# Patient Record
Sex: Female | Born: 1935 | Race: Black or African American | Hispanic: No | Smoking: Never smoker
Health system: Southern US, Community
[De-identification: ages and names within clinical notes are randomized; demographics above are authoritative.]

## PROBLEM LIST (undated history)

## (undated) DIAGNOSIS — I10 Essential (primary) hypertension: Secondary | ICD-10-CM

## (undated) DIAGNOSIS — F0390 Unspecified dementia without behavioral disturbance: Secondary | ICD-10-CM

## (undated) DIAGNOSIS — E785 Hyperlipidemia, unspecified: Secondary | ICD-10-CM

## (undated) HISTORY — PX: CHOLECYSTECTOMY: SHX55

## (undated) HISTORY — PX: APPENDECTOMY: SHX54

---

## 2012-06-15 ENCOUNTER — Inpatient Hospital Stay (HOSPITAL_COMMUNITY)
Admission: EM | Admit: 2012-06-15 | Discharge: 2012-06-23 | DRG: 871 | Disposition: A | Discharge status: Skilled Nursing Facility | Payer: Medicare Other | Attending: Family Medicine | Admitting: Family Medicine

## 2012-06-15 ENCOUNTER — Emergency Department (HOSPITAL_COMMUNITY): Payer: Medicare Other

## 2012-06-15 ENCOUNTER — Encounter (HOSPITAL_COMMUNITY): Payer: Self-pay | Admitting: *Deleted

## 2012-06-15 DIAGNOSIS — E872 Acidosis, unspecified: Secondary | ICD-10-CM | POA: Diagnosis present

## 2012-06-15 DIAGNOSIS — R5381 Other malaise: Secondary | ICD-10-CM | POA: Diagnosis present

## 2012-06-15 DIAGNOSIS — I10 Essential (primary) hypertension: Secondary | ICD-10-CM | POA: Diagnosis present

## 2012-06-15 DIAGNOSIS — R6520 Severe sepsis without septic shock: Secondary | ICD-10-CM | POA: Diagnosis present

## 2012-06-15 DIAGNOSIS — R7881 Bacteremia: Secondary | ICD-10-CM | POA: Diagnosis present

## 2012-06-15 DIAGNOSIS — A419 Sepsis, unspecified organism: Principal | ICD-10-CM | POA: Diagnosis present

## 2012-06-15 DIAGNOSIS — I959 Hypotension, unspecified: Secondary | ICD-10-CM | POA: Diagnosis present

## 2012-06-15 DIAGNOSIS — D696 Thrombocytopenia, unspecified: Secondary | ICD-10-CM | POA: Diagnosis present

## 2012-06-15 DIAGNOSIS — E119 Type 2 diabetes mellitus without complications: Secondary | ICD-10-CM

## 2012-06-15 DIAGNOSIS — N179 Acute kidney failure, unspecified: Secondary | ICD-10-CM | POA: Diagnosis present

## 2012-06-15 DIAGNOSIS — R109 Unspecified abdominal pain: Secondary | ICD-10-CM | POA: Diagnosis present

## 2012-06-15 DIAGNOSIS — D649 Anemia, unspecified: Secondary | ICD-10-CM | POA: Diagnosis present

## 2012-06-15 DIAGNOSIS — N390 Urinary tract infection, site not specified: Secondary | ICD-10-CM | POA: Diagnosis present

## 2012-06-15 DIAGNOSIS — D509 Iron deficiency anemia, unspecified: Secondary | ICD-10-CM | POA: Diagnosis present

## 2012-06-15 DIAGNOSIS — E869 Volume depletion, unspecified: Secondary | ICD-10-CM | POA: Diagnosis present

## 2012-06-15 DIAGNOSIS — E785 Hyperlipidemia, unspecified: Secondary | ICD-10-CM | POA: Diagnosis present

## 2012-06-15 DIAGNOSIS — A498 Other bacterial infections of unspecified site: Secondary | ICD-10-CM | POA: Diagnosis present

## 2012-06-15 DIAGNOSIS — R112 Nausea with vomiting, unspecified: Secondary | ICD-10-CM

## 2012-06-15 DIAGNOSIS — M79609 Pain in unspecified limb: Secondary | ICD-10-CM | POA: Diagnosis present

## 2012-06-15 DIAGNOSIS — E1169 Type 2 diabetes mellitus with other specified complication: Secondary | ICD-10-CM | POA: Diagnosis present

## 2012-06-15 DIAGNOSIS — R7989 Other specified abnormal findings of blood chemistry: Secondary | ICD-10-CM | POA: Diagnosis present

## 2012-06-15 HISTORY — DX: Hyperlipidemia, unspecified: E78.5

## 2012-06-15 HISTORY — DX: Essential (primary) hypertension: I10

## 2012-06-15 LAB — COMPREHENSIVE METABOLIC PANEL
ALT: 253 U/L — ABNORMAL HIGH (ref 0–35)
AST: 539 U/L — ABNORMAL HIGH (ref 0–37)
Albumin: 3.6 g/dL (ref 3.5–5.2)
Alkaline Phosphatase: 187 U/L — ABNORMAL HIGH (ref 39–117)
Calcium: 9.4 mg/dL (ref 8.4–10.5)
GFR calc Af Amer: 25 mL/min — ABNORMAL LOW (ref >90)
Glucose, Bld: 76 mg/dL (ref 70–99)
Potassium: 4.4 mEq/L (ref 3.5–5.1)
Sodium: 136 mEq/L (ref 135–145)
Total Protein: 8.1 g/dL (ref 6.0–8.3)

## 2012-06-15 LAB — URINALYSIS, MICROSCOPIC ONLY
Glucose, UA: NEGATIVE mg/dL
Hgb urine dipstick: NEGATIVE
Protein, ur: NEGATIVE mg/dL
Specific Gravity, Urine: 1.015 (ref 1.005–1.030)

## 2012-06-15 LAB — CBC WITH DIFFERENTIAL/PLATELET
Basophils Absolute: 0 10*3/uL (ref 0.0–0.1)
Basophils Relative: 0 % (ref 0–1)
Eosinophils Absolute: 0.1 10*3/uL (ref 0.0–0.7)
Hemoglobin: 11.6 g/dL — ABNORMAL LOW (ref 12.0–15.0)
Lymphocytes Relative: 5 % — ABNORMAL LOW (ref 12–46)
MCH: 28.9 pg (ref 26.0–34.0)
MCHC: 31.3 g/dL (ref 30.0–36.0)
Monocytes Absolute: 0.2 10*3/uL (ref 0.1–1.0)
Neutrophils Relative %: 91 % — ABNORMAL HIGH (ref 43–77)
Platelets: 130 10*3/uL — ABNORMAL LOW (ref 150–400)

## 2012-06-15 LAB — GLUCOSE, CAPILLARY: Glucose-Capillary: 65 mg/dL — ABNORMAL LOW (ref 70–99)

## 2012-06-15 MED ORDER — SODIUM CHLORIDE 0.9 % IV BOLUS (SEPSIS)
1000.0000 mL | Freq: Once | INTRAVENOUS | Status: AC
  Administered 2012-06-15: 1000 mL via INTRAVENOUS

## 2012-06-15 MED ORDER — ONDANSETRON HCL 4 MG/2ML IJ SOLN
4.0000 mg | Freq: Once | INTRAMUSCULAR | Status: AC
  Administered 2012-06-15: 4 mg via INTRAVENOUS
  Filled 2012-06-15: qty 2

## 2012-06-15 MED ORDER — ACETAMINOPHEN 325 MG PO TABS
650.0000 mg | ORAL_TABLET | Freq: Once | ORAL | Status: AC
  Administered 2012-06-15: 650 mg via ORAL
  Filled 2012-06-15: qty 2

## 2012-06-15 NOTE — ED Notes (Signed)
Provider aware of pt CBG of 65. Pt asymptomatic. Offered PO intake. Refused at this time.

## 2012-06-15 NOTE — ED Notes (Signed)
Pt arrives via [facility] from home with c/o "feeling sick". Reports nausea, vomiting, chills and generalized weakness x 2 hours.

## 2012-06-15 NOTE — ED Notes (Signed)
Repeat cbg 93

## 2012-06-15 NOTE — ED Provider Notes (Signed)
History     CSN: [id number]  Arrival date & time 06/15/12  1956   First MD Initiated Contact with Patient 06/15/12 2009      Chief Complaint  Patient presents with  . Generalized Body Aches    (Consider location/radiation/quality/duration/timing/severity/associated sxs/prior treatment) Patient is a 78 y.o. female presenting with vomiting. The history is provided by the patient.  Emesis  This is a new problem. The current episode started 12 to 24 hours ago. The problem occurs 2 to 4 times per day. The problem has not changed since onset.The emesis has an appearance of stomach contents. There has been no fever. Pertinent negatives include no abdominal pain, no chills, no cough, no diarrhea, no fever, no headaches and no sweats.    Past Medical History  Diagnosis Date  . Diabetes mellitus   . Hypertension     History reviewed. No pertinent past surgical history.  History reviewed. No pertinent family history.  History  Substance Use Topics  . Smoking status: Not on file  . Smokeless tobacco: Not on file  . Alcohol Use:     OB History    Grav Para Term Preterm Abortions TAB SAB Ect Mult Living                  Review of Systems  Constitutional: Negative for fever and chills.  HENT: Positive for rhinorrhea. Negative for congestion.   Respiratory: Positive for shortness of breath (worse with exertion). Negative for cough.   Cardiovascular: Negative for chest pain and leg swelling.  Gastrointestinal: Positive for nausea and vomiting. Negative for abdominal pain, diarrhea and constipation.  Genitourinary: Negative for urgency, decreased urine volume and difficulty urinating.  Musculoskeletal: Negative.  Negative for back pain.  Skin: Negative.   Neurological: Negative for light-headedness and headaches.  Psychiatric/Behavioral: Negative for confusion.  All other systems reviewed and are negative.    Allergies  Review of patient's allergies indicates no known  allergies.  Home Medications  No current outpatient prescriptions on file.  BP 107/59  Temp 98.9 F (37.2 C) (Oral)  Resp 18  SpO2 98%  Physical Exam  Nursing note and vitals reviewed. Constitutional: She is oriented to person, place, and time. She appears well-developed and well-nourished. No distress.  HENT:  Head: Normocephalic and atraumatic.  Right Ear: External ear normal.  Left Ear: External ear normal.  Nose: Nose normal.  Mouth/Throat: Oropharynx is clear and moist.  Neck: Neck supple.  Cardiovascular: Normal rate, regular rhythm, normal heart sounds and intact distal pulses.   Pulmonary/Chest: Effort normal and breath sounds normal. No respiratory distress. She has no wheezes. She has no rales.  Abdominal: Soft. She exhibits no distension. There is no tenderness.  Musculoskeletal: She exhibits no edema and no tenderness.  Lymphadenopathy:    She has no cervical adenopathy.  Neurological: She is alert and oriented to person, place, and time.  Skin: Skin is warm and dry. She is not diaphoretic. No pallor.    ED Course  Procedures (including critical care time)  Labs Reviewed  CBC WITH DIFFERENTIAL - Abnormal; Notable for the following:    Hemoglobin 11.6 (*)     RDW 16.3 (*)     Platelets 130 (*)  PLATELET COUNT CONFIRMED BY SMEAR   Neutrophils Relative 91 (*)     Lymphocytes Relative 5 (*)     Lymphs Abs 0.3 (*)     All other components within normal limits  COMPREHENSIVE METABOLIC PANEL - Abnormal; Notable for the  following:    CO2 17 (*)     Creatinine, Ser 2.07 (*)     AST 539 (*)     ALT 253 (*)     Alkaline Phosphatase 187 (*)     Total Bilirubin 2.1 (*)     GFR calc non Af Amer 21 (*)     GFR calc Af Amer 25 (*)     All other components within normal limits  LIPASE, BLOOD - Abnormal; Notable for the following:    Lipase 88 (*)     All other components within normal limits  URINALYSIS, WITH MICROSCOPIC - Abnormal; Notable for the following:     Color, Urine AMBER (*)  BIOCHEMICALS MAY BE AFFECTED BY COLOR   APPearance CLOUDY (*)     Bilirubin Urine SMALL (*)     Urobilinogen, UA 4.0 (*)     Leukocytes, UA TRACE (*)     Bacteria, UA MANY (*)     All other components within normal limits  GLUCOSE, CAPILLARY - Abnormal; Notable for the following:    Glucose-Capillary 65 (*)     All other components within normal limits  TROPONIN I  GLUCOSE, CAPILLARY   Dg Chest 2 View  06/15/2012  *RADIOLOGY REPORT*  Clinical Data: Generalized body aches  CHEST - 2 VIEW  Comparison: None.  Findings: The lung volumes are low.  There is enlargement of central pulmonary vessels probably as a result of low lung volumes. There is no focal pneumonia or pleural effusion.  The aorta is uncoiled.  The heart size is enlarged.  The soft tissue osseous structures are unremarkable.  IMPRESSION: Low lung volumes.  Cardiomegaly.  No focal pneumonia.  Original Report Authenticated By: Abelardo Diesel, M.D.   US Abdomen Complete  06/16/2012  *RADIOLOGY REPORT*  Clinical Data:  Vomiting and mid epigastric pain.  COMPLETE ABDOMINAL ULTRASOUND  Comparison:  None.  Findings:  Gallbladder:  Surgical absence of the gallbladder.  Common bile duct:  Mild prominence of bile ducts with diameter measured at 9 mm.  This is likely normal in a postoperative patient.  Liver:  Somewhat limited visualization of the liver.  Segmental images are available demonstrating no focal lesion and normal parenchymal echotexture.  IVC:  Appears normal.  Pancreas:  Pancreas is obscured by overlying bowel gas is not well visualized.  Spleen:  Spleen length measures about 11 cm.  Normal parenchymal echotexture.  Right Kidney:  Right kidney measures 11.4 cm length.  No hydronephrosis.  Left Kidney:  Left kidney measures 12 cm length.  No hydronephrosis.  Abdominal aorta:  No aneurysm identified.  IMPRESSION: Somewhat technically limited study due to patient body habitus and bowel gas.  Surgical absence of the  gallbladder.  Mild physiologic dilatation of bile ducts.  No acute process suggested.  Original Report Authenticated By: Neale Burly, M.D.     Date: 06/15/2012  Rate: 106  Rhythm: sinus tachycardia  QRS Axis: normal  Intervals: normal  ST/T Wave abnormalities: normal  Conduction Disutrbances:none  Narrative Interpretation:   Old EKG Reviewed: none available   1. Nausea & vomiting   2. Transaminitis   3. Sepsis       MDM  78 yo female with N/V since this AM and exertional dyspnea. Mild epigastric tenderness, RUQ U/S shows no acute issues. Has elevated LFTs with mild alk phos and bili. Unsure if this bile duct stone, which could cause these symptoms and mild lipase elevation. EKG shows no ischemia and trop is  neg, but with dyspnea cardiac cause is on differential. Will admit to step down due to borderline BP with mild tachycardia. Given fluids in ED, and based on bacteria on U/A with mild temp will give rocephin after cultures.     Sherwood Gambler, MD 06/16/12 [phone]

## 2012-06-15 NOTE — ED Notes (Signed)
Urine specimen obtained with in/out cath using sterile procedure. Pt verbalized understanding of procedure and tolerated well

## 2012-06-15 NOTE — ED Notes (Signed)
Pt to US.

## 2012-06-15 NOTE — ED Notes (Signed)
Pt out to xray.

## 2012-06-16 ENCOUNTER — Inpatient Hospital Stay (HOSPITAL_COMMUNITY): Payer: Medicare Other

## 2012-06-16 ENCOUNTER — Encounter (HOSPITAL_COMMUNITY): Payer: Self-pay | Admitting: Internal Medicine

## 2012-06-16 DIAGNOSIS — E1100 Type 2 diabetes mellitus with hyperosmolarity without nonketotic hyperglycemic-hyperosmolar coma (NKHHC): Secondary | ICD-10-CM | POA: Insufficient documentation

## 2012-06-16 DIAGNOSIS — E869 Volume depletion, unspecified: Secondary | ICD-10-CM | POA: Diagnosis present

## 2012-06-16 DIAGNOSIS — A419 Sepsis, unspecified organism: Secondary | ICD-10-CM | POA: Diagnosis present

## 2012-06-16 DIAGNOSIS — R112 Nausea with vomiting, unspecified: Secondary | ICD-10-CM

## 2012-06-16 DIAGNOSIS — R109 Unspecified abdominal pain: Secondary | ICD-10-CM | POA: Diagnosis present

## 2012-06-16 DIAGNOSIS — E872 Acidosis: Secondary | ICD-10-CM | POA: Diagnosis present

## 2012-06-16 DIAGNOSIS — I10 Essential (primary) hypertension: Secondary | ICD-10-CM | POA: Diagnosis present

## 2012-06-16 DIAGNOSIS — N179 Acute kidney failure, unspecified: Secondary | ICD-10-CM | POA: Diagnosis present

## 2012-06-16 DIAGNOSIS — E785 Hyperlipidemia, unspecified: Secondary | ICD-10-CM | POA: Diagnosis present

## 2012-06-16 DIAGNOSIS — R7989 Other specified abnormal findings of blood chemistry: Secondary | ICD-10-CM

## 2012-06-16 DIAGNOSIS — E119 Type 2 diabetes mellitus without complications: Secondary | ICD-10-CM | POA: Diagnosis present

## 2012-06-16 DIAGNOSIS — I959 Hypotension, unspecified: Secondary | ICD-10-CM | POA: Diagnosis present

## 2012-06-16 DIAGNOSIS — N390 Urinary tract infection, site not specified: Secondary | ICD-10-CM | POA: Diagnosis present

## 2012-06-16 LAB — TROPONIN I: Troponin I: <0.3 ng/mL (ref <0.30)

## 2012-06-16 LAB — COMPREHENSIVE METABOLIC PANEL
ALT: 212 U/L — ABNORMAL HIGH (ref 0–35)
Alkaline Phosphatase: 119 U/L — ABNORMAL HIGH (ref 39–117)
BUN: 23 mg/dL (ref 6–23)
CO2: 20 mEq/L (ref 19–32)
GFR calc Af Amer: 22 mL/min — ABNORMAL LOW (ref >90)
GFR calc non Af Amer: 19 mL/min — ABNORMAL LOW (ref >90)
Glucose, Bld: 60 mg/dL — ABNORMAL LOW (ref 70–99)
Potassium: 4.2 mEq/L (ref 3.5–5.1)
Sodium: 141 mEq/L (ref 135–145)
Total Bilirubin: 2.9 mg/dL — ABNORMAL HIGH (ref 0.3–1.2)
Total Protein: 6.4 g/dL (ref 6.0–8.3)

## 2012-06-16 LAB — GLUCOSE, CAPILLARY
Glucose-Capillary: 132 mg/dL — ABNORMAL HIGH (ref 70–99)
Glucose-Capillary: 58 mg/dL — ABNORMAL LOW (ref 70–99)
Glucose-Capillary: 70 mg/dL (ref 70–99)

## 2012-06-16 LAB — PROTIME-INR
INR: 1.05 (ref 0.00–1.49)
INR: 1.09 (ref 0.00–1.49)
Prothrombin Time: 13.9 seconds (ref 11.6–15.2)
Prothrombin Time: 14.3 seconds (ref 11.6–15.2)

## 2012-06-16 LAB — CBC
HCT: 26.3 % — ABNORMAL LOW (ref 36.0–46.0)
MCHC: 32.7 g/dL (ref 30.0–36.0)
Platelets: 152 10*3/uL (ref 150–400)
RDW: 16.2 % — ABNORMAL HIGH (ref 11.5–15.5)
WBC: 19 10*3/uL — ABNORMAL HIGH (ref 4.0–10.5)

## 2012-06-16 LAB — CARDIAC PANEL(CRET KIN+CKTOT+MB+TROPI)
Relative Index: INVALID (ref 0.0–2.5)
Total CK: 59 U/L (ref 7–177)
Troponin I: <0.3 ng/mL (ref <0.30)

## 2012-06-16 LAB — HEMOGLOBIN A1C
Hgb A1c MFr Bld: 5.6 % (ref <5.7)
Mean Plasma Glucose: 114 mg/dL (ref <117)

## 2012-06-16 LAB — HEPATITIS PANEL, ACUTE: HCV Ab: NEGATIVE

## 2012-06-16 MED ORDER — BIOTENE DRY MOUTH MT LIQD
15.0000 mL | Freq: Two times a day (BID) | OROMUCOSAL | Status: DC
  Administered 2012-06-16 – 2012-06-23 (×14): 15 mL via OROMUCOSAL

## 2012-06-16 MED ORDER — DEXTROSE 5 % IV SOLN
1.0000 g | Freq: Once | INTRAVENOUS | Status: AC
  Administered 2012-06-16: 1 g via INTRAVENOUS
  Filled 2012-06-16: qty 10

## 2012-06-16 MED ORDER — VANCOMYCIN HCL 1000 MG IV SOLR
2000.0000 mg | Freq: Once | INTRAVENOUS | Status: AC
  Administered 2012-06-16: 2000 mg via INTRAVENOUS
  Filled 2012-06-16: qty 2000

## 2012-06-16 MED ORDER — PIPERACILLIN-TAZOBACTAM 3.375 G IVPB
3.3750 g | Freq: Three times a day (TID) | INTRAVENOUS | Status: DC
  Administered 2012-06-16 – 2012-06-17 (×2): 3.375 g via INTRAVENOUS
  Filled 2012-06-16 (×3): qty 50

## 2012-06-16 MED ORDER — INSULIN ASPART 100 UNIT/ML ~~LOC~~ SOLN
0.0000 [IU] | Freq: Three times a day (TID) | SUBCUTANEOUS | Status: DC
  Administered 2012-06-16 – 2012-06-18 (×3): 1 [IU] via SUBCUTANEOUS

## 2012-06-16 MED ORDER — DEXTROSE-NACL 5-0.9 % IV SOLN
INTRAVENOUS | Status: DC
  Administered 2012-06-16 – 2012-06-17 (×2): via INTRAVENOUS
  Administered 2012-06-17: 100 mL/h via INTRAVENOUS
  Administered 2012-06-17: 04:00:00 via INTRAVENOUS
  Administered 2012-06-17 – 2012-06-18 (×2): 75 mL/h via INTRAVENOUS

## 2012-06-16 MED ORDER — SODIUM CHLORIDE 0.9 % IV BOLUS (SEPSIS)
1000.0000 mL | Freq: Once | INTRAVENOUS | Status: AC
  Administered 2012-06-16: 1000 mL via INTRAVENOUS

## 2012-06-16 MED ORDER — PIPERACILLIN-TAZOBACTAM 3.375 G IVPB
3.3750 g | Freq: Once | INTRAVENOUS | Status: AC
  Administered 2012-06-16: 3.375 g via INTRAVENOUS
  Filled 2012-06-16: qty 50

## 2012-06-16 MED ORDER — ONDANSETRON HCL 4 MG PO TABS: 4.0000 mg | ORAL_TABLET | Freq: Four times a day (QID) | ORAL | Status: DC | PRN

## 2012-06-16 MED ORDER — SODIUM CHLORIDE 0.9 % IV BOLUS (SEPSIS)
500.0000 mL | Freq: Once | INTRAVENOUS | Status: AC
  Administered 2012-06-16: 500 mL via INTRAVENOUS

## 2012-06-16 MED ORDER — IOHEXOL 300 MG/ML  SOLN
20.0000 mL | INTRAMUSCULAR | Status: AC
  Administered 2012-06-16 (×2): 20 mL via ORAL

## 2012-06-16 MED ORDER — CHLORHEXIDINE GLUCONATE 0.12 % MT SOLN
15.0000 mL | Freq: Two times a day (BID) | OROMUCOSAL | Status: DC
  Administered 2012-06-16 – 2012-06-19 (×7): 15 mL via OROMUCOSAL
  Filled 2012-06-16 (×10): qty 15

## 2012-06-16 MED ORDER — ONDANSETRON HCL 4 MG/2ML IJ SOLN: 4.0000 mg | Freq: Four times a day (QID) | INTRAMUSCULAR | Status: DC | PRN

## 2012-06-16 MED ORDER — DEXTROSE 5 % IV SOLN
INTRAVENOUS | Status: DC
  Administered 2012-06-16 (×2): via INTRAVENOUS

## 2012-06-16 MED ORDER — VANCOMYCIN HCL 1000 MG IV SOLR
1500.0000 mg | INTRAVENOUS | Status: DC
  Filled 2012-06-16: qty 1500

## 2012-06-16 MED ORDER — SODIUM CHLORIDE 0.9 % IJ SOLN: 3.0000 mL | Freq: Two times a day (BID) | INTRAMUSCULAR | Status: DC

## 2012-06-16 MED ORDER — DEXTROSE 5 % IV SOLN
1.0000 g | INTRAVENOUS | Status: DC
  Filled 2012-06-16: qty 10

## 2012-06-16 MED ORDER — LORAZEPAM 2 MG/ML IJ SOLN
0.5000 mg | Freq: Four times a day (QID) | INTRAMUSCULAR | Status: DC | PRN
  Filled 2012-06-16: qty 1

## 2012-06-16 MED ORDER — SODIUM CHLORIDE 0.9 % IV SOLN
INTRAVENOUS | Status: DC
  Administered 2012-06-16 (×2): via INTRAVENOUS

## 2012-06-16 MED ORDER — MORPHINE SULFATE 2 MG/ML IJ SOLN
1.0000 mg | INTRAMUSCULAR | Status: DC | PRN
  Administered 2012-06-16: 2 mg via INTRAVENOUS
  Filled 2012-06-16: qty 1

## 2012-06-16 MED ORDER — ACETAMINOPHEN 325 MG PO TABS
650.0000 mg | ORAL_TABLET | Freq: Four times a day (QID) | ORAL | Status: DC | PRN
  Administered 2012-06-18: 650 mg via ORAL
  Filled 2012-06-16: qty 2

## 2012-06-16 MED ORDER — PNEUMOCOCCAL 13-VAL CONJ VACC IM SUSP
0.5000 mL | INTRAMUSCULAR | Status: AC
  Administered 2012-06-17: 0.5 mL via INTRAMUSCULAR
  Filled 2012-06-16: qty 0.5

## 2012-06-16 NOTE — H&P (Signed)
[name] [name] is an 78 y.o. female.   Patient seen and examined on June 16, 2012 at 1:53 AM. PCP - patient states she sees a doctor in [place]. Chief Complaint: Nausea vomiting and abdominal pain. HPI: 77 years old female with known history of diabetes mellitus hypertension and hyperlipidemia presents with complaints of nausea vomiting fever and chills with abdominal pain since morning. Patient states she threw up at least 3 times and denies any blood in the vomitus. The pain is around the epigastric and periumbilical area. Pain is dull aching and persistent. Denies any diarrhea. Denies any chest pain but does have mild shortness of breath. In the ER LFTs are found to be elevated and sonogram does not show anything acute. Patient does have history of cholecystectomy. Patient was found to be febrile afebrile and also found to have possible UTI. Patient's lipase is also mildly elevated. Initially the ER patient was found to be mildly hypotensive for which patient has been started on fluids. Patient will be admitted for further management.  Past Medical History  Diagnosis Date  . Diabetes mellitus   . Hypertension   . Hyperlipidemia     Past Surgical History  Procedure Date  . Cholecystectomy   . Appendectomy     History reviewed. No pertinent family history. Social History:  reports that she has never smoked. She does not have any smokeless tobacco history on file. She reports that she does not drink alcohol or use illicit drugs.  Allergies: No Known Allergies   (Not in a hospital admission)  Results for orders placed during the hospital encounter of 06/15/12 (from the past 48 hour(s))  CBC WITH DIFFERENTIAL     Status: Abnormal   Collection Time   06/15/12  8:24 PM      Component Value Range Comment   WBC 6.9  4.0 - 10.5 K/uL WHITE COUNT CONFIRMED ON SMEAR   RBC 4.01  3.87 - 5.11 MIL/uL    Hemoglobin 11.6 (*) 12.0 - 15.0 g/dL    HCT 37.1  36.0 - 46.0 %    MCV 92.5  78.0 - 100.0 fL      MCH 28.9  26.0 - 34.0 pg    MCHC 31.3  30.0 - 36.0 g/dL    RDW 16.3 (*) 11.5 - 15.5 %    Platelets 130 (*) 150 - 400 K/uL PLATELET COUNT CONFIRMED BY SMEAR   Neutrophils Relative 91 (*) 43 - 77 %    Lymphocytes Relative 5 (*) 12 - 46 %    Monocytes Relative 3  3 - 12 %    Eosinophils Relative 1  0 - 5 %    Basophils Relative 0  0 - 1 %    Neutro Abs 6.3  1.7 - 7.7 K/uL    Lymphs Abs 0.3 (*) 0.7 - 4.0 K/uL    Monocytes Absolute 0.2  0.1 - 1.0 K/uL    Eosinophils Absolute 0.1  0.0 - 0.7 K/uL    Basophils Absolute 0.0  0.0 - 0.1 K/uL    Smear Review LARGE PLATELETS PRESENT     COMPREHENSIVE METABOLIC PANEL     Status: Abnormal   Collection Time   06/15/12  8:24 PM      Component Value Range Comment   Sodium 136  135 - 145 mEq/L    Potassium 4.4  3.5 - 5.1 mEq/L    Chloride 106  96 - 112 mEq/L    CO2 17 (*) 19 - 32 mEq/L  Glucose, Bld 76  70 - 99 mg/dL    BUN 20  6 - 23 mg/dL    Creatinine, Ser 2.07 (*) 0.50 - 1.10 mg/dL    Calcium 9.4  8.4 - 10.5 mg/dL    Total Protein 8.1  6.0 - 8.3 g/dL    Albumin 3.6  3.5 - 5.2 g/dL    AST 539 (*) 0 - 37 U/L    ALT 253 (*) 0 - 35 U/L    Alkaline Phosphatase 187 (*) 39 - 117 U/L    Total Bilirubin 2.1 (*) 0.3 - 1.2 mg/dL    GFR calc non Af Amer 21 (*) >90 mL/min    GFR calc Af Amer 25 (*) >90 mL/min   TROPONIN I     Status: Normal   Collection Time   06/15/12  8:24 PM      Component Value Range Comment   Troponin I <0.30  <0.30 ng/mL   LIPASE, BLOOD     Status: Abnormal   Collection Time   06/15/12  8:24 PM      Component Value Range Comment   Lipase 88 (*) 11 - 59 U/L   GLUCOSE, CAPILLARY     Status: Abnormal   Collection Time   06/15/12  8:54 PM      Component Value Range Comment   Glucose-Capillary 65 (*) 70 - 99 mg/dL    Comment 1 Notify RN     GLUCOSE, CAPILLARY     Status: Normal   Collection Time   06/15/12  9:31 PM      Component Value Range Comment   Glucose-Capillary 93  70 - 99 mg/dL   URINALYSIS, WITH MICROSCOPIC      Status: Abnormal   Collection Time   06/15/12 10:32 PM      Component Value Range Comment   Color, Urine AMBER (*) YELLOW BIOCHEMICALS MAY BE AFFECTED BY COLOR   APPearance CLOUDY (*) CLEAR    Specific Gravity, Urine 1.015  1.005 - 1.030    pH 5.0  5.0 - 8.0    Glucose, UA NEGATIVE  NEGATIVE mg/dL    Hgb urine dipstick NEGATIVE  NEGATIVE    Bilirubin Urine SMALL (*) NEGATIVE    Ketones, ur NEGATIVE  NEGATIVE mg/dL    Protein, ur NEGATIVE  NEGATIVE mg/dL    Urobilinogen, UA 4.0 (*) 0.0 - 1.0 mg/dL    Nitrite NEGATIVE  NEGATIVE    Leukocytes, UA TRACE (*) NEGATIVE    WBC, UA 3-6  <3 WBC/hpf    RBC / HPF 0-2  <3 RBC/hpf    Bacteria, UA MANY (*) RARE    Squamous Epithelial / LPF RARE  RARE   TROPONIN I     Status: Normal   Collection Time   06/15/12 11:59 PM      Component Value Range Comment   Troponin I <0.30  <0.30 ng/mL   LACTIC ACID, PLASMA     Status: Normal   Collection Time   06/16/12  1:03 AM      Component Value Range Comment   Lactic Acid, Venous 2.1  0.5 - 2.2 mmol/L   PROTIME-INR     Status: Normal   Collection Time   06/16/12  1:03 AM      Component Value Range Comment   Prothrombin Time 13.9  11.6 - 15.2 seconds    INR 1.05  0.00 - 1.49    Dg Chest 2 View  06/15/2012  *RADIOLOGY REPORT*  Clinical Data:  Generalized body aches  CHEST - 2 VIEW  Comparison: None.  Findings: The lung volumes are low.  There is enlargement of central pulmonary vessels probably as a result of low lung volumes. There is no focal pneumonia or pleural effusion.  The aorta is uncoiled.  The heart size is enlarged.  The soft tissue osseous structures are unremarkable.  IMPRESSION: Low lung volumes.  Cardiomegaly.  No focal pneumonia.  Original Report Authenticated By: Abelardo Diesel, M.D.   US Abdomen Complete  06/16/2012  *RADIOLOGY REPORT*  Clinical Data:  Vomiting and mid epigastric pain.  COMPLETE ABDOMINAL ULTRASOUND  Comparison:  None.  Findings:  Gallbladder:  Surgical absence of the gallbladder.   Common bile duct:  Mild prominence of bile ducts with diameter measured at 9 mm.  This is likely normal in a postoperative patient.  Liver:  Somewhat limited visualization of the liver.  Segmental images are available demonstrating no focal lesion and normal parenchymal echotexture.  IVC:  Appears normal.  Pancreas:  Pancreas is obscured by overlying bowel gas is not well visualized.  Spleen:  Spleen length measures about 11 cm.  Normal parenchymal echotexture.  Right Kidney:  Right kidney measures 11.4 cm length.  No hydronephrosis.  Left Kidney:  Left kidney measures 12 cm length.  No hydronephrosis.  Abdominal aorta:  No aneurysm identified.  IMPRESSION: Somewhat technically limited study due to patient body habitus and bowel gas.  Surgical absence of the gallbladder.  Mild physiologic dilatation of bile ducts.  No acute process suggested.  Original Report Authenticated By: Neale Burly, M.D.    Review of Systems  Constitutional: Positive for fever and chills.  HENT: Negative.   Eyes: Negative.   Respiratory: Positive for shortness of breath.   Cardiovascular: Negative.   Gastrointestinal: Positive for nausea, vomiting and abdominal pain.  Genitourinary: Negative.   Musculoskeletal: Negative.   Skin: Negative.   Neurological: Negative.   Endo/Heme/Allergies: Negative.   Psychiatric/Behavioral: Negative.     Blood pressure 92/63, pulse 102, temperature 99.6 F (37.6 C), temperature source Oral, resp. rate 2, SpO2 93.00%. Physical Exam  Constitutional: She is oriented to person, place, and time. She appears well-developed and well-nourished. No distress.  HENT:  Head: Normocephalic and atraumatic.  Right Ear: External ear normal.  Left Ear: External ear normal.  Nose: Nose normal.  Mouth/Throat: Oropharynx is clear and moist. No oropharyngeal exudate.  Eyes: Conjunctivae are normal. Pupils are equal, round, and reactive to light. Right eye exhibits no discharge. Left eye exhibits  no discharge. No scleral icterus.  Neck: Normal range of motion. Neck supple.  Cardiovascular: Normal rate and regular rhythm.   Respiratory: Effort normal and breath sounds normal. No respiratory distress. She has no wheezes. She has no rales.  GI: Soft. Bowel sounds are normal. She exhibits no distension. There is no tenderness. There is no rebound.  Musculoskeletal: Normal range of motion. She exhibits no edema and no tenderness.  Neurological: She is alert and oriented to person, place, and time.       Moves all extremities.  Skin: Skin is warm and dry. She is not diaphoretic.  Psychiatric: Her behavior is normal.     Assessment/Plan #1. Abdominal pain or nausea vomiting and elevated LFTs and lipase, possible gallstone pancreatitis - at this time I have ordered CT abdomen pelvis. Keep patient n.p.o. Consult GI in a.m. for further recommendations. Patient does have markedly elevated LFTs. I have ordered in addition acute hepatitis panel. Discontinue statins. Tylenol levels are  normal. #2. Acute renal failure - baseline creatinine is not known. Closely follow intake output and metabolic panel. Renal failure is probably from dehydration. UA does not show any casts. #3. Possible UTI - patient is placed on ceftriaxone. #4. Hypotension - probably from dehydration. Hydrate aggressively. #5. Diabetes mellitus2 - patient will be on sliding-scale coverage. Check CBG every 4 hourly while n.p.o. #6. Thrombocytopenia - closely follow CBC. Any worsening any further studies. I have ordered peripheral smear study for now given renal failure and mild thrombocytopenia to check for schistocytes.  CODE STATUS - full code.  Khamari Sheehan N. 06/16/2012, 1:53 AM

## 2012-06-16 NOTE — Progress Notes (Signed)
ANTIBIOTIC CONSULT NOTE - INITIAL  Pharmacy Consult for vancomycin and zosyn Indication: UTI, bacteremia  No Known Allergies  Patient Measurements: Height: 5\' 10"  (177.8 cm) Weight: 250 lb 7.1 oz (113.6 kg) IBW/kg (Calculated) : 68.5  Adjusted Body Weight: 83 kg  Vital Signs: Temp: 97.9 F (36.6 C) (08/07 1600) Temp src: Oral (08/07 1600) BP: 102/58 mmHg (08/07 1930) Pulse Rate: 72  (08/07 2000) Intake/Output from previous day: 08/06 0701 - 08/07 0700 In: 50 [IV Piggyback:50] Out: -  Intake/Output from this shift:    Labs:  [facility] 06/16/12 1129 06/15/12 2024  WBC 19.0* 6.9  HGB 8.6* 11.6*  PLT 152 130*  LABCREA -- --  CREATININE 2.27* 2.07*   Estimated Creatinine Clearance: 27 ml/min (by C-G formula based on Cr of 2.27). No results found for this basename: VANCOTROUGH:2,VANCOPEAK:2,VANCORANDOM:2,GENTTROUGH:2,GENTPEAK:2,GENTRANDOM:2,TOBRATROUGH:2,TOBRAPEAK:2,TOBRARND:2,AMIKACINPEAK:2,AMIKACINTROU:2,AMIKACIN:2, in the last 72 hours   Microbiology: Recent Results (from the past 720 hour(s))  URINE CULTURE     Status: Normal (Preliminary result)   Collection Time   06/15/12 10:32 PM      Component Value Range Status Comment   Specimen Description URINE, RANDOM   Final    Special Requests NONE   Final    Culture  Setup Time 06/16/2012 02:38   Final    Colony Count >=100,000 COLONIES/ML   Final    Culture ESCHERICHIA COLI   Final    Report Status PENDING   Incomplete   CULTURE, BLOOD (ROUTINE X 2)     Status: Normal (Preliminary result)   Collection Time   06/16/12 12:45 AM      Component Value Range Status Comment   Specimen Description BLOOD LEFT ANTECUBITAL   Final    Special Requests BOTTLES DRAWN AEROBIC AND ANAEROBIC 5CC   Final    Culture  Setup Time 06/16/2012 08:55   Final    Culture     Final    Value: GRAM POSITIVE COCCI IN PAIRS AND CHAINS     GRAM NEGATIVE RODS     Note: Gram Stain Report Called to,Read Back By and Verified With: BRIAN @ 2010 ON 06/16/12  BY GOLLD   Report Status PENDING   Incomplete   CULTURE, BLOOD (ROUTINE X 2)     Status: Normal (Preliminary result)   Collection Time   06/16/12 12:55 AM      Component Value Range Status Comment   Specimen Description BLOOD LEFT HAND   Final    Special Requests BOTTLES DRAWN AEROBIC AND ANAEROBIC 3CC   Final    Culture  Setup Time 06/16/2012 08:55   Final    Culture     Final    Value: GRAM POSITIVE COCCI IN PAIRS AND CHAINS     GRAM NEGATIVE RODS     Note: Gram Stain Report Called to,Read Back By and Verified With: BRIAN @ 2010 ON 06/16/12 BY GOLLD   Report Status PENDING   Incomplete   MRSA PCR SCREENING     Status: Normal   Collection Time   06/16/12  6:15 AM      Component Value Range Status Comment   MRSA by PCR NEGATIVE  NEGATIVE Final     Medical History: Past Medical History  Diagnosis Date  . Diabetes mellitus   . Hypertension   . Hyperlipidemia     Medications:  Prescriptions prior to admission  Medication Sig Dispense Refill  . atorvastatin (LIPITOR) 10 MG tablet Take 10 mg by mouth daily.      Marland Kitchen escitalopram (LEXAPRO)  20 MG tablet Take 20 mg by mouth daily.      Marland Kitchen glipiZIDE (GLUCOTROL XL) 5 MG 24 hr tablet Take 5 mg by mouth daily.      Marland Kitchen HYDROcodone-acetaminophen (VICODIN) 5-500 MG per tablet Take 2 tablets by mouth every 4 (four) hours as needed. pain      . LORazepam (ATIVAN) 0.5 MG tablet Take 0.25 mg by mouth 2 (two) times daily as needed. anxiety      . metoprolol (LOPRESSOR) 50 MG tablet Take 50 mg by mouth 2 (two) times daily.      . pantoprazole (PROTONIX) 40 MG tablet Take 40 mg by mouth daily.       Assessment: 78 yo lady who presents to ED with c/o nasusea, vomiting, fever and chills to start broad spectrum antibiotics for UTI, bacteremia with gpc and gnr.  Goal of Therapy:  Vancomycin trough level 15-20 mcg/ml  Plan:  Zosyn 3.375 gm IV q8 hours, each dose infused over 4 hours. Vancomycin 2 gm load then 1500 mg IV q48 hours. Will f/u cultures,  renal function and clinical course. Check vanc trough when appropriate.   Excell Seltzer Poteet 06/16/2012,8:44 PM

## 2012-06-16 NOTE — ED Provider Notes (Signed)
I saw and evaluated the patient, reviewed the resident's note and I agree with the findings and plan.  Pt with epigastric pain, n/v and some exertional SOB. Pt has abnormal transaminases. [place] is negative, but can have choledocholithiatic and might require further imaging.  SOB is new, could be anginal equivalent. Pt also spiked a fever during the course of stay. 3 SIRS criteria - will give zosyn and ceftriaxone - unknown source at this time. Admit.   Varney Biles, MD 06/16/12 [phone]

## 2012-06-16 NOTE — ED Notes (Signed)
Antibiotics held at this time pending blood cultures being obtained

## 2012-06-16 NOTE — ED Notes (Signed)
Report to 2100. Denies further question

## 2012-06-16 NOTE — ED Notes (Signed)
Pt returns from [place]. On cardiac monitor. Lab at bedside

## 2012-06-16 NOTE — Progress Notes (Signed)
[facility] Progress Note [place] TEAM 1 - Stepdown/ICU TEAM   Skyla Speegle [id number] DOB: 1929-12-28 DOA: 06/15/2012 PCP: William Hamburger, MD  Brief narrative: 78 year old female patient with a history of diabetes, hypertension and dyslipidemia. Presented to the emergency department with complaints of nausea, vomiting, fever and chills. This was associated with abdominal pain in the epigastric and periumbilical regions. She described this pain as being dull aching quality and persistent. She endorsed that she had vomited at least 3 times prior to admission but did not notice any blood in the vomitus. In the emergency department she was found to have transaminitis and ultrasound showed no acute process. Patient has prior history of cholecystectomy. Urinalysis was consistent with a probable UTI and patient was febrile at presentation and although she did not have leukocytosis she had a left shift with neutrophils 91%. In addition she had acute renal failure and metabolic acidosis and also was found to have thrombocytopenia. She normally receives her medical care in [facility] and old laboratory data for comparison was not available at time of admission. Because her systolic blood pressure was less than 90 patient had been given multiple fluid challenges and evening physician started her on high flow IV fluids. Because of her hemodynamic instability and concerns over evolving sepsis process she was admitted to the step down unit for further evaluation and treatment  Assessment/Plan:  SIRS (systemic inflammatory response syndrome) *Currently continue supportive care and treat underlying infectious processes *At this point suspect primary source of infection is urinary tract * Follow up on urine and blood cultures  Hypotension/Volume depletion *Has received 3000 cc of fluid since presentation to the emergency department. Blood pressure remains low so we'll repeat 500 cc saline bolus this  morning *MAP is less than 65 but we'll continue to hydrate before considering pressor agents especially since patient is maintaining adequate urinary output and is mentating appropriately *Continue normal saline but decreased to 100 cc per hour since adding dextrose infusion for hypoglycemia at 50 cc per hour  UTI (lower urinary tract infection) *Continue empiric Rocephin *Followup on cultures  Lactic acidosis/Elevated LFTs *Suspect related to systemic inflammatory response and acute bacterial infection-Procalcitonin elevated greater than 8 *Also suspect related to hypotension and subsequent low organ perfusion *Continue to follow trends-we'll proceed with checking comprehensive metabolic panel today since last checked yesterday evening prior to admission  Nausea and vomiting/mildly elevated serum lipase *Suspect nausea and vomiting precipitated by urinary tract infection since abdominal exam is benign except for mild suprapubic tenderness *Suspect elevated serum lipase were repeated episodes of emesis given left upper quadrant nontender to palpation  ARF (acute renal failure) *Baseline creatinine unknown, creatinine at presentation 2.0 *Continue hydration and avoid nephrotoxic medication *Last electrolyte panel checked yesterday evening at 8 PM so we'll repeat now  Diabetes mellitus type 2, controlled/hypoglycemia *CBGs actually on the low range of normal-we'll go ahead and add a separate dextrose infusion at 50 cc per hour and follow CBGs *Was on glyburide prior to admission but will hold for now and instead utilize short-acting sliding scale insulin  Abdominal pain *Now is focally located in the suprapubic area and as mentioned above abdominal exam is benign suspect primary etiology related to urinary tract infection  Hyperlipidemia  HTN (hypertension) *On Lopressor prior to admission but due to current hypotension this medication is on hold  Social concerns *Apparently EMS  personnel concerned over patient's preadmission living situation. They reported that when they went to pick up the patient at the home  they found the patient in an unkempt state and she smelled of urine. In addition when they entered the home there was a strong smell of marijuana smoke. *Consult social worker-may need to involve APS  DVT prophylaxis: SCDs Code Status: Full Family Communication: Communicated directly with patient. Disposition Plan: Remain in step down.  Consultants: None  Procedures: None  Antibiotics: Zosyn IV in emergency department Rocephin IV 8/6 >>>  HPI/Subjective: Patient alert and endorses feeling jittery and nervous and somewhat anxious. Endorses only mild suprapubic abdominal pain. Endorses previous nausea and vomiting have resolved. Denies chest pain, shortness of breath or headache.   Objective: Blood pressure 95/50, pulse 80, temperature 99.7 F (37.6 C), temperature source Oral, resp. rate 19, height 5\' 10"  (1.778 m), weight 113.6 kg (250 lb 7.1 oz), SpO2 99.00%.  Intake/Output Summary (Last 24 hours) at 06/16/12 1109 Last data filed at 06/16/12 0155  Gross per 24 hour  Intake     50 ml  Output      0 ml  Net     50 ml     Exam: Followup exam completed  Data Reviewed: Basic Metabolic Panel:  Lab 123XX123 2024  NA 136  K 4.4  CL 106  CO2 17*  GLUCOSE 76  BUN 20  CREATININE 2.07*  CALCIUM 9.4  MG --  PHOS --   Liver Function Tests:  Lab 06/15/12 2024  AST 539*  ALT 253*  ALKPHOS 187*  BILITOT 2.1*  PROT 8.1  ALBUMIN 3.6    Lab 06/15/12 2024  LIPASE 88*  AMYLASE --   CBC:  Lab 06/15/12 2024  WBC 6.9  NEUTROABS 6.3  HGB 11.6*  HCT 37.1  MCV 92.5  PLT 130*   Cardiac Enzymes:  Lab 06/16/12 0103 06/15/12 2359 06/15/12 2024  CKTOTAL 59 -- --  CKMB 1.1 -- --  CKMBINDEX -- -- --  TROPONINI <0.30 <0.30 <0.30   CBG:  Lab 06/16/12 0733 06/16/12 0530 06/15/12 2131 06/15/12 2054  GLUCAP 144* 132* 93 65*     Recent Results (from the past 240 hour(s))  MRSA PCR SCREENING     Status: Normal   Collection Time   06/16/12  6:15 AM      Component Value Range Status Comment   MRSA by PCR NEGATIVE  NEGATIVE Final      Studies:  Recent x-ray studies have been reviewed in detail by the Attending Physician  Scheduled Meds:  Reviewed in detail by the Attending Physician   Erin Hearing, [place] [facility] Office  [phone] Pager [phone]  On-Call/Text Page:      [email or web address]      password [email or web address]  If 7PM-7AM, please contact night-coverage [email or web address] Password [email or web address] 06/16/2012, 11:09 AM   LOS: 1 day   I have personally examined this patient and reviewed the entire database. I have reviewed the above note, made any necessary editorial changes, and agree with its content.  Cherene Altes, MD [facility]

## 2012-06-17 DIAGNOSIS — R7881 Bacteremia: Secondary | ICD-10-CM | POA: Diagnosis present

## 2012-06-17 DIAGNOSIS — E119 Type 2 diabetes mellitus without complications: Secondary | ICD-10-CM

## 2012-06-17 DIAGNOSIS — A419 Sepsis, unspecified organism: Secondary | ICD-10-CM

## 2012-06-17 DIAGNOSIS — I517 Cardiomegaly: Secondary | ICD-10-CM

## 2012-06-17 LAB — CBC
MCV: 90.9 fL (ref 78.0–100.0)
Platelets: 133 10*3/uL — ABNORMAL LOW (ref 150–400)
RBC: 2.98 MIL/uL — ABNORMAL LOW (ref 3.87–5.11)
WBC: 13.9 10*3/uL — ABNORMAL HIGH (ref 4.0–10.5)

## 2012-06-17 LAB — URINE CULTURE: Colony Count: >=100000

## 2012-06-17 LAB — COMPREHENSIVE METABOLIC PANEL
Albumin: 2.5 g/dL — ABNORMAL LOW (ref 3.5–5.2)
BUN: 19 mg/dL (ref 6–23)
Chloride: 111 mEq/L (ref 96–112)
Creatinine, Ser: 2.1 mg/dL — ABNORMAL HIGH (ref 0.50–1.10)
Total Bilirubin: 1.7 mg/dL — ABNORMAL HIGH (ref 0.3–1.2)

## 2012-06-17 LAB — GLUCOSE, CAPILLARY
Glucose-Capillary: 122 mg/dL — ABNORMAL HIGH (ref 70–99)
Glucose-Capillary: 72 mg/dL (ref 70–99)
Glucose-Capillary: 93 mg/dL (ref 70–99)

## 2012-06-17 MED ORDER — PANTOPRAZOLE SODIUM 40 MG PO TBEC
40.0000 mg | DELAYED_RELEASE_TABLET | Freq: Every day | ORAL | Status: DC
  Administered 2012-06-17 – 2012-06-23 (×7): 40 mg via ORAL
  Filled 2012-06-17 (×5): qty 1

## 2012-06-17 MED ORDER — ESCITALOPRAM OXALATE 20 MG PO TABS
20.0000 mg | ORAL_TABLET | Freq: Every day | ORAL | Status: DC
  Administered 2012-06-17 – 2012-06-23 (×7): 20 mg via ORAL
  Filled 2012-06-17 (×7): qty 1

## 2012-06-17 MED ORDER — LORAZEPAM 0.5 MG PO TABS
0.2500 mg | ORAL_TABLET | Freq: Two times a day (BID) | ORAL | Status: DC | PRN
  Administered 2012-06-18 – 2012-06-22 (×8): 0.25 mg via ORAL
  Filled 2012-06-17 (×9): qty 1

## 2012-06-17 MED ORDER — MORPHINE SULFATE 2 MG/ML IJ SOLN
1.0000 mg | INTRAMUSCULAR | Status: DC | PRN
  Administered 2012-06-23 (×2): 2 mg via INTRAVENOUS
  Filled 2012-06-17 (×2): qty 1

## 2012-06-17 MED ORDER — ATORVASTATIN CALCIUM 10 MG PO TABS
10.0000 mg | ORAL_TABLET | Freq: Every day | ORAL | Status: DC
  Administered 2012-06-17 – 2012-06-23 (×7): 10 mg via ORAL
  Filled 2012-06-17 (×7): qty 1

## 2012-06-17 MED ORDER — PIPERACILLIN-TAZOBACTAM 3.375 G IVPB
3.3750 g | Freq: Three times a day (TID) | INTRAVENOUS | Status: DC
  Administered 2012-06-17 – 2012-06-18 (×3): 3.375 g via INTRAVENOUS
  Filled 2012-06-17 (×8): qty 50

## 2012-06-17 MED ORDER — HYDROCODONE-ACETAMINOPHEN 5-325 MG PO TABS
1.0000 | ORAL_TABLET | ORAL | Status: DC | PRN
  Administered 2012-06-18 – 2012-06-22 (×8): 1 via ORAL
  Filled 2012-06-17 (×9): qty 1

## 2012-06-17 NOTE — Progress Notes (Signed)
Echocardiogram 2D Echocardiogram has been performed.  [name] [name] 06/17/2012, 10:41 AM

## 2012-06-17 NOTE — Care Management Note (Signed)
  Page 1 of 1   06/17/2012     2:51:30 PM   CARE MANAGEMENT NOTE 06/17/2012  Patient:  [name] [name],[name] [name]   Account Number:  [id number]  Date Initiated:  06/16/2012  Documentation initiated by:  Luz Lex  Subjective/Objective Assessment:   SIRS -  Has daughter.     DC Planning Services  CM consult      Comments:  ContactMarland Kitchen  Janyth Contes Daughter [phone]  06/17/12 Cole MSN CCM Daughter, Percival Spanish, lives with pt and is primary caregiver.  PCP is Dr. Beckie Salts with [facility].  Pt is active with [facility] for RN, aide, PT, and OT.  DSS caseworker is assisting with completion of paperwork for [facility] PCS through [place].  [place] liaison notified of admission.

## 2012-06-17 NOTE — Clinical Documentation Improvement (Signed)
SEPSIS DOCUMENTATION QUERY  THIS DOCUMENT IS NOT A PERMANENT PART OF THE MEDICAL RECORD  TO RESPOND TO THE THIS QUERY, FOLLOW THE INSTRUCTIONS BELOW:  1. If needed, update documentation for the patient's encounter via the notes activity.  2. Access this query again and click edit on the [facility] [place].  3. After updating, or not, click F2 to complete all highlighted (required) fields concerning your review. Select "additional documentation in the medical record" OR "no additional documentation provided".  4. Click Sign note button.  5. The deficiency will fall out of your In Basket *Please let us know if you are not able to complete this workflow by phone or e-mail (listed below).  Please update your documentation within the medical record to reflect your response to this query.                                                                                    06/17/12  Dear Dr. Ebony Hail [facility],  In a better effort to capture your patient's severity of illness, reflect appropriate length of stay and utilization of resources, a review of the patient medical record has revealed the following indicators. Based on your clinical judgment, please clarify and document in a progress note and/or discharge summary the clinical condition associated with the following supporting information:In responding to this query please exercise your independent judgment.  The fact that a query is asked, does not imply that any particular answer is desired or expected.   Possible Clinical Conditions? Severe Sepsis Septic Shock Sepsis with UTI Other Condition  Cannot clinically Determine  Risk Factors: Clinical Information:  Risk Factors: chronic illness: DM; HTN  Presenting S&S: n/v; exertional dyspnea VS: Temp=100.7; 99.7; 98.5 RR=29, 27, 30, 26, 24, 26 P=113, 114, 109 BP=87/58; 90/49; 93/48; 95/50; 78/43; 96/49; 92/48; 98/55  Diagnostics: Lab: (WBC: 19, Neuts: 91; lactic acid: 2.1,  urine: many bacteria)   Cultures: Blood: GRAM POSITIVE COCCI IN PAIRS AND CHAINS GRAM NEGATIVE RODS Urine: >=100,000 COLONIES/ML Culture ESCHERICHIA COLI   Radiology:Low lung volumes. Cardiomegaly. No focal pneumonia  Treatment: Dextrose at 100; NSS bolus, NSS at 100; zosyn, vancomycin, rocephin    Reviewed: additional documentation in the medical record  Thank You,  Era Bumpers, BSN, CCM   Clinical Documentation Specialist: [email or web address] [place]

## 2012-06-17 NOTE — Progress Notes (Addendum)
Pt transferred from 2100 at approximately 1120 on room air via wheelchair.  Pt demonstrated fair toleration during ambulation from wheelchair to bed; weakness and limited movement legs bilaterally.  Pt has a pain level of 0. Vital signs upon arrival in unit; HR 82, Ox 100% on RA, RR 22, BP 120/58.  Pt daughter was called and has been informed of new room 2601.

## 2012-06-17 NOTE — Progress Notes (Signed)
[facility] Progress Note  TEAM 1 - Stepdown/ICU TEAM   [name] [name] [id number] DOB: 1930-06-10 DOA: 06/15/2012 PCP: William Hamburger, MD  Brief narrative: 78 year old female patient with a history of diabetes, hypertension and dyslipidemia. Presented to the emergency department with complaints of nausea, vomiting, fever and chills. This was associated with abdominal pain in the epigastric and periumbilical regions. She described this pain as being dull aching quality and persistent. She endorsed that she had vomited at least 3 times prior to admission but did not notice any blood in the vomitus. In the emergency department she was found to have transaminitis and ultrasound showed no acute process. Patient has prior history of cholecystectomy. Urinalysis was consistent with a probable UTI and patient was febrile at presentation and although she did not have leukocytosis she had a left shift with neutrophils 91%. In addition she had acute renal failure and metabolic acidosis and also was found to have thrombocytopenia. She normally receives her medical care in [facility] and old laboratory data for comparison was not available at time of admission. Because her systolic blood pressure was less than 90 patient had been given multiple fluid challenges and evening physician started her on high flow IV fluids. Because of her hemodynamic instability and concerns over evolving sepsis process she was admitted to the step down unit for further evaluation and treatment  Assessment/Plan:  SIRS (systemic inflammatory response syndrome) *Currently continue supportive care and treat underlying infectious processes *At this point suspect primary source of infection is urinary tract *Urine culture reveals Escherichia coli infection and both bottles of blood cultures revealed gram-negative rods as well as gram-positive cocci  Gram negative rod and gram-positive cocci bacteremia *Suspect gram-negative  rods will be the same as urinary tract organism *Since has gram-positive cocci x2 bottles will check 2-D echocardiogram to ensure no infective endocarditis  UPDATE: 2-D echo completed 06/17/2012. Technically difficult study and unable to eliminate possibility of vegetations. May need to consult ID for possible recommendations regarding TEE. We'll wait until final cultures are back regarding the gram-positive cocci and if truly Staphylococcus then we'll pursue ID consultation.  Hypotension/Volume depletion *Has received >3000 cc of fluid since presentation to the emergency department.  *Blood pressure has been consistently greater than 90 systolic for the past 18 hours *Continue IV fluids but decrease to 100 cc per hour to avoid inadvertent volume overload  Escherichia coli UTI (lower urinary tract infection) *Continue empiric Rocephin *Followup on cultures-sensitivities are pending  Lactic acidosis/Elevated LFTs *Improving *Suspect related to systemic inflammatory response and acute bacterial infection-Procalcitonin elevated greater than 8 *Also suspect related to hypotension and subsequent low organ perfusion *Continue to follow trends-we'll proceed with checking comprehensive metabolic panel today since last checked yesterday evening prior to admission  Nausea and vomiting/mildly elevated serum lipase *Resolved *Suspect nausea and vomiting precipitated by urinary tract infection since abdominal exam is benign except for mild suprapubic tenderness *Suspect elevated serum lipase were repeated episodes of emesis given left upper quadrant nontender to palpation  ARF (acute renal failure)/metabolic acidosis *Baseline creatinine unknown, creatinine at presentation 2.0 and creatinine is trending downward subtly with concurrent decrease in BUN *Continue hydration and avoid nephrotoxic medication  Diabetes mellitus type 2, controlled/hypoglycemia *CBGs actually on the low range of normal so we  need to continue dextrose component of IV fluids *Has been started on diet so watch for hyperglycemia if recurs can discontinue dextrose component of IV fluids *Was on glyburide prior to admission but will hold for now and  instead utilize short-acting sliding scale insulin  Abdominal pain *Resolved *Was focally located in the suprapubic area and as mentioned above abdominal exam is benign suspect primary etiology related to urinary tract infection  Hyperlipidemia  HTN (hypertension) *On Lopressor prior to admission but due to current hypotension this medication is on hold *Echocardiogram this admission demonstrates preserved LV function but does have parameters consistent with grade 1 diastolic dysfunction  Chronic extremity pain *Patient endorses wrist and knee pain on the left. Was clarified as chronic and she takes Vicodin at home *On exam no evidence of erythema or any other findings concerning for seeding of bacteremia to joint but will need to follow closely. Patient is a poor historian  Social concerns *Apparently EMS personnel concerned over patient's preadmission living situation. They reported that when they went to pick up the patient at the home they found the patient in an unkempt state and she smelled of urine. In addition when they entered the home there was a strong smell of marijuana smoke. *Consult social worker-may need to involve APS  DVT prophylaxis: SCDs Code Status: Full Family Communication: Communicated directly with patient. Disposition Plan: Remain in step down.  Consultants: None  Procedures: None  Antibiotics: Zosyn IV in emergency department Rocephin IV 8/6 >>>  HPI/Subjective: During examination patient had just completed bath and was sitting upright in chair. Now complaining of excessive fatigue related to bath. Denied chest pain or shortness of breath. Reports would like to get back to bed.   Objective: Blood pressure 119/62, pulse 87,  temperature 98.4 F (36.9 C), temperature source Oral, resp. rate 23, height 5\' 10"  (A999333 m), weight 116.3 kg (256 lb 6.3 oz), SpO2 99.00%.  Intake/Output Summary (Last 24 hours) at 06/17/12 1128 Last data filed at 06/17/12 0700  Gross per 24 hour  Intake   3460 ml  Output      0 ml  Net   3460 ml     Exam: Gen.: Alert, no acute distress, appears much younger than stated age Neurological: Patient is alert and oriented to name and place although seems to have some difficulty and reporting historical facts and symptoms Lungs: Bilateral lung sounds are clear to auscultation posteriorly, respiratory effort is nonlabored and 90, she is on room air maintaining saturations of 100% Cardiovascular: Heart sounds S1-S2 without rubs murmurs rubs or gallops, sinus rhythm, no peripheral edema, IV fluids at 150 cc per hour Abdomen: Obese but soft and nontender, bowel sounds present, tolerating solid diet so far Musculoskeletal: Extremities are symmetrical in appearance except for mild swelling of left wrist and hand as well as left knee which is not consistent with joint effusion and there is no erythema noted, no crepitus with passive range of motion and no pain to palpation  Data Reviewed: Basic Metabolic Panel:  Lab [id number] 0506 06/16/12 1129 06/15/12 2024  NA 138 141 136  K 3.9 4.2 4.4  CL 111 112 106  CO2 18* 20 17*  GLUCOSE 91 60* 76  BUN 19 23 20   CREATININE 2.10* 2.27* 2.07*  CALCIUM 8.2* 8.3* 9.4  MG -- -- --  PHOS -- -- --   Liver Function Tests:  Lab 06/17/12 0506 06/16/12 1129 06/15/12 2024  AST 120* 238* 539*  ALT 162* 212* 253*  ALKPHOS 108 119* 187*  BILITOT 1.7* 2.9* 2.1*  PROT 6.0 6.4 8.1  ALBUMIN 2.5* 2.7* 3.6    Lab 06/17/12 0506 06/15/12 2024  LIPASE 63* 88*  AMYLASE -- --   CBC:  Lab 06/17/12 0506 06/16/12 1129 06/15/12 2024  WBC 13.9* 19.0* 6.9  NEUTROABS -- -- 6.3  HGB 8.6* 8.6* 11.6*  HCT 27.1* 26.3* 37.1  MCV 90.9 92.3 92.5  PLT 133* 152 130*    Cardiac Enzymes:  Lab 06/16/12 0103 06/15/12 2359 06/15/12 2024  CKTOTAL 59 -- --  CKMB 1.1 -- --  CKMBINDEX -- -- --  TROPONINI <0.30 <0.30 <0.30   CBG:  Lab 06/17/12 0408 06/17/12 0006 06/16/12 1949 06/16/12 1616 06/16/12 1140  GLUCAP 93 72 70 70 58*    Recent Results (from the past 240 hour(s))  URINE CULTURE     Status: Normal (Preliminary result)   Collection Time   06/15/12 10:32 PM      Component Value Range Status Comment   Specimen Description URINE, RANDOM   Final    Special Requests NONE   Final    Culture  Setup Time 06/16/2012 02:38   Final    Colony Count >=100,000 COLONIES/ML   Final    Culture ESCHERICHIA COLI   Final    Report Status PENDING   Incomplete   CULTURE, BLOOD (ROUTINE X 2)     Status: Normal (Preliminary result)   Collection Time   06/16/12 12:45 AM      Component Value Range Status Comment   Specimen Description BLOOD LEFT ANTECUBITAL   Final    Special Requests BOTTLES DRAWN AEROBIC AND ANAEROBIC 5CC   Final    Culture  Setup Time 06/16/2012 08:55   Final    Culture     Final    Value: GRAM POSITIVE COCCI IN PAIRS AND CHAINS     GRAM NEGATIVE RODS     Note: Gram Stain Report Called to,Read Back By and Verified With: BRIAN @ 2010 ON 06/16/12 BY GOLLD   Report Status PENDING   Incomplete   CULTURE, BLOOD (ROUTINE X 2)     Status: Normal (Preliminary result)   Collection Time   06/16/12 12:55 AM      Component Value Range Status Comment   Specimen Description BLOOD LEFT HAND   Final    Special Requests BOTTLES DRAWN AEROBIC AND ANAEROBIC 3CC   Final    Culture  Setup Time 06/16/2012 08:55   Final    Culture     Final    Value: GRAM POSITIVE COCCI IN PAIRS AND CHAINS     GRAM NEGATIVE RODS     Note: Gram Stain Report Called to,Read Back By and Verified With: BRIAN @ 2010 ON 06/16/12 BY GOLLD   Report Status PENDING   Incomplete   MRSA PCR SCREENING     Status: Normal   Collection Time   06/16/12  6:15 AM      Component Value Range Status Comment    MRSA by PCR NEGATIVE  NEGATIVE Final      Studies:  Recent x-ray studies have been reviewed in detail by the Attending Physician  Scheduled Meds:  Reviewed in detail by the Attending Physician   Erin Hearing, [place] [facility] Office  [phone] Pager [phone]  On-Call/Text Page:      [email or web address]      password [email or web address]  If 7PM-7AM, please contact night-coverage [email or web address] Password [email or web address] 06/17/2012, 11:28 AM   LOS: 2 days    ADDENDUM:  Have reviewed clinical data in detail, and personally examined patient. I concur with PA's assessment and plan, with the following modifications.  1. Discontinue Rocephin. Vancomyin/Zosyn combination should suffice for now.  2. BP is  excellent at this time, at 120/58-121/52. Will decrease ivi fluids still further, to 75 ml/hr.  C. Adeyemi Hamad. MD, FACP.

## 2012-06-17 NOTE — Plan of Care (Signed)
Problem: Phase I Progression Outcomes Goal: OOB as tolerated unless otherwise ordered Outcome: Progressing Pt transferred from 2100 in wheelchair.  Pt tolerated ambulation from wheel chair to unit bed fair.  Weak and limited movement in both  legs

## 2012-06-17 NOTE — Progress Notes (Signed)
Clinical Education officer, museum received referral indicating EMS expressed concern with pt's home.  CSW reviewed chart and staffed case with RNCM.  Per RNCM, pt is currently linked with home health (PT/OT/RN) as well as  PCS.  Pt currently asleep, CSW to assess when pt is more able to engage in conversation.   Dala Dock, MSW, [place]

## 2012-06-18 DIAGNOSIS — R7881 Bacteremia: Secondary | ICD-10-CM

## 2012-06-18 LAB — CBC
HCT: 27.7 % — ABNORMAL LOW (ref 36.0–46.0)
MCHC: 31.8 g/dL (ref 30.0–36.0)
RDW: 17.1 % — ABNORMAL HIGH (ref 11.5–15.5)

## 2012-06-18 LAB — BASIC METABOLIC PANEL
BUN: 13 mg/dL (ref 6–23)
GFR calc Af Amer: 27 mL/min — ABNORMAL LOW (ref >90)
GFR calc non Af Amer: 23 mL/min — ABNORMAL LOW (ref >90)
Potassium: 4.2 mEq/L (ref 3.5–5.1)

## 2012-06-18 LAB — GLUCOSE, CAPILLARY: Glucose-Capillary: 112 mg/dL — ABNORMAL HIGH (ref 70–99)

## 2012-06-18 MED ORDER — FLEET ENEMA 7-19 GM/118ML RE ENEM
1.0000 | ENEMA | Freq: Every day | RECTAL | Status: DC | PRN
  Filled 2012-06-18: qty 1

## 2012-06-18 MED ORDER — POLYETHYLENE GLYCOL 3350 17 G PO PACK
17.0000 g | PACK | Freq: Every day | ORAL | Status: DC
  Administered 2012-06-18 – 2012-06-23 (×6): 17 g via ORAL
  Filled 2012-06-18 (×6): qty 1

## 2012-06-18 MED ORDER — CEFTRIAXONE SODIUM 1 G IJ SOLR
1.0000 g | INTRAMUSCULAR | Status: DC
  Administered 2012-06-18: 1 g via INTRAVENOUS
  Filled 2012-06-18 (×4): qty 10

## 2012-06-18 MED ORDER — DOCUSATE SODIUM 100 MG PO CAPS
100.0000 mg | ORAL_CAPSULE | Freq: Two times a day (BID) | ORAL | Status: DC
  Administered 2012-06-18 – 2012-06-23 (×10): 100 mg via ORAL
  Filled 2012-06-18 (×11): qty 1

## 2012-06-18 MED ORDER — MAGNESIUM HYDROXIDE 400 MG/5ML PO SUSP: 15.0000 mL | Freq: Every day | ORAL | Status: DC | PRN

## 2012-06-18 MED ORDER — SODIUM CHLORIDE 0.9 % IV SOLN
INTRAVENOUS | Status: DC
  Administered 2012-06-18: 1000 mL via INTRAVENOUS
  Administered 2012-06-20: 10 mL/h via INTRAVENOUS
  Administered 2012-06-21 – 2012-06-22 (×2): via INTRAVENOUS

## 2012-06-18 NOTE — Clinical Social Work Psychosocial (Signed)
     Clinical Social Work Department BRIEF PSYCHOSOCIAL ASSESSMENT 06/18/2012  Patient:  [name] [name], [name] [name]     Account Number:  [id number]     Admit date:  06/15/2012  Clinical Social Worker:  Katrinka Blazing  Date/Time:  06/18/2012 02:16 PM  Referred by:  Physician  Date Referred:  06/17/2012 Referred for  Other - See comment   Other Referral:   EMS reports smells of marijuana in home.   Interview type:  Patient Other interview type:    PSYCHOSOCIAL DATA Living Status:  FAMILY Admitted from facility:   Level of care:   Primary support name:  [place]: [phone] Primary support relationship to patient:  CHILD, ADULT Degree of support available:   *Granddtr*    CURRENT CONCERNS Current Concerns  Other - See comment   Other Concerns:   Home Environment.    SOCIAL WORK ASSESSMENT / PLAN Clinical Social Worker recieved referral indicating that EMS workers reported the smell of marijuna from the home. CSW reviewed chart and noted that pt is currently linked with HH PT, RN, and DSS for [place].  CSW met with pt, introduced self, explained role, and provided support.  CSW provided opportunity for pt to process feelings.  Pt shared she moved in her with granddtr two weeks ago after leaving a SNF.  Pt stated, "they (SNF) weren't treating me right there".  Pt shares that she is happy with her current residence and though she would like to live on her own, acknowledges that it is beneficial to live with someone who can provide support through out the day.  Pt also shared that she is "happy" and "proud" of the [facility] she recieves in the home.  Pt feels her PT is helping. No concerns, worries, or feelings of uneasy are noted with current home environment.  CSW staffed case with RNCM and encouraged a HH CSW going to the home to further investigate the home environment.  A HH CSW can work in conjunction with the [place] worker who is assisting the DSS worker coordinating the  [place]  CSW to sign off, please re consulf if needed.   Assessment/plan status:  No Further Intervention Required Other assessment/ plan:   Information/referral to community resources:   DSS    PATIENTS/FAMILYS RESPONSE TO PLAN OF CARE: Pt was pleasant and engaged in conversation.  Pt appeared to communicate openly and honestly; pt did not appear guarded with her answers.

## 2012-06-18 NOTE — Progress Notes (Addendum)
[facility] Progress Note [place] TEAM 1 - Stepdown/ICU TEAM   [name] [name] [id number] DOB: [birth year] DOA: 06/15/2012 PCP: William Hamburger, MD  Brief narrative: 78 year old female patient with a history of diabetes, hypertension and dyslipidemia. Presented to the emergency department with complaints of nausea, vomiting, fever and chills. This was associated with abdominal pain in the epigastric and periumbilical regions. She described this pain as being dull aching quality and persistent. She endorsed that she had vomited at least 3 times prior to admission but did not notice any blood in the vomitus. In the emergency department she was found to have transaminitis and ultrasound showed no acute process. Patient has prior history of cholecystectomy. Urinalysis was consistent with a probable UTI and patient was febrile at presentation and although she did not have leukocytosis she had a left shift with neutrophils 91%. In addition she had acute renal failure and metabolic acidosis and also was found to have thrombocytopenia. She normally receives her medical care in [facility] and old laboratory data for comparison was not available at time of admission. Because her systolic blood pressure was less than 90 patient had been given multiple fluid challenges and evening physician started her on high flow IV fluids. Because of her hemodynamic instability and concerns over evolving sepsis process she was admitted to the step down unit for further evaluation and treatment  Assessment/Plan:  Sepsis with shock *see below re: hypotension *Currently continue supportive care and treat underlying infectious processes *At this point suspect primary source of infection is urinary tract *Urine culture reveals pansensitive Escherichia coli infection and both bottles of blood cultures revealed gram-negative rods as well as gram-positive cocci consistent with streptococcus   Gram negative rod and  gram-positive streptococcal bacteremia *Suspect gram-negative rods will be the same as urinary tract organism * 2-D echo completed 06/17/2012. Technically difficult study and unable to eliminate possibility of vegetations. Blood cultures reveal gram-positive organisms to be streptococci *We'll go ahead and narrow antibiotics from vancomycin and Zosyn back to Rocephin  Hypotension/Volume depletion *Has received >3000 cc of fluid since presentation to the emergency department.  *Blood pressure has been consistently greater than 90 systolic for the past 18 hours *Continue IV fluids but decrease to 50 cc per hour to avoid inadvertent volume overload  Escherichia coli UTI (lower urinary tract infection) *Continue empiric Rocephin for pansensitive infection  Lactic acidosis/Elevated LFTs *Resolved *Suspect related to systemic inflammatory response and acute bacterial infection-Procalcitonin elevated greater than 8 *Also suspect related to hypotension and subsequent poor organ perfusion *Continue to follow trends-we'll proceed with checking comprehensive metabolic panel today since last checked yesterday evening prior to admission  Nausea and vomiting/mildly elevated serum lipase *Resolved *Suspect nausea and vomiting precipitated by urinary tract infection since abdominal exam is benign except for mild suprapubic tenderness  ARF (acute renal failure)/metabolic acidosis *Baseline creatinine unknown, creatinine at presentation 2.0 and creatinine is trending downward  *Continue hydration and avoid nephrotoxic medication  Diabetes mellitus type 2, controlled/hypoglycemia *Has been started on diet so watch for hyperglycemia if recurs can discontinue dextrose component of IV fluids *Was on glyburide prior to admission but will hold for now and instead utilize short-acting sliding scale insulin  Anemia *After adequate hydration patient's hemoglobin has dropped into the 8.5-8.8 range *Check stools  for blood and anemia panel *Baseline hemoglobin unknown  Abdominal pain *Resolved *Was focally located in the suprapubic area and as mentioned above abdominal exam is benign suspect primary etiology related to urinary tract infection  Hyperlipidemia  HTN (hypertension) *On Lopressor prior to admission but due to current hypotension this medication is on hold *Echocardiogram this admission demonstrates preserved LV function but does have parameters consistent with grade 1 diastolic dysfunction  Chronic extremity pain *Patient endorses wrist and knee pain on the left. Was clarified as chronic and she takes Vicodin at home *On exam no evidence of erythema or any other findings concerning for seeding of bacteremia to joint but will need to follow closely. Patient is a poor historian  Social concerns *Apparently EMS personnel concerned over patient's preadmission living situation. They reported that when they went to pick up the patient at the home they found the patient in an unkempt state and she smelled of urine. In addition when they entered the home there was a strong smell of marijuana smoke. *Consult social worker-may need to involve APS  DVT prophylaxis: SCDs Code Status: Full Family Communication: Communicated directly with patient. Disposition Plan: Stable for transfer to medical bed  Consultants: None  Procedures: None  Antibiotics: Zosyn IV 8/6 >>> 8/9 Rocephin IV 8/6 >>> 8/8 Vancomycin 8/6 >>> 8/9 Rocephin 8/9 >>>  HPI/Subjective: During examination patient had just completed bath and was sitting upright in chair. Now complaining of excessive fatigue related to bath. Denied chest pain or shortness of breath. Reports would like to get back to bed.   Objective: Blood pressure 138/90, pulse 68, temperature 98.6 F (37 C), temperature source Oral, resp. rate 22, height 5\' 5"  (1.651 m), weight 108.6 kg (239 lb 6.7 oz), SpO2 100.00%.  Intake/Output Summary (Last 24  hours) at 06/18/12 1054 Last data filed at 06/18/12 1000  Gross per 24 hour  Intake 2387.5 ml  Output    300 ml  Net 2087.5 ml    Exam: Gen.: Alert, no acute distress, appears much younger than stated age Neurological: Patient is alert and oriented to name and place although seems to have some difficulty and reporting historical facts and symptoms Lungs: Bilateral lung sounds are clear to auscultation posteriorly, respiratory effort is non labored, room air maintaining saturations of 100% Cardiovascular: Heart sounds S1-S2 without rubs murmurs rubs or gallops, sinus rhythm, no peripheral edema Abdomen: Obese but soft and nontender, bowel sounds present Musculoskeletal: Extremities are symmetrical in appearance except for mild swelling of left wrist and hand as well as left knee which is not consistent with joint effusion and there is no erythema noted  Data Reviewed: Basic Metabolic Panel:  Lab [id number] 06/17/12 0506 06/16/12 1129 06/15/12 2024  NA 143 138 141 136  K 4.2 3.9 4.2 4.4  CL 114* 111 112 106  CO2 22 18* 20 17*  GLUCOSE 97 91 60* 76  BUN 13 19 23 20   CREATININE 1.96* 2.10* 2.27* 2.07*  CALCIUM 8.6 8.2* 8.3* 9.4  MG -- -- -- --  PHOS -- -- -- --   Liver Function Tests:  Lab 06/17/12 0506 06/16/12 1129 06/15/12 2024  AST 120* 238* 539*  ALT 162* 212* 253*  ALKPHOS 108 119* 187*  BILITOT 1.7* 2.9* 2.1*  PROT 6.0 6.4 8.1  ALBUMIN 2.5* 2.7* 3.6    Lab 06/17/12 0506 06/15/12 2024  LIPASE 63* 88*  AMYLASE -- --   CBC:  Lab 06/18/12 0836 06/17/12 0506 06/16/12 1129 06/15/12 2024  WBC 10.9* 13.9* 19.0* 6.9  NEUTROABS -- -- -- 6.3  HGB 8.8* 8.6* 8.6* 11.6*  HCT 27.7* 27.1* 26.3* 37.1  MCV 92.3 90.9 92.3 92.5  PLT 149* 133* 152 130*   Cardiac  Enzymes:  Lab 06/16/12 0103 06/15/12 2359 06/15/12 2024  CKTOTAL 59 -- --  CKMB 1.1 -- --  CKMBINDEX -- -- --  TROPONINI <0.30 <0.30 <0.30   CBG:  Lab 06/18/12 0733 06/18/12 0448 06/18/12 0035 06/17/12 2023  06/17/12 1624  GLUCAP 94 105* 90 125* 122*    Recent Results (from the past 240 hour(s))  URINE CULTURE     Status: Normal   Collection Time   06/15/12 10:32 PM      Component Value Range Status Comment   Specimen Description URINE, RANDOM   Final    Special Requests NONE   Final    Culture  Setup Time 06/16/2012 02:38   Final    Colony Count >=100,000 COLONIES/ML   Final    Culture ESCHERICHIA COLI   Final    Report Status 06/17/2012 FINAL   Final    Organism ID, Bacteria ESCHERICHIA COLI   Final   CULTURE, BLOOD (ROUTINE X 2)     Status: Normal (Preliminary result)   Collection Time   06/16/12 12:45 AM      Component Value Range Status Comment   Specimen Description BLOOD LEFT ANTECUBITAL   Final    Special Requests BOTTLES DRAWN AEROBIC AND ANAEROBIC 5CC   Final    Culture  Setup Time 06/16/2012 08:55   Final    Culture     Final    Value: STREPTOCOCCUS SPECIES     GRAM NEGATIVE RODS     Note: Gram Stain Report Called to,Read Back By and Verified With: BRIAN @ 2010 ON 06/16/12 BY GOLLD   Report Status PENDING   Incomplete   CULTURE, BLOOD (ROUTINE X 2)     Status: Normal (Preliminary result)   Collection Time   06/16/12 12:55 AM      Component Value Range Status Comment   Specimen Description BLOOD LEFT HAND   Final    Special Requests BOTTLES DRAWN AEROBIC AND ANAEROBIC 3CC   Final    Culture  Setup Time 06/16/2012 08:55   Final    Culture     Final    Value: STREPTOCOCCUS SPECIES     GRAM NEGATIVE RODS     Note: Gram Stain Report Called to,Read Back By and Verified With: BRIAN @ 2010 ON 06/16/12 BY GOLLD   Report Status PENDING   Incomplete   MRSA PCR SCREENING     Status: Normal   Collection Time   06/16/12  6:15 AM      Component Value Range Status Comment   MRSA by PCR NEGATIVE  NEGATIVE Final      Studies:  Recent x-ray studies have been reviewed in detail by the Attending Physician  Scheduled Meds:  Reviewed in detail by the Attending Physician   Erin Hearing,  ANP [facility] Office  [phone] Pager [phone]  On-Call/Text Page:      [email or web address]      password [email or web address]  If 7PM-7AM, please contact night-coverage [email or web address] Password [email or web address] 06/18/2012, 10:54 AM   LOS: 3 days   I have personally examined this patient and reviewed the entire database. I have reviewed the above note, made any necessary editorial changes, and agree with its content.  Cherene Altes, MD [facility]

## 2012-06-18 NOTE — Progress Notes (Signed)
ANTIBIOTIC CONSULT NOTE - FOLLOW UP  Pharmacy Consult for vancomycin/zosyn Indication: UTI/Bacteremia  No Known Allergies  Patient Measurements: Height: 5\' 5"  (165.1 cm) Weight: 239 lb 6.7 oz (108.6 kg) IBW/kg (Calculated) : 57  Adjusted Body Weight:   Vital Signs: Temp: 98.4 F (36.9 C) (08/09 0449) Temp src: Oral (08/09 0449) BP: 138/90 mmHg (08/09 0800) Pulse Rate: 68  (08/09 0800) Intake/Output from previous day: 08/08 0701 - 08/09 0700 In: 2042.5 [P.O.:180; I.V.:1700; IV Piggyback:162.5] Out: 300 [Urine:300] Intake/Output from this shift: Total I/O In: 75 [I.V.:75] Out: -   Labs:  Basename 06/17/12 0506 06/16/12 1129 06/15/12 2024  WBC 13.9* 19.0* 6.9  HGB 8.6* 8.6* 11.6*  PLT 133* 152 130*  LABCREA -- -- --  CREATININE 2.10* 2.27* 2.07*   Estimated Creatinine Clearance: 26.2 ml/min (by C-G formula based on Cr of 2.1). No results found for this basename: VANCOTROUGH:2,VANCOPEAK:2,VANCORANDOM:2,GENTTROUGH:2,GENTPEAK:2,GENTRANDOM:2,TOBRATROUGH:2,TOBRAPEAK:2,TOBRARND:2,AMIKACINPEAK:2,AMIKACINTROU:2,AMIKACIN:2, in the last 72 hours   Microbiology: Recent Results (from the past 720 hour(s))  URINE CULTURE     Status: Normal   Collection Time   06/15/12 10:32 PM      Component Value Range Status Comment   Specimen Description URINE, RANDOM   Final    Special Requests NONE   Final    Culture  Setup Time 06/16/2012 02:38   Final    Colony Count >=100,000 COLONIES/ML   Final    Culture ESCHERICHIA COLI   Final    Report Status 06/17/2012 FINAL   Final    Organism ID, Bacteria ESCHERICHIA COLI   Final   CULTURE, BLOOD (ROUTINE X 2)     Status: Normal (Preliminary result)   Collection Time   06/16/12 12:45 AM      Component Value Range Status Comment   Specimen Description BLOOD LEFT ANTECUBITAL   Final    Special Requests BOTTLES DRAWN AEROBIC AND ANAEROBIC 5CC   Final    Culture  Setup Time 06/16/2012 08:55   Final    Culture     Final    Value: GRAM POSITIVE  COCCI IN PAIRS AND CHAINS     GRAM NEGATIVE RODS     Note: Gram Stain Report Called to,Read Back By and Verified With: BRIAN @ 2010 ON 06/16/12 BY GOLLD   Report Status PENDING   Incomplete   CULTURE, BLOOD (ROUTINE X 2)     Status: Normal (Preliminary result)   Collection Time   06/16/12 12:55 AM      Component Value Range Status Comment   Specimen Description BLOOD LEFT HAND   Final    Special Requests BOTTLES DRAWN AEROBIC AND ANAEROBIC 3CC   Final    Culture  Setup Time 06/16/2012 08:55   Final    Culture     Final    Value: GRAM POSITIVE COCCI IN PAIRS AND CHAINS     GRAM NEGATIVE RODS     Note: Gram Stain Report Called to,Read Back By and Verified With: BRIAN @ 2010 ON 06/16/12 BY GOLLD   Report Status PENDING   Incomplete   MRSA PCR SCREENING     Status: Normal   Collection Time   06/16/12  6:15 AM      Component Value Range Status Comment   MRSA by PCR NEGATIVE  NEGATIVE Final     Anti-infectives     Start     Dose/Rate Route Frequency Ordered Stop   06/18/12 2200   vancomycin (VANCOCIN) 1,500 mg in sodium chloride 0.9 % 500 mL IVPB  1,500 mg 250 mL/hr over 120 Minutes Intravenous Every 48 hours 06/16/12 2050     06/17/12 1400  piperacillin-tazobactam (ZOSYN) IVPB 3.375 g       3.375 g 12.5 mL/hr over 240 Minutes Intravenous Every 8 hours 06/17/12 0547     06/16/12 2200   cefTRIAXone (ROCEPHIN) 1 g in dextrose 5 % 50 mL IVPB  Status:  Discontinued        1 g 100 mL/hr over 30 Minutes Intravenous Every 24 hours 06/16/12 0548 06/16/12 2028   06/16/12 2130   piperacillin-tazobactam (ZOSYN) IVPB 3.375 g  Status:  Discontinued        3.375 g 12.5 mL/hr over 240 Minutes Intravenous Every 8 hours 06/16/12 2050 06/17/12 0547   06/16/12 2100   vancomycin (VANCOCIN) 2,000 mg in sodium chloride 0.9 % 500 mL IVPB        2,000 mg 250 mL/hr over 120 Minutes Intravenous  Once 06/16/12 2050 06/17/12 0000   06/16/12 0100  piperacillin-tazobactam (ZOSYN) IVPB 3.375 g       3.375  g 12.5 mL/hr over 240 Minutes Intravenous  Once 06/16/12 0054 06/16/12 0555   06/16/12 0030   cefTRIAXone (ROCEPHIN) 1 g in dextrose 5 % 50 mL IVPB        1 g 100 mL/hr over 30 Minutes Intravenous  Once 06/16/12 0023 06/16/12 0130          Assessment: 82 YOF admitted with abd pain, N/V, and fever.  D#3 vanco/zosyn. Urine culture reveals PAN-SENSITIVE E. Coli and blood cultures still pending but gram stain reveals GNR and GPC pairs/chains - suspect strep or enterococcus.  She is currently afebrile, labs for today unavailable.   Rocephin 8/7 >> 8/7 Zosyn 8/7 >> vanc 8/7 >>  8/6 BCx x2 >> GPC in pairs & chains, GNR 8/6 UCx >> E.coli 8/7 Hep A >> neg 8/7 Hep B sur Ab >> neg 8/7 Hep B IgM >> neg 8/7 HCV >> neg 8/7 MRSA PCR >> neg  Goal of Therapy:  Vancomycin trough level 15-20 mcg/ml  Plan:  1. While awaiting final BC results, current vancomycin and zosyn doses remain appropriate for renal function, weight, and indication. 2. De-escalate with final blood culture results and pan-sensitive e. Coli UTI.   [name] [name] 06/18/2012,8:21 AM

## 2012-06-18 NOTE — Progress Notes (Signed)
Pt ambulated to chair in room.  Pt tolerated ambulation fair; vital signs remained stable without complaint of pain.  However, pt extremely weak lower extremities and tenderness on right arm made ambulation challenging.  Difficult for pt to support own weight, and move feet during ambulation.  Pt is sitting in chair and pt will continue to monitor.

## 2012-06-18 NOTE — Progress Notes (Signed)
Pt received from 2100, pt alert and oriented to person, place, disoriented to time, no complaints of pain at this time, IVF @ 50 cc/hr via #22 L wrist, no skin issuess, some generalized weakness, but pt was able to take a few steps from w/c to bed, no signs of resp. Distress, pt on room air, will continue to monitor

## 2012-06-18 NOTE — Progress Notes (Addendum)
Nurse called report to [facility] for pt's new room of 6740.  Pt was transported to new unit on room air, IV fluid Dextrose and NS infusing at 50 ml/hr.  Pt's family has been informed of new room.  Belongings were carried with pt to new room. Prior to leaving unit the pt did not express any concerns nor questions.

## 2012-06-18 NOTE — Progress Notes (Signed)
Upon entering room pt complained of 5/10 pain.  Nurse contacted PA with concern of administering PRN ordered Acetaminophen due to pt's history of increased LFT's since current hospitalization.  PA acknowledged concern and informed nurse that the medication may be administered as ordered.  Nurse administered PRN medication and will continue to monitor.

## 2012-06-19 LAB — RETICULOCYTES
RBC.: 2.81 MIL/uL — ABNORMAL LOW (ref 3.87–5.11)
Retic Count, Absolute: 53.4 10*3/uL (ref 19.0–186.0)
Retic Ct Pct: 1.9 % (ref 0.4–3.1)

## 2012-06-19 LAB — COMPREHENSIVE METABOLIC PANEL
BUN: 10 mg/dL (ref 6–23)
CO2: 24 mEq/L (ref 19–32)
Chloride: 112 mEq/L (ref 96–112)
Creatinine, Ser: 1.9 mg/dL — ABNORMAL HIGH (ref 0.50–1.10)
GFR calc non Af Amer: 24 mL/min — ABNORMAL LOW (ref >90)
Glucose, Bld: 76 mg/dL (ref 70–99)
Total Bilirubin: 0.6 mg/dL (ref 0.3–1.2)

## 2012-06-19 LAB — CULTURE, BLOOD (ROUTINE X 2)

## 2012-06-19 LAB — FOLATE: Folate: 2.2 ng/mL — ABNORMAL LOW

## 2012-06-19 LAB — CBC
HCT: 25.8 % — ABNORMAL LOW (ref 36.0–46.0)
Hemoglobin: 8.3 g/dL — ABNORMAL LOW (ref 12.0–15.0)
MCV: 91.8 fL (ref 78.0–100.0)
RBC: 2.81 MIL/uL — ABNORMAL LOW (ref 3.87–5.11)
WBC: 9 10*3/uL (ref 4.0–10.5)

## 2012-06-19 LAB — VITAMIN B12: Vitamin B-12: 230 pg/mL (ref 211–911)

## 2012-06-19 MED ORDER — INSULIN ASPART 100 UNIT/ML ~~LOC~~ SOLN: 0.0000 [IU] | Freq: Three times a day (TID) | SUBCUTANEOUS | Status: DC

## 2012-06-19 MED ORDER — ALBUTEROL SULFATE (5 MG/ML) 0.5% IN NEBU: 2.5000 mg | INHALATION_SOLUTION | Freq: Four times a day (QID) | RESPIRATORY_TRACT | Status: DC | PRN

## 2012-06-19 MED ORDER — INSULIN ASPART 100 UNIT/ML ~~LOC~~ SOLN: 0.0000 [IU] | Freq: Every day | SUBCUTANEOUS | Status: DC

## 2012-06-19 MED ORDER — SENNOSIDES-DOCUSATE SODIUM 8.6-50 MG PO TABS
1.0000 | ORAL_TABLET | Freq: Two times a day (BID) | ORAL | Status: DC
  Administered 2012-06-19 – 2012-06-23 (×9): 1 via ORAL
  Filled 2012-06-19 (×10): qty 1

## 2012-06-19 MED ORDER — DEXTROSE 5 % IV SOLN
2.0000 g | INTRAVENOUS | Status: DC
  Administered 2012-06-19 – 2012-06-23 (×5): 2 g via INTRAVENOUS
  Filled 2012-06-19 (×5): qty 2

## 2012-06-19 MED ORDER — ALBUTEROL SULFATE (5 MG/ML) 0.5% IN NEBU
2.5000 mg | INHALATION_SOLUTION | Freq: Four times a day (QID) | RESPIRATORY_TRACT | Status: DC
Start: 2012-06-19 — End: 2012-06-19
  Administered 2012-06-19: 2.5 mg via RESPIRATORY_TRACT
  Filled 2012-06-19: qty 0.5

## 2012-06-19 NOTE — Progress Notes (Signed)
Received consult this morning for [place] placement. Will defer to CSW. Patient has home health already arranged. Annabell Sabal [place] 06/19/2012 1032a

## 2012-06-19 NOTE — Progress Notes (Signed)
[facility] PROGRESS NOTE  [name] [name] [id number] DOB: Dec 17, 1929 DOA: 06/15/2012 PCP: William Hamburger, MD  Assessment/Plan: Sepsis likely due to urinary source -Increase ceftriaxone to 2 g IV daily -Patient remains afebrile and hemodynamically stable Bacteremia -Identification pending at this time for gram-negative rods and Streptococcus species Bacteruria -Escherichia coli in the urine Diabetes mellitus type 2, well controlled -Hemoglobin A1c 5.6 on August 7 -Hypoglycemia resolved -Continue insulin sliding scale Anemia -Hemoglobin has remained stable between 8 and 9.0 Renal failure -Unclear what the patient's baseline creatinine was. The patient received most of her care at [place] is to discharge the patient to a skilled nursing facility for PT OT -I discussed with her granddaughter, her primary caregiver who is agreeable -consult case management    Procedures/Studies: Ct Abdomen Pelvis Wo Contrast  06/16/2012  *RADIOLOGY REPORT*  Clinical Data: Nausea, vomiting, chills, and weakness.  Abdominal pain.  Elevated liver function tests and lipase.  CT ABDOMEN AND PELVIS WITHOUT CONTRAST  Technique:  Multidetector CT imaging of the abdomen and pelvis was performed following the standard protocol without intravenous contrast.  Comparison: None.  Findings: Motion artifact limits visualization of the lung bases but there appear to be multiple small nodules present bilaterally, measuring up to about 4 mm diameter.  Nodules are indeterminate. If the patient is at high risk for bronchogenic carcinoma, follow-up chest CT at 1 year is recommended.  If the patient is at low risk, no follow-up is needed.  This recommendation follows the consensus statement: Guidelines for Management of Small Pulmonary Nodules Detected on CT Scans:  A Statement from the [place] as published in Radiology 2005; 237:395-400.  The unenhanced appearance of the liver,  spleen, pancreas, adrenal glands, kidneys, abdominal aorta, and retroperitoneal lymph nodes is unremarkable.  Surgical absence of the gallbladder.  Mild infiltration or edema in the pararenal fat bilaterally and in the subcutaneous fat. No free fluid or free air in the abdomen.  The stomach, small bowel, and colon are not abnormally distended.  Pelvis:  The uterus and adnexal structures are not significantly enlarged.  Calcifications in the uterus suggesting small fibroids. Bladder wall is not thickened.  Small amount of gas in the bladder may represent infection or catheterization.  No free or loculated pelvic fluid collections.  No significant pelvic lymphadenopathy. The appendix is not identified.  Degenerative changes in the lumbar spine.  IMPRESSION: No definite acute process demonstrated on unenhanced imaging of the abdomen and pelvis.  Small indeterminate nodules in the lung bases. Small focus of gas in the bladder which could represent infection or catheterization.  Original Report Authenticated By: Neale Burly, M.D.   Dg Chest 2 View  06/15/2012  *RADIOLOGY REPORT*  Clinical Data: Generalized body aches  CHEST - 2 VIEW  Comparison: None.  Findings: The lung volumes are low.  There is enlargement of central pulmonary vessels probably as a result of low lung volumes. There is no focal pneumonia or pleural effusion.  The aorta is uncoiled.  The heart size is enlarged.  The soft tissue osseous structures are unremarkable.  IMPRESSION: Low lung volumes.  Cardiomegaly.  No focal pneumonia.  Original Report Authenticated By: Abelardo Diesel, M.D.   US Abdomen Complete  06/16/2012  *RADIOLOGY REPORT*  Clinical Data:  Vomiting and mid epigastric pain.  COMPLETE ABDOMINAL ULTRASOUND  Comparison:  None.  Findings:  Gallbladder:  Surgical absence of the gallbladder.  Common bile duct:  Mild prominence of bile ducts with diameter measured at 9  mm.  This is likely normal in a postoperative patient.  Liver:   Somewhat limited visualization of the liver.  Segmental images are available demonstrating no focal lesion and normal parenchymal echotexture.  IVC:  Appears normal.  Pancreas:  Pancreas is obscured by overlying bowel gas is not well visualized.  Spleen:  Spleen length measures about 11 cm.  Normal parenchymal echotexture.  Right Kidney:  Right kidney measures 11.4 cm length.  No hydronephrosis.  Left Kidney:  Left kidney measures 12 cm length.  No hydronephrosis.  Abdominal aorta:  No aneurysm identified.  IMPRESSION: Somewhat technically limited study due to patient body habitus and bowel gas.  Surgical absence of the gallbladder.  Mild physiologic dilatation of bile ducts.  No acute process suggested.  Original Report Authenticated By: Neale Burly, M.D.     Antibiotics:  Zosyn August 6-August 9  Vancomycin August 6-August 9  Ceftriaxone August 6-   Code Status: Full Family Communication: Pt at bedside Disposition Plan: Home when medically stable  Subjective: The patient complains of constipation this morning. He denies any chest pain, shortness of breath, fevers, chills, nausea, vomiting, rashes. Jugate 50% of her breakfast. She denies any dysuria.  Objective: Filed Vitals:   06/18/12 1424 06/18/12 1800 06/18/12 2054 06/19/12 0619  BP: 144/78 132/76 144/78 150/75  Pulse: 66 68 89 73  Temp: 97.5 F (36.4 C) 98 F (36.7 C) 98.5 F (36.9 C) 98.6 F (37 C)  TempSrc: Oral Oral Oral Oral  Resp: 20 20 21 21   Height:   5\' 10"  (1.778 m)   Weight:   115 kg (253 lb 8.5 oz)   SpO2: 98% 96% 95% 92%    Intake/Output Summary (Last 24 hours) at 06/19/12 0911 Last data filed at 06/18/12 1700  Gross per 24 hour  Intake    535 ml  Output      0 ml  Net    535 ml   Weight change: 8.7 kg (19 lb 2.9 oz) Exam:   General:  Pt is alert, follows commands appropriately, not in acute distress  HEENT: No icterus, No thrush, No neck mass, [place]/AT  Cardiovascular: Regular rate and  rhythm, S1/S2, , no rubs, no gallops  Respiratory: Scattered wheezes bibasilar  Abdomen: Soft, non tender, non distended, bowel sounds present, no guarding  Extremities: Trace edema bilateral lower extremities, no rashes  Data Reviewed: Basic Metabolic Panel:  Lab [id number] 0618 06/18/12 0836 06/17/12 0506 06/16/12 1129 06/15/12 2024  NA 142 143 138 141 136  K 4.2 4.2 3.9 4.2 4.4  CL 112 114* 111 112 106  CO2 24 22 18* 20 17*  GLUCOSE 76 97 91 60* 76  BUN 10 13 19 23 20   CREATININE 1.90* 1.96* 2.10* 2.27* 2.07*  CALCIUM 8.5 8.6 8.2* 8.3* 9.4  MG -- -- -- -- --  PHOS -- -- -- -- --   Liver Function Tests:  Lab 06/19/12 0618 06/17/12 0506 06/16/12 1129 06/15/12 2024  AST 28 120* 238* 539*  ALT 85* 162* 212* 253*  ALKPHOS 99 108 119* 187*  BILITOT 0.6 1.7* 2.9* 2.1*  PROT 6.2 6.0 6.4 8.1  ALBUMIN 2.4* 2.5* 2.7* 3.6    Lab 06/17/12 0506 06/15/12 2024  LIPASE 63* 88*  AMYLASE -- --   No results found for this basename: AMMONIA:5 in the last 168 hours CBC:  Lab 06/19/12 0618 06/18/12 0836 06/17/12 0506 06/16/12 1129 06/15/12 2024  WBC 9.0 10.9* 13.9* 19.0* 6.9  NEUTROABS -- -- -- --  6.3  HGB 8.3* 8.8* 8.6* 8.6* 11.6*  HCT 25.8* 27.7* 27.1* 26.3* 37.1  MCV 91.8 92.3 90.9 92.3 92.5  PLT 158 149* 133* 152 130*   Cardiac Enzymes:  Lab 06/16/12 0103 06/15/12 2359 06/15/12 2024  CKTOTAL 59 -- --  CKMB 1.1 -- --  CKMBINDEX -- -- --  TROPONINI <0.30 <0.30 <0.30   BNP: No components found with this basename: POCBNP:5 CBG:  Lab 06/19/12 0749 06/18/12 2047 06/18/12 1627 06/18/12 1201 06/18/12 0733  GLUCAP 78 112* 113* 139* 94    Recent Results (from the past 240 hour(s))  URINE CULTURE     Status: Normal   Collection Time   06/15/12 10:32 PM      Component Value Range Status Comment   Specimen Description URINE, RANDOM   Final    Special Requests NONE   Final    Culture  Setup Time 06/16/2012 02:38   Final    Colony Count >=100,000 COLONIES/ML   Final    Culture  ESCHERICHIA COLI   Final    Report Status 06/17/2012 FINAL   Final    Organism ID, Bacteria ESCHERICHIA COLI   Final   CULTURE, BLOOD (ROUTINE X 2)     Status: Normal (Preliminary result)   Collection Time   06/16/12 12:45 AM      Component Value Range Status Comment   Specimen Description BLOOD LEFT ANTECUBITAL   Final    Special Requests BOTTLES DRAWN AEROBIC AND ANAEROBIC 5CC   Final    Culture  Setup Time 06/16/2012 08:55   Final    Culture     Final    Value: STREPTOCOCCUS SPECIES     GRAM NEGATIVE RODS     Note: Gram Stain Report Called to,Read Back By and Verified With: BRIAN @ 2010 ON 06/16/12 BY GOLLD   Report Status PENDING   Incomplete   CULTURE, BLOOD (ROUTINE X 2)     Status: Normal (Preliminary result)   Collection Time   06/16/12 12:55 AM      Component Value Range Status Comment   Specimen Description BLOOD LEFT HAND   Final    Special Requests BOTTLES DRAWN AEROBIC AND ANAEROBIC 3CC   Final    Culture  Setup Time 06/16/2012 08:55   Final    Culture     Final    Value: STREPTOCOCCUS SPECIES     GRAM NEGATIVE RODS     Note: Gram Stain Report Called to,Read Back By and Verified With: BRIAN @ 2010 ON 06/16/12 BY GOLLD   Report Status PENDING   Incomplete   MRSA PCR SCREENING     Status: Normal   Collection Time   06/16/12  6:15 AM      Component Value Range Status Comment   MRSA by PCR NEGATIVE  NEGATIVE Final      Scheduled Meds:   . antiseptic oral rinse  15 mL Mouth Rinse q12n4p  . atorvastatin  10 mg Oral Daily  . cefTRIAXone (ROCEPHIN)  IV  1 g Intravenous Q24H  . chlorhexidine  15 mL Mouth Rinse BID  . docusate sodium  100 mg Oral BID  . escitalopram  20 mg Oral Daily  . insulin aspart  0-9 Units Subcutaneous TID WC  . pantoprazole  40 mg Oral Daily  . polyethylene glycol  17 g Oral Daily  . DISCONTD: piperacillin-tazobactam (ZOSYN)  IV  3.375 g Intravenous Q8H  . DISCONTD: vancomycin  1,500 mg Intravenous Q48H   Continuous Infusions:   .  sodium chloride  1,000 mL (06/18/12 1840)  . DISCONTD: dextrose 5 % and 0.9% NaCl 50 mL/hr (06/18/12 1053)     Jilleen Essner, DO  [facility] Pager [phone]  If 7PM-7AM, please contact night-coverage [email or web address] Password [email or web address] 06/19/2012, 9:11 AM   LOS: 4 days

## 2012-06-19 NOTE — Evaluation (Signed)
Physical Therapy Evaluation Patient Details Name: [name] [name] MRN: [id number] DOB: 1930-03-21 Today's Date: 06/19/2012 Time: [id number] PT Time Calculation (min): 25 min  PT Assessment / Plan / Recommendation Clinical Impression  Patient s/p sepsis with decr mobility secondary to weakness due to a few days of bedrest.  Will benefit from PT to address endurance and balance issues.  Patient states she has 24 hour care.  HHPT appropriate if so.  If not 24 hour care, [facility]P appropriate.    PT Assessment  Patient needs continued PT services    Follow Up Recommendations  Home health PT;Supervision/Assistance - 24 hour    Barriers to Discharge        Equipment Recommendations  None recommended by PT    Recommendations for Other Services     Frequency Min 3X/week    Precautions / Restrictions Precautions Precautions: Fall Restrictions Weight Bearing Restrictions: No   Pertinent Vitals/Pain VSS, Some pain secondary to needed to have BM      Mobility  Bed Mobility Bed Mobility: Rolling Left;Left Sidelying to Sit;Sitting - Scoot to Edge of Bed Rolling Left: 4: Min assist;With rail Left Sidelying to Sit: 4: Min assist;With rails;HOB elevated Sitting - Scoot to [employer] of Bed: 4: Min assist Details for Bed Mobility Assistance: cues for technique Transfers Transfers: Sit to Stand;Stand to [employer] Transfers Sit to Stand: 4: Min assist;With upper extremity assist;From bed Stand to Sit: 3: Mod assist;With upper extremity assist;With armrests;To chair/3-in-1 Stand Pivot Transfers: 1: +2 Total assist Stand Pivot Transfers: Patient Percentage: 60% Details for Transfer Assistance: Patient needed cues for hand placement for sit to stand.  Patient also needed cues for stand to sit and to control descent into chair as patient "plopped" into chair.  Patient needed cues for sequencing steps and RW for turning and pivoting to chair.  Needed assist to move RW as well.  Patient with flexed posture  as well with cues needed to stand tall.   Ambulation/Gait Ambulation/Gait Assistance: Not tested (comment) Stairs: No Wheelchair Mobility Wheelchair Mobility: No         PT Diagnosis: Generalized weakness  PT Problem List: Decreased activity tolerance;Decreased balance;Decreased mobility;Decreased safety awareness;Decreased knowledge of use of DME PT Treatment Interventions: DME instruction;Gait training;Functional mobility training;Therapeutic activities;Therapeutic exercise;Balance training;Patient/family education   PT Goals Acute Rehab PT Goals PT Goal Formulation: With patient Time For Goal Achievement: 06/26/12 Potential to Achieve Goals: Good Pt will go Supine/Side to Sit: with supervision PT Goal: Supine/Side to Sit - Progress: Goal set today Pt will go Sit to Stand: with supervision;with upper extremity assist PT Goal: Sit to Stand - Progress: Goal set today Pt will Transfer Bed to Chair/Chair to Bed: with supervision PT Transfer Goal: Bed to Chair/Chair to Bed - Progress: Goal set today Pt will Ambulate: 16 - 50 feet;with supervision;with least restrictive assistive device PT Goal: Ambulate - Progress: Goal set today  Visit Information  Last PT Received On: 06/19/12 Assistance Needed: +2    Subjective Data  Subjective: "I want to use the bathroom." Patient Stated Goal: To go home   Prior Functioning  Home Living Lives With:  (granddaughter) Available Help at Discharge: Available 24 hours/day (aide 3 hours day 7 days a week) Type of Home: House Home Access: Ramped entrance Home Layout: One level Bathroom Shower/Tub:  (sponge bathes) Bathroom Toilet: Standard Home Adaptive Equipment: Walker - rolling;Wheelchair - manual;Hospital bed Prior Function Level of Independence: Independent with assistive device(s);Needs assistance Needs Assistance: Bathing;Dressing;Feeding;Grooming;Toileting Bath: Total Dressing: Moderate Feeding:  Supervision/set-up Grooming:  Supervision/set-up Toileting: Moderate Able to Take Stairs?: No Driving: No Vocation: 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 Dominant Hand: Right    Cognition  Overall Cognitive Status: Appears within functional limits for tasks assessed/performed Arousal/Alertness: Awake/alert Orientation Level: Appears intact for tasks assessed Behavior During Session: [facility] for tasks performed    Extremity/Trunk Assessment Right Upper Extremity Assessment RUE ROM/Strength/Tone: WFL for tasks assessed RUE Sensation: WFL - Light Touch RUE Coordination: WFL - gross/fine motor Left Upper Extremity Assessment LUE ROM/Strength/Tone: WFL for tasks assessed LUE Sensation: WFL - Light Touch LUE Coordination: WFL - gross/fine motor Right Lower Extremity Assessment RLE ROM/Strength/Tone: WFL for tasks assessed RLE Sensation: WFL - Light Touch RLE Coordination: WFL - gross/fine motor Left Lower Extremity Assessment LLE ROM/Strength/Tone: WFL for tasks assessed LLE Sensation: WFL - Light Touch LLE Coordination: WFL - gross/fine motor   Balance Static Standing Balance Static Standing - Balance Support: Bilateral upper extremity supported;During functional activity Static Standing - Level of Assistance: 5: Stand by assistance Static Standing - Comment/# of Minutes: Stood 2 minutes to be cleaned with total assist for cleaning and stand by for steadying.  End of Session PT - End of Session Equipment Utilized During Treatment: Gait belt Activity Tolerance: Patient tolerated treatment well Patient left: in chair;with call bell/phone within reach;with chair alarm set Nurse Communication: Mobility status       INGOLD,Yaire Kreher 06/19/2012, 2:53 PM  [facility] Acute Rehabilitation [phone] (pager)

## 2012-06-20 DIAGNOSIS — R6520 Severe sepsis without septic shock: Secondary | ICD-10-CM

## 2012-06-20 DIAGNOSIS — M79609 Pain in unspecified limb: Secondary | ICD-10-CM

## 2012-06-20 DIAGNOSIS — A419 Sepsis, unspecified organism: Secondary | ICD-10-CM

## 2012-06-20 LAB — CBC WITH DIFFERENTIAL/PLATELET
Basophils Relative: 1 % (ref 0–1)
Eosinophils Absolute: 0.3 10*3/uL (ref 0.0–0.7)
Eosinophils Relative: 3 % (ref 0–5)
HCT: 26 % — ABNORMAL LOW (ref 36.0–46.0)
Hemoglobin: 8.5 g/dL — ABNORMAL LOW (ref 12.0–15.0)
MCH: 29.8 pg (ref 26.0–34.0)
MCHC: 32.7 g/dL (ref 30.0–36.0)
Monocytes Absolute: 1.1 10*3/uL — ABNORMAL HIGH (ref 0.1–1.0)
Monocytes Relative: 13 % — ABNORMAL HIGH (ref 3–12)

## 2012-06-20 LAB — GLUCOSE, CAPILLARY
Glucose-Capillary: 92 mg/dL (ref 70–99)
Glucose-Capillary: 137 mg/dL — ABNORMAL HIGH (ref 70–99)

## 2012-06-20 LAB — BASIC METABOLIC PANEL
BUN: 10 mg/dL (ref 6–23)
Calcium: 8.6 mg/dL (ref 8.4–10.5)
Chloride: 107 mEq/L (ref 96–112)
Creatinine, Ser: 1.76 mg/dL — ABNORMAL HIGH (ref 0.50–1.10)
GFR calc Af Amer: 30 mL/min — ABNORMAL LOW (ref >90)
GFR calc non Af Amer: 26 mL/min — ABNORMAL LOW (ref >90)

## 2012-06-20 MED ORDER — SODIUM CHLORIDE 0.9 % IV SOLN
INTRAVENOUS | Status: DC
  Administered 2012-06-20: 12:00:00 via INTRAVENOUS
  Filled 2012-06-20: qty 1000

## 2012-06-20 NOTE — Progress Notes (Addendum)
[facility] PROGRESS NOTE  [name] [name] [id number] DOB: December 29, 1929 DOA: 06/15/2012 PCP: William Hamburger, MD  Assessment/Plan: Sepsis -Continue ceftriaxone to 2 g IV daily  -Patient remains afebrile and hemodynamically stable  Bacteremia  -Klebsiella pneumoniae and Streptococcus gallolyticus -Plan to convert to oral cephalexin when ready for discharge as this has great bioavailability -Will need outpatient endoscopy due to Streptococcus gallolyticus (S.bovis) -Repeat blood cultures today to ensure clearance Bacteruria  -Escherichia coli in the urine  Left leg pain -Venous ultrasound left leg rule out DVT Diabetes mellitus type 2, well controlled  -Hemoglobin A1c 5.6 on August 7  -Discontinued Accu-Cheks Anemia  -Hemoglobin has remained stable between 8 and 9.0  Renal failure  -Give 1 L normal saline Deconditioning  -Incentive spirometry -Ultimate plan is to discharge the patient to a skilled nursing facility for PT OT  -I discussed with her granddaughter, her primary caregiver who is agreeable  -consult case management   Principal Problem:  *Sepsis associated hypotension Active Problems:  Abdominal pain  Elevated LFTs  Nausea and vomiting  ARF (acute renal failure)  Hypotension  Diabetes mellitus type 2, controlled  Hyperlipidemia  Volume depletion  UTI (lower urinary tract infection)  Lactic acidosis  HTN (hypertension)  Bacteremia do to gram-negative rods and gram-positive cocci   Procedures/Studies: Ct Abdomen Pelvis Wo Contrast  06/16/2012  *RADIOLOGY REPORT*  Clinical Data: Nausea, vomiting, chills, and weakness.  Abdominal pain.  Elevated liver function tests and lipase.  CT ABDOMEN AND PELVIS WITHOUT CONTRAST  Technique:  Multidetector CT imaging of the abdomen and pelvis was performed following the standard protocol without intravenous contrast.  Comparison: None.  Findings: Motion artifact limits visualization of the lung bases but there appear to be  multiple small nodules present bilaterally, measuring up to about 4 mm diameter.  Nodules are indeterminate. If the patient is at high risk for bronchogenic carcinoma, follow-up chest CT at 1 year is recommended.  If the patient is at low risk, no follow-up is needed.  This recommendation follows the consensus statement: Guidelines for Management of Small Pulmonary Nodules Detected on CT Scans:  A Statement from the [employer] as published in Radiology 2005; 237:395-400.  The unenhanced appearance of the liver, spleen, pancreas, adrenal glands, kidneys, abdominal aorta, and retroperitoneal lymph nodes is unremarkable.  Surgical absence of the gallbladder.  Mild infiltration or edema in the pararenal fat bilaterally and in the subcutaneous fat. No free fluid or free air in the abdomen.  The stomach, small bowel, and colon are not abnormally distended.  Pelvis:  The uterus and adnexal structures are not significantly enlarged.  Calcifications in the uterus suggesting small fibroids. Bladder wall is not thickened.  Small amount of gas in the bladder may represent infection or catheterization.  No free or loculated pelvic fluid collections.  No significant pelvic lymphadenopathy. The appendix is not identified.  Degenerative changes in the lumbar spine.  IMPRESSION: No definite acute process demonstrated on unenhanced imaging of the abdomen and pelvis.  Small indeterminate nodules in the lung bases. Small focus of gas in the bladder which could represent infection or catheterization.  Original Report Authenticated By: Neale Burly, M.D.   Dg Chest 2 View  06/15/2012  *RADIOLOGY REPORT*  Clinical Data: Generalized body aches  CHEST - 2 VIEW  Comparison: None.  Findings: The lung volumes are low.  There is enlargement of central pulmonary vessels probably as a result of low lung volumes. There is no focal pneumonia or pleural effusion.  The aorta  is uncoiled.  The heart size is enlarged.  The soft tissue  osseous structures are unremarkable.  IMPRESSION: Low lung volumes.  Cardiomegaly.  No focal pneumonia.  Original Report Authenticated By: Abelardo Diesel, M.D.   US Abdomen Complete  06/16/2012  *RADIOLOGY REPORT*  Clinical Data:  Vomiting and mid epigastric pain.  COMPLETE ABDOMINAL ULTRASOUND  Comparison:  None.  Findings:  Gallbladder:  Surgical absence of the gallbladder.  Common bile duct:  Mild prominence of bile ducts with diameter measured at 9 mm.  This is likely normal in a postoperative patient.  Liver:  Somewhat limited visualization of the liver.  Segmental images are available demonstrating no focal lesion and normal parenchymal echotexture.  IVC:  Appears normal.  Pancreas:  Pancreas is obscured by overlying bowel gas is not well visualized.  Spleen:  Spleen length measures about 11 cm.  Normal parenchymal echotexture.  Right Kidney:  Right kidney measures 11.4 cm length.  No hydronephrosis.  Left Kidney:  Left kidney measures 12 cm length.  No hydronephrosis.  Abdominal aorta:  No aneurysm identified.  IMPRESSION: Somewhat technically limited study due to patient body habitus and bowel gas.  Surgical absence of the gallbladder.  Mild physiologic dilatation of bile ducts.  No acute process suggested.  Original Report Authenticated By: Neale Burly, M.D.      Antibiotics:  Zosyn August 7 to August 9  Vancomycin August 7 to  August 9  Ceftriaxone August 9>>>   Code Status: Full Family Communication: Pt at bedside Disposition Plan: Home when medically stable  Subjective: Patient had a small bowel movement. She still feels like she can have more. She denies any nausea, vomiting, abdominal pain, chest pain, shortness of breath, fevers, chills. She is tolerating her diet.  Objective: Filed Vitals:   06/19/12 1438 06/19/12 1704 06/19/12 2120 06/20/12 0516  BP:  119/60 150/63 137/64  Pulse:  81 63 70  Temp:  97.2 F (36.2 C) 98.9 F (37.2 C) 98.5 F (36.9 C)  TempSrc:  Oral  Oral Oral  Resp:  19 20 20   Height:      Weight:   114.7 kg (252 lb 13.9 oz)   SpO2: 98% 100% 99% 96%    Intake/Output Summary (Last 24 hours) at 06/20/12 1016 Last data filed at 06/20/12 0600  Gross per 24 hour  Intake    650 ml  Output      0 ml  Net    650 ml   Weight change: -0.3 kg (-10.6 oz) Exam:   General:  Pt is alert, follows commands appropriately, not in acute distress  HEENT: No icterus, No thrush, No neck mass, [place]/AT. She is  Cardiovascular: Regular rate and rhythm, S1/S2, , no rubs, no gallops  Respiratory: Fine basilar rales. No wheezes or rhonchi. Good air movement  Abdomen: Soft, non tender, non distended, bowel sounds present, no guarding  Extremities: Mild edema left leg with pain to palpation of the left thigh and lateral left calf  Data Reviewed: Basic Metabolic Panel:  Lab [id number] 0515 06/19/12 0618 06/18/12 0836 06/17/12 0506 06/16/12 1129  NA 139 142 143 138 141  K 3.8 4.2 4.2 3.9 4.2  CL 107 112 114* 111 112  CO2 24 24 22  18* 20  GLUCOSE 75 76 97 91 60*  BUN 10 10 13 19 23   CREATININE 1.76* 1.90* 1.96* 2.10* 2.27*  CALCIUM 8.6 8.5 8.6 8.2* 8.3*  MG -- -- -- -- --  PHOS -- -- -- -- --  Liver Function Tests:  Lab 06/19/12 0618 06/17/12 0506 06/16/12 1129 06/15/12 2024  AST 28 120* 238* 539*  ALT 85* 162* 212* 253*  ALKPHOS 99 108 119* 187*  BILITOT 0.6 1.7* 2.9* 2.1*  PROT 6.2 6.0 6.4 8.1  ALBUMIN 2.4* 2.5* 2.7* 3.6    Lab 06/17/12 0506 06/15/12 2024  LIPASE 63* 88*  AMYLASE -- --   No results found for this basename: AMMONIA:5 in the last 168 hours CBC:  Lab 06/20/12 0515 06/19/12 0618 06/18/12 0836 06/17/12 0506 06/16/12 1129 06/15/12 2024  WBC 8.1 9.0 10.9* 13.9* 19.0* --  NEUTROABS 4.7 -- -- -- -- 6.3  HGB 8.5* 8.3* 8.8* 8.6* 8.6* --  HCT 26.0* 25.8* 27.7* 27.1* 26.3* --  MCV 91.2 91.8 92.3 90.9 92.3 --  PLT 172 158 149* 133* 152 --   Cardiac Enzymes:  Lab 06/16/12 0103 06/15/12 2359 06/15/12 2024  CKTOTAL 59 --  --  CKMB 1.1 -- --  CKMBINDEX -- -- --  TROPONINI <0.30 <0.30 <0.30   BNP: No components found with this basename: POCBNP:5 CBG:  Lab 06/20/12 0751 06/19/12 2119 06/19/12 1702 06/19/12 1143 06/19/12 0749  GLUCAP 71 83 101* 90 78    Recent Results (from the past 240 hour(s))  URINE CULTURE     Status: Normal   Collection Time   06/15/12 10:32 PM      Component Value Range Status Comment   Specimen Description URINE, RANDOM   Final    Special Requests NONE   Final    Culture  Setup Time 06/16/2012 02:38   Final    Colony Count >=100,000 COLONIES/ML   Final    Culture ESCHERICHIA COLI   Final    Report Status 06/17/2012 FINAL   Final    Organism ID, Bacteria ESCHERICHIA COLI   Final   CULTURE, BLOOD (ROUTINE X 2)     Status: Normal   Collection Time   06/16/12 12:45 AM      Component Value Range Status Comment   Specimen Description BLOOD LEFT ANTECUBITAL   Final    Special Requests BOTTLES DRAWN AEROBIC AND ANAEROBIC 5CC   Final    Culture  Setup Time 06/16/2012 08:55   Final    Culture     Final    Value: STREPTOCOCCUS SPECIES     Note: Identified as Streptococcus gallolyticus     KLEBSIELLA PNEUMONIAE     Note: Gram Stain Report Called to,Read Back By and Verified With: BRIAN @ 2010 ON 06/16/12 BY GOLLD   Report Status 06/19/2012 FINAL   Final    Organism ID, Bacteria STREPTOCOCCUS SPECIES   Final    Organism ID, Bacteria KLEBSIELLA PNEUMONIAE   Final   CULTURE, BLOOD (ROUTINE X 2)     Status: Normal   Collection Time   06/16/12 12:55 AM      Component Value Range Status Comment   Specimen Description BLOOD LEFT HAND   Final    Special Requests BOTTLES DRAWN AEROBIC AND ANAEROBIC 3CC   Final    Culture  Setup Time 06/16/2012 08:55   Final    Culture     Final    Value: STREPTOCOCCUS SPECIES     Note: Identified as Streptococcus gallolyticus     KLEBSIELLA PNEUMONIAE     Note: SUSCEPTIBILITIES PERFORMED ON PREVIOUS CULTURE WITHIN THE LAST 5 DAYS.     Note: Gram Stain Report  Called to,Read Back By and Verified With: BRIAN @ 2010 ON 06/16/12 BY GOLLD  Report Status 06/19/2012 FINAL   Final   MRSA PCR SCREENING     Status: Normal   Collection Time   06/16/12  6:15 AM      Component Value Range Status Comment   MRSA by PCR NEGATIVE  NEGATIVE Final      Scheduled Meds:   . antiseptic oral rinse  15 mL Mouth Rinse q12n4p  . atorvastatin  10 mg Oral Daily  . cefTRIAXone (ROCEPHIN)  IV  2 g Intravenous Q24H  . docusate sodium  100 mg Oral BID  . escitalopram  20 mg Oral Daily  . pantoprazole  40 mg Oral Daily  . polyethylene glycol  17 g Oral Daily  . senna-docusate  1 tablet Oral BID  . DISCONTD: albuterol  2.5 mg Nebulization Q6H  . DISCONTD: insulin aspart  0-15 Units Subcutaneous TID WC  . DISCONTD: insulin aspart  0-8 Units Subcutaneous QHS  . DISCONTD: insulin aspart  0-9 Units Subcutaneous TID WC   Continuous Infusions:   . sodium chloride 1,000 mL (06/18/12 1840)  . sodium chloride 0.9 % 1,000 mL infusion       Genecis Veley, DO  [facility] Pager [phone]  If 7PM-7AM, please contact night-coverage [email or web address] Password [email or web address] 06/20/2012, 10:16 AM   LOS: 5 days

## 2012-06-20 NOTE — Progress Notes (Signed)
VASCULAR LAB PRELIMINARY  PRELIMINARY  PRELIMINARY  PRELIMINARY  Left lower extremity venous Doppler completed.    Preliminary report:  There is no DVT or SVT noted in the left lower extremity.  [name] [name], 06/20/2012, 12:06 PM

## 2012-06-21 ENCOUNTER — Inpatient Hospital Stay (HOSPITAL_COMMUNITY): Payer: Medicare Other

## 2012-06-21 LAB — CBC
HCT: 26.7 % — ABNORMAL LOW (ref 36.0–46.0)
Hemoglobin: 8.4 g/dL — ABNORMAL LOW (ref 12.0–15.0)
RDW: 15.8 % — ABNORMAL HIGH (ref 11.5–15.5)
WBC: 9 10*3/uL (ref 4.0–10.5)

## 2012-06-21 LAB — BASIC METABOLIC PANEL
Chloride: 108 mEq/L (ref 96–112)
Creatinine, Ser: 1.67 mg/dL — ABNORMAL HIGH (ref 0.50–1.10)
GFR calc Af Amer: 32 mL/min — ABNORMAL LOW (ref >90)
Potassium: 3.8 mEq/L (ref 3.5–5.1)

## 2012-06-21 LAB — GLUCOSE, CAPILLARY: Glucose-Capillary: 97 mg/dL (ref 70–99)

## 2012-06-21 MED ORDER — PEG 3350-KCL-NA BICARB-NACL 420 G PO SOLR
4000.0000 mL | Freq: Once | ORAL | Status: AC
  Administered 2012-06-22: 4000 mL via ORAL
  Filled 2012-06-21: qty 4000

## 2012-06-21 MED ORDER — SODIUM CHLORIDE 0.9 % IV SOLN: INTRAVENOUS | Status: DC

## 2012-06-21 MED ORDER — DIPHENHYDRAMINE HCL 25 MG PO CAPS
25.0000 mg | ORAL_CAPSULE | Freq: Four times a day (QID) | ORAL | Status: DC | PRN
  Administered 2012-06-21: 25 mg via ORAL
  Filled 2012-06-21: qty 1

## 2012-06-21 NOTE — Progress Notes (Signed)
Clinical Social Worker received an additional referral for potential SNF placement.  Per most recent PT recommendations, pt would benefit from a SNF placement to assist with dc needs and increasing pt's mobility.  CSW reviewed PT recommendations with pt; pt agreeable to SNF and expressed pride in the idea of improving strength and mobility.  Pt requested CSW to contact her granddtr to review recommendations.    CSW phoned pt's granddtr and reviewed recommendations.  Granddtr agreeable to plan.  Granddtr requested [facility] and expressed she is not interested in [facility] as she feels they "did nothing for her".  CSW phoned [facility] to inform them of family's wishes. [place] currently reviewing information.    CSW to continue to follow and assist as needed.   Dala Dock, MSW, [place]

## 2012-06-21 NOTE — Progress Notes (Signed)
Physical Therapy Treatment Patient Details Name: Sucely Ziegler MRN: [id number] DOB: 06-01-1930 Today's Date: 06/21/2012 Time: [id number] PT Time Calculation (min): 12 min  PT Assessment / Plan / Recommendation Comments on Treatment Session  Making good progress, able to work on amb today; Pt seemed quite pleased with her progress    Follow Up Recommendations  Skilled nursing facility (as pt isn't sure that she has 24 hour assist)    Barriers to Discharge        Equipment Recommendations  Defer to next venue    Recommendations for Other Services    Frequency Min 3X/week   Plan Discharge plan needs to be updated    Precautions / Restrictions Precautions Precautions: Fall   Pertinent Vitals/Pain Reports no pain, but is very concerned about ringing in her ears    Mobility  Transfers Transfers: Sit to Stand;Stand to Sit Sit to Stand: 3: Mod assist;With armrests;From chair/3-in-1 Stand to Sit: 3: Mod assist;With upper extremity assist;With armrests;To chair/3-in-1 Details for Transfer Assistance: Continued cues for hand placement and safety; Improving control of descent with stand to sit with cues Ambulation/Gait Ambulation/Gait Assistance: 1: +2 Total assist Ambulation/Gait: Patient Percentage: 80% Ambulation Distance (Feet): 70 Feet Assistive device: Rolling walker Ambulation/Gait Assistance Details: Noted decr L stance stability, effecting ability to advance RLE; Gait smoothed out more with more distance; Gait speed tended to increase with incr distance as well, with decr control of RW and requiring physical assist for RW management Gait Pattern: Decreased stance time - left    Exercises     PT Diagnosis:    PT Problem List:   PT Treatment Interventions:     PT Goals Acute Rehab PT Goals Time For Goal Achievement: 06/26/12 Potential to Achieve Goals: Good Pt will go Sit to Stand: with supervision;with upper extremity assist PT Goal: Sit to Stand - Progress: Progressing  toward goal Pt will Ambulate: 16 - 50 feet;with supervision;with least restrictive assistive device PT Goal: Ambulate - Progress: Progressing toward goal  Visit Information  Last PT Received On: 06/21/12 Assistance Needed: +2    Subjective Data  Subjective: Agreeable to amb Patient Stated Goal: To go home   Cognition  Overall Cognitive Status: Appears within functional limits for tasks assessed/performed Arousal/Alertness: Awake/alert Orientation Level: Appears intact for tasks assessed Behavior During Session: [facility] for tasks performed    Balance     End of Session PT - End of Session Equipment Utilized During Treatment: Gait belt Activity Tolerance: Patient tolerated treatment well Patient left: in chair;with call bell/phone within reach;with chair alarm set   GP     Roney Marion [facility] [place], [place]  06/21/2012, 10:47 AM

## 2012-06-21 NOTE — Consult Note (Signed)
Referring Provider: Dr. Carles Collet Primary Care Physician:  William Hamburger, MD Primary Gastroenterologist:  Althia Forts  Reason for Consultation:  Anemia; Strep bovis  HPI: [name] [name] is a 78 y.o. female admitted for sepsis that had bacteremia with Strep bovis and Klebsiella that has been treated and she is awaiting SNF placement. Consult called due to bacteremia with Strep bovis, which has been associated with GI malignancy. Patient thinks she has had a colonoscopy over 10 years ago but records are not available at this time. She is unable to give me any history. Denies any abdominal pain. Anemic with Hgb 8.4.  Past Medical History  Diagnosis Date  . Diabetes mellitus   . Hypertension   . Hyperlipidemia     Past Surgical History  Procedure Date  . Cholecystectomy   . Appendectomy     Prior to Admission medications   Medication Sig Start Date End Date Taking? Authorizing Provider  atorvastatin (LIPITOR) 10 MG tablet Take 10 mg by mouth daily.   Yes Historical Provider, MD  escitalopram (LEXAPRO) 20 MG tablet Take 20 mg by mouth daily.   Yes Historical Provider, MD  glipiZIDE (GLUCOTROL XL) 5 MG 24 hr tablet Take 5 mg by mouth daily.   Yes Historical Provider, MD  HYDROcodone-acetaminophen (VICODIN) 5-500 MG per tablet Take 2 tablets by mouth every 4 (four) hours as needed. pain   Yes Historical Provider, MD  LORazepam (ATIVAN) 0.5 MG tablet Take 0.25 mg by mouth 2 (two) times daily as needed. anxiety   Yes Historical Provider, MD  metoprolol (LOPRESSOR) 50 MG tablet Take 50 mg by mouth 2 (two) times daily.   Yes Historical Provider, MD  pantoprazole (PROTONIX) 40 MG tablet Take 40 mg by mouth daily.   Yes Historical Provider, MD    Scheduled Meds:   . antiseptic oral rinse  15 mL Mouth Rinse q12n4p  . atorvastatin  10 mg Oral Daily  . cefTRIAXone (ROCEPHIN)  IV  2 g Intravenous Q24H  . docusate sodium  100 mg Oral BID  . escitalopram  20 mg Oral Daily  . pantoprazole  40 mg Oral  Daily  . polyethylene glycol  17 g Oral Daily  . polyethylene glycol-electrolytes  4,000 mL Oral Once  . senna-docusate  1 tablet Oral BID   Continuous Infusions:   . sodium chloride 10 mL/hr at 06/21/12 1521  . sodium chloride    . sodium chloride 0.9 % 1,000 mL infusion 100 mL/hr at 06/20/12 1225   PRN Meds:.acetaminophen, albuterol, diphenhydrAMINE, HYDROcodone-acetaminophen, LORazepam, magnesium hydroxide, morphine injection, ondansetron (ZOFRAN) IV, ondansetron, sodium phosphate  Allergies as of 06/15/2012  . (No Known Allergies)    History reviewed. No pertinent family history.  History   Social History  . Marital Status: Widowed    Spouse Name: N/A    Number of Children: N/A  . Years of Education: N/A   Occupational History  . Not on file.   Social History Main Topics  . Smoking status: Never Smoker   . Smokeless tobacco: Not on file  . Alcohol Use: No  . Drug Use: No  . Sexually Active:    Other Topics Concern  . Not on file   Social History Narrative  . No narrative on file    Review of Systems: All negative except as stated above in HPI.  Physical Exam: Vital signs: Filed Vitals:   06/21/12 1326  BP: 144/72  Pulse: 73  Temp: 97.9 F (36.6 C)  Resp: 17   Last BM  Date: 06/20/12 General:   Alert,  Morbidly obese, pleasant and cooperative in NAD Abdomen: soft, NT, ND  Rectal:  Deferred  GI:  Lab Results:  Basename 06/21/12 0700 06/20/12 0515 06/19/12 0618  WBC 9.0 8.1 9.0  HGB 8.4* 8.5* 8.3*  HCT 26.7* 26.0* 25.8*  PLT 191 172 158   BMET  Basename 06/21/12 0700 06/20/12 0515 06/19/12 0618  NA 139 139 142  K 3.8 3.8 4.2  CL 108 107 112  CO2 22 24 24   GLUCOSE 80 75 76  BUN 10 10 10   CREATININE 1.67* 1.76* 1.90*  CALCIUM 8.8 8.6 8.5   LFT  Basename 06/19/12 0618  PROT 6.2  ALBUMIN 2.4*  AST 28  ALT 85*  ALKPHOS 99  BILITOT 0.6  BILIDIR --  IBILI --   PT/INR No results found for this basename: LABPROT:2,INR:2 in the last  72 hours   Studies/Results: No results found.  Impression/Plan: 78yo with recent Strep bovis/Klebsiella bacteremia in need of a EGD/colonoscopy to look for an occult GI malignancy. Will change to clear liquid diet and give colon prep tomorrow and plan to do the procedures Wed if consent is obtained from her granddaughter.    LOS: 6 days   [place] [facility].  06/21/2012, 3:30 PM

## 2012-06-21 NOTE — Clinical Social Work Placement (Signed)
     Clinical Social Work Department CLINICAL SOCIAL WORK PLACEMENT NOTE 06/21/2012  Patient:  [name] [name], [name] [name]  Account Number:  [id number] Admit date:  06/15/2012  Clinical Social Worker:  Katrinka Blazing  Date/time:  06/21/2012 01:51 PM  Clinical Social Work is seeking post-discharge placement for this patient at the following level of care:   [place]   (*CSW will update this form in Epic as items are completed)   06/21/2012  Patient/family provided with [place] Department of Clinical Social Works list of facilities offering this level of care within the geographic area requested by the patient (or if unable, by the patients family).  06/21/2012  Patient/family informed of their freedom to choose among providers that offer the needed level of care, that participate in Medicare, Medicaid or managed care program needed by the patient, have an available bed and are willing to accept the patient.  06/21/2012  Patient/family informed of [facility] ownership interest in [facility], as well as of the fact that they are under no obligation to receive care at this facility.  PASARR submitted to EDS on 06/21/2012 PASARR number received from EDS on 06/21/2012  FL2 transmitted to all facilities in geographic area requested by pt/family on  06/21/2012 FL2 transmitted to all facilities within larger geographic area on   Patient informed that his/her managed care company has contracts with or will negotiate with  certain facilities, including the following:     Patient/family informed of bed offers received:   Patient chooses bed at  Physician recommends and patient chooses bed at    Patient to be transferred to  on   Patient to be transferred to facility by   The following physician request were entered in Epic:   Additional Comments: Family prefers [facility].

## 2012-06-21 NOTE — Procedures (Signed)
Central Venous Catheter Insertion Procedure Note [name] [name] [id number] [birth year]  Procedure: Insertion of Central Venous Catheter Indications: Drug and/or fluid administration  Procedure Details Consent: Risks of procedure as well as the alternatives and risks of each were explained to the (patient/caregiver).  Consent for procedure obtained. Time Out: Verified patient identification, verified procedure, site/side was marked, verified correct patient position, special equipment/implants available, medications/allergies/relevent history reviewed, required imaging and test results available.  Performed  Maximum sterile technique was used including antiseptics, cap, gloves, gown, hand hygiene, mask and sheet. Skin prep: Chlorhexidine; local anesthetic administered A triple lumen catheter was placed in the left internal jugular vein using the Seldinger technique.  Evaluation Blood flow good Complications: No apparent complications Patient did tolerate procedure well. Chest X-ray ordered to verify placement.  CXR: pending.   Procedure performed under direct supervision of Dr. Oliver Pila and with ultrasound guidance.     Noe Gens, NP-C [facility] Pgr: [phone] or [phone]  Supervised and present for entire procedure.    Penne Lash, M.D., F.C.C.P. Pulmonary and [place] Cell: [phone] Pager: [phone]

## 2012-06-21 NOTE — Progress Notes (Signed)
Occupational Therapy Evaluation Patient Details Name: [name] [name] MRN: [id number] DOB: 04/02/1930 Today's Date: 06/21/2012 Time:  - 1640 - 1700 20 min    OT Assessment / Plan / Recommendation Clinical Impression  pt is 78 yo who is overall deconditioned. Pt lives with grand daughter, who is not currently working. Pt will need SNF for rehab before returning home. Pt has CAP worker who helps 6-7 days/week, 3 hrs/day. Pt has all nec DME. Will defer further OT to SNF. Ot signing off.    OT Assessment  All further OT needs can be met in the next venue of care    Follow Up Recommendations  Skilled nursing facility    Barriers to Discharge      Equipment Recommendations  None recommended by OT    Recommendations for Other Services  none  Frequency    defer OT tx to SNF   Precautions / Restrictions Precautions Precautions: Fall   Pertinent Vitals/Pain none    ADL  Eating/Feeding: Simulated;Independent Where Assessed - Eating/Feeding: Bed level Grooming: Simulated;Set up Where Assessed - Grooming: Supported sitting Upper Body Bathing: Simulated;Minimal assistance Where Assessed - Upper Body Bathing: Supine, head of bed up Lower Body Bathing: Simulated;+1 Total assistance Where Assessed - Lower Body Bathing: Supine, head of bed up ADL Comments: limited assessment due to recent lacement of central line.     OT Diagnosis:    OT Problem List: Decreased strength;Decreased activity tolerance;Decreased knowledge of use of DME or AE;Obesity OT Treatment Interventions:     OT Goals Acute Rehab OT Goals OT Goal Formulation:  (eval only)  Visit Information  Last OT Received On: 06/21/12    Subjective Data   Im going to rehab for a couple of weeks.   Prior Functioning  Vision/Perception  Home Living Lives With: Family;Other (Comment) (grand daughter) Available Help at Discharge: Available 24 hours/day;Available PRN/intermittently (unsure of 24/hr status) Type of Home:  House Home Access: Ramped entrance Home Layout: One level Bathroom Shower/Tub: Chiropodist: Standard Bathroom Accessibility: Yes How Accessible: Accessible via walker Home Adaptive Equipment: Walker - rolling;Wheelchair - manual;Hospital bed Additional Comments: Has been in a wc @ 15 years - mod i level with mobility Prior Function Level of Independence: Independent with assistive device(s);Needs assistance Needs Assistance: Bathing;Dressing;Feeding;Grooming;Toileting Bath: Total Dressing: Moderate Feeding: Supervision/set-up Grooming: Supervision/set-up Toileting: Moderate Able to Take Stairs?: No Driving: No Vocation: 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 Dominant Hand: Right      Cognition  Overall Cognitive Status: Appears within functional limits for tasks assessed/performed Arousal/Alertness: Awake/alert Orientation Level: Appears intact for tasks assessed Behavior During Session: [facility] for tasks performed    Extremity/Trunk Assessment Right Upper Extremity Assessment RUE ROM/Strength/Tone: [facility] for tasks assessed Left Upper Extremity Assessment LUE ROM/Strength/Tone: WFL for tasks assessed   Mobility     Exercise    Balance    End of Session OT - End of Session Activity Tolerance: Patient tolerated treatment well Patient left: in bed;with call bell/phone within reach Nurse Communication: Other (comment) (precautions with central line)  GO     Minal Stuller,HILLARY 06/21/2012, 5:02 PM [facility], OTR/L  [phone] 06/21/2012

## 2012-06-21 NOTE — Progress Notes (Signed)
[facility] PROGRESS NOTE  [name] [name] [id number] DOB: 04-23-1930 DOA: 06/15/2012   Assessment/Plan:  [facility]  PROGRESS NOTE  [name] [name] [id number] DOB: 1930-07-19 DOA: 06/15/2012  PCP: William Hamburger, MD  Assessment/Plan:  Sepsis  -Continue ceftriaxone to 2 g IV daily  -Patient remains afebrile and hemodynamically stable  Bacteremia  -Klebsiella pneumoniae and Streptococcus gallolyticus  -Plan to continue IV ceftriaxone to 07/02/2012 -Place a central line today -Due to patient's poor outpatient followup, consult GI for endoscopy -Repeat blood cultures negative Bacteruria  -Escherichia coli in the urine  Left leg pain  -Venous ultrasound left leg negative Diabetes mellitus type 2, well controlled  -Hemoglobin A1c 5.6 on August 7  -Discontinued Accu-Cheks  Anemia  -Hemoglobin has remained stable between 8 and 9.0   Deconditioning  -PTOT at skilled nursing facility    LOS: 6 days   Subjective: Patient is in good spirits today. She denies any fevers, chills, chest pain, shortness of breath, nausea, vomiting, abdominal pain.  Objective: Filed Vitals:   06/20/12 2200 06/21/12 0614 06/21/12 0934 06/21/12 1326  BP: 137/81 133/72 144/74 144/72  Pulse: 75 72 77 73  Temp: 98.4 F (36.9 C) 99.2 F (37.3 C) 97.6 F (36.4 C) 97.9 F (36.6 C)  TempSrc: Oral Oral Oral Oral  Resp: 16 16 20 17   Height:      Weight: 115 kg (253 lb 8.5 oz)     SpO2: 95% 96% 96% 99%    Intake/Output Summary (Last 24 hours) at 06/21/12 1526 Last data filed at 06/21/12 1327  Gross per 24 hour  Intake    420 ml  Output    100 ml  Net    320 ml   Weight change: 0.3 kg (10.6 oz)  Exam:  General: Alert, awake, oriented x3, in no acute distress.  HEENT: No bruits, no goiter.  Heart: Regular rate and rhythm, without murmurs, rubs, gallops.  Lungs: Good air movement, bilateral air movement.  Abdomen: Soft, nontender, nondistended, positive bowel sounds.  Neuro: Grossly  intact, nonfocal.   Data Reviewed: Basic Metabolic Panel:  Lab [id number] 0700 06/20/12 0515 06/19/12 0618 06/18/12 0836 06/17/12 0506  NA 139 139 142 143 138  K 3.8 3.8 -- -- --  CL 108 107 112 114* 111  CO2 22 24 24 22  18*  GLUCOSE 80 75 76 97 91  BUN 10 10 10 13 19   CREATININE 1.67* 1.76* 1.90* 1.96* 2.10*  CALCIUM 8.8 8.6 8.5 8.6 8.2*  MG -- -- -- -- --  PHOS -- -- -- -- --   Liver Function Tests:  Lab 06/19/12 0618 06/17/12 0506 06/16/12 1129 06/15/12 2024  AST 28 120* 238* 539*  ALT 85* 162* 212* 253*  ALKPHOS 99 108 119* 187*  BILITOT 0.6 1.7* 2.9* 2.1*  PROT 6.2 6.0 6.4 8.1  ALBUMIN 2.4* 2.5* 2.7* 3.6    Lab 06/17/12 0506 06/15/12 2024  LIPASE 63* 88*  AMYLASE -- --   No results found for this basename: AMMONIA:5 in the last 168 hours CBC:  Lab 06/21/12 0700 06/20/12 0515 06/19/12 0618 06/18/12 0836 06/17/12 0506 06/15/12 2024  WBC 9.0 8.1 9.0 10.9* 13.9* --  NEUTROABS -- 4.7 -- -- -- 6.3  HGB 8.4* 8.5* 8.3* 8.8* 8.6* --  HCT 26.7* 26.0* 25.8* 27.7* 27.1* --  MCV 90.2 91.2 91.8 92.3 90.9 --  PLT 191 172 158 149* 133* --   Cardiac Enzymes:  Lab 06/16/12 0103 06/15/12 2359 06/15/12 2024  CKTOTAL 59 -- --  CKMB 1.1 -- --  CKMBINDEX -- -- --  TROPONINI <0.30 <0.30 <0.30   BNP: No components found with this basename: POCBNP:5 CBG:  Lab 06/21/12 1151 06/21/12 0750 06/20/12 2200 06/20/12 1639 06/20/12 1219  GLUCAP 103* 79 137* 117* 92    Recent Results (from the past 240 hour(s))  URINE CULTURE     Status: Normal   Collection Time   06/15/12 10:32 PM      Component Value Range Status Comment   Specimen Description URINE, RANDOM   Final    Special Requests NONE   Final    Culture  Setup Time 06/16/2012 02:38   Final    Colony Count >=100,000 COLONIES/ML   Final    Culture ESCHERICHIA COLI   Final    Report Status 06/17/2012 FINAL   Final    Organism ID, Bacteria ESCHERICHIA COLI   Final   CULTURE, BLOOD (ROUTINE X 2)     Status: Normal   Collection  Time   06/16/12 12:45 AM      Component Value Range Status Comment   Specimen Description BLOOD LEFT ANTECUBITAL   Final    Special Requests BOTTLES DRAWN AEROBIC AND ANAEROBIC 5CC   Final    Culture  Setup Time 06/16/2012 08:55   Final    Culture     Final    Value: STREPTOCOCCUS SPECIES     Note: Identified as Streptococcus gallolyticus     KLEBSIELLA PNEUMONIAE     Note: Gram Stain Report Called to,Read Back By and Verified With: BRIAN @ 2010 ON 06/16/12 BY GOLLD   Report Status 06/19/2012 FINAL   Final    Organism ID, Bacteria STREPTOCOCCUS SPECIES   Final    Organism ID, Bacteria KLEBSIELLA PNEUMONIAE   Final   CULTURE, BLOOD (ROUTINE X 2)     Status: Normal   Collection Time   06/16/12 12:55 AM      Component Value Range Status Comment   Specimen Description BLOOD LEFT HAND   Final    Special Requests BOTTLES DRAWN AEROBIC AND ANAEROBIC 3CC   Final    Culture  Setup Time 06/16/2012 08:55   Final    Culture     Final    Value: STREPTOCOCCUS SPECIES     Note: Identified as Streptococcus gallolyticus     KLEBSIELLA PNEUMONIAE     Note: SUSCEPTIBILITIES PERFORMED ON PREVIOUS CULTURE WITHIN THE LAST 5 DAYS.     Note: Gram Stain Report Called to,Read Back By and Verified With: BRIAN @ 2010 ON 06/16/12 BY GOLLD   Report Status 06/19/2012 FINAL   Final   MRSA PCR SCREENING     Status: Normal   Collection Time   06/16/12  6:15 AM      Component Value Range Status Comment   MRSA by PCR NEGATIVE  NEGATIVE Final   CULTURE, BLOOD (ROUTINE X 2)     Status: Normal (Preliminary result)   Collection Time   06/20/12 11:05 AM      Component Value Range Status Comment   Specimen Description BLOOD RIGHT ARM   Final    Special Requests BOTTLES DRAWN AEROBIC AND ANAEROBIC 10CC   Final    Culture  Setup Time 06/20/2012 21:07   Final    Culture     Final    Value:        BLOOD CULTURE RECEIVED NO GROWTH TO DATE CULTURE WILL BE HELD FOR 5 DAYS BEFORE ISSUING A FINAL NEGATIVE REPORT  Report Status PENDING    Incomplete   CULTURE, BLOOD (ROUTINE X 2)     Status: Normal (Preliminary result)   Collection Time   06/20/12 11:25 AM      Component Value Range Status Comment   Specimen Description BLOOD RIGHT HAND   Final    Special Requests BOTTLES DRAWN AEROBIC AND ANAEROBIC 10CC   Final    Culture  Setup Time 06/20/2012 21:08   Final    Culture     Final    Value:        BLOOD CULTURE RECEIVED NO GROWTH TO DATE CULTURE WILL BE HELD FOR 5 DAYS BEFORE ISSUING A FINAL NEGATIVE REPORT   Report Status PENDING   Incomplete      Studies: Ct Abdomen Pelvis Wo Contrast  06/16/2012  *RADIOLOGY REPORT*  Clinical Data: Nausea, vomiting, chills, and weakness.  Abdominal pain.  Elevated liver function tests and lipase.  CT ABDOMEN AND PELVIS WITHOUT CONTRAST  Technique:  Multidetector CT imaging of the abdomen and pelvis was performed following the standard protocol without intravenous contrast.  Comparison: None.  Findings: Motion artifact limits visualization of the lung bases but there appear to be multiple small nodules present bilaterally, measuring up to about 4 mm diameter.  Nodules are indeterminate. If the patient is at high risk for bronchogenic carcinoma, follow-up chest CT at 1 year is recommended.  If the patient is at low risk, no follow-up is needed.  This recommendation follows the consensus statement: Guidelines for Management of Small Pulmonary Nodules Detected on CT Scans:  A Statement from the [place] as published in Radiology 2005; 237:395-400.  The unenhanced appearance of the liver, spleen, pancreas, adrenal glands, kidneys, abdominal aorta, and retroperitoneal lymph nodes is unremarkable.  Surgical absence of the gallbladder.  Mild infiltration or edema in the pararenal fat bilaterally and in the subcutaneous fat. No free fluid or free air in the abdomen.  The stomach, small bowel, and colon are not abnormally distended.  Pelvis:  The uterus and adnexal structures are not significantly  enlarged.  Calcifications in the uterus suggesting small fibroids. Bladder wall is not thickened.  Small amount of gas in the bladder may represent infection or catheterization.  No free or loculated pelvic fluid collections.  No significant pelvic lymphadenopathy. The appendix is not identified.  Degenerative changes in the lumbar spine.  IMPRESSION: No definite acute process demonstrated on unenhanced imaging of the abdomen and pelvis.  Small indeterminate nodules in the lung bases. Small focus of gas in the bladder which could represent infection or catheterization.  Original Report Authenticated By: Neale Burly, M.D.   Dg Chest 2 View  06/15/2012  *RADIOLOGY REPORT*  Clinical Data: Generalized body aches  CHEST - 2 VIEW  Comparison: None.  Findings: The lung volumes are low.  There is enlargement of central pulmonary vessels probably as a result of low lung volumes. There is no focal pneumonia or pleural effusion.  The aorta is uncoiled.  The heart size is enlarged.  The soft tissue osseous structures are unremarkable.  IMPRESSION: Low lung volumes.  Cardiomegaly.  No focal pneumonia.  Original Report Authenticated By: Abelardo Diesel, M.D.   US Abdomen Complete  06/16/2012  *RADIOLOGY REPORT*  Clinical Data:  Vomiting and mid epigastric pain.  COMPLETE ABDOMINAL ULTRASOUND  Comparison:  None.  Findings:  Gallbladder:  Surgical absence of the gallbladder.  Common bile duct:  Mild prominence of bile ducts with diameter measured at 9 mm.  This  is likely normal in a postoperative patient.  Liver:  Somewhat limited visualization of the liver.  Segmental images are available demonstrating no focal lesion and normal parenchymal echotexture.  IVC:  Appears normal.  Pancreas:  Pancreas is obscured by overlying bowel gas is not well visualized.  Spleen:  Spleen length measures about 11 cm.  Normal parenchymal echotexture.  Right Kidney:  Right kidney measures 11.4 cm length.  No hydronephrosis.  Left Kidney:   Left kidney measures 12 cm length.  No hydronephrosis.  Abdominal aorta:  No aneurysm identified.  IMPRESSION: Somewhat technically limited study due to patient body habitus and bowel gas.  Surgical absence of the gallbladder.  Mild physiologic dilatation of bile ducts.  No acute process suggested.  Original Report Authenticated By: Neale Burly, M.D.    Scheduled Meds:   . antiseptic oral rinse  15 mL Mouth Rinse q12n4p  . atorvastatin  10 mg Oral Daily  . cefTRIAXone (ROCEPHIN)  IV  2 g Intravenous Q24H  . docusate sodium  100 mg Oral BID  . escitalopram  20 mg Oral Daily  . pantoprazole  40 mg Oral Daily  . polyethylene glycol  17 g Oral Daily  . senna-docusate  1 tablet Oral BID   Continuous Infusions:   . sodium chloride 10 mL/hr at 06/21/12 1521  . sodium chloride 0.9 % 1,000 mL infusion 100 mL/hr at 06/20/12 1225    Orson Eva, DO  [facility] Pager [phone]  If 7PM-7AM, please contact night-coverage [email or web address] Password [facility] 06/21/2012, 3:26 PM

## 2012-06-22 NOTE — Progress Notes (Signed)
[facility] PROGRESS NOTE  [name] [name] [id number] DOB: Jul 20, 1930 DOA: 06/15/2012 PCP: William Hamburger, MD  Assessment/Plan:  Sepsis  -Continue ceftriaxone to 2 g IV daily  -Patient remains afebrile and hemodynamically stable  -central line placed 8/12 for IV abx at skilled nursing facility (SNF) -last day of antibiotics 07/02/12 -write order to d/c central line at SNF on 8/23 on D/C summary -can possibly be discharged after endoscopy 8/14 if negative Bacteremia  -Klebsiella pneumoniae and Streptococcus gallolyticus  -repeat blood cultures neg -Echo negative for vegetation -EGD and colonoscopy planned on 8/14 Bacteruria  -Escherichia coli in the urine  Left leg pain  -venous u/s neg for DVT Diabetes mellitus type 2, well controlled  -Hemoglobin A1c 5.6 on August 7  -Discontinued Accu-Cheks  Anemia  -Hemoglobin has remained stable between 8 and 9.0  Renal failure  -Give 1 L normal saline  Deconditioning  -Ultimate plan is to discharge the patient to a skilled nursing facility for PT OT  -I discussed with her granddaughter, her primary caregiver who is agreeable  -She already has a bed offer  Antibiotics:  Zosyn August 7 to August 9  Vancomycin August 7 to August 9  Ceftriaxone August 9>>>  Procedures/Studies: Ct Abdomen Pelvis Wo Contrast  06/16/2012  *RADIOLOGY REPORT*  Clinical Data: Nausea, vomiting, chills, and weakness.  Abdominal pain.  Elevated liver function tests and lipase.  CT ABDOMEN AND PELVIS WITHOUT CONTRAST  Technique:  Multidetector CT imaging of the abdomen and pelvis was performed following the standard protocol without intravenous contrast.  Comparison: None.  Findings: Motion artifact limits visualization of the lung bases but there appear to be multiple small nodules present bilaterally, measuring up to about 4 mm diameter.  Nodules are indeterminate. If the patient is at high risk for bronchogenic carcinoma, follow-up chest CT at 1 year is  recommended.  If the patient is at low risk, no follow-up is needed.  This recommendation follows the consensus statement: Guidelines for Management of Small Pulmonary Nodules Detected on CT Scans:  A Statement from the [place] as published in Radiology 2005; 237:395-400.  The unenhanced appearance of the liver, spleen, pancreas, adrenal glands, kidneys, abdominal aorta, and retroperitoneal lymph nodes is unremarkable.  Surgical absence of the gallbladder.  Mild infiltration or edema in the pararenal fat bilaterally and in the subcutaneous fat. No free fluid or free air in the abdomen.  The stomach, small bowel, and colon are not abnormally distended.  Pelvis:  The uterus and adnexal structures are not significantly enlarged.  Calcifications in the uterus suggesting small fibroids. Bladder wall is not thickened.  Small amount of gas in the bladder may represent infection or catheterization.  No free or loculated pelvic fluid collections.  No significant pelvic lymphadenopathy. The appendix is not identified.  Degenerative changes in the lumbar spine.  IMPRESSION: No definite acute process demonstrated on unenhanced imaging of the abdomen and pelvis.  Small indeterminate nodules in the lung bases. Small focus of gas in the bladder which could represent infection or catheterization.  Original Report Authenticated By: Neale Burly, M.D.   Dg Chest 2 View  06/15/2012  *RADIOLOGY REPORT*  Clinical Data: Generalized body aches  CHEST - 2 VIEW  Comparison: None.  Findings: The lung volumes are low.  There is enlargement of central pulmonary vessels probably as a result of low lung volumes. There is no focal pneumonia or pleural effusion.  The aorta is uncoiled.  The heart size is enlarged.  The soft tissue osseous  structures are unremarkable.  IMPRESSION: Low lung volumes.  Cardiomegaly.  No focal pneumonia.  Original Report Authenticated By: Abelardo Diesel, M.D.   US Abdomen Complete  06/16/2012   *RADIOLOGY REPORT*  Clinical Data:  Vomiting and mid epigastric pain.  COMPLETE ABDOMINAL ULTRASOUND  Comparison:  None.  Findings:  Gallbladder:  Surgical absence of the gallbladder.  Common bile duct:  Mild prominence of bile ducts with diameter measured at 9 mm.  This is likely normal in a postoperative patient.  Liver:  Somewhat limited visualization of the liver.  Segmental images are available demonstrating no focal lesion and normal parenchymal echotexture.  IVC:  Appears normal.  Pancreas:  Pancreas is obscured by overlying bowel gas is not well visualized.  Spleen:  Spleen length measures about 11 cm.  Normal parenchymal echotexture.  Right Kidney:  Right kidney measures 11.4 cm length.  No hydronephrosis.  Left Kidney:  Left kidney measures 12 cm length.  No hydronephrosis.  Abdominal aorta:  No aneurysm identified.  IMPRESSION: Somewhat technically limited study due to patient body habitus and bowel gas.  Surgical absence of the gallbladder.  Mild physiologic dilatation of bile ducts.  No acute process suggested.  Original Report Authenticated By: Neale Burly, M.D.   Dg Chest Port 1 View  06/21/2012  *RADIOLOGY REPORT*  Clinical Data: Right IJ catheter placement.  PORTABLE CHEST - 1 VIEW  Comparison: PA and lateral chest 06/15/2012.  Findings: New right IJ catheter is in place with the tip projecting in the lower superior vena cava.  No pneumothorax identified. Heart size is upper normal.  Basilar atelectasis is noted.  IMPRESSION: Right IJ catheter tip projects over the lower superior vena cava. No pneumothorax or acute abnormality.  Original Report Authenticated By: Arvid Right. Luther Parody, M.D.      Code Status: Full Family Communication: Pt at bedside Disposition Plan: Home when medically stable  Subjective: Patient is feeling well today. She denies any fevers, chills, chest pain, abdominal pain, nausea, vomiting, diarrhea.  Objective: Filed Vitals:   06/22/12 0556 06/22/12 0929  06/22/12 1415 06/22/12 1658  BP: 139/69 162/87 121/74 143/73  Pulse: 67 81 84 72  Temp: 98.4 F (36.9 C) 99 F (37.2 C) 98.6 F (37 C) 97.7 F (36.5 C)  TempSrc: Oral Oral Oral Oral  Resp: 20 21 20 20   Height:      Weight:      SpO2: 97% 93% 95% 99%    Intake/Output Summary (Last 24 hours) at 06/22/12 1713 Last data filed at 06/22/12 1300  Gross per 24 hour  Intake    480 ml  Output    600 ml  Net   -120 ml   Weight change: 1.2 kg (2 lb 10.3 oz) Exam:   General:  Pt is alert, follows commands appropriately, not in acute distress  HEENT: No icterus, No thrush, No neck mass, [place]/AT  Cardiovascular: Regular rate and rhythm, S1/S2, , no rubs, no gallops  Respiratory: Clear to auscultation bilaterally, no wheezing, no crackles, no rhonchi  Abdomen: Soft, non tender, non distended, bowel sounds present, no guarding  Extremities: No edema, pulses DP and PT palpable bilaterally  Data Reviewed: Basic Metabolic Panel:  Lab [id number] 0700 06/20/12 0515 06/19/12 0618 06/18/12 0836 06/17/12 0506  NA 139 139 142 143 138  K 3.8 3.8 4.2 4.2 3.9  CL 108 107 112 114* 111  CO2 22 24 24 22  18*  GLUCOSE 80 75 76 97 91  BUN 10 10 10  13  19  CREATININE 1.67* 1.76* 1.90* 1.96* 2.10*  CALCIUM 8.8 8.6 8.5 8.6 8.2*  MG -- -- -- -- --  PHOS -- -- -- -- --   Liver Function Tests:  Lab 06/19/12 0618 06/17/12 0506 06/16/12 1129 06/15/12 2024  AST 28 120* 238* 539*  ALT 85* 162* 212* 253*  ALKPHOS 99 108 119* 187*  BILITOT 0.6 1.7* 2.9* 2.1*  PROT 6.2 6.0 6.4 8.1  ALBUMIN 2.4* 2.5* 2.7* 3.6    Lab 06/17/12 0506 06/15/12 2024  LIPASE 63* 88*  AMYLASE -- --   No results found for this basename: AMMONIA:5 in the last 168 hours CBC:  Lab 06/21/12 0700 06/20/12 0515 06/19/12 0618 06/18/12 0836 06/17/12 0506 06/15/12 2024  WBC 9.0 8.1 9.0 10.9* 13.9* --  NEUTROABS -- 4.7 -- -- -- 6.3  HGB 8.4* 8.5* 8.3* 8.8* 8.6* --  HCT 26.7* 26.0* 25.8* 27.7* 27.1* --  MCV 90.2 91.2 91.8 92.3  90.9 --  PLT 191 172 158 149* 133* --   Cardiac Enzymes:  Lab 06/16/12 0103 06/15/12 2359 06/15/12 2024  CKTOTAL 59 -- --  CKMB 1.1 -- --  CKMBINDEX -- -- --  TROPONINI <0.30 <0.30 <0.30   BNP: No components found with this basename: POCBNP:5 CBG:  Lab 06/22/12 1151 06/22/12 0740 06/21/12 2100 06/21/12 1708 06/21/12 1151  GLUCAP 97 94 99 97 103*    Recent Results (from the past 240 hour(s))  URINE CULTURE     Status: Normal   Collection Time   06/15/12 10:32 PM      Component Value Range Status Comment   Specimen Description URINE, RANDOM   Final    Special Requests NONE   Final    Culture  Setup Time 06/16/2012 02:38   Final    Colony Count >=100,000 COLONIES/ML   Final    Culture ESCHERICHIA COLI   Final    Report Status 06/17/2012 FINAL   Final    Organism ID, Bacteria ESCHERICHIA COLI   Final   CULTURE, BLOOD (ROUTINE X 2)     Status: Normal   Collection Time   06/16/12 12:45 AM      Component Value Range Status Comment   Specimen Description BLOOD LEFT ANTECUBITAL   Final    Special Requests BOTTLES DRAWN AEROBIC AND ANAEROBIC 5CC   Final    Culture  Setup Time 06/16/2012 08:55   Final    Culture     Final    Value: STREPTOCOCCUS SPECIES     Note: Identified as Streptococcus gallolyticus     KLEBSIELLA PNEUMONIAE     Note: Gram Stain Report Called to,Read Back By and Verified With: BRIAN @ 2010 ON 06/16/12 BY GOLLD   Report Status 06/19/2012 FINAL   Final    Organism ID, Bacteria STREPTOCOCCUS SPECIES   Final    Organism ID, Bacteria KLEBSIELLA PNEUMONIAE   Final   CULTURE, BLOOD (ROUTINE X 2)     Status: Normal   Collection Time   06/16/12 12:55 AM      Component Value Range Status Comment   Specimen Description BLOOD LEFT HAND   Final    Special Requests BOTTLES DRAWN AEROBIC AND ANAEROBIC 3CC   Final    Culture  Setup Time 06/16/2012 08:55   Final    Culture     Final    Value: STREPTOCOCCUS SPECIES     Note: Identified as Streptococcus gallolyticus      KLEBSIELLA PNEUMONIAE     Note: SUSCEPTIBILITIES  PERFORMED ON PREVIOUS CULTURE WITHIN THE LAST 5 DAYS.     Note: Gram Stain Report Called to,Read Back By and Verified With: BRIAN @ 2010 ON 06/16/12 BY GOLLD   Report Status 06/19/2012 FINAL   Final   MRSA PCR SCREENING     Status: Normal   Collection Time   06/16/12  6:15 AM      Component Value Range Status Comment   MRSA by PCR NEGATIVE  NEGATIVE Final   CULTURE, BLOOD (ROUTINE X 2)     Status: Normal (Preliminary result)   Collection Time   06/20/12 11:05 AM      Component Value Range Status Comment   Specimen Description BLOOD RIGHT ARM   Final    Special Requests BOTTLES DRAWN AEROBIC AND ANAEROBIC 10CC   Final    Culture  Setup Time 06/20/2012 21:07   Final    Culture     Final    Value:        BLOOD CULTURE RECEIVED NO GROWTH TO DATE CULTURE WILL BE HELD FOR 5 DAYS BEFORE ISSUING A FINAL NEGATIVE REPORT   Report Status PENDING   Incomplete   CULTURE, BLOOD (ROUTINE X 2)     Status: Normal (Preliminary result)   Collection Time   06/20/12 11:25 AM      Component Value Range Status Comment   Specimen Description BLOOD RIGHT HAND   Final    Special Requests BOTTLES DRAWN AEROBIC AND ANAEROBIC 10CC   Final    Culture  Setup Time 06/20/2012 21:08   Final    Culture     Final    Value:        BLOOD CULTURE RECEIVED NO GROWTH TO DATE CULTURE WILL BE HELD FOR 5 DAYS BEFORE ISSUING A FINAL NEGATIVE REPORT   Report Status PENDING   Incomplete      Scheduled Meds:   . antiseptic oral rinse  15 mL Mouth Rinse q12n4p  . atorvastatin  10 mg Oral Daily  . cefTRIAXone (ROCEPHIN)  IV  2 g Intravenous Q24H  . docusate sodium  100 mg Oral BID  . escitalopram  20 mg Oral Daily  . pantoprazole  40 mg Oral Daily  . polyethylene glycol  17 g Oral Daily  . polyethylene glycol-electrolytes  4,000 mL Oral Once  . senna-docusate  1 tablet Oral BID   Continuous Infusions:   . sodium chloride 10 mL/hr at 06/21/12 1521  . sodium chloride    . sodium  chloride 0.9 % 1,000 mL infusion 100 mL/hr at 06/20/12 1225     Callen Zuba, DO  [facility] Pager [phone]  If 7PM-7AM, please contact night-coverage [email or web address] Password [email or web address] 06/22/2012, 5:13 PM   LOS: 7 days

## 2012-06-22 NOTE — Progress Notes (Signed)
Clinical Social Worker spoke with pt's granddtr and informed her that [facility] does have a bed available for pt once she is medically stable.  CSW to continue to follow and assist as needed.   Dala Dock, MSW, [place]

## 2012-06-23 ENCOUNTER — Encounter (HOSPITAL_COMMUNITY): Payer: Self-pay

## 2012-06-23 ENCOUNTER — Encounter (HOSPITAL_COMMUNITY)
Admission: EM | Disposition: A | Discharge status: Skilled Nursing Facility | Payer: Self-pay | Source: Home / Self Care | Attending: Internal Medicine

## 2012-06-23 DIAGNOSIS — E785 Hyperlipidemia, unspecified: Secondary | ICD-10-CM

## 2012-06-23 DIAGNOSIS — I10 Essential (primary) hypertension: Secondary | ICD-10-CM

## 2012-06-23 DIAGNOSIS — D649 Anemia, unspecified: Secondary | ICD-10-CM | POA: Diagnosis present

## 2012-06-23 HISTORY — PX: COLONOSCOPY: SHX5424

## 2012-06-23 HISTORY — PX: ESOPHAGOGASTRODUODENOSCOPY: SHX5428

## 2012-06-23 LAB — CBC
HCT: 27.1 % — ABNORMAL LOW (ref 36.0–46.0)
MCV: 91.2 fL (ref 78.0–100.0)
RBC: 2.97 MIL/uL — ABNORMAL LOW (ref 3.87–5.11)
RDW: 15.9 % — ABNORMAL HIGH (ref 11.5–15.5)
WBC: 9.7 10*3/uL (ref 4.0–10.5)

## 2012-06-23 LAB — BASIC METABOLIC PANEL
BUN: 6 mg/dL (ref 6–23)
CO2: 28 mEq/L (ref 19–32)
Chloride: 104 mEq/L (ref 96–112)
Creatinine, Ser: 1.65 mg/dL — ABNORMAL HIGH (ref 0.50–1.10)
Glucose, Bld: 86 mg/dL (ref 70–99)

## 2012-06-23 SURGERY — EGD (ESOPHAGOGASTRODUODENOSCOPY)
Anesthesia: Moderate Sedation

## 2012-06-23 MED ORDER — HYDROCODONE-ACETAMINOPHEN 5-500 MG PO TABS: 2.0000 | ORAL_TABLET | ORAL | Status: DC | PRN

## 2012-06-23 MED ORDER — HEPARIN (PORCINE) LOCK FLUSH 10 UNIT/ML IV SOLN
10.0000 [IU] | Freq: Once | INTRAVENOUS | Status: DC
  Filled 2012-06-23: qty 1

## 2012-06-23 MED ORDER — FENTANYL CITRATE 0.05 MG/ML IJ SOLN
INTRAMUSCULAR | Status: DC | PRN
  Administered 2012-06-23 (×2): 12.5 ug via INTRAVENOUS
  Administered 2012-06-23 (×3): 25 ug via INTRAVENOUS

## 2012-06-23 MED ORDER — DIPHENHYDRAMINE HCL 25 MG PO CAPS: 25.0000 mg | ORAL_CAPSULE | Freq: Four times a day (QID) | ORAL | Status: DC | PRN

## 2012-06-23 MED ORDER — SODIUM CHLORIDE 0.9 % IJ SOLN
10.0000 mL | INTRAMUSCULAR | Status: DC | PRN
  Administered 2012-06-23: 30 mL

## 2012-06-23 MED ORDER — ACETAMINOPHEN 325 MG PO TABS: 650.0000 mg | ORAL_TABLET | Freq: Four times a day (QID) | ORAL | Status: AC | PRN

## 2012-06-23 MED ORDER — FENTANYL CITRATE 0.05 MG/ML IJ SOLN
INTRAMUSCULAR | Status: AC
  Filled 2012-06-23: qty 2

## 2012-06-23 MED ORDER — HEPARIN (PORCINE) LOCK FLUSH 10 UNIT/ML IV SOLN
10.0000 [IU] | Freq: Once | INTRAVENOUS | Status: AC
  Administered 2012-06-23: 30 [IU]
  Filled 2012-06-23: qty 1

## 2012-06-23 MED ORDER — HEPARIN (PORCINE) LOCK FLUSH 10 UNIT/ML IV SOLN
10.0000 [IU] | Freq: Two times a day (BID) | INTRAVENOUS | Status: DC
  Filled 2012-06-23: qty 1

## 2012-06-23 MED ORDER — DSS 100 MG PO CAPS: 100.0000 mg | ORAL_CAPSULE | Freq: Two times a day (BID) | ORAL | Status: AC | PRN

## 2012-06-23 MED ORDER — DIPHENHYDRAMINE HCL 50 MG/ML IJ SOLN
INTRAMUSCULAR | Status: AC
  Filled 2012-06-23: qty 1

## 2012-06-23 MED ORDER — POLYETHYLENE GLYCOL 3350 17 G PO PACK: 17.0000 g | PACK | Freq: Every day | ORAL | Status: AC | PRN

## 2012-06-23 MED ORDER — LORAZEPAM 0.5 MG PO TABS: 0.2500 mg | ORAL_TABLET | Freq: Two times a day (BID) | ORAL | Status: DC | PRN

## 2012-06-23 MED ORDER — MIDAZOLAM HCL 10 MG/2ML IJ SOLN
INTRAMUSCULAR | Status: DC | PRN
  Administered 2012-06-23: 2 mg via INTRAVENOUS
  Administered 2012-06-23: 1 mg via INTRAVENOUS
  Administered 2012-06-23: 2 mg via INTRAVENOUS
  Administered 2012-06-23 (×2): 1 mg via INTRAVENOUS
  Administered 2012-06-23: 2 mg via INTRAVENOUS

## 2012-06-23 MED ORDER — ALBUTEROL SULFATE (5 MG/ML) 0.5% IN NEBU: 2.5000 mg | INHALATION_SOLUTION | Freq: Four times a day (QID) | RESPIRATORY_TRACT | Status: DC | PRN

## 2012-06-23 MED ORDER — MIDAZOLAM HCL 5 MG/ML IJ SOLN
INTRAMUSCULAR | Status: AC
  Filled 2012-06-23: qty 2

## 2012-06-23 MED ORDER — HEPARIN (PORCINE) LOCK FLUSH 10 UNIT/ML IV SOLN
10.0000 [IU] | INTRAVENOUS | Status: DC | PRN
  Filled 2012-06-23: qty 1

## 2012-06-23 MED ORDER — DEXTROSE 5 % IV SOLN: 2.0000 g | INTRAVENOUS | Status: AC

## 2012-06-23 MED ORDER — ACIDOPHILUS PROBIOTIC 100 MG PO CAPS: 1.0000 | ORAL_CAPSULE | Freq: Three times a day (TID) | ORAL | Status: DC

## 2012-06-23 MED ORDER — METOPROLOL TARTRATE 12.5 MG HALF TABLET: 12.5000 mg | ORAL_TABLET | Freq: Two times a day (BID) | ORAL | Status: DC

## 2012-06-23 NOTE — Progress Notes (Signed)
Clinical Social Worker prepared [employer] and submitted dc summary to [facility].  CSW notified pt's granddtr of dc.  CSW staffed case with RN-Nadine who stated that MD requested pt to eat a meal prior to dc'ing.  RN to phone transport (phone number of dc packet). CSW notified [place] of dc plan.   CSW to sign off at time of dc.  Please re consult if needed.   Dala Dock, MSW, [place]

## 2012-06-23 NOTE — Progress Notes (Signed)
Physical Therapy Treatment Patient Details Name: [name] [name] MRN: [id number] DOB: [birth year] Today's Date: 06/23/2012 Time: [id number] PT Time Calculation (min): 16 min  PT Assessment / Plan / Recommendation Comments on Treatment Session  Pt states mobility is more difficult today than last PT session.  Pt unable to increase ambulation distance today due to fatigue & dizziness    Follow Up Recommendations  Skilled nursing facility    Barriers to Discharge        Equipment Recommendations  Defer to next venue    Recommendations for Other Services    Frequency Min 3X/week   Plan      Precautions / Restrictions Precautions Precautions: Fall Restrictions Weight Bearing Restrictions: No       Mobility  Bed Mobility Bed Mobility: Not assessed Transfers Transfers: Sit to Stand;Stand to Sit Sit to Stand: 4: Min assist;With upper extremity assist;With armrests;From chair/3-in-1 Stand to Sit: 4: Min assist;With upper extremity assist;With armrests;To chair/3-in-1 Details for Transfer Assistance: Cues for safe hand placement, use of UE's to control descent, & ensuring seated surface behind her before sitting.  Assist to achieve standing, balance, & controlled descent.   Ambulation/Gait Ambulation/Gait Assistance: 4: Min assist (+1 to follow with recliner.  Pt fatigues quickly) Ambulation Distance (Feet): 30 Feet (15' + 15' ) Assistive device: Rolling walker Ambulation/Gait Assistance Details: Cues for tall posture, pursed lip breathing, stay inside RW., use of UE's on RW to provide increased stability, & to use UE's to allow for increased ease of RLE advancement.  Distance limited due fatigue & mild dizziness per pt.  Dizziness resolved with sitting.    Gait Pattern: Decreased stance time - left;Decreased step length - right;Decreased step length - left;Antalgic;Trunk flexed     PT Goals Acute Rehab PT Goals Time For Goal Achievement: 06/26/12 Potential to Achieve Goals: Good PT  Goal: Sit to Stand - Progress: Progressing toward goal PT Goal: Ambulate - Progress: Not progressing  Visit Information  Last PT Received On: 06/23/12 Assistance Needed: +2 (safety to follow with recliner)    Subjective Data  Subjective: "I just don't feel good today"   Cognition  Overall Cognitive Status: Appears within functional limits for tasks assessed/performed Arousal/Alertness: Awake/alert Orientation Level: Appears intact for tasks assessed Behavior During Session: [facility] for tasks performed    Balance     End of Session PT - End of Session Equipment Utilized During Treatment: Gait belt Activity Tolerance: Patient limited by fatigue;Other (comment) (mild dizziness with ambulation but resolved when sitting) Patient left: in chair;with call bell/phone within reach;with chair alarm set Nurse Communication: Mobility status    [name] [name], [place] 06/23/2012

## 2012-06-23 NOTE — Discharge Summary (Signed)
Physician Discharge Summary  Patient ID: [name] [name] MRN: [id number] DOB/AGE: [birth year] 78 y.o.  Admit date: 06/15/2012 Discharge date: 06/23/2012  Discharge Diagnoses:    *Sepsis associated hypotension - resolved now  Abdominal pain - resolved now  Elevated LFTs - markedly improved now  Nausea and vomiting - resolved now  ARF (acute renal failure)  Hypotension - resolved now  Diabetes mellitus type 2, controlled  Hyperlipidemia  Volume depletion - treated  UTI (lower urinary tract infection)  Lactic acidosis - resolved  HTN (hypertension)  Bacteremia do to gram-negative rods and gram-positive cocci (Klebsiella pneumoniae and Streptococcus gallolyticus)  Iron deficiency anemia, unspecified  Recommendations for provider on follow up appointment:  Please follow up on EGD/Colonoscopy biopsies, recheck CMP, liver enzymes, renal function, cbc  Discharged Condition: good  Hospital Course: From H&P:  78 years old female with known history of diabetes mellitus hypertension and hyperlipidemia presents with complaints of nausea vomiting fever and chills with abdominal pain since morning. Patient states she threw up at least 3 times and denies any blood in the vomitus. The pain is around the epigastric and periumbilical area. Pain is dull aching and persistent. Denies any diarrhea. Denies any chest pain but does have mild shortness of breath. In the ER LFTs are found to be elevated and sonogram does not show anything acute. Patient does have history of cholecystectomy. Patient was found to be febrile afebrile and also found to have possible UTI. Patient's lipase is also mildly elevated. Initially the ER patient was found to be mildly hypotensive for which patient has been started on fluids. Patient was admitted for further management.  Sepsis - resolved now -Continue ceftriaxone to 2 g IV daily through 07/02/12 -Patient remains afebrile and hemodynamically stable  -central line placed 8/12 for IV  abx at skilled nursing facility (SNF)  -last day of antibiotics 07/02/12  -d/c central line at SNF on 8/23   Bacteremia  -Klebsiella pneumoniae and Streptococcus gallolyticus  -repeat blood cultures neg  -Echo negative for vegetation  -EGD and colonoscopy planned on 8/14   Bacteruria  -Escherichia coli in the urine - ceftriaxone IV as above  Left leg pain  -venous u/s neg for DVT   Diabetes mellitus type 2, well controlled  -Hemoglobin A1c 5.6 on August 7  -DC on no meds, glipizide discontinued:  sulfonylureas not safe for a patient of this age and renal function  Anemia  -Hemoglobin has remained stable between 8 and 9.0  - EGD and colonoscopy neg for sig disease - biopsies pending.   Chronic Renal Insufficiency -Given 1 L normal saline and some improvement noted  Deconditioning  - discharge the patient to a skilled nursing facility for continued PT and OT  -I discussed with her granddaughter, her primary caregiver who is agreeable  -She already has a bed offer   Antibiotics:  Zosyn August 7 to August 9  Vancomycin August 7 to August 9  Ceftriaxone August 9>>>TO COMPLETE ON Jul 02, 2012  Consults: GI Dr. Michail Sermon   Significant Diagnostic Studies: EGD/Colonoscopy   Discharge Exam: Pt tolerated EGD/colonoscopy well, no complaints today Blood pressure 141/84, pulse 76, temperature 98.4 F (36.9 C), temperature source Oral, resp. rate 18, height 5\' 10"  (1.778 m), weight 112.9 kg (248 lb 14.4 oz), SpO2 95.00%. General: Pt is alert, follows commands appropriately, not in acute distress  HEENT: No icterus, No thrush, No neck mass, [place]/AT  Cardiovascular: Regular rate and rhythm, S1/S2, , no rubs, no gallops  Respiratory: Clear to  auscultation bilaterally, no wheezing, no crackles, no rhonchi  Abdomen: Soft, non tender, non distended, bowel sounds present, no guarding  Extremities: No edema, pulses DP and PT palpable bilaterally Neuro : nonfocal  Disposition: SNF for  ongoing PT/OT  Discharge Orders    Future Orders Please Complete By Expires   Increase activity slowly      Discharge instructions      Comments:   Check blood sugar 1 time per day and prn when not feeling well Continue IV ceftriaxone 2 gm daily through 8/23 DC central line after last dose of IV ceftriaxone on 8/23     Medication List  As of 06/23/2012  3:18 PM   STOP taking these medications         glipiZIDE 5 MG 24 hr tablet         TAKE these medications         acetaminophen 325 MG tablet   Commonly known as: TYLENOL   Take 2 tablets (650 mg total) by mouth every 6 (six) hours as needed for pain or fever.      ACIDOPHILUS PROBIOTIC 100 MG Caps   Take 1 capsule (100 mg total) by mouth 3 (three) times daily with meals.      albuterol (5 MG/ML) 0.5% nebulizer solution   Commonly known as: PROVENTIL   Take 0.5 mLs (2.5 mg total) by nebulization every 6 (six) hours as needed for wheezing or shortness of breath.      atorvastatin 10 MG tablet   Commonly known as: LIPITOR   Take 10 mg by mouth daily.      dextrose 5 % SOLN 50 mL with cefTRIAXone 2 G SOLR 2 g   Inject 2 g into the vein daily.      diphenhydrAMINE 25 mg capsule   Commonly known as: BENADRYL   Take 1 capsule (25 mg total) by mouth every 6 (six) hours as needed for itching.      DSS 100 MG Caps   Take 100 mg by mouth 2 (two) times daily as needed for constipation.      escitalopram 20 MG tablet   Commonly known as: LEXAPRO   Take 20 mg by mouth daily.      HYDROcodone-acetaminophen 5-500 MG per tablet   Commonly known as: VICODIN   Take 2 tablets by mouth every 4 (four) hours as needed for pain. pain      LORazepam 0.5 MG tablet   Commonly known as: ATIVAN   Take 0.5 tablets (0.25 mg total) by mouth 2 (two) times daily as needed for anxiety. anxiety      metoprolol tartrate 12.5 mg Tabs   Commonly known as: LOPRESSOR   Take 0.5 tablets (12.5 mg total) by mouth 2 (two) times daily.       pantoprazole 40 MG tablet   Commonly known as: PROTONIX   Take 40 mg by mouth daily.      polyethylene glycol packet   Commonly known as: MIRALAX / GLYCOLAX   Take 17 g by mouth daily as needed (constipation).           Follow-up Information    Follow up with with primary care physician. Schedule an appointment as soon as possible for a visit in 1 week. [facility] follow up)    Follow up with Dr. Wilford Corner in 2 weeks to get biopsy results from EGD/Colonoscopy test     I spent 38 mins preparing discharge, reviewing records, labs, images, reports,  writing prescriptions, reconciling meds, etc.  Signed: Irwin Brakeman MD [place], [place] 06/23/2012, 3:18 PM

## 2012-06-23 NOTE — Op Note (Signed)
[facility] [place], [place]  [id number]  COLONOSCOPY PROCEDURE REPORT  PATIENT:  [name] [name], [name] [name]  MR#:  [id number] BIRTHDATE:  1930/09/08, 89 yrs. old  GENDER:  female ENDOSCOPIST:  Wilford Corner, MD REF. BY:   hospital team PROCEDURE DATE:  06/23/2012 PROCEDURE:  Colonoscopy with biopsies ASA CLASS:  Class III INDICATIONS:   anemia, strep bovis bacteremia MEDICATIONS:  Fentanyl 100 mcg IV, Versed 9 mg IV  DESCRIPTION OF PROCEDURE:   After the risks benefits and alternatives of the procedure were thoroughly explained, informed consent was obtained.  The Pentax Ped Colon [id number] and [id number] [id number]) endoscope was introduced through the anus and advanced to the cecum where the ileocecal valve and appendiceal orifice were identified.  In order to reach the cecum repeated loop reduction was necessary and abdominal pressure. Patient also had to be turned in supine position due to excessive looping. The quality of the prep was fair but repeated irrigation resulted in a good and adequate prep.  The instrument was then slowly withdrawn as the colon was fully examined. <<PROCEDUREIMAGES>>  FINDINGS:  Rectal exam unremarkable.  Pediatric colonoscope inserted into the colon and advanced to the cecum, where the appendiceal orifice and ileocecal valve were identified.    On careful withdrawal of the colonoscope a 1 cm multilobulated sessile polyp was seen in the ascending colon  that was biopsied with a cold forceps. Retroflexion revealed small nonthrombosed internal hemorrhoids.  COMPLICATIONS:  None  IMPRESSION: Benign-appearing colon polyp- status post biopsy; Internal hemorrhoids  RECOMMENDATIONS: F/U on path; Proceed with EGD  ______________________________ Wilford Corner, MD  CC:  n. eSIGNEDWilford Corner at 06/23/2012 02:12 PM  Vernard Gambles, [id number]

## 2012-06-23 NOTE — Brief Op Note (Signed)
See endopro notes. Resume solid food. F/U on biopsies as an outpt. Will sign off. No further GI workup needed at this time. F/U prn.

## 2012-06-23 NOTE — Interval H&P Note (Signed)
History and Physical Interval Note:  06/23/2012 1:11 PM  [name] [name]  has presented today for surgery, with the diagnosis of anemia  The various methods of treatment have been discussed with the patient and family. After consideration of risks, benefits and other options for treatment, the patient has consented to  Procedure(s) (LRB): ESOPHAGOGASTRODUODENOSCOPY (EGD) (N/A) COLONOSCOPY (N/A) as a surgical intervention .  The patient's history has been reviewed, patient examined, no change in status, stable for surgery.  I have reviewed the patient's chart and labs.  Questions were answered to the patient's satisfaction.     [name]

## 2012-06-23 NOTE — Progress Notes (Signed)
Clinical Social Worker spoke with Wynonia Sours from [facility] to share that pt will have a central line and will be on antibiotics until 8/23.  CSW to conitnue to follow and assist as needed.  Once pt is medically cleared for dc, CSW to submit appropriate paperwork.     Dala Dock, MSW, [place]

## 2012-06-23 NOTE — H&P (View-Only) (Signed)
[facility] PROGRESS NOTE  [name] [name] [id number] DOB: 1929-11-13 DOA: 06/15/2012 PCP: William Hamburger, MD  Assessment/Plan:  Sepsis  -Continue ceftriaxone to 2 g IV daily  -Patient remains afebrile and hemodynamically stable  -central line placed 8/12 for IV abx at skilled nursing facility (SNF) -last day of antibiotics 07/02/12 -write order to d/c central line at SNF on 8/23 on D/C summary -can possibly be discharged after endoscopy 8/14 if negative Bacteremia  -Klebsiella pneumoniae and Streptococcus gallolyticus  -repeat blood cultures neg -Echo negative for vegetation -EGD and colonoscopy planned on 8/14 Bacteruria  -Escherichia coli in the urine  Left leg pain  -venous u/s neg for DVT Diabetes mellitus type 2, well controlled  -Hemoglobin A1c 5.6 on August 7  -Discontinued Accu-Cheks  Anemia  -Hemoglobin has remained stable between 8 and 9.0  Renal failure  -Give 1 L normal saline  Deconditioning  -Ultimate plan is to discharge the patient to a skilled nursing facility for PT OT  -I discussed with her granddaughter, her primary caregiver who is agreeable  -She already has a bed offer  Antibiotics:  Zosyn August 7 to August 9  Vancomycin August 7 to August 9  Ceftriaxone August 9>>>  Procedures/Studies: Ct Abdomen Pelvis Wo Contrast  06/16/2012  *RADIOLOGY REPORT*  Clinical Data: Nausea, vomiting, chills, and weakness.  Abdominal pain.  Elevated liver function tests and lipase.  CT ABDOMEN AND PELVIS WITHOUT CONTRAST  Technique:  Multidetector CT imaging of the abdomen and pelvis was performed following the standard protocol without intravenous contrast.  Comparison: None.  Findings: Motion artifact limits visualization of the lung bases but there appear to be multiple small nodules present bilaterally, measuring up to about 4 mm diameter.  Nodules are indeterminate. If the patient is at high risk for bronchogenic carcinoma, follow-up chest CT at 1 year is  recommended.  If the patient is at low risk, no follow-up is needed.  This recommendation follows the consensus statement: Guidelines for Management of Small Pulmonary Nodules Detected on CT Scans:  A Statement from the [place] as published in Radiology 2005; 237:395-400.  The unenhanced appearance of the liver, spleen, pancreas, adrenal glands, kidneys, abdominal aorta, and retroperitoneal lymph nodes is unremarkable.  Surgical absence of the gallbladder.  Mild infiltration or edema in the pararenal fat bilaterally and in the subcutaneous fat. No free fluid or free air in the abdomen.  The stomach, small bowel, and colon are not abnormally distended.  Pelvis:  The uterus and adnexal structures are not significantly enlarged.  Calcifications in the uterus suggesting small fibroids. Bladder wall is not thickened.  Small amount of gas in the bladder may represent infection or catheterization.  No free or loculated pelvic fluid collections.  No significant pelvic lymphadenopathy. The appendix is not identified.  Degenerative changes in the lumbar spine.  IMPRESSION: No definite acute process demonstrated on unenhanced imaging of the abdomen and pelvis.  Small indeterminate nodules in the lung bases. Small focus of gas in the bladder which could represent infection or catheterization.  Original Report Authenticated By: Neale Burly, M.D.   Dg Chest 2 View  06/15/2012  *RADIOLOGY REPORT*  Clinical Data: Generalized body aches  CHEST - 2 VIEW  Comparison: None.  Findings: The lung volumes are low.  There is enlargement of central pulmonary vessels probably as a result of low lung volumes. There is no focal pneumonia or pleural effusion.  The aorta is uncoiled.  The heart size is enlarged.  The soft tissue osseous  structures are unremarkable.  IMPRESSION: Low lung volumes.  Cardiomegaly.  No focal pneumonia.  Original Report Authenticated By: Abelardo Diesel, M.D.   US Abdomen Complete  06/16/2012   *RADIOLOGY REPORT*  Clinical Data:  Vomiting and mid epigastric pain.  COMPLETE ABDOMINAL ULTRASOUND  Comparison:  None.  Findings:  Gallbladder:  Surgical absence of the gallbladder.  Common bile duct:  Mild prominence of bile ducts with diameter measured at 9 mm.  This is likely normal in a postoperative patient.  Liver:  Somewhat limited visualization of the liver.  Segmental images are available demonstrating no focal lesion and normal parenchymal echotexture.  IVC:  Appears normal.  Pancreas:  Pancreas is obscured by overlying bowel gas is not well visualized.  Spleen:  Spleen length measures about 11 cm.  Normal parenchymal echotexture.  Right Kidney:  Right kidney measures 11.4 cm length.  No hydronephrosis.  Left Kidney:  Left kidney measures 12 cm length.  No hydronephrosis.  Abdominal aorta:  No aneurysm identified.  IMPRESSION: Somewhat technically limited study due to patient body habitus and bowel gas.  Surgical absence of the gallbladder.  Mild physiologic dilatation of bile ducts.  No acute process suggested.  Original Report Authenticated By: Neale Burly, M.D.   Dg Chest Port 1 View  06/21/2012  *RADIOLOGY REPORT*  Clinical Data: Right IJ catheter placement.  PORTABLE CHEST - 1 VIEW  Comparison: PA and lateral chest 06/15/2012.  Findings: New right IJ catheter is in place with the tip projecting in the lower superior vena cava.  No pneumothorax identified. Heart size is upper normal.  Basilar atelectasis is noted.  IMPRESSION: Right IJ catheter tip projects over the lower superior vena cava. No pneumothorax or acute abnormality.  Original Report Authenticated By: Arvid Right. Luther Parody, M.D.      Code Status: Full Family Communication: Pt at bedside Disposition Plan: Home when medically stable  Subjective: Patient is feeling well today. She denies any fevers, chills, chest pain, abdominal pain, nausea, vomiting, diarrhea.  Objective: Filed Vitals:   06/22/12 0556 06/22/12 0929  06/22/12 1415 06/22/12 1658  BP: 139/69 162/87 121/74 143/73  Pulse: 67 81 84 72  Temp: 98.4 F (36.9 C) 99 F (37.2 C) 98.6 F (37 C) 97.7 F (36.5 C)  TempSrc: Oral Oral Oral Oral  Resp: 20 21 20 20   Height:      Weight:      SpO2: 97% 93% 95% 99%    Intake/Output Summary (Last 24 hours) at 06/22/12 1713 Last data filed at 06/22/12 1300  Gross per 24 hour  Intake    480 ml  Output    600 ml  Net   -120 ml   Weight change: 1.2 kg (2 lb 10.3 oz) Exam:   General:  Pt is alert, follows commands appropriately, not in acute distress  HEENT: No icterus, No thrush, No neck mass, [place]/AT  Cardiovascular: Regular rate and rhythm, S1/S2, , no rubs, no gallops  Respiratory: Clear to auscultation bilaterally, no wheezing, no crackles, no rhonchi  Abdomen: Soft, non tender, non distended, bowel sounds present, no guarding  Extremities: No edema, pulses DP and PT palpable bilaterally  Data Reviewed: Basic Metabolic Panel:  Lab [id number] 0700 06/20/12 0515 06/19/12 0618 06/18/12 0836 06/17/12 0506  NA 139 139 142 143 138  K 3.8 3.8 4.2 4.2 3.9  CL 108 107 112 114* 111  CO2 22 24 24 22  18*  GLUCOSE 80 75 76 97 91  BUN 10 10 10  13  19  CREATININE 1.67* 1.76* 1.90* 1.96* 2.10*  CALCIUM 8.8 8.6 8.5 8.6 8.2*  MG -- -- -- -- --  PHOS -- -- -- -- --   Liver Function Tests:  Lab 06/19/12 0618 06/17/12 0506 06/16/12 1129 06/15/12 2024  AST 28 120* 238* 539*  ALT 85* 162* 212* 253*  ALKPHOS 99 108 119* 187*  BILITOT 0.6 1.7* 2.9* 2.1*  PROT 6.2 6.0 6.4 8.1  ALBUMIN 2.4* 2.5* 2.7* 3.6    Lab 06/17/12 0506 06/15/12 2024  LIPASE 63* 88*  AMYLASE -- --   No results found for this basename: AMMONIA:5 in the last 168 hours CBC:  Lab 06/21/12 0700 06/20/12 0515 06/19/12 0618 06/18/12 0836 06/17/12 0506 06/15/12 2024  WBC 9.0 8.1 9.0 10.9* 13.9* --  NEUTROABS -- 4.7 -- -- -- 6.3  HGB 8.4* 8.5* 8.3* 8.8* 8.6* --  HCT 26.7* 26.0* 25.8* 27.7* 27.1* --  MCV 90.2 91.2 91.8 92.3  90.9 --  PLT 191 172 158 149* 133* --   Cardiac Enzymes:  Lab 06/16/12 0103 06/15/12 2359 06/15/12 2024  CKTOTAL 59 -- --  CKMB 1.1 -- --  CKMBINDEX -- -- --  TROPONINI <0.30 <0.30 <0.30   BNP: No components found with this basename: POCBNP:5 CBG:  Lab 06/22/12 1151 06/22/12 0740 06/21/12 2100 06/21/12 1708 06/21/12 1151  GLUCAP 97 94 99 97 103*    Recent Results (from the past 240 hour(s))  URINE CULTURE     Status: Normal   Collection Time   06/15/12 10:32 PM      Component Value Range Status Comment   Specimen Description URINE, RANDOM   Final    Special Requests NONE   Final    Culture  Setup Time 06/16/2012 02:38   Final    Colony Count >=100,000 COLONIES/ML   Final    Culture ESCHERICHIA COLI   Final    Report Status 06/17/2012 FINAL   Final    Organism ID, Bacteria ESCHERICHIA COLI   Final   CULTURE, BLOOD (ROUTINE X 2)     Status: Normal   Collection Time   06/16/12 12:45 AM      Component Value Range Status Comment   Specimen Description BLOOD LEFT ANTECUBITAL   Final    Special Requests BOTTLES DRAWN AEROBIC AND ANAEROBIC 5CC   Final    Culture  Setup Time 06/16/2012 08:55   Final    Culture     Final    Value: STREPTOCOCCUS SPECIES     Note: Identified as Streptococcus gallolyticus     KLEBSIELLA PNEUMONIAE     Note: Gram Stain Report Called to,Read Back By and Verified With: BRIAN @ 2010 ON 06/16/12 BY GOLLD   Report Status 06/19/2012 FINAL   Final    Organism ID, Bacteria STREPTOCOCCUS SPECIES   Final    Organism ID, Bacteria KLEBSIELLA PNEUMONIAE   Final   CULTURE, BLOOD (ROUTINE X 2)     Status: Normal   Collection Time   06/16/12 12:55 AM      Component Value Range Status Comment   Specimen Description BLOOD LEFT HAND   Final    Special Requests BOTTLES DRAWN AEROBIC AND ANAEROBIC 3CC   Final    Culture  Setup Time 06/16/2012 08:55   Final    Culture     Final    Value: STREPTOCOCCUS SPECIES     Note: Identified as Streptococcus gallolyticus      KLEBSIELLA PNEUMONIAE     Note: SUSCEPTIBILITIES  PERFORMED ON PREVIOUS CULTURE WITHIN THE LAST 5 DAYS.     Note: Gram Stain Report Called to,Read Back By and Verified With: BRIAN @ 2010 ON 06/16/12 BY GOLLD   Report Status 06/19/2012 FINAL   Final   MRSA PCR SCREENING     Status: Normal   Collection Time   06/16/12  6:15 AM      Component Value Range Status Comment   MRSA by PCR NEGATIVE  NEGATIVE Final   CULTURE, BLOOD (ROUTINE X 2)     Status: Normal (Preliminary result)   Collection Time   06/20/12 11:05 AM      Component Value Range Status Comment   Specimen Description BLOOD RIGHT ARM   Final    Special Requests BOTTLES DRAWN AEROBIC AND ANAEROBIC 10CC   Final    Culture  Setup Time 06/20/2012 21:07   Final    Culture     Final    Value:        BLOOD CULTURE RECEIVED NO GROWTH TO DATE CULTURE WILL BE HELD FOR 5 DAYS BEFORE ISSUING A FINAL NEGATIVE REPORT   Report Status PENDING   Incomplete   CULTURE, BLOOD (ROUTINE X 2)     Status: Normal (Preliminary result)   Collection Time   06/20/12 11:25 AM      Component Value Range Status Comment   Specimen Description BLOOD RIGHT HAND   Final    Special Requests BOTTLES DRAWN AEROBIC AND ANAEROBIC 10CC   Final    Culture  Setup Time 06/20/2012 21:08   Final    Culture     Final    Value:        BLOOD CULTURE RECEIVED NO GROWTH TO DATE CULTURE WILL BE HELD FOR 5 DAYS BEFORE ISSUING A FINAL NEGATIVE REPORT   Report Status PENDING   Incomplete      Scheduled Meds:   . antiseptic oral rinse  15 mL Mouth Rinse q12n4p  . atorvastatin  10 mg Oral Daily  . cefTRIAXone (ROCEPHIN)  IV  2 g Intravenous Q24H  . docusate sodium  100 mg Oral BID  . escitalopram  20 mg Oral Daily  . pantoprazole  40 mg Oral Daily  . polyethylene glycol  17 g Oral Daily  . polyethylene glycol-electrolytes  4,000 mL Oral Once  . senna-docusate  1 tablet Oral BID   Continuous Infusions:   . sodium chloride 10 mL/hr at 06/21/12 1521  . sodium chloride    . sodium  chloride 0.9 % 1,000 mL infusion 100 mL/hr at 06/20/12 1225     [name] Lorio, DO  [facility] Pager [phone]  If 7PM-7AM, please contact night-coverage [email or web address] Password [email or web address] 06/22/2012, 5:13 PM   LOS: 7 days

## 2012-06-23 NOTE — Op Note (Signed)
[facility] [place] [place], [place]  [id number]  ENDOSCOPY PROCEDURE REPORT  PATIENT:  [name] [name], [name] [name]  MR#:  [id number] BIRTHDATE:  [birth year], 55 yrs. old  GENDER:  female  ENDOSCOPIST:  Wilford Corner, MD Referred by:   hospital team  PROCEDURE DATE:  06/23/2012 PROCEDURE:  EGD with biopsies ASA CLASS:  Class III INDICATIONS:   anemia, strep bovis bacteremia  MEDICATIONS:  See preceding colonoscopy report  TOPICAL ANESTHETIC: None  DESCRIPTION OF PROCEDURE:   After the risks benefits and alternatives of the procedure were thoroughly explained, informed consent was obtained.  The Pentax Gastroscope [id number] endoscope was introduced through the mouth and advanced to the second portion of the duodenum, without limitations.  The instrument was slowly withdrawn as the mucosa was fully examined. <<PROCEDUREIMAGES>>  FINDINGS:  The endoscope was inserted into the oropharynx and esophagus was intubated.  The gastroesophageal junction was normal in appearance. Endoscope was advanced into the stomach, which revealed a segmental area of nodular mucosa at the antrum it was not ulcerated or erythematous. This area was biopsied and sent for histologic purposes.  The endoscope was advanced to the duodenal bulb and second portion of duodenum which were unremarkable.  The endoscope was withdrawn back into the stomach and retroflexion revealed a normal proximal stomach.  COMPLICATIONS:  None  ENDOSCOPIC IMPRESSION:  Nodular antrum status post biopsies otherwise normal upper endoscopy  RECOMMENDATIONS: Follow up on path  REPEAT EXAM:  N/A  ______________________________ Wilford Corner, MD  CC:  n. eSIGNEDWilford Corner at 06/23/2012 02:18 PM  Vernard Gambles, [id number]

## 2012-06-24 ENCOUNTER — Encounter (HOSPITAL_COMMUNITY): Payer: Self-pay

## 2012-06-24 ENCOUNTER — Encounter (HOSPITAL_COMMUNITY): Payer: Self-pay | Admitting: Gastroenterology

## 2012-06-24 NOTE — Progress Notes (Signed)
   CARE MANAGEMENT NOTE 06/24/2012  Patient:  [name],[name]   Account Number:  [id number]  Date Initiated:  06/16/2012  Documentation initiated by:  Luz Lex  Subjective/Objective Assessment:   SIRS -  Has daughter.     Action/Plan:   Anticipated DC Date:  06/23/2012   Anticipated DC Plan:  [place]  CM consult      [facility] Choice  Resumption Of Svcs/PTA Provider   Choice offered to / List presented to:          [facility] arranged  HH-4 NURSE'S AIDE      Status of service:  Completed, signed off Medicare Important Message given?   (If response is "NO", the following Medicare IM given date fields will be blank) Date Medicare IM given:   Date Additional Medicare IM given:    Discharge Disposition:  [place]  Per UR Regulation:  Reviewed for med. necessity/level of care/duration of stay  If discussed at [place] of Stay Meetings, dates discussed:    Comments:  ContactJanyth Contes Daughter [phone]  06/17/12 [place] MSN CCM Daughter, Percival Spanish, lives with pt and is primary caregiver.  PCP is Dr. Beckie Salts with [facility].  Pt is active with [facility] for RN, aide, PT, and OT.  DSS caseworker is assisting with completion of paperwork for [facility] PCS through [facility].  [place] liaison notified of admission.

## 2012-06-26 LAB — CULTURE, BLOOD (ROUTINE X 2)

## 2012-07-09 ENCOUNTER — Emergency Department (HOSPITAL_COMMUNITY)
Admission: EM | Admit: 2012-07-09 | Discharge: 2012-07-09 | Disposition: A | Discharge status: Home / Self Care | Payer: Medicare Other | Attending: Emergency Medicine | Admitting: Emergency Medicine

## 2012-07-09 ENCOUNTER — Encounter (HOSPITAL_COMMUNITY): Payer: Self-pay | Admitting: Family Medicine

## 2012-07-09 DIAGNOSIS — I10 Essential (primary) hypertension: Secondary | ICD-10-CM | POA: Insufficient documentation

## 2012-07-09 DIAGNOSIS — Z452 Encounter for adjustment and management of vascular access device: Secondary | ICD-10-CM

## 2012-07-09 DIAGNOSIS — E785 Hyperlipidemia, unspecified: Secondary | ICD-10-CM | POA: Insufficient documentation

## 2012-07-09 DIAGNOSIS — E669 Obesity, unspecified: Secondary | ICD-10-CM | POA: Insufficient documentation

## 2012-07-09 DIAGNOSIS — E119 Type 2 diabetes mellitus without complications: Secondary | ICD-10-CM | POA: Insufficient documentation

## 2012-07-09 NOTE — ED Notes (Signed)
Correction: pt has central line, not PICC line

## 2012-07-09 NOTE — ED Notes (Signed)
IV team paged and aware of need to remove line.

## 2012-07-09 NOTE — ED Provider Notes (Signed)
History     CSN: [id number]  Arrival date & time 07/09/12  1716   First MD Initiated Contact with Patient 07/09/12 2007      Chief Complaint  Patient presents with  . IV Medication    PICC line removed   HPI  History provided by the patient and recent medical chart. Patient is an 78 year old female with history of hypertension, hyperlipidemia and diabetes who presents from a skilled nursing facility with requests remove right PICC line. Patient was admitted earlier this month for infection and sepsis. She had a right PICC line placed for IV access and antibiotics. Patient improved significantly and was sent to a skilled nursing facility. Patient has been there for several days receiving antibiotics. Patient has received her last doses of antibiotics and no longer requires PICC line. Nursing staff at the skilled facility was unable to remove the PICC line per their policy. Patient was sent here to have PICC line removed. Patient is not have any other complaints. She denies any shortness of breath, chest pain, recurrent fevers, nausea or vomiting. She denies any pain at the site of the PICC line. There is been no redness or swelling of the skin around the PICC line.    Past Medical History  Diagnosis Date  . Diabetes mellitus   . Hypertension   . Hyperlipidemia     Past Surgical History  Procedure Date  . Cholecystectomy   . Appendectomy   . Esophagogastroduodenoscopy 06/23/2012    Procedure: ESOPHAGOGASTRODUODENOSCOPY (EGD);  Surgeon: Lear Ng, MD;  Location: [facility] ENDOSCOPY;  Service: Endoscopy;  Laterality: N/A;  . Colonoscopy 06/23/2012    Procedure: COLONOSCOPY;  Surgeon: Lear Ng, MD;  [facility]COPY;  Service: Endoscopy;  Laterality: N/A;    History reviewed. No pertinent family history.  History  Substance Use Topics  . Smoking status: Never Smoker   . Smokeless tobacco: Not on file  . Alcohol Use: No    OB History    Grav Para Term Preterm  Abortions TAB SAB Ect Mult Living                  Review of Systems  Constitutional: Negative for fever and chills.  HENT: Negative for neck pain.   Respiratory: Negative for shortness of breath.   Cardiovascular: Negative for chest pain.    Allergies  Review of patient's allergies indicates no known allergies.  Home Medications   Current Outpatient Rx  Name Route Sig Dispense Refill  . ACETAMINOPHEN 325 MG PO TABS Oral Take 2 tablets (650 mg total) by mouth every 6 (six) hours as needed for pain or fever.    . ALBUTEROL SULFATE (5 MG/ML) 0.5% IN NEBU Nebulization Take 0.5 mLs (2.5 mg total) by nebulization every 6 (six) hours as needed for wheezing or shortness of breath. 20 mL 0  . ATORVASTATIN CALCIUM 10 MG PO TABS Oral Take 10 mg by mouth daily.    Marland Kitchen ESCITALOPRAM OXALATE 20 MG PO TABS Oral Take 20 mg by mouth daily.    Marland Kitchen HYDROCODONE-ACETAMINOPHEN 5-500 MG PO TABS Oral Take 2 tablets by mouth every 4 (four) hours as needed for pain. pain 30 tablet 0  . ACIDOPHILUS PROBIOTIC 100 MG PO CAPS Oral Take 1 capsule (100 mg total) by mouth 3 (three) times daily with meals. 90 capsule 0  . LORAZEPAM 0.5 MG PO TABS Oral Take 0.5 tablets (0.25 mg total) by mouth 2 (two) times daily as needed for anxiety. anxiety 30  tablet 0  . METOPROLOL TARTRATE 12.5 MG HALF TABLET Oral Take 0.5 tablets (12.5 mg total) by mouth 2 (two) times daily. 60 tablet 0  . PANTOPRAZOLE SODIUM 40 MG PO TBEC Oral Take 40 mg by mouth daily.      BP 132/76  Pulse 59  Temp 98.3 F (36.8 C) (Oral)  Resp 20  SpO2 98%  Physical Exam  Nursing note and vitals reviewed. Constitutional: She is oriented to person, place, and time. She appears well-developed and well-nourished. No distress.  HENT:  Head: Normocephalic.  Neck:       Right PICC line  Cardiovascular: Normal rate and regular rhythm.   Pulmonary/Chest: Effort normal and breath sounds normal. No respiratory distress. She has no wheezes. She has no rales.    Abdominal: Soft. There is no tenderness. There is no rebound and no guarding.       Obese  Neurological: She is alert and oriented to person, place, and time.  Skin: Skin is warm and dry. No rash noted.  Psychiatric: She has a normal mood and affect. Her behavior is normal.    ED Course  Procedures     1. PICC (peripherally inserted central catheter) removal       MDM  8:10 PM patient seen and evaluated. Patient without any other complaints. IV team has a cyst PICC line and removed. No complications.        Martie Lee, [place] 07/10/12 [phone]

## 2012-07-09 NOTE — ED Notes (Signed)
Pt sts here to have PICC line removed no other complaints

## 2012-07-09 NOTE — ED Notes (Signed)
Pt. States "I need my PICC line removed". This nurse called pt nursing home for clarification. Levada Dy, RN at [facility] reports: pt. Was seen here on August 6th for sepsis and PICC line was placed. Nursing home unable to remove PICC line and PCP will not remove because he did not put it in. Pts antibiotics finished and no longer needs PICC line.

## 2012-07-09 NOTE — ED Notes (Signed)
Wait time advised

## 2012-07-10 NOTE — ED Provider Notes (Signed)
Medical screening examination/treatment/procedure(s) were performed by non-physician practitioner and as supervising physician I was immediately available for consultation/collaboration.  Perlie Mayo, MD 07/10/12 [phone]

## 2012-12-01 ENCOUNTER — Emergency Department (HOSPITAL_COMMUNITY)
Admission: EM | Admit: 2012-12-01 | Discharge: 2012-12-01 | Disposition: A | Discharge status: Home / Self Care | Payer: Medicare Other | Attending: Emergency Medicine | Admitting: Emergency Medicine

## 2012-12-01 ENCOUNTER — Encounter (HOSPITAL_COMMUNITY): Payer: Self-pay | Admitting: *Deleted

## 2012-12-01 DIAGNOSIS — Z79899 Other long term (current) drug therapy: Secondary | ICD-10-CM | POA: Insufficient documentation

## 2012-12-01 DIAGNOSIS — R112 Nausea with vomiting, unspecified: Secondary | ICD-10-CM | POA: Insufficient documentation

## 2012-12-01 DIAGNOSIS — I10 Essential (primary) hypertension: Secondary | ICD-10-CM | POA: Insufficient documentation

## 2012-12-01 DIAGNOSIS — E1169 Type 2 diabetes mellitus with other specified complication: Secondary | ICD-10-CM | POA: Insufficient documentation

## 2012-12-01 DIAGNOSIS — R5383 Other fatigue: Secondary | ICD-10-CM | POA: Insufficient documentation

## 2012-12-01 DIAGNOSIS — R5381 Other malaise: Secondary | ICD-10-CM | POA: Insufficient documentation

## 2012-12-01 DIAGNOSIS — E119 Type 2 diabetes mellitus without complications: Secondary | ICD-10-CM | POA: Insufficient documentation

## 2012-12-01 DIAGNOSIS — E162 Hypoglycemia, unspecified: Secondary | ICD-10-CM

## 2012-12-01 DIAGNOSIS — R197 Diarrhea, unspecified: Secondary | ICD-10-CM | POA: Insufficient documentation

## 2012-12-01 DIAGNOSIS — E785 Hyperlipidemia, unspecified: Secondary | ICD-10-CM | POA: Insufficient documentation

## 2012-12-01 LAB — COMPREHENSIVE METABOLIC PANEL
ALT: 12 U/L (ref 0–35)
CO2: 20 mEq/L (ref 19–32)
Calcium: 9.1 mg/dL (ref 8.4–10.5)
Chloride: 106 mEq/L (ref 96–112)
Creatinine, Ser: 2.05 mg/dL — ABNORMAL HIGH (ref 0.50–1.10)
GFR calc Af Amer: 25 mL/min — ABNORMAL LOW (ref >90)
GFR calc non Af Amer: 22 mL/min — ABNORMAL LOW (ref >90)
Glucose, Bld: 63 mg/dL — ABNORMAL LOW (ref 70–99)
Total Bilirubin: 0.3 mg/dL (ref 0.3–1.2)

## 2012-12-01 LAB — CBC WITH DIFFERENTIAL/PLATELET
Eosinophils Relative: 2 % (ref 0–5)
HCT: 35 % — ABNORMAL LOW (ref 36.0–46.0)
Hemoglobin: 10.9 g/dL — ABNORMAL LOW (ref 12.0–15.0)
Lymphocytes Relative: 28 % (ref 12–46)
Lymphs Abs: 2.3 10*3/uL (ref 0.7–4.0)
MCV: 92.6 fL (ref 78.0–100.0)
Monocytes Relative: 9 % (ref 3–12)
Neutro Abs: 4.8 10*3/uL (ref 1.7–7.7)
RBC: 3.78 MIL/uL — ABNORMAL LOW (ref 3.87–5.11)
WBC: 8.1 10*3/uL (ref 4.0–10.5)

## 2012-12-01 LAB — URINALYSIS, ROUTINE W REFLEX MICROSCOPIC
Bilirubin Urine: NEGATIVE
Leukocytes, UA: NEGATIVE
Nitrite: NEGATIVE
Specific Gravity, Urine: 1.016 (ref 1.005–1.030)
Urobilinogen, UA: 0.2 mg/dL (ref 0.0–1.0)
pH: 5.5 (ref 5.0–8.0)

## 2012-12-01 LAB — GLUCOSE, CAPILLARY: Glucose-Capillary: 69 mg/dL — ABNORMAL LOW (ref 70–99)

## 2012-12-01 NOTE — ED Notes (Signed)
Pt denies any illness, n/v/d. States ate a good meal last night & didn't even take her oral hypoglycemic med yesterday.

## 2012-12-01 NOTE — ED Provider Notes (Signed)
History     CSN: [id number]  Arrival date & time 12/01/12  [id number]   First MD Initiated Contact with Patient 12/01/12 0913      Chief Complaint  Patient presents with  . Hypoglycemia    (Consider location/radiation/quality/duration/timing/severity/associated sxs/prior treatment) HPI The patient presents with concerns of hypoglycemia.  She states that aside from an episode of a GI illness 3 days ago she has been in her usual state of health.  The illness consistent with 2 days of nausea, vomiting, diarrhea, generalized discomfort and fatigue.  Symptoms resolved spontaneously, without clear intervention.  The day after the resolution of his symptoms, she saw her physician.  At that point she had low blood sugar, and had her anti- diabetes drugs have been cut in half. She states that in the interval 2 days she continues to have consistently low blood glucose.  Her only other complaint is generalized weakness.  She denies focal pain, dyspnea, confusion, disorientation, weakness in any extremity. No recent hospitalizations. No recent antibiotics. No other medication changes recently.   Past Medical History  Diagnosis Date  . Diabetes mellitus   . Hypertension   . Hyperlipidemia     Past Surgical History  Procedure Date  . Cholecystectomy   . Appendectomy   . Esophagogastroduodenoscopy 06/23/2012    Procedure: ESOPHAGOGASTRODUODENOSCOPY (EGD);  Surgeon: Lear Ng, MD;  Location: [facility] [place] ENDOSCOPY;  Service: Endoscopy;  Laterality: N/A;  . Colonoscopy 06/23/2012    Procedure: COLONOSCOPY;  Surgeon: Lear Ng, MD;  Location: [facility] ENDOSCOPY;  Service: Endoscopy;  Laterality: N/A;    No family history on file.  History  Substance Use Topics  . Smoking status: Never Smoker   . Smokeless tobacco: Not on file  . Alcohol Use: No    OB History    Grav Para Term Preterm Abortions TAB SAB Ect Mult Living                  Review of Systems  Constitutional:       Per  HPI, otherwise negative  HENT:       Per HPI, otherwise negative  Eyes: Negative.   Respiratory:       Per HPI, otherwise negative  Cardiovascular:       Per HPI, otherwise negative  Gastrointestinal: Positive for nausea, vomiting and diarrhea.  Genitourinary: Negative.   Musculoskeletal:       Per HPI, otherwise negative  Skin: Negative.   Neurological: Negative for syncope.    Allergies  Review of patient's allergies indicates no known allergies.  Home Medications   Current Outpatient Rx  Name  Route  Sig  Dispense  Refill  . ACETAMINOPHEN 325 MG PO TABS   Oral   Take 2 tablets (650 mg total) by mouth every 6 (six) hours as needed for pain or fever.         . ALBUTEROL SULFATE (5 MG/ML) 0.5% IN NEBU   Nebulization   Take 0.5 mLs (2.5 mg total) by nebulization every 6 (six) hours as needed for wheezing or shortness of breath.   20 mL   0   . ATORVASTATIN CALCIUM 10 MG PO TABS   Oral   Take 10 mg by mouth daily.         Marland Kitchen ESCITALOPRAM OXALATE 20 MG PO TABS   Oral   Take 20 mg by mouth daily.         . ACIDOPHILUS PROBIOTIC 100 MG PO CAPS   Oral  Take 1 capsule (100 mg total) by mouth 3 (three) times daily with meals.   90 capsule   0   . LORAZEPAM 0.5 MG PO TABS   Oral   Take 0.5 tablets (0.25 mg total) by mouth 2 (two) times daily as needed for anxiety. anxiety   30 tablet   0   . PANTOPRAZOLE SODIUM 40 MG PO TBEC   Oral   Take 40 mg by mouth daily.           BP 120/78  Pulse 57  Temp 98.6 F (37 C) (Oral)  Resp 18  Ht 5\' 5"  (1.651 m)  Wt 255 lb (115.667 kg)  BMI 42.43 kg/m2  SpO2 99%  Physical Exam  Nursing note and vitals reviewed. Constitutional: She is oriented to person, place, and time. She appears well-developed and well-nourished. No distress.  HENT:  Head: Normocephalic and atraumatic.  Eyes: Conjunctivae normal and EOM are normal.  Cardiovascular: Normal rate and regular rhythm.   Pulmonary/Chest: Effort normal and breath  sounds normal. No stridor. No respiratory distress.  Abdominal: She exhibits no distension.  Musculoskeletal: She exhibits no edema.  Neurological: She is alert and oriented to person, place, and time. No cranial nerve deficit.  Skin: Skin is warm and dry.  Psychiatric: She has a normal mood and affect.    ED Course  Procedures (including critical care time)  Labs Reviewed  GLUCOSE, CAPILLARY - Abnormal; Notable for the following:    Glucose-Capillary 69 (*)     All other components within normal limits  CBC WITH DIFFERENTIAL  COMPREHENSIVE METABOLIC PANEL  URINALYSIS, ROUTINE W REFLEX MICROSCOPIC   No results found.   No diagnosis found.  O2- 100%ra, normal  Following arrival the patient was provided food, and repeat CBG improved.  Glipizide half-life is ~4-5 hours.  12:20 PM Patient is tolerant of PO, repeat glucose is >100.  Labs  / XR do not demonstrate infection.  MDM  This patient with a history of non-insulin-dependent diabetes now presents with concerns of hypoglycemia, generalized discomfort but no focal complaints.  On exam the patient awake, alert, oriented.  Throughout the patient's emergency room stay, she was eating, speaking clearly, in no distress.  Vital signs remained stable, and multiple blood glucose checks were largely reassuring.  The patient was tolerant of by mouth.  There is no overt evidence of ongoing infection.  Given the patient's use of glipizide, she was advised to stop this medication, and follow up with her primary care physician.  She was discharged in stable condition.        Carmin Muskrat, MD 12/01/12 1311

## 2012-12-01 NOTE — ED Notes (Signed)
Grand daughter called to pick pt up

## 2012-12-01 NOTE — ED Notes (Signed)
RN notified CBG 107

## 2012-12-01 NOTE — ED Notes (Signed)
Family reports low blood glucose past 3 days. CBG 40 upon EMS arrival. Given tube of instant glucose & ate a peanut butter sandwich afterwards. CBG 69 upon arrival to ED

## 2013-07-28 ENCOUNTER — Emergency Department (HOSPITAL_COMMUNITY)
Admission: EM | Admit: 2013-07-28 | Discharge: 2013-07-28 | Disposition: A | Discharge status: Home / Self Care | Payer: Medicare Other | Attending: Emergency Medicine | Admitting: Emergency Medicine

## 2013-07-28 ENCOUNTER — Encounter (HOSPITAL_COMMUNITY): Payer: Self-pay | Admitting: *Deleted

## 2013-07-28 DIAGNOSIS — E785 Hyperlipidemia, unspecified: Secondary | ICD-10-CM | POA: Insufficient documentation

## 2013-07-28 DIAGNOSIS — I10 Essential (primary) hypertension: Secondary | ICD-10-CM | POA: Insufficient documentation

## 2013-07-28 DIAGNOSIS — N289 Disorder of kidney and ureter, unspecified: Secondary | ICD-10-CM | POA: Insufficient documentation

## 2013-07-28 DIAGNOSIS — M79604 Pain in right leg: Secondary | ICD-10-CM

## 2013-07-28 DIAGNOSIS — G8929 Other chronic pain: Secondary | ICD-10-CM | POA: Insufficient documentation

## 2013-07-28 DIAGNOSIS — M25519 Pain in unspecified shoulder: Secondary | ICD-10-CM | POA: Insufficient documentation

## 2013-07-28 DIAGNOSIS — Z79899 Other long term (current) drug therapy: Secondary | ICD-10-CM | POA: Insufficient documentation

## 2013-07-28 DIAGNOSIS — M79609 Pain in unspecified limb: Secondary | ICD-10-CM | POA: Insufficient documentation

## 2013-07-28 DIAGNOSIS — E119 Type 2 diabetes mellitus without complications: Secondary | ICD-10-CM | POA: Insufficient documentation

## 2013-07-28 LAB — PRO B NATRIURETIC PEPTIDE: Pro B Natriuretic peptide (BNP): 429.5 pg/mL (ref 0–450)

## 2013-07-28 LAB — COMPREHENSIVE METABOLIC PANEL
ALT: 8 U/L (ref 0–35)
AST: 12 U/L (ref 0–37)
CO2: 25 mEq/L (ref 19–32)
Calcium: 9.1 mg/dL (ref 8.4–10.5)
Potassium: 4.6 mEq/L (ref 3.5–5.1)
Sodium: 139 mEq/L (ref 135–145)
Total Protein: 7.5 g/dL (ref 6.0–8.3)

## 2013-07-28 LAB — CBC WITH DIFFERENTIAL/PLATELET
Basophils Absolute: 0 10*3/uL (ref 0.0–0.1)
Eosinophils Absolute: 0.2 10*3/uL (ref 0.0–0.7)
Eosinophils Relative: 3 % (ref 0–5)
Lymphocytes Relative: 25 % (ref 12–46)
MCV: 94.5 fL (ref 78.0–100.0)
Neutrophils Relative %: 61 % (ref 43–77)
Platelets: 121 10*3/uL — ABNORMAL LOW (ref 150–400)
RDW: 13.1 % (ref 11.5–15.5)
WBC: 6.6 10*3/uL (ref 4.0–10.5)

## 2013-07-28 MED ORDER — SODIUM CHLORIDE 0.9 % IV SOLN: Freq: Once | INTRAVENOUS | Status: DC

## 2013-07-28 MED ORDER — HYDROCODONE-ACETAMINOPHEN 5-325 MG PO TABS: 1.0000 | ORAL_TABLET | Freq: Four times a day (QID) | ORAL | Status: DC | PRN

## 2013-07-28 MED ORDER — SODIUM CHLORIDE 0.9 % IV BOLUS (SEPSIS): 1000.0000 mL | Freq: Once | INTRAVENOUS | Status: DC

## 2013-07-28 MED ORDER — HYDROMORPHONE HCL PF 1 MG/ML IJ SOLN
0.5000 mg | Freq: Once | INTRAMUSCULAR | Status: AC
  Administered 2013-07-28: 0.5 mg via INTRAVENOUS
  Filled 2013-07-28: qty 1

## 2013-07-28 NOTE — ED Notes (Signed)
Charge nurse managed to start PIV in the left shoulder/chest area, unable to draw labs. Hospital phlebotomy paged for blood draw.

## 2013-07-28 NOTE — ED Notes (Signed)
Pt ambulated in room with assistance; pt sts she uses walker at home to ambulate.

## 2013-07-28 NOTE — ED Notes (Signed)
Bed: WA22 Expected date:  Expected time:  Means of arrival:  Comments: ems 

## 2013-07-28 NOTE — ED Notes (Signed)
Attempted to start PIV x , no success, second RN to assess.

## 2013-07-28 NOTE — ED Notes (Signed)
Per EMS pt coming from home with c/o bilateral leg cramps and weakness x 6 days. Per EMS pt reported getting up from recliner last night and was too weak to walk. Pt denies pain.

## 2013-07-28 NOTE — ED Provider Notes (Addendum)
CSN: [id number]     Arrival date & time 07/28/13  1429 History   First MD Initiated Contact with Patient 07/28/13 1500     Chief Complaint  Patient presents with  . leg cramps   (Consider location/radiation/quality/duration/timing/severity/associated sxs/prior Treatment) HPI Patient presents with concerns of new bilateral lower extremity pain. There is no clear precipitant. The past 5 days the patient has had persistent bilateral, symmetric discomfort from the knees inferiorly.  The pain is sharp, crampy.  The pain is severe, and has caused multiple falls. No head trauma, no loss of consciousness, no interval confusion, disorientation, weakness, chest pain, dyspnea. No fever, no chills, no cough. Patient does endorse dyspnea, worse with exertion. No new medications, no new activities. Patient does not smoke, does not drink. She has no history of thromboembolism. No relief with anything. Patient also complains of left shoulder pain, though this is chronic, dating back years, with no notable changes recently, and no new dysesthesia or weakness in the left upper extremity. Past Medical History  Diagnosis Date  . Diabetes mellitus   . Hypertension   . Hyperlipidemia    Past Surgical History  Procedure Laterality Date  . Cholecystectomy    . Appendectomy    . Esophagogastroduodenoscopy  06/23/2012    Procedure: ESOPHAGOGASTRODUODENOSCOPY (EGD);  Surgeon: Lear Ng, MD;  Location: [facility] ENDOSCOPY;  Service: Endoscopy;  Laterality: N/A;  . Colonoscopy  06/23/2012    Procedure: COLONOSCOPY;  Surgeon: Lear Ng, MD;  Location: [facility] ENDOSCOPY;  Service: Endoscopy;  Laterality: N/A;   History reviewed. No pertinent family history. History  Substance Use Topics  . Smoking status: Never Smoker   . Smokeless tobacco: Not on file  . Alcohol Use: No   OB History   Grav Para Term Preterm Abortions TAB SAB Ect Mult Living                 Review of Systems  Constitutional:        Per HPI, otherwise negative  HENT:       Per HPI, otherwise negative  Respiratory:       Per HPI, otherwise negative  Cardiovascular:       Per HPI, otherwise negative  Gastrointestinal: Negative for vomiting.  Endocrine:       Negative aside from HPI  Genitourinary:       Neg aside from HPI   Musculoskeletal:       Per HPI, otherwise negative  Skin: Negative for color change, rash and wound.  Neurological: Negative for syncope.    Allergies  Review of patient's allergies indicates no known allergies.  Home Medications   Current Outpatient Rx  Name  Route  Sig  Dispense  Refill  . EXPIRED: albuterol (PROVENTIL) (5 MG/ML) 0.5% nebulizer solution   Nebulization   Take 0.5 mLs (2.5 mg total) by nebulization every 6 (six) hours as needed for wheezing or shortness of breath.   20 mL   0   . atorvastatin (LIPITOR) 10 MG tablet   Oral   Take 10 mg by mouth daily.         Marland Kitchen escitalopram (LEXAPRO) 20 MG tablet   Oral   Take 20 mg by mouth daily.         Marland Kitchen gabapentin (NEURONTIN) 600 MG tablet   Oral   Take 600 mg by mouth 2 (two) times daily.         Marland Kitchen HYDROcodone-acetaminophen (NORCO) 7.5-325 MG per tablet   Oral  Take 1 tablet by mouth every 8 (eight) hours as needed. For pain         . LORazepam (ATIVAN) 0.5 MG tablet   Oral   Take 0.5 tablets (0.25 mg total) by mouth 2 (two) times daily as needed for anxiety. anxiety   30 tablet   0   . metoprolol succinate (TOPROL-XL) 25 MG 24 hr tablet   Oral   Take 25 mg by mouth 2 (two) times daily.         . pantoprazole (PROTONIX) 40 MG tablet   Oral   Take 40 mg by mouth daily.          BP 125/62  Pulse 53  Temp(Src) 98.3 F (36.8 C) (Oral)  Resp 20  SpO2 96% Physical Exam  Nursing note and vitals reviewed. Constitutional: She is oriented to person, place, and time. She appears well-developed and well-nourished. No distress.  Large, elderly, female resting in bed, seemingly comfortable.   HENT:  Head: Normocephalic and atraumatic.  Eyes: Conjunctivae and EOM are normal.  Cardiovascular: Normal rate and regular rhythm.   Pulmonary/Chest: Effort normal and breath sounds normal. No stridor. No respiratory distress.  Abdominal: She exhibits no distension.  Musculoskeletal: She exhibits no edema.       Right hip: Normal.       Left hip: Normal.  There is symmetric bilateral distal lower extremity edema, with minimal pitting.  Both lower extremities, from the knees down have mild tenderness to palpation with no superficial changes. Patient has diminished flexion of each knee, to 130 deg.  Extension is normal in each knee. Both ankles have appropriate range of motion, appropriate strength. Distal pulses are appreciable and appropriate in each leg. Left shoulder has mild tenderness to palpation anteriorly, but no deformity, no significant loss of range of motion, and no diminished strength.  Neurological: She is alert and oriented to person, place, and time. No cranial nerve deficit.  Skin: Skin is warm and dry.  Psychiatric: She has a normal mood and affect.    ED Course  Procedures (including critical care time) Labs Review Labs Reviewed  CBC WITH DIFFERENTIAL  COMPREHENSIVE METABOLIC PANEL  LIPASE, BLOOD  URINALYSIS, ROUTINE W REFLEX MICROSCOPIC  CK  PRO B NATRIURETIC PEPTIDE   Imaging Review No results found. Cardiac: 50 - sb, abnormal  O2- 99%ra, normal  Update: On repeat exam the patient states that she feels significantly better. We reviewed her labs.  Patient has progressive renal dysfunction, but electrolytes are unremarkable.  6:50 PM Patient ambulatory. MDM  No diagnosis found. Patient presents with concern of bilateral lower extremity pain.  On exam her lower extremities are symmetric, with no superficial changes.  She is no risk factors for DVT.  Patient is neurologically intact, in no distress.  Following provision of analgesics, she was ambulatory.   The patient does have some progression of her chronic renal disease, there are no acute/significant changes from her baseline labs.  She is in no distress, neurologically stable, appropriate for further evaluation and management as an outpatient. She will have her Lasix stopped, pending PMD follow-up    Carmin Muskrat, MD 07/28/13 1851  Carmin Muskrat, MD 07/28/13 [phone]

## 2013-07-28 NOTE — Progress Notes (Signed)
Patient confirms her pcp is Dr. Precious Haws.  System updated.

## 2014-02-09 DIAGNOSIS — E669 Obesity, unspecified: Secondary | ICD-10-CM | POA: Insufficient documentation

## 2014-02-09 DIAGNOSIS — K5900 Constipation, unspecified: Secondary | ICD-10-CM | POA: Insufficient documentation

## 2014-12-26 ENCOUNTER — Encounter (HOSPITAL_COMMUNITY): Payer: Self-pay | Admitting: Emergency Medicine

## 2014-12-26 ENCOUNTER — Inpatient Hospital Stay (HOSPITAL_COMMUNITY)
Admission: EM | Admit: 2014-12-26 | Discharge: 2015-01-02 | DRG: 641 | Disposition: A | Discharge status: Skilled Nursing Facility | Payer: Medicare Other | Attending: Internal Medicine | Admitting: Internal Medicine

## 2014-12-26 DIAGNOSIS — R634 Abnormal weight loss: Secondary | ICD-10-CM | POA: Diagnosis present

## 2014-12-26 DIAGNOSIS — D649 Anemia, unspecified: Secondary | ICD-10-CM | POA: Diagnosis present

## 2014-12-26 DIAGNOSIS — N179 Acute kidney failure, unspecified: Secondary | ICD-10-CM

## 2014-12-26 DIAGNOSIS — I10 Essential (primary) hypertension: Secondary | ICD-10-CM | POA: Diagnosis present

## 2014-12-26 DIAGNOSIS — E785 Hyperlipidemia, unspecified: Secondary | ICD-10-CM | POA: Diagnosis present

## 2014-12-26 DIAGNOSIS — T7601XA Adult neglect or abandonment, suspected, initial encounter: Secondary | ICD-10-CM

## 2014-12-26 DIAGNOSIS — D696 Thrombocytopenia, unspecified: Secondary | ICD-10-CM | POA: Diagnosis present

## 2014-12-26 DIAGNOSIS — Z6835 Body mass index (BMI) 35.0-35.9, adult: Secondary | ICD-10-CM

## 2014-12-26 DIAGNOSIS — R001 Bradycardia, unspecified: Secondary | ICD-10-CM | POA: Diagnosis present

## 2014-12-26 DIAGNOSIS — N184 Chronic kidney disease, stage 4 (severe): Secondary | ICD-10-CM | POA: Diagnosis present

## 2014-12-26 DIAGNOSIS — R1032 Left lower quadrant pain: Secondary | ICD-10-CM

## 2014-12-26 DIAGNOSIS — I129 Hypertensive chronic kidney disease with stage 1 through stage 4 chronic kidney disease, or unspecified chronic kidney disease: Secondary | ICD-10-CM | POA: Diagnosis present

## 2014-12-26 DIAGNOSIS — R45850 Homicidal ideations: Secondary | ICD-10-CM | POA: Diagnosis present

## 2014-12-26 DIAGNOSIS — E869 Volume depletion, unspecified: Secondary | ICD-10-CM | POA: Diagnosis not present

## 2014-12-26 DIAGNOSIS — F329 Major depressive disorder, single episode, unspecified: Secondary | ICD-10-CM | POA: Diagnosis present

## 2014-12-26 DIAGNOSIS — R7881 Bacteremia: Secondary | ICD-10-CM

## 2014-12-26 DIAGNOSIS — E1122 Type 2 diabetes mellitus with diabetic chronic kidney disease: Secondary | ICD-10-CM | POA: Diagnosis present

## 2014-12-26 DIAGNOSIS — R45851 Suicidal ideations: Secondary | ICD-10-CM | POA: Diagnosis present

## 2014-12-26 DIAGNOSIS — E875 Hyperkalemia: Secondary | ICD-10-CM | POA: Diagnosis present

## 2014-12-26 DIAGNOSIS — R1030 Lower abdominal pain, unspecified: Secondary | ICD-10-CM

## 2014-12-26 DIAGNOSIS — D509 Iron deficiency anemia, unspecified: Secondary | ICD-10-CM | POA: Diagnosis present

## 2014-12-26 DIAGNOSIS — N189 Chronic kidney disease, unspecified: Secondary | ICD-10-CM

## 2014-12-26 DIAGNOSIS — I959 Hypotension, unspecified: Secondary | ICD-10-CM | POA: Diagnosis present

## 2014-12-26 DIAGNOSIS — E119 Type 2 diabetes mellitus without complications: Secondary | ICD-10-CM | POA: Diagnosis present

## 2014-12-26 DIAGNOSIS — F322 Major depressive disorder, single episode, severe without psychotic features: Secondary | ICD-10-CM | POA: Diagnosis present

## 2014-12-26 LAB — CBC WITH DIFFERENTIAL/PLATELET
BASOS ABS: 0 10*3/uL (ref 0.0–0.1)
Basophils Relative: 1 % (ref 0–1)
EOS PCT: 2 % (ref 0–5)
Eosinophils Absolute: 0.2 10*3/uL (ref 0.0–0.7)
HEMATOCRIT: 40.8 % (ref 36.0–46.0)
Hemoglobin: 13.1 g/dL (ref 12.0–15.0)
Lymphocytes Relative: 28 % (ref 12–46)
Lymphs Abs: 2.5 10*3/uL (ref 0.7–4.0)
MCH: 30 pg (ref 26.0–34.0)
MCHC: 32.1 g/dL (ref 30.0–36.0)
MCV: 93.6 fL (ref 78.0–100.0)
MONO ABS: 0.9 10*3/uL (ref 0.1–1.0)
MONOS PCT: 11 % (ref 3–12)
NEUTROS ABS: 5.2 10*3/uL (ref 1.7–7.7)
NEUTROS PCT: 59 % (ref 43–77)
Platelets: 134 10*3/uL — ABNORMAL LOW (ref 150–400)
RBC: 4.36 MIL/uL (ref 3.87–5.11)
RDW: 13.6 % (ref 11.5–15.5)
WBC: 8.8 10*3/uL (ref 4.0–10.5)

## 2014-12-26 LAB — COMPREHENSIVE METABOLIC PANEL
ALT: 15 U/L (ref 0–35)
AST: 20 U/L (ref 0–37)
Albumin: 3.7 g/dL (ref 3.5–5.2)
Alkaline Phosphatase: 92 U/L (ref 39–117)
Anion gap: 9 (ref 5–15)
BUN: 36 mg/dL — ABNORMAL HIGH (ref 6–23)
CALCIUM: 9.4 mg/dL (ref 8.4–10.5)
CO2: 23 mmol/L (ref 19–32)
Chloride: 109 mmol/L (ref 96–112)
Creatinine, Ser: 2.44 mg/dL — ABNORMAL HIGH (ref 0.50–1.10)
GFR calc Af Amer: 20 mL/min — ABNORMAL LOW (ref >90)
GFR calc non Af Amer: 17 mL/min — ABNORMAL LOW (ref >90)
Glucose, Bld: 147 mg/dL — ABNORMAL HIGH (ref 70–99)
POTASSIUM: 5 mmol/L (ref 3.5–5.1)
Sodium: 141 mmol/L (ref 135–145)
TOTAL PROTEIN: 8.1 g/dL (ref 6.0–8.3)
Total Bilirubin: 0.8 mg/dL (ref 0.3–1.2)

## 2014-12-26 LAB — URINALYSIS, ROUTINE W REFLEX MICROSCOPIC
BILIRUBIN URINE: NEGATIVE
Glucose, UA: NEGATIVE mg/dL
Hgb urine dipstick: NEGATIVE
Ketones, ur: NEGATIVE mg/dL
Leukocytes, UA: NEGATIVE
Nitrite: NEGATIVE
PROTEIN: NEGATIVE mg/dL
SPECIFIC GRAVITY, URINE: 1.017 (ref 1.005–1.030)
UROBILINOGEN UA: 1 mg/dL (ref 0.0–1.0)
pH: 6 (ref 5.0–8.0)

## 2014-12-26 LAB — RAPID URINE DRUG SCREEN, HOSP PERFORMED
Amphetamines: NOT DETECTED
BENZODIAZEPINES: NOT DETECTED
Barbiturates: NOT DETECTED
Cocaine: NOT DETECTED
Opiates: POSITIVE — AB
Tetrahydrocannabinol: NOT DETECTED

## 2014-12-26 LAB — CBG MONITORING, ED: Glucose-Capillary: 125 mg/dL — ABNORMAL HIGH (ref 70–99)

## 2014-12-26 LAB — LIPASE, BLOOD: Lipase: 45 U/L (ref 11–59)

## 2014-12-26 LAB — ETHANOL: Alcohol, Ethyl (B): <5 mg/dL (ref 0–9)

## 2014-12-26 MED ORDER — SODIUM CHLORIDE 0.9 % IV BOLUS (SEPSIS)
1000.0000 mL | Freq: Once | INTRAVENOUS | Status: AC
  Administered 2014-12-26: 1000 mL via INTRAVENOUS

## 2014-12-26 MED ORDER — IOHEXOL 300 MG/ML  SOLN
50.0000 mL | Freq: Once | INTRAMUSCULAR | Status: AC | PRN
  Administered 2014-12-27: 50 mL via ORAL

## 2014-12-26 MED ORDER — MORPHINE SULFATE 4 MG/ML IJ SOLN
4.0000 mg | Freq: Once | INTRAMUSCULAR | Status: AC
  Administered 2014-12-26: 4 mg via INTRAVENOUS
  Filled 2014-12-26: qty 1

## 2014-12-26 NOTE — ED Notes (Signed)
CBG 125 °

## 2014-12-26 NOTE — ED Notes (Signed)
Cleo (granddaughter) [phone] please call with updates.
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
## 2014-12-27 ENCOUNTER — Encounter (HOSPITAL_COMMUNITY): Payer: Self-pay | Admitting: Radiology

## 2014-12-27 ENCOUNTER — Emergency Department (HOSPITAL_COMMUNITY): Payer: Medicare Other

## 2014-12-27 DIAGNOSIS — R45851 Suicidal ideations: Secondary | ICD-10-CM

## 2014-12-27 DIAGNOSIS — T7601XA Adult neglect or abandonment, suspected, initial encounter: Secondary | ICD-10-CM

## 2014-12-27 DIAGNOSIS — F322 Major depressive disorder, single episode, severe without psychotic features: Secondary | ICD-10-CM | POA: Diagnosis present

## 2014-12-27 LAB — CBG MONITORING, ED: GLUCOSE-CAPILLARY: 158 mg/dL — AB (ref 70–99)

## 2014-12-27 MED ORDER — PANTOPRAZOLE SODIUM 40 MG PO TBEC
40.0000 mg | DELAYED_RELEASE_TABLET | Freq: Every day | ORAL | Status: DC
  Administered 2014-12-27 – 2015-01-01 (×6): 40 mg via ORAL
  Filled 2014-12-27 (×7): qty 1

## 2014-12-27 MED ORDER — METOPROLOL SUCCINATE ER 25 MG PO TB24
25.0000 mg | ORAL_TABLET | Freq: Two times a day (BID) | ORAL | Status: DC
  Administered 2014-12-27 (×2): 25 mg via ORAL
  Filled 2014-12-27 (×4): qty 1

## 2014-12-27 MED ORDER — ATORVASTATIN CALCIUM 10 MG PO TABS
10.0000 mg | ORAL_TABLET | Freq: Every day | ORAL | Status: DC
  Administered 2014-12-27 – 2015-01-01 (×6): 10 mg via ORAL
  Filled 2014-12-27 (×8): qty 1

## 2014-12-27 MED ORDER — ALPRAZOLAM 0.5 MG PO TABS
1.0000 mg | ORAL_TABLET | Freq: Two times a day (BID) | ORAL | Status: DC | PRN
  Administered 2014-12-27 – 2015-01-01 (×5): 1 mg via ORAL
  Filled 2014-12-27 (×5): qty 2

## 2014-12-27 MED ORDER — ESCITALOPRAM OXALATE 20 MG PO TABS
20.0000 mg | ORAL_TABLET | Freq: Every day | ORAL | Status: DC
  Administered 2014-12-27 – 2015-01-01 (×6): 20 mg via ORAL
  Filled 2014-12-27: qty 2
  Filled 2014-12-27: qty 1
  Filled 2014-12-27: qty 2
  Filled 2014-12-27: qty 1
  Filled 2014-12-27: qty 2
  Filled 2014-12-27 (×3): qty 1

## 2014-12-27 MED ORDER — TRAZODONE HCL 50 MG PO TABS
50.0000 mg | ORAL_TABLET | Freq: Every day | ORAL | Status: DC
  Administered 2014-12-27 – 2015-01-01 (×6): 50 mg via ORAL
  Filled 2014-12-27 (×8): qty 1

## 2014-12-27 MED ORDER — LOSARTAN POTASSIUM 50 MG PO TABS
50.0000 mg | ORAL_TABLET | Freq: Every day | ORAL | Status: DC
  Administered 2014-12-27: 50 mg via ORAL
  Filled 2014-12-27 (×3): qty 1

## 2014-12-27 MED ORDER — GABAPENTIN 300 MG PO CAPS
600.0000 mg | ORAL_CAPSULE | Freq: Two times a day (BID) | ORAL | Status: DC
  Administered 2014-12-27 – 2014-12-29 (×6): 600 mg via ORAL
  Filled 2014-12-27 (×7): qty 2

## 2014-12-27 NOTE — Progress Notes (Signed)
CSW faxed Pt to the following facilities:  [facility]  Will continue to seek placement.   Peri Maris, [place] 12/27/2014 3:27 PM
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
## 2014-12-27 NOTE — ED Notes (Signed)
Called staffing for a Engineer, water. Charge nurse made aware.

## 2014-12-27 NOTE — ED Notes (Signed)
PT TALKING TO CONRAD NP FOR HER PSYCH EVAL.

## 2014-12-27 NOTE — ED Notes (Signed)
Sitter has arrived.

## 2014-12-27 NOTE — BH Assessment (Addendum)
Reviewed EDP note prior to initiating assessment. Assessment was delayed due to multiple TTS consults called in at the same time, and only one functioning TA machine at [facility] ED.   Assessment to commence shortly.    Lear Ng, [facility] Triage Specialist 12/27/2014 1:45 AM

## 2014-12-27 NOTE — ED Notes (Signed)
Ct notified pt finished contrast

## 2014-12-27 NOTE — BHH Counselor (Signed)
TTS Counselor spoke with pt's attending RN, Micah, and asked if tele-assessment cart could be placed in pt's room. Counselor reviewed pt's chart and MD's note in preparation for behavioral health assessment.  Assessment to begin shortly.   Ramond Dial, [facility] Triage Specialist

## 2014-12-27 NOTE — ED Notes (Signed)
Ambulated pt. Pt BP 115/57. PA made aware.

## 2014-12-27 NOTE — ED Notes (Signed)
Pt denies SI/HI at this time. Pt states she needs to get better care.

## 2014-12-27 NOTE — ED Notes (Signed)
Pt's son called to speak with pt. Pt given RN phone.

## 2014-12-27 NOTE — ED Provider Notes (Signed)
CT scan is negative.  Pt is medically cleared.  Pt has been assessed by TTS.  Will have social work eval as pt may not end up needing inpatient psychiatric admission if her social issues can be addressed or further evaluation.  Dorie Rank, MD 12/27/14 [phone]

## 2014-12-28 ENCOUNTER — Encounter (HOSPITAL_COMMUNITY): Payer: Self-pay | Admitting: *Deleted

## 2014-12-28 ENCOUNTER — Inpatient Hospital Stay (HOSPITAL_COMMUNITY): Payer: Medicare Other

## 2014-12-28 ENCOUNTER — Emergency Department (HOSPITAL_COMMUNITY): Payer: Medicare Other

## 2014-12-28 DIAGNOSIS — D696 Thrombocytopenia, unspecified: Secondary | ICD-10-CM | POA: Diagnosis present

## 2014-12-28 DIAGNOSIS — E119 Type 2 diabetes mellitus without complications: Secondary | ICD-10-CM

## 2014-12-28 DIAGNOSIS — T7601XA Adult neglect or abandonment, suspected, initial encounter: Secondary | ICD-10-CM | POA: Diagnosis present

## 2014-12-28 DIAGNOSIS — I10 Essential (primary) hypertension: Secondary | ICD-10-CM | POA: Diagnosis not present

## 2014-12-28 DIAGNOSIS — R609 Edema, unspecified: Secondary | ICD-10-CM | POA: Diagnosis not present

## 2014-12-28 DIAGNOSIS — E869 Volume depletion, unspecified: Secondary | ICD-10-CM | POA: Diagnosis present

## 2014-12-28 DIAGNOSIS — F322 Major depressive disorder, single episode, severe without psychotic features: Secondary | ICD-10-CM | POA: Diagnosis not present

## 2014-12-28 DIAGNOSIS — D509 Iron deficiency anemia, unspecified: Secondary | ICD-10-CM | POA: Diagnosis present

## 2014-12-28 DIAGNOSIS — I129 Hypertensive chronic kidney disease with stage 1 through stage 4 chronic kidney disease, or unspecified chronic kidney disease: Secondary | ICD-10-CM | POA: Diagnosis present

## 2014-12-28 DIAGNOSIS — E875 Hyperkalemia: Secondary | ICD-10-CM | POA: Diagnosis present

## 2014-12-28 DIAGNOSIS — R45851 Suicidal ideations: Secondary | ICD-10-CM | POA: Diagnosis present

## 2014-12-28 DIAGNOSIS — N179 Acute kidney failure, unspecified: Secondary | ICD-10-CM | POA: Diagnosis not present

## 2014-12-28 DIAGNOSIS — Z6835 Body mass index (BMI) 35.0-35.9, adult: Secondary | ICD-10-CM | POA: Diagnosis not present

## 2014-12-28 DIAGNOSIS — N184 Chronic kidney disease, stage 4 (severe): Secondary | ICD-10-CM | POA: Diagnosis present

## 2014-12-28 DIAGNOSIS — N189 Chronic kidney disease, unspecified: Secondary | ICD-10-CM | POA: Diagnosis not present

## 2014-12-28 DIAGNOSIS — I952 Hypotension due to drugs: Secondary | ICD-10-CM | POA: Diagnosis not present

## 2014-12-28 DIAGNOSIS — E785 Hyperlipidemia, unspecified: Secondary | ICD-10-CM | POA: Diagnosis present

## 2014-12-28 DIAGNOSIS — R634 Abnormal weight loss: Secondary | ICD-10-CM | POA: Diagnosis present

## 2014-12-28 DIAGNOSIS — R001 Bradycardia, unspecified: Secondary | ICD-10-CM | POA: Diagnosis present

## 2014-12-28 DIAGNOSIS — E1122 Type 2 diabetes mellitus with diabetic chronic kidney disease: Secondary | ICD-10-CM | POA: Diagnosis present

## 2014-12-28 DIAGNOSIS — F329 Major depressive disorder, single episode, unspecified: Secondary | ICD-10-CM | POA: Diagnosis present

## 2014-12-28 DIAGNOSIS — R45850 Homicidal ideations: Secondary | ICD-10-CM | POA: Diagnosis present

## 2014-12-28 LAB — BASIC METABOLIC PANEL
Anion gap: 4 — ABNORMAL LOW (ref 5–15)
Anion gap: 8 (ref 5–15)
BUN: 46 mg/dL — AB (ref 6–23)
BUN: 47 mg/dL — ABNORMAL HIGH (ref 6–23)
CALCIUM: 8.4 mg/dL (ref 8.4–10.5)
CHLORIDE: 107 mmol/L (ref 96–112)
CO2: 26 mmol/L (ref 19–32)
CO2: 22 mmol/L (ref 19–32)
Calcium: 8.5 mg/dL (ref 8.4–10.5)
Chloride: 106 mmol/L (ref 96–112)
Creatinine, Ser: 3.2 mg/dL — ABNORMAL HIGH (ref 0.50–1.10)
Creatinine, Ser: 3.23 mg/dL — ABNORMAL HIGH (ref 0.50–1.10)
GFR calc Af Amer: 14 mL/min — ABNORMAL LOW (ref >90)
GFR calc Af Amer: 14 mL/min — ABNORMAL LOW (ref >90)
GFR calc non Af Amer: 13 mL/min — ABNORMAL LOW (ref >90)
GFR calc non Af Amer: 12 mL/min — ABNORMAL LOW (ref >90)
GLUCOSE: 137 mg/dL — AB (ref 70–99)
Glucose, Bld: 120 mg/dL — ABNORMAL HIGH (ref 70–99)
Potassium: 5.2 mmol/L — ABNORMAL HIGH (ref 3.5–5.1)
Potassium: 5.1 mmol/L (ref 3.5–5.1)
SODIUM: 137 mmol/L (ref 135–145)
Sodium: 136 mmol/L (ref 135–145)

## 2014-12-28 LAB — VITAMIN B12: VITAMIN B 12: 389 pg/mL (ref 211–911)

## 2014-12-28 LAB — CBC
HCT: 33.4 % — ABNORMAL LOW (ref 36.0–46.0)
HEMATOCRIT: 34 % — AB (ref 36.0–46.0)
HEMOGLOBIN: 10.3 g/dL — AB (ref 12.0–15.0)
HEMOGLOBIN: 10.8 g/dL — AB (ref 12.0–15.0)
MCH: 28.9 pg (ref 26.0–34.0)
MCH: 29.8 pg (ref 26.0–34.0)
MCHC: 30.8 g/dL (ref 30.0–36.0)
MCHC: 31.8 g/dL (ref 30.0–36.0)
MCV: 93.8 fL (ref 78.0–100.0)
MCV: 93.9 fL (ref 78.0–100.0)
Platelets: 128 10*3/uL — ABNORMAL LOW (ref 150–400)
Platelets: 126 10*3/uL — ABNORMAL LOW (ref 150–400)
RBC: 3.56 MIL/uL — AB (ref 3.87–5.11)
RBC: 3.62 MIL/uL — ABNORMAL LOW (ref 3.87–5.11)
RDW: 13.8 % (ref 11.5–15.5)
RDW: 13.8 % (ref 11.5–15.5)
WBC: 6.6 10*3/uL (ref 4.0–10.5)
WBC: 7.2 10*3/uL (ref 4.0–10.5)

## 2014-12-28 LAB — LACTATE DEHYDROGENASE: LDH: 176 U/L (ref 94–250)

## 2014-12-28 LAB — FERRITIN: Ferritin: 205 ng/mL (ref 10–291)

## 2014-12-28 LAB — CREATININE, SERUM
CREATININE: 3.31 mg/dL — AB (ref 0.50–1.10)
GFR calc Af Amer: 14 mL/min — ABNORMAL LOW (ref >90)
GFR, EST NON AFRICAN AMERICAN: 12 mL/min — AB (ref >90)

## 2014-12-28 LAB — CBG MONITORING, ED: GLUCOSE-CAPILLARY: 324 mg/dL — AB (ref 70–99)

## 2014-12-28 LAB — RETICULOCYTES
RBC.: 3.6 MIL/uL — ABNORMAL LOW (ref 3.87–5.11)
RETIC COUNT ABSOLUTE: 61.2 10*3/uL (ref 19.0–186.0)
Retic Ct Pct: 1.7 % (ref 0.4–3.1)

## 2014-12-28 LAB — IRON AND TIBC
Iron: 43 ug/dL (ref 42–145)
Saturation Ratios: 18 % — ABNORMAL LOW (ref 20–55)
TIBC: 245 ug/dL — AB (ref 250–470)
UIBC: 202 ug/dL (ref 125–400)

## 2014-12-28 LAB — GLUCOSE, CAPILLARY: Glucose-Capillary: 177 mg/dL — ABNORMAL HIGH (ref 70–99)

## 2014-12-28 LAB — I-STAT CG4 LACTIC ACID, ED: Lactic Acid, Venous: 1.29 mmol/L (ref 0.5–2.0)

## 2014-12-28 LAB — TSH: TSH: 1.992 u[IU]/mL (ref 0.350–4.500)

## 2014-12-28 LAB — FOLATE: FOLATE: 17.6 ng/mL

## 2014-12-28 MED ORDER — SODIUM CHLORIDE 0.9 % IV SOLN
INTRAVENOUS | Status: DC
  Administered 2014-12-28 – 2014-12-30 (×5): via INTRAVENOUS

## 2014-12-28 MED ORDER — ONDANSETRON HCL 4 MG/2ML IJ SOLN: 4.0000 mg | Freq: Four times a day (QID) | INTRAMUSCULAR | Status: DC | PRN

## 2014-12-28 MED ORDER — POLYETHYLENE GLYCOL 3350 17 G PO PACK
17.0000 g | PACK | Freq: Every day | ORAL | Status: DC | PRN
  Filled 2014-12-28: qty 1

## 2014-12-28 MED ORDER — FE FUMARATE-B12-VIT C-FA-IFC PO CAPS
1.0000 | ORAL_CAPSULE | Freq: Three times a day (TID) | ORAL | Status: DC
  Administered 2014-12-29 – 2015-01-02 (×13): 1 via ORAL
  Filled 2014-12-28 (×16): qty 1

## 2014-12-28 MED ORDER — ALUM & MAG HYDROXIDE-SIMETH 200-200-20 MG/5ML PO SUSP: 30.0000 mL | Freq: Four times a day (QID) | ORAL | Status: DC | PRN

## 2014-12-28 MED ORDER — INSULIN ASPART 100 UNIT/ML ~~LOC~~ SOLN: 0.0000 [IU] | Freq: Every day | SUBCUTANEOUS | Status: DC

## 2014-12-28 MED ORDER — SODIUM CHLORIDE 0.9 % IJ SOLN
3.0000 mL | Freq: Two times a day (BID) | INTRAMUSCULAR | Status: DC
  Administered 2014-12-31 – 2015-01-01 (×3): 3 mL via INTRAVENOUS

## 2014-12-28 MED ORDER — ACETAMINOPHEN 650 MG RE SUPP: 650.0000 mg | Freq: Four times a day (QID) | RECTAL | Status: DC | PRN

## 2014-12-28 MED ORDER — SODIUM CHLORIDE 0.9 % IV SOLN
INTRAVENOUS | Status: DC
  Administered 2014-12-31 (×2): via INTRAVENOUS

## 2014-12-28 MED ORDER — ACETAMINOPHEN 325 MG PO TABS
650.0000 mg | ORAL_TABLET | Freq: Four times a day (QID) | ORAL | Status: DC | PRN
  Administered 2014-12-29: 650 mg via ORAL
  Filled 2014-12-28: qty 2

## 2014-12-28 MED ORDER — ONDANSETRON HCL 4 MG PO TABS: 4.0000 mg | ORAL_TABLET | Freq: Four times a day (QID) | ORAL | Status: DC | PRN

## 2014-12-28 MED ORDER — INSULIN ASPART 100 UNIT/ML ~~LOC~~ SOLN
0.0000 [IU] | Freq: Three times a day (TID) | SUBCUTANEOUS | Status: DC
  Administered 2014-12-28: 7 [IU] via SUBCUTANEOUS
  Administered 2014-12-29 – 2015-01-01 (×7): 1 [IU] via SUBCUTANEOUS
  Filled 2014-12-28: qty 1

## 2014-12-28 MED ORDER — INTEGRA F 125-1 MG PO CAPS: 1.0000 | ORAL_CAPSULE | Freq: Every day | ORAL | Status: DC

## 2014-12-28 MED ORDER — HEPARIN SODIUM (PORCINE) 5000 UNIT/ML IJ SOLN
5000.0000 [IU] | Freq: Three times a day (TID) | INTRAMUSCULAR | Status: DC
Start: 2014-12-28 — End: 2015-01-02
  Administered 2014-12-28 – 2015-01-02 (×15): 5000 [IU] via SUBCUTANEOUS
  Filled 2014-12-28 (×17): qty 1

## 2014-12-28 MED ORDER — DOCUSATE SODIUM 100 MG PO CAPS
100.0000 mg | ORAL_CAPSULE | Freq: Two times a day (BID) | ORAL | Status: DC
  Administered 2014-12-28 – 2015-01-01 (×9): 100 mg via ORAL
  Filled 2014-12-28 (×12): qty 1

## 2014-12-28 NOTE — Progress Notes (Signed)
New Admission Note:   Arrival Method: via bed from ED Mental Orientation: Alert and Oriented x4 Telemetry: Placed on box 6E26 per MD order Assessment: Completed Skin: Intact, warm, and dry IV: Right AC with Normal Saline @100cc /hr per MD order Pain: N/A Tubes: N/A Safety Measures: Educated on fall prevention safety plan, patient acknowledged and understood. Admission: Completed [address] [place] Orientation: Patient has been orientated to the room, unit and staff.  Family: N/A  Orders have been reviewed and implemented. Will continue to monitor the patient. Room reassessed. Suicide Sitter at bedside.    Dorothea Glassman, RN  Phone number: [phone]

## 2014-12-28 NOTE — ED Notes (Signed)
SPOKE WITH DR Wilson Singer ABOUT PT LABS. STATES HE WILL REFER HER MEDICINE FOR POSSIBLE ADMISSION

## 2014-12-28 NOTE — ED Notes (Signed)
Spoke to bh again about possibly getting pt psychiatrically cleared before her medical admit. She has adimatly denied being si.

## 2014-12-28 NOTE — ED Notes (Signed)
Secretary successfully faxed x-ray and ekg to [facility] as requested. Phlebotomy currently at the bedside.

## 2014-12-28 NOTE — ED Notes (Signed)
Patient up to the restroom with assistive device, with sitter assist.
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
## 2014-12-28 NOTE — ED Notes (Signed)
Received phone call from [place] in regards to patient's review for [facility].  She states an updated EKG, and chest x-ray will be needed, and faxed to [phone], with ATTN: Estill Bamberg.

## 2014-12-28 NOTE — ED Provider Notes (Signed)
Worsening renal function noted. Minimal hyperkalemia. BP soft. Hold ARB. Push fluids. Will recheck this afternoon. May need medical consult/admit if doesn't improve.   Virgel Manifold, MD 12/28/14 [phone]

## 2014-12-28 NOTE — ED Notes (Signed)
Portable x-ray at the bedside.  

## 2014-12-28 NOTE — ED Notes (Signed)
Discussed pt lab work with dr Wilson Singer. He advises to hold pt losartan today and encourage pt to increase her fluid intake. Will recheck renal function this afternoon,

## 2014-12-28 NOTE — ED Notes (Signed)
Reviewed bp readings with Dr. Sabra Heck, order to collect i stat lactic acid, chemistries, and cbc.

## 2014-12-28 NOTE — BH Assessment (Signed)
Per Estill Bamberg from [place] pt is being considered for placement. They need a chest x-ray and updated EKG sent to [phone]. Informed pt's nurse.   Lear Ng, [facility] Triage Specialist 12/28/2014 4:29 AM

## 2014-12-29 DIAGNOSIS — I495 Sick sinus syndrome: Secondary | ICD-10-CM

## 2014-12-29 DIAGNOSIS — D696 Thrombocytopenia, unspecified: Secondary | ICD-10-CM

## 2014-12-29 DIAGNOSIS — R45850 Homicidal ideations: Secondary | ICD-10-CM

## 2014-12-29 DIAGNOSIS — N179 Acute kidney failure, unspecified: Secondary | ICD-10-CM

## 2014-12-29 DIAGNOSIS — N189 Chronic kidney disease, unspecified: Secondary | ICD-10-CM

## 2014-12-29 LAB — COMPREHENSIVE METABOLIC PANEL
ALBUMIN: 2.9 g/dL — AB (ref 3.5–5.2)
ALT: 27 U/L (ref 0–35)
ANION GAP: 7 (ref 5–15)
AST: 27 U/L (ref 0–37)
Alkaline Phosphatase: 77 U/L (ref 39–117)
BILIRUBIN TOTAL: 0.6 mg/dL (ref 0.3–1.2)
BUN: 45 mg/dL — AB (ref 6–23)
CALCIUM: 8.3 mg/dL — AB (ref 8.4–10.5)
CO2: 22 mmol/L (ref 19–32)
CREATININE: 2.9 mg/dL — AB (ref 0.50–1.10)
Chloride: 108 mmol/L (ref 96–112)
GFR calc Af Amer: 16 mL/min — ABNORMAL LOW (ref >90)
GFR calc non Af Amer: 14 mL/min — ABNORMAL LOW (ref >90)
Glucose, Bld: 161 mg/dL — ABNORMAL HIGH (ref 70–99)
Potassium: 5.2 mmol/L — ABNORMAL HIGH (ref 3.5–5.1)
Sodium: 137 mmol/L (ref 135–145)
Total Protein: 6.6 g/dL (ref 6.0–8.3)

## 2014-12-29 LAB — PROTIME-INR
INR: 1.08 (ref 0.00–1.49)
Prothrombin Time: 14.2 seconds (ref 11.6–15.2)

## 2014-12-29 LAB — CBC
HCT: 32.4 % — ABNORMAL LOW (ref 36.0–46.0)
HEMOGLOBIN: 10.2 g/dL — AB (ref 12.0–15.0)
MCH: 29.5 pg (ref 26.0–34.0)
MCHC: 31.5 g/dL (ref 30.0–36.0)
MCV: 93.6 fL (ref 78.0–100.0)
Platelets: 119 10*3/uL — ABNORMAL LOW (ref 150–400)
RBC: 3.46 MIL/uL — ABNORMAL LOW (ref 3.87–5.11)
RDW: 13.7 % (ref 11.5–15.5)
WBC: 6 10*3/uL (ref 4.0–10.5)

## 2014-12-29 LAB — HEMOGLOBIN A1C
HEMOGLOBIN A1C: 6.4 % — AB (ref 4.8–5.6)
Mean Plasma Glucose: 137 mg/dL

## 2014-12-29 LAB — GLUCOSE, CAPILLARY
GLUCOSE-CAPILLARY: 172 mg/dL — AB (ref 70–99)
GLUCOSE-CAPILLARY: 132 mg/dL — AB (ref 70–99)
Glucose-Capillary: 148 mg/dL — ABNORMAL HIGH (ref 70–99)

## 2014-12-29 LAB — HAPTOGLOBIN: HAPTOGLOBIN: 131 mg/dL (ref 34–200)

## 2014-12-29 LAB — APTT: APTT: 29 s (ref 24–37)

## 2014-12-29 MED ORDER — HYDROCODONE-ACETAMINOPHEN 5-325 MG PO TABS
1.0000 | ORAL_TABLET | Freq: Four times a day (QID) | ORAL | Status: DC | PRN
  Administered 2014-12-29 – 2015-01-01 (×4): 1 via ORAL
  Filled 2014-12-29 (×5): qty 1

## 2014-12-29 MED ORDER — SODIUM POLYSTYRENE SULFONATE 15 GM/60ML PO SUSP
30.0000 g | Freq: Once | ORAL | Status: AC
  Administered 2014-12-29: 30 g via ORAL
  Filled 2014-12-29: qty 120
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
## 2014-12-29 NOTE — Progress Notes (Signed)
Call [address] at [facility] and asked that patient be set up for tele-psych. Primary RN will call back when patient is set up and ready for tele-psych.  Serena Colonel, FNP-BC [place] 12/29/2014 1:02 PM
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
## 2014-12-29 NOTE — Progress Notes (Signed)
  Echocardiogram 2D Echocardiogram has been performed.  [name] [name] 12/29/2014, 2:52 PM

## 2014-12-30 LAB — CBC
HEMATOCRIT: 29.9 % — AB (ref 36.0–46.0)
Hemoglobin: 9.4 g/dL — ABNORMAL LOW (ref 12.0–15.0)
MCH: 29.1 pg (ref 26.0–34.0)
MCHC: 31.4 g/dL (ref 30.0–36.0)
MCV: 92.6 fL (ref 78.0–100.0)
PLATELETS: 114 10*3/uL — AB (ref 150–400)
RBC: 3.23 MIL/uL — ABNORMAL LOW (ref 3.87–5.11)
RDW: 13.5 % (ref 11.5–15.5)
WBC: 6 10*3/uL (ref 4.0–10.5)

## 2014-12-30 LAB — BASIC METABOLIC PANEL
ANION GAP: 6 (ref 5–15)
BUN: 33 mg/dL — AB (ref 6–23)
CALCIUM: 8.3 mg/dL — AB (ref 8.4–10.5)
CHLORIDE: 110 mmol/L (ref 96–112)
CO2: 23 mmol/L (ref 19–32)
Creatinine, Ser: 2.05 mg/dL — ABNORMAL HIGH (ref 0.50–1.10)
GFR calc non Af Amer: 21 mL/min — ABNORMAL LOW (ref >90)
GFR, EST AFRICAN AMERICAN: 25 mL/min — AB (ref >90)
Glucose, Bld: 146 mg/dL — ABNORMAL HIGH (ref 70–99)
Potassium: 4.1 mmol/L (ref 3.5–5.1)
Sodium: 139 mmol/L (ref 135–145)

## 2014-12-30 LAB — URINALYSIS, ROUTINE W REFLEX MICROSCOPIC
Bilirubin Urine: NEGATIVE
GLUCOSE, UA: NEGATIVE mg/dL
HGB URINE DIPSTICK: NEGATIVE
KETONES UR: NEGATIVE mg/dL
LEUKOCYTES UA: NEGATIVE
Nitrite: NEGATIVE
Protein, ur: NEGATIVE mg/dL
Specific Gravity, Urine: 1.012 (ref 1.005–1.030)
Urobilinogen, UA: 1 mg/dL (ref 0.0–1.0)
pH: 6 (ref 5.0–8.0)

## 2014-12-30 LAB — GLUCOSE, CAPILLARY
GLUCOSE-CAPILLARY: 131 mg/dL — AB (ref 70–99)
Glucose-Capillary: 112 mg/dL — ABNORMAL HIGH (ref 70–99)
Glucose-Capillary: 117 mg/dL — ABNORMAL HIGH (ref 70–99)
Glucose-Capillary: 134 mg/dL — ABNORMAL HIGH (ref 70–99)

## 2014-12-30 MED ORDER — DOCUSATE SODIUM 100 MG PO CAPS
100.0000 mg | ORAL_CAPSULE | Freq: Two times a day (BID) | ORAL | Status: DC
Start: 2014-12-30 — End: 2021-06-19

## 2014-12-30 MED ORDER — ALPRAZOLAM 1 MG PO TABS: 1.0000 mg | ORAL_TABLET | Freq: Two times a day (BID) | ORAL | Status: DC | PRN

## 2014-12-30 MED ORDER — HYDROCODONE-ACETAMINOPHEN 5-325 MG PO TABS: 1.0000 | ORAL_TABLET | Freq: Four times a day (QID) | ORAL | Status: DC | PRN
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
## 2014-12-30 NOTE — Progress Notes (Signed)
Patient's granddaughter came to visit her and asked for updates on her grandmother's condition. Patient said it was ok to talk to granddaughter; I proceded to tell patient's granddaughter that the patient was admitted to our unit due to elevated kidney function labs. Patient's granddaughter asked about the patient's heart saying that her grandmother said she had a bad heart and she was really sick and was going to be shipped off to another hospital. I asked the patient if she remembered what happened yesterday (referring to the Echo). Patient said yes, they took me down for tests. I confirmed her response, and told the granddaughter that the patient stated that she wanted to go live in a nursing home. Patient's granddaughter said to her grandmother, "Royann Shivers why are you telling us you're so sick you've had bad kidneys for years you aint that sick you're just trying to make [place] feel bad." I asked patient if she wanted me to tell her family more, she said " NO! Don't tell them any more of my businessCurator (unsure of relationship) stated, Symone (granddaughter) is your power of attorney, you don't want her to know?" Granddaughter stated, "It's okay she doesn't want [place] to know her business, I'm going to let her do what she wants. This is her third time going to the nursing home and I'm going to let her stay." Patient stated she is very stressed and depressed. The patient's granddaughter stated, "You're going to be even more stressed out and depressed in the nursing home." Granddaughter then preceded to say, "I'm leaving grandma, since you don't want [place] to know your business," and left abruptly.    After visitors cleared the room, patient begged me several times not to tell family any more of her business. I reminded her that at first she said it was ok, but that I would honor her requests and I would not share any more of her business.    Penni Bombard, RN 12/30/2014

## 2014-12-30 NOTE — Clinical Social Work Placement (Addendum)
Clinical Social Work Department CLINICAL SOCIAL WORK PLACEMENT NOTE 12/30/2014  Patient:  [name] [name], [name] [name]  Account Number:  [id number] Admit date:  12/26/2014  Clinical Social Worker:  Carrington Clamp, LCSWA  Date/time:  12/30/2014 05:04 PM  Clinical Social Work is seeking post-discharge placement for this patient at the following level of care:   [place]   (*CSW will update this form in Epic as items are completed)   12/30/2014  Patient/family provided with [place] Department of Clinical Social Work's list of facilities offering this level of care within the geographic area requested by the patient (or if unable, by the patient's family).  12/30/2014  Patient/family informed of their freedom to choose among providers that offer the needed level of care, that participate in Medicare, Medicaid or managed care program needed by the patient, have an available bed and are willing to accept the patient.  12/30/2014  Patient/family informed of [facility]' ownership interest in [facility], as well as of the fact that they are under no obligation to receive care at this facility.  PASARR submitted to EDS on Existing PASARR number received on Existing   FL2 transmitted to all facilities in geographic area requested by pt/family on  12/30/2014 FL2 transmitted to all facilities within larger geographic area on   Patient informed that his/her managed care company has contracts with or will negotiate with  certain facilities, including the following:     Patient/family informed of bed offers received: 01/01/15  Patient chooses bed at [facility] Physician recommends and patient chooses bed at    Patient to be transferred to [facility] on 01/02/15  Patient to be transferred to facility by ambulance Patient and family notified of transfer on 01/02/15 Name of family member notified: Dianne Dun (granddaughter) [phone].    The following physician request were  entered in Epic:   Additional Comments:

## 2014-12-31 DIAGNOSIS — F322 Major depressive disorder, single episode, severe without psychotic features: Secondary | ICD-10-CM

## 2014-12-31 LAB — BASIC METABOLIC PANEL
Anion gap: 3 — ABNORMAL LOW (ref 5–15)
BUN: 22 mg/dL (ref 6–23)
CALCIUM: 8.3 mg/dL — AB (ref 8.4–10.5)
CO2: 23 mmol/L (ref 19–32)
CREATININE: 1.63 mg/dL — AB (ref 0.50–1.10)
Chloride: 113 mmol/L — ABNORMAL HIGH (ref 96–112)
GFR calc Af Amer: 33 mL/min — ABNORMAL LOW (ref >90)
GFR, EST NON AFRICAN AMERICAN: 28 mL/min — AB (ref >90)
Glucose, Bld: 87 mg/dL (ref 70–99)
Potassium: 4.7 mmol/L (ref 3.5–5.1)
SODIUM: 139 mmol/L (ref 135–145)

## 2014-12-31 LAB — CBC
HEMATOCRIT: 29.7 % — AB (ref 36.0–46.0)
HEMOGLOBIN: 9.4 g/dL — AB (ref 12.0–15.0)
MCH: 29.5 pg (ref 26.0–34.0)
MCHC: 31.6 g/dL (ref 30.0–36.0)
MCV: 93.1 fL (ref 78.0–100.0)
Platelets: 158 10*3/uL (ref 150–400)
RBC: 3.19 MIL/uL — ABNORMAL LOW (ref 3.87–5.11)
RDW: 13.9 % (ref 11.5–15.5)
WBC: 6.9 10*3/uL (ref 4.0–10.5)

## 2014-12-31 LAB — GLUCOSE, CAPILLARY
GLUCOSE-CAPILLARY: 93 mg/dL (ref 70–99)
GLUCOSE-CAPILLARY: 130 mg/dL — AB (ref 70–99)
Glucose-Capillary: 98 mg/dL (ref 70–99)
Glucose-Capillary: 128 mg/dL — ABNORMAL HIGH (ref 70–99)

## 2014-12-31 MED ORDER — GABAPENTIN 600 MG PO TABS
300.0000 mg | ORAL_TABLET | Freq: Two times a day (BID) | ORAL | Status: DC
  Filled 2014-12-31 (×2): qty 0.5

## 2014-12-31 MED ORDER — GABAPENTIN 300 MG PO CAPS
300.0000 mg | ORAL_CAPSULE | Freq: Two times a day (BID) | ORAL | Status: DC
  Administered 2014-12-31 (×2): 300 mg via ORAL
  Filled 2014-12-31 (×4): qty 1

## 2014-12-31 NOTE — Clinical Social Work Note (Signed)
CSW spoke with Dr. Carles Collet and discussed psychiatric clearance for patient to d/c to SNF and to submit for PASARR. Dr. Carles Collet to consult with psychiatry. CSW to continue to follow.  [place], [place] Weekend Clinical Social Worker [phone]
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
## 2014-12-31 NOTE — Progress Notes (Signed)
Utilization review completed.  
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
## 2015-01-01 DIAGNOSIS — R609 Edema, unspecified: Secondary | ICD-10-CM

## 2015-01-01 LAB — BASIC METABOLIC PANEL
Anion gap: 5 (ref 5–15)
BUN: 21 mg/dL (ref 6–23)
CHLORIDE: 110 mmol/L (ref 96–112)
CO2: 22 mmol/L (ref 19–32)
CREATININE: 1.62 mg/dL — AB (ref 0.50–1.10)
Calcium: 8.6 mg/dL (ref 8.4–10.5)
GFR calc Af Amer: 33 mL/min — ABNORMAL LOW (ref >90)
GFR calc non Af Amer: 28 mL/min — ABNORMAL LOW (ref >90)
Glucose, Bld: 96 mg/dL (ref 70–99)
POTASSIUM: 4.9 mmol/L (ref 3.5–5.1)
Sodium: 137 mmol/L (ref 135–145)

## 2015-01-01 LAB — GLUCOSE, CAPILLARY
GLUCOSE-CAPILLARY: 135 mg/dL — AB (ref 70–99)
GLUCOSE-CAPILLARY: 133 mg/dL — AB (ref 70–99)
GLUCOSE-CAPILLARY: 83 mg/dL (ref 70–99)
Glucose-Capillary: 97 mg/dL (ref 70–99)
Glucose-Capillary: 131 mg/dL — ABNORMAL HIGH (ref 70–99)

## 2015-01-01 LAB — CBC
HCT: 32.6 % — ABNORMAL LOW (ref 36.0–46.0)
HEMOGLOBIN: 10.3 g/dL — AB (ref 12.0–15.0)
MCH: 29.9 pg (ref 26.0–34.0)
MCHC: 31.6 g/dL (ref 30.0–36.0)
MCV: 94.5 fL (ref 78.0–100.0)
Platelets: 120 10*3/uL — ABNORMAL LOW (ref 150–400)
RBC: 3.45 MIL/uL — ABNORMAL LOW (ref 3.87–5.11)
RDW: 14.1 % (ref 11.5–15.5)
WBC: 7.2 10*3/uL (ref 4.0–10.5)

## 2015-01-01 MED ORDER — GABAPENTIN 300 MG PO CAPS: 300.0000 mg | ORAL_CAPSULE | Freq: Every day | ORAL | Status: DC

## 2015-01-01 MED ORDER — GABAPENTIN 300 MG PO CAPS
300.0000 mg | ORAL_CAPSULE | Freq: Every day | ORAL | Status: DC
  Administered 2015-01-01: 300 mg via ORAL
  Filled 2015-01-01 (×2): qty 1
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
## 2015-01-01 NOTE — Progress Notes (Signed)
VASCULAR LAB PRELIMINARY  PRELIMINARY  PRELIMINARY  PRELIMINARY  BLEV duplex completed.    Preliminary report: Negative DVT, superficial thrombus or Baker's Cyst bilaterally.  August Albino, RVT 01/01/2015, 12:21 PM

## 2015-01-01 NOTE — Clinical Social Work Note (Signed)
CSW contacted financial counselor to get patient's insurance information as Medicaid is all that shows on face sheet. Patient has [facility] and this information was added to FL-2 along with [facility] insurance number. CSW re-faxed patient out, providing updated FL-2 and psych notes so that SNF's can re-evaluate patient.   Janila Arrazola Givens, MSW, LCSW Licensed Clinical Social Worker [place] [phone]
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
## 2015-01-01 NOTE — Progress Notes (Signed)
CARE MANAGEMENT NOTE 01/01/2015  Patient:  [name] [name]   Account Number:  [id number]  Date Initiated:  01/01/2015  Documentation initiated by:  [facility]  Subjective/Objective Assessment:   Acute on Chronic on CKD 4, hyperkalemia     Action/Plan:   Anticipated DC Date:  01/02/2015   Anticipated DC Plan:  [place]  In-house referral  Clinical Social Worker      DC Planning Services  CM consult      Choice offered to / List presented to:             Status of service:  Completed, signed off Medicare Important Message given?  YES (If response is "NO", the following Medicare IM given date fields will be blank) Date Medicare IM given:  01/01/2015 Medicare IM given by:  [facility] Date Additional Medicare IM given:   Additional Medicare IM given by:    Discharge Disposition:  [place]  Per UR Regulation:    If discussed at Long Length of Stay Meetings, dates discussed:    Comments:  01/01/2015 1200 NCM spoke to pt. Chart reviewed. CSW following for SNF placement. Jonnie Finner RN CCM Case Mgmt phone [phone]

## 2015-01-02 LAB — GLUCOSE, CAPILLARY
GLUCOSE-CAPILLARY: 128 mg/dL — AB (ref 70–99)
GLUCOSE-CAPILLARY: 85 mg/dL (ref 70–99)

## 2015-01-02 NOTE — Progress Notes (Signed)
Pt remained clinically and hemodynamically stable.  Venous duplex of lower extremities negative for DVT.  Continue gabapentin for neuropathic symptoms.  Pt cleared by psychiatry for d/c.  CBGs remained controlled.  No bleeding complications.  DTat

## 2015-01-02 NOTE — Progress Notes (Signed)
Patient Discharge: Disposition: Patient discharged to [facility], given report to the nurse Dub Mikes). IV: Discontinued IV before discharge. Transportation: Patient transported via EMS. Belongings: She took all her belongings with her including from security safe.

## 2018-08-09 DIAGNOSIS — I6523 Occlusion and stenosis of bilateral carotid arteries: Secondary | ICD-10-CM | POA: Insufficient documentation

## 2018-12-14 DIAGNOSIS — I5030 Unspecified diastolic (congestive) heart failure: Secondary | ICD-10-CM | POA: Insufficient documentation

## 2020-11-06 ENCOUNTER — Other Ambulatory Visit: Payer: Self-pay

## 2020-11-06 ENCOUNTER — Encounter: Payer: Self-pay | Admitting: *Deleted

## 2020-11-06 DIAGNOSIS — N3000 Acute cystitis without hematuria: Secondary | ICD-10-CM | POA: Insufficient documentation

## 2020-11-06 DIAGNOSIS — N184 Chronic kidney disease, stage 4 (severe): Secondary | ICD-10-CM | POA: Diagnosis not present

## 2020-11-06 DIAGNOSIS — I129 Hypertensive chronic kidney disease with stage 1 through stage 4 chronic kidney disease, or unspecified chronic kidney disease: Secondary | ICD-10-CM | POA: Diagnosis not present

## 2020-11-06 DIAGNOSIS — E1122 Type 2 diabetes mellitus with diabetic chronic kidney disease: Secondary | ICD-10-CM | POA: Insufficient documentation

## 2020-11-06 DIAGNOSIS — K5903 Drug induced constipation: Secondary | ICD-10-CM | POA: Insufficient documentation

## 2020-11-06 DIAGNOSIS — R109 Unspecified abdominal pain: Secondary | ICD-10-CM | POA: Diagnosis present

## 2020-11-06 LAB — COMPREHENSIVE METABOLIC PANEL
ALT: 16 U/L (ref 0–44)
AST: 21 U/L (ref 15–41)
Albumin: 4 g/dL (ref 3.5–5.0)
Alkaline Phosphatase: 65 U/L (ref 38–126)
Anion gap: 12 (ref 5–15)
BUN: 41 mg/dL — ABNORMAL HIGH (ref 8–23)
CO2: 25 mmol/L (ref 22–32)
Calcium: 9.5 mg/dL (ref 8.9–10.3)
Chloride: 104 mmol/L (ref 98–111)
Creatinine, Ser: 2.37 mg/dL — ABNORMAL HIGH (ref 0.44–1.00)
GFR, Estimated: 19 mL/min — ABNORMAL LOW (ref >60)
Glucose, Bld: 113 mg/dL — ABNORMAL HIGH (ref 70–99)
Potassium: 5.3 mmol/L — ABNORMAL HIGH (ref 3.5–5.1)
Sodium: 141 mmol/L (ref 135–145)
Total Bilirubin: 1.2 mg/dL (ref 0.3–1.2)
Total Protein: 8.5 g/dL — ABNORMAL HIGH (ref 6.5–8.1)

## 2020-11-06 LAB — CBC
HCT: 38.3 % (ref 36.0–46.0)
Hemoglobin: 11.7 g/dL — ABNORMAL LOW (ref 12.0–15.0)
MCH: 30.4 pg (ref 26.0–34.0)
MCHC: 30.5 g/dL (ref 30.0–36.0)
MCV: 99.5 fL (ref 80.0–100.0)
Platelets: 159 10*3/uL (ref 150–400)
RBC: 3.85 MIL/uL — ABNORMAL LOW (ref 3.87–5.11)
RDW: 14 % (ref 11.5–15.5)
WBC: 11.2 10*3/uL — ABNORMAL HIGH (ref 4.0–10.5)
nRBC: 0 % (ref 0.0–0.2)

## 2020-11-06 LAB — LIPASE, BLOOD: Lipase: 41 U/L (ref 11–51)

## 2020-11-06 MED ORDER — ONDANSETRON HCL 4 MG/2ML IJ SOLN: 4.0000 mg | Freq: Once | INTRAMUSCULAR | Status: DC

## 2020-11-06 MED ORDER — ONDANSETRON 4 MG PO TBDP
4.0000 mg | ORAL_TABLET | Freq: Once | ORAL | Status: AC
  Administered 2020-11-06: 4 mg via ORAL
  Filled 2020-11-06: qty 1

## 2020-11-06 NOTE — ED Notes (Signed)
Pt urinating and vomiting in triage. Placed on bedpan and attempting a BM.

## 2020-11-06 NOTE — ED Notes (Signed)
Pt had a very large BM with a large hard stool ball. Pt reports relief after BM. Pt cleaned and placed in a clean depends. Pt back in subwait watching TV at this time. Call bell in reach and pt in NAD at this time.

## 2020-11-06 NOTE — ED Triage Notes (Signed)
Pt to ED reporting constipation x 3 days with vomiting starting today and generalized abd pain. Pt has poor mobility at baseline. Recently discharged from a nursing home to live with daughter 1 month ago.

## 2020-11-06 NOTE — ED Notes (Signed)
RN unable to obtain labs. Lab called for collect.

## 2020-11-07 ENCOUNTER — Emergency Department
Admission: EM | Admit: 2020-11-07 | Discharge: 2020-11-07 | Disposition: A | Discharge status: Home / Self Care | Payer: Medicare Other | Attending: Emergency Medicine | Admitting: Emergency Medicine

## 2020-11-07 ENCOUNTER — Emergency Department: Payer: Medicare Other

## 2020-11-07 DIAGNOSIS — K5903 Drug induced constipation: Secondary | ICD-10-CM

## 2020-11-07 DIAGNOSIS — N3000 Acute cystitis without hematuria: Secondary | ICD-10-CM

## 2020-11-07 LAB — URINALYSIS, COMPLETE (UACMP) WITH MICROSCOPIC
Bilirubin Urine: NEGATIVE
Glucose, UA: NEGATIVE mg/dL
Hgb urine dipstick: NEGATIVE
Ketones, ur: 5 mg/dL — AB
Nitrite: NEGATIVE
Protein, ur: NEGATIVE mg/dL
Specific Gravity, Urine: 1.014 (ref 1.005–1.030)
Squamous Epithelial / LPF: NONE SEEN (ref 0–5)
pH: 8 (ref 5.0–8.0)

## 2020-11-07 LAB — TROPONIN I (HIGH SENSITIVITY): Troponin I (High Sensitivity): 10 ng/L (ref <18)

## 2020-11-07 MED ORDER — ACETAMINOPHEN 500 MG PO TABS
1000.0000 mg | ORAL_TABLET | Freq: Once | ORAL | Status: AC
  Administered 2020-11-07: 04:00:00 1000 mg via ORAL
  Filled 2020-11-07: qty 2

## 2020-11-07 MED ORDER — ONDANSETRON 4 MG PO TBDP: 4.0000 mg | ORAL_TABLET | Freq: Three times a day (TID) | ORAL | 0 refills | Status: DC | PRN

## 2020-11-07 MED ORDER — CEPHALEXIN 500 MG PO CAPS: 500.0000 mg | ORAL_CAPSULE | Freq: Two times a day (BID) | ORAL | 0 refills | Status: AC

## 2020-11-07 MED ORDER — LACTATED RINGERS IV BOLUS
1000.0000 mL | Freq: Once | INTRAVENOUS | Status: AC
  Administered 2020-11-07: 07:00:00 1000 mL via INTRAVENOUS

## 2020-11-07 MED ORDER — CEPHALEXIN 500 MG PO CAPS
500.0000 mg | ORAL_CAPSULE | Freq: Once | ORAL | Status: AC
  Administered 2020-11-07: 07:00:00 500 mg via ORAL
  Filled 2020-11-07: qty 1

## 2020-11-07 MED ORDER — ONDANSETRON HCL 4 MG/2ML IJ SOLN
4.0000 mg | Freq: Once | INTRAMUSCULAR | Status: AC
  Administered 2020-11-07: 03:00:00 4 mg via INTRAVENOUS
  Filled 2020-11-07: qty 2

## 2020-11-07 MED ORDER — LACTATED RINGERS IV BOLUS
1000.0000 mL | Freq: Once | INTRAVENOUS | Status: AC
  Administered 2020-11-07: 03:00:00 1000 mL via INTRAVENOUS

## 2020-11-07 MED ORDER — FENTANYL CITRATE (PF) 100 MCG/2ML IJ SOLN
50.0000 ug | Freq: Once | INTRAMUSCULAR | Status: DC
  Filled 2020-11-07: qty 2
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
## 2020-11-07 NOTE — ED Notes (Addendum)
Hypotension noted. Dr Alfred Levins notified. LR bolus started. Fentanyl held

## 2020-11-07 NOTE — ED Notes (Addendum)
Responded to call bell. Pt complaining of "accidentally pulling out my tube". Pt IV catheter laying in bed with LR still running. Linens, blankets and robe soaked. Pt cleaned and linens changed. Pt resting comfortably. Three attempts ED staff to establish new IV access unsuccessful. IV team consult placed. Dr Alfred Levins notified.

## 2020-11-07 NOTE — ED Notes (Signed)
Patient transported to CT 

## 2020-11-07 NOTE — ED Notes (Signed)
Pt verbalized understanding of d/c instructions at this time. Pt denies further questions. Pt assisted to lobby to wait for ride in wheelchair at this time.

## 2020-11-07 NOTE — ED Notes (Signed)
Called granddaughter Cleo to come pick up pt. Per granddaughter, will come get pt.

## 2020-11-07 NOTE — Discharge Instructions (Addendum)
Constipation: Take colace twice a day everyday. Take senna once a day at bedtime. Take daily probiotics. Take one cap full of Miralax with water or juice every day. Drink plenty of fluids and eat a diet rich in fiber.   You have been seen in the Emergency Department (ED)  today for a urinary tract infection.  Most UTIs are caused by bacteria and need to be treated with antibiotics. It is important to complete your treatment so that the infection does not get worse. Take your antibiotics fully even if your symptoms start to get better after the first few doses. Drink PLENTY of fluids to help clear the infection.  Follow-up with your doctor or return to the ER immediately if your symptoms are getting worse, if you develop a fever, if you develop abdominal or flank pain, or if you start to vomit. Otherwise follow up with your doctor in 1 week if your symptoms are improving.   When should you call for help?  Call your doctor now or seek immediate medical care if:  Symptoms such as a fever, chills, nausea, or vomiting get worse or happen for the first time.  You have new pain in your back just below your rib cage. This is called flank pain.  There is new blood or pus in your urine.  You are not able to take or keep down your antibiotics. Your symptoms are not getting better after 48 hours of antibiotic treatment  Watch closely for changes in your health, and be sure to contact your doctor if:  You are not getting better after taking an antibiotic for 2 days.  Your symptoms go away but then come back.   How can you care for yourself at home?  Take your antibiotics as prescribed. Do not stop taking them just because you feel better. You need to take the full course of antibiotics.  Take your medicines exactly as prescribed. Your doctor may have prescribed a medicine, such as phenazopyridine (Pyridium), to help relieve pain when you urinate. This turns your urine orange. You may stop taking it when  your symptoms get better. But be sure to take all of your antibiotics, which treat the infection.  Drink extra water and juices such as cranberry and blueberry juices for the next day or two. This will help make the urine less concentrated and help wash out the bacteria causing the infection. (If you have kidney, heart, or liver disease and have to limit your fluids, talk with your doctor before you increase your fluid intake.)  Avoid drinks that are carbonated or have caffeine. They can irritate the bladder.  Urinate often. Try to empty your bladder each time.  To relieve pain, take a hot bath or lay a heating pad (set on low) over your lower belly or genital area. Never go to sleep with a heating pad in place.  To help prevent UTIs  Drink plenty of fluids, enough so that your urine is light yellow or clear like water. If you have kidney, heart, or liver disease and have to limit fluids, talk with your doctor before you increase the amount of fluids you drink.  Urinate when you have the urge. Do not hold your urine for a long time. Urinate before you go to sleep.  Keep your vagina/ penis clean.

## 2020-11-07 NOTE — ED Notes (Signed)
Pt changed into clean brief at this time. Pt changed into personal clothing at this time.

## 2020-11-09 LAB — URINE CULTURE: Culture: >=100000 — AB

## 2020-11-11 NOTE — Progress Notes (Signed)
ED Antimicrobial Stewardship Positive Culture Follow Up   [name] [name] is an 87 y.o. female who presented to [facility] on 11/07/2020 with a chief complaint of abdominal pain. Patient also complained of dysuria and urinary frequency. Attempted to call patient 11/10/2020 and 11/11/20 - left voicemail on 11/11/20 at [phone] for patient to call back. If patient calls back, will inform her of urine culture report and if still having symptoms, new antibiotic prescription has been called in to [facility] on  [address] [id number]      Chief Complaint  Patient presents with  . Abdominal Pain    Recent Results (from the past 720 hour(s))  Urine Culture     Status: Abnormal   Collection Time: 11/07/20  3:57 AM   Specimen: Urine, Random  Result Value Ref Range Status   Specimen Description   Final    URINE, RANDOM Performed at [facility], [address] [place], [place] [address]    Special Requests   Final    NONE Performed at [facility], [place] [place], [place] [address]    Culture (A)  Final    >=100,000 COLONIES/mL ESCHERICHIA COLI 50,000 COLONIES/mL PROTEUS MIRABILIS    Report Status 11/09/2020 FINAL  Final   Organism ID, Bacteria ESCHERICHIA COLI (A)  Final   Organism ID, Bacteria PROTEUS MIRABILIS (A)  Final      Susceptibility   Escherichia coli - MIC*    AMPICILLIN >=32 RESISTANT Resistant     CEFAZOLIN >=64 RESISTANT Resistant     CEFEPIME <=0.12 SENSITIVE Sensitive     CEFTRIAXONE 1 SENSITIVE Sensitive     CIPROFLOXACIN <=0.25 SENSITIVE Sensitive     GENTAMICIN <=1 SENSITIVE Sensitive     IMIPENEM <=0.25 SENSITIVE Sensitive     NITROFURANTOIN <=16 SENSITIVE Sensitive     TRIMETH/SULFA <=20 SENSITIVE Sensitive     AMPICILLIN/SULBACTAM >=32 RESISTANT Resistant     PIP/TAZO <=4 SENSITIVE Sensitive     * >=100,000 COLONIES/mL ESCHERICHIA COLI   Proteus mirabilis - MIC*    AMPICILLIN <=2 SENSITIVE Sensitive     CEFAZOLIN 8 SENSITIVE  Sensitive     CEFEPIME <=0.12 SENSITIVE Sensitive     CEFTRIAXONE <=0.25 SENSITIVE Sensitive     CIPROFLOXACIN <=0.25 SENSITIVE Sensitive     GENTAMICIN <=1 SENSITIVE Sensitive     IMIPENEM 2 SENSITIVE Sensitive     NITROFURANTOIN 256 RESISTANT Resistant     TRIMETH/SULFA <=20 SENSITIVE Sensitive     AMPICILLIN/SULBACTAM <=2 SENSITIVE Sensitive     PIP/TAZO <=4 SENSITIVE Sensitive     * 50,000 COLONIES/mL PROTEUS MIRABILIS    [x]  Treated with Cephalexin, organism resistant to prescribed antimicrobial  New antibiotic prescription: Ciprofloxacin 500mg  q24 h x 3 days  ED Provider: Dr.Paduchowski   Sherilyn Banker, PharmD Pharmacy Resident  11/11/2020 10:12 AM

## 2020-11-16 ENCOUNTER — Other Ambulatory Visit: Payer: Self-pay

## 2020-11-16 DIAGNOSIS — Z20822 Contact with and (suspected) exposure to covid-19: Secondary | ICD-10-CM | POA: Diagnosis not present

## 2020-11-16 DIAGNOSIS — N184 Chronic kidney disease, stage 4 (severe): Secondary | ICD-10-CM | POA: Diagnosis not present

## 2020-11-16 DIAGNOSIS — R300 Dysuria: Secondary | ICD-10-CM | POA: Diagnosis not present

## 2020-11-16 DIAGNOSIS — I129 Hypertensive chronic kidney disease with stage 1 through stage 4 chronic kidney disease, or unspecified chronic kidney disease: Secondary | ICD-10-CM | POA: Diagnosis not present

## 2020-11-16 DIAGNOSIS — R55 Syncope and collapse: Secondary | ICD-10-CM | POA: Diagnosis not present

## 2020-11-16 DIAGNOSIS — E1122 Type 2 diabetes mellitus with diabetic chronic kidney disease: Secondary | ICD-10-CM | POA: Insufficient documentation

## 2020-11-16 LAB — COMPREHENSIVE METABOLIC PANEL
ALT: 10 U/L (ref 0–44)
AST: 17 U/L (ref 15–41)
Albumin: 3.4 g/dL — ABNORMAL LOW (ref 3.5–5.0)
Alkaline Phosphatase: 54 U/L (ref 38–126)
Anion gap: 9 (ref 5–15)
BUN: 21 mg/dL (ref 8–23)
CO2: 21 mmol/L — ABNORMAL LOW (ref 22–32)
Calcium: 9 mg/dL (ref 8.9–10.3)
Chloride: 108 mmol/L (ref 98–111)
Creatinine, Ser: 1.79 mg/dL — ABNORMAL HIGH (ref 0.44–1.00)
GFR, Estimated: 27 mL/min — ABNORMAL LOW (ref >60)
Glucose, Bld: 190 mg/dL — ABNORMAL HIGH (ref 70–99)
Potassium: 5.2 mmol/L — ABNORMAL HIGH (ref 3.5–5.1)
Sodium: 138 mmol/L (ref 135–145)
Total Bilirubin: 0.8 mg/dL (ref 0.3–1.2)
Total Protein: 7.7 g/dL (ref 6.5–8.1)

## 2020-11-16 LAB — CBC
HCT: 39 % (ref 36.0–46.0)
Hemoglobin: 11.5 g/dL — ABNORMAL LOW (ref 12.0–15.0)
MCH: 30.2 pg (ref 26.0–34.0)
MCHC: 29.5 g/dL — ABNORMAL LOW (ref 30.0–36.0)
MCV: 102.4 fL — ABNORMAL HIGH (ref 80.0–100.0)
Platelets: 168 10*3/uL (ref 150–400)
RBC: 3.81 MIL/uL — ABNORMAL LOW (ref 3.87–5.11)
RDW: 14.6 % (ref 11.5–15.5)
WBC: 6.2 10*3/uL (ref 4.0–10.5)
nRBC: 0 % (ref 0.0–0.2)

## 2020-11-16 LAB — TROPONIN I (HIGH SENSITIVITY)
Troponin I (High Sensitivity): 12 ng/L (ref <18)
Troponin I (High Sensitivity): 11 ng/L (ref <18)

## 2020-11-16 NOTE — ED Notes (Signed)
Secure chat meassage sent to dr. Archie Balboa to see if ct head needed. No new orders given by dr. Archie Balboa

## 2020-11-16 NOTE — ED Triage Notes (Signed)
Pt states left upper quadrant abd pain that has been ongoing for "awhile". Pt states "I had one of my spells today and I passed out". Pt states syncopal episode tonight, but did not have injury from syncope.

## 2020-11-16 NOTE — ED Notes (Signed)
PER EMS: Patient presents to Emergency Department via [facility] from HOME with complaints of "feeling a syncopal episode coming on so she sat down", denies head strike, A and O x 4   History of DM type 1

## 2020-11-17 ENCOUNTER — Emergency Department
Admission: EM | Admit: 2020-11-17 | Discharge: 2020-11-17 | Disposition: A | Discharge status: Home / Self Care | Payer: Medicare Other | Attending: Emergency Medicine | Admitting: Emergency Medicine

## 2020-11-17 ENCOUNTER — Emergency Department: Payer: Medicare Other

## 2020-11-17 DIAGNOSIS — R55 Syncope and collapse: Secondary | ICD-10-CM

## 2020-11-17 LAB — URINALYSIS, COMPLETE (UACMP) WITH MICROSCOPIC
Bilirubin Urine: NEGATIVE
Glucose, UA: NEGATIVE mg/dL
Ketones, ur: 5 mg/dL — AB
Leukocytes,Ua: NEGATIVE
Nitrite: NEGATIVE
Protein, ur: NEGATIVE mg/dL
Specific Gravity, Urine: 1.013 (ref 1.005–1.030)
pH: 5 (ref 5.0–8.0)

## 2020-11-17 LAB — RESP PANEL BY RT-PCR (FLU A&B, COVID) ARPGX2
Influenza A by PCR: NEGATIVE
Influenza B by PCR: NEGATIVE
SARS Coronavirus 2 by RT PCR: NEGATIVE

## 2020-11-17 LAB — POC SARS CORONAVIRUS 2 AG -  ED: SARS Coronavirus 2 Ag: NEGATIVE

## 2020-11-17 NOTE — ED Notes (Signed)
This RN attempted to call pt contact x1.
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
## 2020-11-17 NOTE — ED Notes (Addendum)
RN updated pt's granddaughter that pt is ready to be discharged. Pt granddaughter stated that she won't be able to pick up pt until "8-8:30."  Pt and pt's granddaughter made aware that pt will be discharged to lobby until granddaughter can pick her up.

## 2020-11-18 LAB — URINE CULTURE

## 2021-02-04 ENCOUNTER — Other Ambulatory Visit: Payer: Self-pay

## 2021-02-04 ENCOUNTER — Emergency Department: Payer: Medicare Other

## 2021-02-04 ENCOUNTER — Emergency Department
Admission: EM | Admit: 2021-02-04 | Discharge: 2021-02-05 | Disposition: A | Discharge status: Home / Self Care | Payer: Medicare Other | Attending: Emergency Medicine | Admitting: Emergency Medicine

## 2021-02-04 DIAGNOSIS — F322 Major depressive disorder, single episode, severe without psychotic features: Secondary | ICD-10-CM | POA: Diagnosis not present

## 2021-02-04 DIAGNOSIS — N184 Chronic kidney disease, stage 4 (severe): Secondary | ICD-10-CM | POA: Diagnosis not present

## 2021-02-04 DIAGNOSIS — R45851 Suicidal ideations: Secondary | ICD-10-CM | POA: Insufficient documentation

## 2021-02-04 DIAGNOSIS — R4182 Altered mental status, unspecified: Secondary | ICD-10-CM | POA: Diagnosis not present

## 2021-02-04 DIAGNOSIS — E1122 Type 2 diabetes mellitus with diabetic chronic kidney disease: Secondary | ICD-10-CM | POA: Diagnosis not present

## 2021-02-04 DIAGNOSIS — F29 Unspecified psychosis not due to a substance or known physiological condition: Secondary | ICD-10-CM | POA: Diagnosis present

## 2021-02-04 DIAGNOSIS — I129 Hypertensive chronic kidney disease with stage 1 through stage 4 chronic kidney disease, or unspecified chronic kidney disease: Secondary | ICD-10-CM | POA: Insufficient documentation

## 2021-02-04 DIAGNOSIS — E869 Volume depletion, unspecified: Secondary | ICD-10-CM | POA: Diagnosis present

## 2021-02-04 DIAGNOSIS — E785 Hyperlipidemia, unspecified: Secondary | ICD-10-CM | POA: Diagnosis present

## 2021-02-04 DIAGNOSIS — Z20822 Contact with and (suspected) exposure to covid-19: Secondary | ICD-10-CM | POA: Diagnosis not present

## 2021-02-04 DIAGNOSIS — Z046 Encounter for general psychiatric examination, requested by authority: Secondary | ICD-10-CM | POA: Insufficient documentation

## 2021-02-04 DIAGNOSIS — T7601XA Adult neglect or abandonment, suspected, initial encounter: Secondary | ICD-10-CM

## 2021-02-04 DIAGNOSIS — D649 Anemia, unspecified: Secondary | ICD-10-CM | POA: Diagnosis present

## 2021-02-04 DIAGNOSIS — E119 Type 2 diabetes mellitus without complications: Secondary | ICD-10-CM

## 2021-02-04 DIAGNOSIS — N179 Acute kidney failure, unspecified: Secondary | ICD-10-CM | POA: Diagnosis present

## 2021-02-04 DIAGNOSIS — I10 Essential (primary) hypertension: Secondary | ICD-10-CM | POA: Diagnosis present

## 2021-02-04 LAB — COMPREHENSIVE METABOLIC PANEL WITH GFR
ALT: 10 U/L (ref 0–44)
AST: 17 U/L (ref 15–41)
Albumin: 3.9 g/dL (ref 3.5–5.0)
Alkaline Phosphatase: 59 U/L (ref 38–126)
Anion gap: 9 (ref 5–15)
BUN: 44 mg/dL — ABNORMAL HIGH (ref 8–23)
CO2: 27 mmol/L (ref 22–32)
Calcium: 9.8 mg/dL (ref 8.9–10.3)
Chloride: 104 mmol/L (ref 98–111)
Creatinine, Ser: 2.12 mg/dL — ABNORMAL HIGH (ref 0.44–1.00)
GFR, Estimated: 22 mL/min — ABNORMAL LOW
Glucose, Bld: 140 mg/dL — ABNORMAL HIGH (ref 70–99)
Potassium: 4.8 mmol/L (ref 3.5–5.1)
Sodium: 140 mmol/L (ref 135–145)
Total Bilirubin: 1 mg/dL (ref 0.3–1.2)
Total Protein: 8.2 g/dL — ABNORMAL HIGH (ref 6.5–8.1)

## 2021-02-04 LAB — CBC
HCT: 38.2 % (ref 36.0–46.0)
Hemoglobin: 11.7 g/dL — ABNORMAL LOW (ref 12.0–15.0)
MCH: 30.3 pg (ref 26.0–34.0)
MCHC: 30.6 g/dL (ref 30.0–36.0)
MCV: 99 fL (ref 80.0–100.0)
Platelets: 152 K/uL (ref 150–400)
RBC: 3.86 MIL/uL — ABNORMAL LOW (ref 3.87–5.11)
RDW: 12.8 % (ref 11.5–15.5)
WBC: 7.4 K/uL (ref 4.0–10.5)
nRBC: 0 % (ref 0.0–0.2)

## 2021-02-04 LAB — URINALYSIS, COMPLETE (UACMP) WITH MICROSCOPIC
Bacteria, UA: NONE SEEN
Bilirubin Urine: NEGATIVE
Glucose, UA: NEGATIVE mg/dL
Hgb urine dipstick: NEGATIVE
Ketones, ur: NEGATIVE mg/dL
Leukocytes,Ua: NEGATIVE
Nitrite: NEGATIVE
Protein, ur: NEGATIVE mg/dL
Specific Gravity, Urine: 1.01 (ref 1.005–1.030)
Squamous Epithelial / HPF: NONE SEEN (ref 0–5)
WBC, UA: NONE SEEN WBC/hpf (ref 0–5)
pH: 5 (ref 5.0–8.0)

## 2021-02-04 LAB — ETHANOL: Alcohol, Ethyl (B): <10 mg/dL

## 2021-02-04 LAB — URINE DRUG SCREEN, QUALITATIVE (ARMC ONLY)
Amphetamines, Ur Screen: NOT DETECTED
Barbiturates, Ur Screen: NOT DETECTED
Benzodiazepine, Ur Scrn: NOT DETECTED
Cannabinoid 50 Ng, Ur ~~LOC~~: NOT DETECTED
Cocaine Metabolite,Ur ~~LOC~~: NOT DETECTED
MDMA (Ecstasy)Ur Screen: NOT DETECTED
Methadone Scn, Ur: NOT DETECTED
Opiate, Ur Screen: NOT DETECTED
Phencyclidine (PCP) Ur S: NOT DETECTED
Tricyclic, Ur Screen: NOT DETECTED

## 2021-02-04 LAB — CBG MONITORING, ED: Glucose-Capillary: 138 mg/dL — ABNORMAL HIGH (ref 70–99)

## 2021-02-04 LAB — RESP PANEL BY RT-PCR (FLU A&B, COVID) ARPGX2
Influenza A by PCR: NEGATIVE
Influenza B by PCR: NEGATIVE
SARS Coronavirus 2 by RT PCR: NEGATIVE

## 2021-02-04 MED ORDER — DOCUSATE SODIUM 100 MG PO CAPS
100.0000 mg | ORAL_CAPSULE | Freq: Two times a day (BID) | ORAL | Status: DC
Start: 2021-02-05 — End: 2021-02-05
  Filled 2021-02-04: qty 1

## 2021-02-04 MED ORDER — ATORVASTATIN CALCIUM 20 MG PO TABS
10.0000 mg | ORAL_TABLET | Freq: Every day | ORAL | Status: DC
  Filled 2021-02-04: qty 1

## 2021-02-04 MED ORDER — ONDANSETRON 4 MG PO TBDP
4.0000 mg | ORAL_TABLET | Freq: Three times a day (TID) | ORAL | Status: DC | PRN
  Filled 2021-02-04: qty 1

## 2021-02-04 MED ORDER — PANTOPRAZOLE SODIUM 40 MG PO TBEC
40.0000 mg | DELAYED_RELEASE_TABLET | Freq: Every day | ORAL | Status: DC
  Filled 2021-02-04: qty 1

## 2021-02-04 NOTE — ED Notes (Signed)
To ct scan with er tech
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
## 2021-02-04 NOTE — ED Notes (Signed)
This rn spoke with grandduaghter via phone.  She states pt has become more aggressive past few days, trying to walk out the front door, unable to fold clothes which she does everyday, not eating or sleeping very much.
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
## 2021-02-04 NOTE — ED Notes (Signed)
Pt sleeping. 

## 2021-02-05 DIAGNOSIS — F322 Major depressive disorder, single episode, severe without psychotic features: Secondary | ICD-10-CM

## 2021-02-05 DIAGNOSIS — F29 Unspecified psychosis not due to a substance or known physiological condition: Secondary | ICD-10-CM | POA: Diagnosis not present

## 2021-02-05 LAB — HEMOGLOBIN A1C
Hgb A1c MFr Bld: 5.9 % — ABNORMAL HIGH (ref 4.8–5.6)
Mean Plasma Glucose: 122.63 mg/dL

## 2021-02-05 MED ORDER — AMLODIPINE BESYLATE 5 MG PO TABS
2.5000 mg | ORAL_TABLET | Freq: Every day | ORAL | Status: DC
  Administered 2021-02-05: 2.5 mg via ORAL
  Filled 2021-02-05: qty 1

## 2021-02-05 MED ORDER — GABAPENTIN 300 MG PO CAPS
300.0000 mg | ORAL_CAPSULE | Freq: Two times a day (BID) | ORAL | Status: DC
  Administered 2021-02-05: 300 mg via ORAL
  Filled 2021-02-05: qty 1

## 2021-02-05 MED ORDER — OLANZAPINE 2.5 MG PO TABS: 2.5000 mg | ORAL_TABLET | Freq: Every day | ORAL | 1 refills | Status: DC

## 2021-02-05 MED ORDER — PANTOPRAZOLE SODIUM 40 MG PO TBEC
40.0000 mg | DELAYED_RELEASE_TABLET | Freq: Every day | ORAL | Status: DC
  Administered 2021-02-05: 40 mg via ORAL
  Filled 2021-02-05: qty 1

## 2021-02-05 MED ORDER — OLANZAPINE 5 MG PO TABS: 2.5000 mg | ORAL_TABLET | Freq: Every day | ORAL | Status: DC

## 2021-02-05 MED ORDER — ATORVASTATIN CALCIUM 20 MG PO TABS
20.0000 mg | ORAL_TABLET | Freq: Every day | ORAL | Status: DC
  Administered 2021-02-05: 20 mg via ORAL
  Filled 2021-02-05: qty 1

## 2021-02-05 MED ORDER — CITALOPRAM HYDROBROMIDE 20 MG PO TABS
20.0000 mg | ORAL_TABLET | Freq: Every day | ORAL | Status: DC
  Administered 2021-02-05: 20 mg via ORAL
  Filled 2021-02-05: qty 1

## 2021-02-05 MED ORDER — CITALOPRAM HYDROBROMIDE 20 MG PO TABS: 20.0000 mg | ORAL_TABLET | Freq: Every day | ORAL | 1 refills | Status: DC

## 2021-02-05 MED ORDER — FUROSEMIDE 40 MG PO TABS
20.0000 mg | ORAL_TABLET | Freq: Every day | ORAL | Status: DC
  Administered 2021-02-05: 20 mg via ORAL
  Filled 2021-02-05: qty 1
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
## 2021-02-05 NOTE — ED Notes (Signed)
Pt changed and new linen provided.  Pt received and ate breakfast tray.

## 2021-02-05 NOTE — TOC Transition Note (Addendum)
Transition of Care [facility]) - CM/SW Discharge Note   Patient Details  Name: [name] [name] MRN: [id number] Date of Birth: [birth year]  Transition of Care [facility]) CM/SW Contact:  Ova Freshwater Phone Number:  [phone] 02/05/2021, 12:51 PM   Clinical Narrative:     Patient will d/c home with [facility], ETA 2:30PM.  Patient is psychiatrically cleared and does not meet inpatient criteria. EDP/ED Staff and Psychiatrists updated.  Patient's Patrick,[name] Mariann Barter (Granddaughter) [phone] on ETA.  CSW called [facility] and left voicemail requested return call.  Final next level of care: Home/Self Care Barriers to Discharge: ED No Barriers   Patient Goals and CMS Choice Patient states their goals for this hospitalization and ongoing recovery are:: Patient states she wants to live with her son.      Discharge Placement                       Discharge Plan and Services In-house Referral: Clinical Social Work                                   Social Determinants of Health (SDOH) Interventions     Readmission Risk Interventions No flowsheet data found.

## 2021-02-05 NOTE — BH Assessment (Signed)
02/05/2021 [name] [name]:[id number]  [name] [name] is a 87 y.o female who presents to [facility] ED voluntarily for paranoid delusions. During TTS assessment pt presents drowsy and oriented x 3. Pt believed the year is 62. Pt had normal psychomotor activity. The pt does not appear to be responding to internal or external stimuli. Pt's thought process were impaired as she reported delusions about being accused of sleeping with her granddaughter's boyfriend. Pt endorsed symptoms of paranoia, expressing suspicions of her granddaughter trying to kill her.  Pt identified her stressesors to be family content and being in her environment.Pt continued to perserverate about her delusional beliefs. Pt denies any current SI/HI/AH/VH.  [facility] from 02/04/2021 in [place] No Risk    The patient demonstrates the following risk factors for suicide: Chronic risk factors for suicide include: psychiatric disorder of MDD. Acute risk factors for suicide include: family or marital conflict. Protective factors for this patient include: hope for the future. Considering these factors, the overall suicide risk at this point appears to be low. Patient is not appropriate for outpatient follow up.  Chief Complaint:  Chief Complaint  Patient presents with  . Psychiatric Evaluation   Visit Diagnosis: Psychosis    CCA Screening, Triage and Referral (STR)  Patient Reported Information How did you hear about [place]? Self  Referral name: Self  Referral phone number: No data recorded  Whom do you see for routine medical problems? Primary Care  Practice/Facility Name: [facility]  Practice/Facility Phone Number: [phone]  Name of Contact: Beckie Salts, MD  Contact Number: No data recorded Contact Fax Number: No data recorded Prescriber Name: No data recorded Prescriber Address (if known): [address], [place], [place]  [address]   What Is the Reason for Your Visit/Call Today? Delusion  How Long Has This Been Causing You Problems? > than 6 months  What Do You Feel Would Help You the Most Today? -- (Pt unable to identify needs)   Have You Recently Been in Any Inpatient Treatment (Hospital/Detox/Crisis Center/28-Day Program)? No  Name/Location of Program/Hospital:No data recorded How Long Were You There? No data recorded When Were You Discharged? No data recorded  Have You Ever Received Services From [facility] Before? No  Who Do You See at [facility]? No data recorded  Have You Recently Had Any Thoughts About Hurting Yourself? No  Are You Planning to Commit Suicide/Harm Yourself At This time? No   Have you Recently Had Thoughts About [place]? No  Explanation: No data recorded  Have You Used Any Alcohol or Drugs in the Past 24 Hours? No  How Long Ago Did You Use Drugs or Alcohol? No data recorded What Did You Use and How Much? No data recorded  Do You Currently Have a Therapist/Psychiatrist? No  Name of Therapist/Psychiatrist: No data recorded  Have You Been Recently Discharged From Any Office Practice or Programs? No  Explanation of Discharge From Practice/Program: No data recorded    CCA Screening Triage Referral Assessment Type of Contact: Face-to-Face  Is this Initial or Reassessment? No data recorded Date Telepsych consult ordered in [facility]:  No data recorded Time Telepsych consult ordered in [facility]:  No data recorded  Patient Reported Information Reviewed? Yes  Patient Left Without Being Seen? No data recorded Reason for Not Completing Assessment: No data recorded  Collateral Involvement: None   Does Patient Have a Court Appointed Legal Guardian? No data recorded  Name and Contact of Legal Guardian: No data recorded If Minor and Not Living with Parent(s), Who has Custody? n/a  Is CPS involved or ever been involved? Never  Is APS involved or ever been involved?  Never   Patient Determined To Be At Risk for Harm To Self or Others Based on Review of Patient Reported Information or Presenting Complaint? No  Method: No data recorded Availability of Means: No data recorded Intent: No data recorded Notification Required: No data recorded Additional Information for Danger to Others Potential: No data recorded Additional Comments for Danger to Others Potential: No data recorded Are There Guns or Other Weapons in Your Home? No data recorded Types of Guns/Weapons: No data recorded Are These Weapons Safely Secured?                            No data recorded Who Could Verify You Are Able To Have These Secured: No data recorded Do You Have any Outstanding Charges, Pending Court Dates, Parole/Probation? No data recorded Contacted To Inform of Risk of Harm To Self or Others: No data recorded  Location of Assessment: [facility]   Does Patient Present under Involuntary Commitment? No  IVC Papers Initial File Date: No data recorded  [place] of Residence: [place]   Patient Currently Receiving the Following Services: Not Receiving Services   Determination of Need: Urgent (48 hours)   Options For Referral: -- (Pt will be referred to social work)     CCA Biopsychosocial Intake/Chief Complaint:  My granddaughter is trying to kill me  Current Symptoms/Problems: Pt has symptoms of paranoid delusions   Patient Reported Schizophrenia/Schizoaffective Diagnosis in Past: No   Strengths: Pt has stable housing in the foreseeable future  Preferences: Pt expressed a preference to go live with her son  Abilities: Pt able to ask for help   Type of Services Patient Feels are Needed: Pt reports needing housing   Initial Clinical Notes/Concerns: No data recorded  Mental Health Symptoms Depression:  None   Duration of Depressive symptoms: No data recorded  Mania:  None   Anxiety:   Worrying; Tension   Psychosis:  Delusions   Duration of Psychotic  symptoms: Greater than six months   Trauma:  None   Obsessions:  Cause anxiety; Poor insight; Recurrent & persistent thoughts/impulses/images   Compulsions:  None   Inattention:  None   Hyperactivity/Impulsivity:  N/A   Oppositional/Defiant Behaviors:  None   Emotional Irregularity:  No data recorded  Other Mood/Personality Symptoms:  No data recorded   Mental Status Exam Appearance and self-care  Stature:  Average   Weight:  Overweight   Clothing:  Casual   Grooming:  Normal   Cosmetic use:  None   Posture/gait:  Normal   Motor activity:  Not Remarkable   Sensorium  Attention:  Normal   Concentration:  Normal   Orientation:  Object; Person; Place   Recall/memory:  Defective in Short-term   Affect and Mood  Affect:  Anxious   Mood:  Dysphoric   Relating  Eye contact:  Normal   Facial expression:  Tense; Anxious   Attitude toward examiner:  Cooperative   Thought and Language  Speech flow: Normal   Thought content:  Delusions   Preoccupation:  Obsessions   Hallucinations:  None   Organization:  No data recorded  [employer] of Knowledge:  Fair   Intelligence:  Average   Abstraction:  Normal  Judgement:  Poor   Reality Testing:  Distorted   Insight:  None/zero insight   Decision Making:  Vacilates   Social Functioning  Social Maturity:  Impulsive   Social Judgement:  Heedless   Stress  Stressors:  Family conflict   Coping Ability:  Programme researcher, broadcasting/film/video Deficits:  Interpersonal   Supports:  Support needed     Exercise/Diet: Exercise/Diet Do You Follow a Special Diet?: No Do You Have Any Trouble Sleeping?: Yes Explanation of Sleeping Difficulties: Pt reports having a fear of sleeping due to worrying about being killed by her granddaughter.   CCA Employment/Education Employment/Work Situation: Employment / Work Situation Employment situation: On disability  CCA Family/Childhood History Family and  Relationship History: Family history Marital status: Widowed  Childhood History:     Child/Adolescent Assessment:     CCA Substance Use Alcohol/Drug Use: Alcohol / Drug Use Pain Medications: SEE PTA Prescriptions: SEE PTA Over the Counter: SEE PTA History of alcohol / drug use?: No history of alcohol / drug abuse Longest period of sobriety (when/how long): NA                 County of Residence: [place]   Patient Currently Receiving the Following Services: Not Receiving Services   Determination of Need: Urgent (48 hours)   Options For Referral: -- (Pt will be referred to social work)     CCA Biopsychosocial Intake/Chief Complaint:  My granddaughter is trying to kill me  Current Symptoms/Problems: Pt has symptoms of paranoid delusions   Patient Reported Schizophrenia/Schizoaffective Diagnosis in Past: No   Strengths: Pt has stable housing in the foreseeable future  Preferences: Pt expressed a preference to go live with her son   Kathi Ludwig, LCAS
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
## 2021-02-05 NOTE — ED Notes (Signed)
VOL/pending AM reassesment

## 2021-02-05 NOTE — ED Provider Notes (Signed)
Emergency Medicine Observation Re-evaluation Note  [name] [name] is a 87 y.o. female, seen on rounds today.  Pt initially presented to the ED for complaints of Psychiatric Evaluation Currently, the patient is resting comfortably.  Physical Exam  BP 114/70 (BP Location: Left Arm)   Pulse 76   Temp (!) 97.4 F (36.3 C) (Oral)   Resp 20   Wt 57.6 kg   SpO2 97%   BMI 17.71 kg/m  Physical Exam Gen: No acute distress  Resp: Normal rise and fall of chest Neuro: Moving all four extremities Psych: Resting currently, calm and cooperative when awake    ED Course / MDM  EKG:   I have reviewed the labs performed to date as well as medications administered while in observation.  Recent changes in the last 24 hours include no acute events overnight.  Plan  Current plan is for - 12:54 AM  Psych NP and TTS have evaluated patient.  They will have Dr. Weber Cooks evaluate patient in the morning for medication recommendations.  Also recommend social work consultation for possible ALF/SNF placement. Patient is not under full IVC at this time.   Joshaua Epple, Delice Bison, DO 02/05/21 [phone]
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
## 2021-02-05 NOTE — ED Notes (Signed)
Pt sleeping. 
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
## 2021-02-05 NOTE — ED Notes (Signed)
Pt given breakfast tray

## 2021-02-05 NOTE — ED Notes (Signed)
Pt with N/V/D. edp notified

## 2021-02-05 NOTE — ED Provider Notes (Deleted)
12:54 AM  Psych NP and TTS have evaluated patient.  They will have Dr. Weber Cooks evaluate patient in the morning for medication recommendations.  Also recommend social work consultation for possible ALF/SNF placement.   Dalvin Clipper, Delice Bison, DO 02/05/21 [phone]

## 2021-02-05 NOTE — ED Notes (Signed)
Pt had top denture piece. Tech placed uppers in denture cup at bedside.

## 2021-03-12 ENCOUNTER — Emergency Department (HOSPITAL_COMMUNITY)
Admission: EM | Admit: 2021-03-12 | Discharge: 2021-03-13 | Disposition: A | Discharge status: Home / Self Care | Payer: Medicare Other | Attending: Emergency Medicine | Admitting: Emergency Medicine

## 2021-03-12 DIAGNOSIS — E1122 Type 2 diabetes mellitus with diabetic chronic kidney disease: Secondary | ICD-10-CM | POA: Insufficient documentation

## 2021-03-12 DIAGNOSIS — N184 Chronic kidney disease, stage 4 (severe): Secondary | ICD-10-CM | POA: Insufficient documentation

## 2021-03-12 DIAGNOSIS — R42 Dizziness and giddiness: Secondary | ICD-10-CM | POA: Insufficient documentation

## 2021-03-12 DIAGNOSIS — R110 Nausea: Secondary | ICD-10-CM | POA: Insufficient documentation

## 2021-03-12 DIAGNOSIS — I129 Hypertensive chronic kidney disease with stage 1 through stage 4 chronic kidney disease, or unspecified chronic kidney disease: Secondary | ICD-10-CM | POA: Diagnosis not present

## 2021-03-12 DIAGNOSIS — H6123 Impacted cerumen, bilateral: Secondary | ICD-10-CM | POA: Insufficient documentation
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
## 2021-03-12 NOTE — ED Provider Notes (Signed)
Emergency Medicine Provider Triage Evaluation Note  [name] [name] , a 87 y.o. female  was evaluated in triage.  Pt complains of dizziness. Reports intermittent dizzy spells that last for several minutes at a time. Has had sxs for 2 weeks. Reports vertiginous sxs and near syncopal sxs. Had a fall the other day. States she has some pain to the left lower abd  Review of Systems  Positive: Dizziness, near syncope Negative: Chest pain, sob, nv  Physical Exam  BP 116/65 (BP Location: Right Arm)   Pulse 88   Temp 98.2 F (36.8 C) (Oral)   Resp 16   SpO2 95%  Gen:   Awake, no distress   Resp:  Normal effort  MSK:   Moves extremities without difficulty  Other:  llq ttp, lungs ctab  Medical Decision Making  Medically screening exam initiated at 11:30 PM.  Appropriate orders placed.  Persayis Wead was informed that the remainder of the evaluation will be completed by another provider, this initial triage assessment does not replace that evaluation, and the importance of remaining in the ED until their evaluation is complete.     Rodney Booze, PA-C 03/12/21 2335    Lennice Sites, DO 03/13/21 1541

## 2021-03-13 ENCOUNTER — Emergency Department (HOSPITAL_COMMUNITY): Payer: Medicare Other

## 2021-03-13 DIAGNOSIS — R42 Dizziness and giddiness: Secondary | ICD-10-CM | POA: Diagnosis not present

## 2021-03-13 LAB — COMPREHENSIVE METABOLIC PANEL
ALT: 14 U/L (ref 0–44)
AST: 20 U/L (ref 15–41)
Albumin: 3.4 g/dL — ABNORMAL LOW (ref 3.5–5.0)
Alkaline Phosphatase: 52 U/L (ref 38–126)
Anion gap: 9 (ref 5–15)
BUN: 57 mg/dL — ABNORMAL HIGH (ref 8–23)
CO2: 23 mmol/L (ref 22–32)
Calcium: 9.3 mg/dL (ref 8.9–10.3)
Chloride: 102 mmol/L (ref 98–111)
Creatinine, Ser: 2.87 mg/dL — ABNORMAL HIGH (ref 0.44–1.00)
GFR, Estimated: 15 mL/min — ABNORMAL LOW (ref >60)
Glucose, Bld: 208 mg/dL — ABNORMAL HIGH (ref 70–99)
Potassium: 4.9 mmol/L (ref 3.5–5.1)
Sodium: 134 mmol/L — ABNORMAL LOW (ref 135–145)
Total Bilirubin: 0.8 mg/dL (ref 0.3–1.2)
Total Protein: 7.6 g/dL (ref 6.5–8.1)

## 2021-03-13 LAB — CBC WITH DIFFERENTIAL/PLATELET
Abs Immature Granulocytes: 0.03 10*3/uL (ref 0.00–0.07)
Basophils Absolute: 0 10*3/uL (ref 0.0–0.1)
Basophils Relative: 0 %
Eosinophils Absolute: 0.3 10*3/uL (ref 0.0–0.5)
Eosinophils Relative: 4 %
HCT: 36.3 % (ref 36.0–46.0)
Hemoglobin: 11.1 g/dL — ABNORMAL LOW (ref 12.0–15.0)
Immature Granulocytes: 0 %
Lymphocytes Relative: 18 %
Lymphs Abs: 1.4 10*3/uL (ref 0.7–4.0)
MCH: 30 pg (ref 26.0–34.0)
MCHC: 30.6 g/dL (ref 30.0–36.0)
MCV: 98.1 fL (ref 80.0–100.0)
Monocytes Absolute: 0.8 10*3/uL (ref 0.1–1.0)
Monocytes Relative: 10 %
Neutro Abs: 5.2 10*3/uL (ref 1.7–7.7)
Neutrophils Relative %: 68 %
Platelets: 145 10*3/uL — ABNORMAL LOW (ref 150–400)
RBC: 3.7 MIL/uL — ABNORMAL LOW (ref 3.87–5.11)
RDW: 13.1 % (ref 11.5–15.5)
WBC: 7.8 10*3/uL (ref 4.0–10.5)
nRBC: 0 % (ref 0.0–0.2)

## 2021-03-13 NOTE — ED Notes (Signed)
Attempted to call granddaughter to come pick up pt. Voicemail was left.Marland Kitchen
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
## 2021-04-05 ENCOUNTER — Other Ambulatory Visit: Payer: Self-pay | Admitting: Psychiatry

## 2021-06-13 DIAGNOSIS — K219 Gastro-esophageal reflux disease without esophagitis: Secondary | ICD-10-CM | POA: Insufficient documentation

## 2021-06-18 ENCOUNTER — Emergency Department: Payer: Medicare Other

## 2021-06-18 ENCOUNTER — Inpatient Hospital Stay
Admission: EM | Admit: 2021-06-18 | Discharge: 2021-07-02 | DRG: 312 | Disposition: A | Discharge status: Skilled Nursing Facility | Payer: Medicare Other | Attending: Obstetrics and Gynecology | Admitting: Obstetrics and Gynecology

## 2021-06-18 DIAGNOSIS — N184 Chronic kidney disease, stage 4 (severe): Secondary | ICD-10-CM | POA: Diagnosis present

## 2021-06-18 DIAGNOSIS — R55 Syncope and collapse: Principal | ICD-10-CM | POA: Diagnosis present

## 2021-06-18 DIAGNOSIS — Z9049 Acquired absence of other specified parts of digestive tract: Secondary | ICD-10-CM

## 2021-06-18 DIAGNOSIS — Z20822 Contact with and (suspected) exposure to covid-19: Secondary | ICD-10-CM | POA: Diagnosis present

## 2021-06-18 DIAGNOSIS — Z9109 Other allergy status, other than to drugs and biological substances: Secondary | ICD-10-CM

## 2021-06-18 DIAGNOSIS — E785 Hyperlipidemia, unspecified: Secondary | ICD-10-CM | POA: Diagnosis present

## 2021-06-18 DIAGNOSIS — R778 Other specified abnormalities of plasma proteins: Secondary | ICD-10-CM | POA: Diagnosis present

## 2021-06-18 DIAGNOSIS — G9341 Metabolic encephalopathy: Secondary | ICD-10-CM

## 2021-06-18 DIAGNOSIS — E1165 Type 2 diabetes mellitus with hyperglycemia: Secondary | ICD-10-CM

## 2021-06-18 DIAGNOSIS — R9431 Abnormal electrocardiogram [ECG] [EKG]: Secondary | ICD-10-CM

## 2021-06-18 DIAGNOSIS — F323 Major depressive disorder, single episode, severe with psychotic features: Secondary | ICD-10-CM

## 2021-06-18 DIAGNOSIS — Y92000 Kitchen of unspecified non-institutional (private) residence as  the place of occurrence of the external cause: Secondary | ICD-10-CM

## 2021-06-18 DIAGNOSIS — I130 Hypertensive heart and chronic kidney disease with heart failure and stage 1 through stage 4 chronic kidney disease, or unspecified chronic kidney disease: Secondary | ICD-10-CM | POA: Diagnosis present

## 2021-06-18 DIAGNOSIS — W19XXXA Unspecified fall, initial encounter: Secondary | ICD-10-CM | POA: Diagnosis present

## 2021-06-18 DIAGNOSIS — F0390 Unspecified dementia without behavioral disturbance: Secondary | ICD-10-CM | POA: Diagnosis present

## 2021-06-18 DIAGNOSIS — I82409 Acute embolism and thrombosis of unspecified deep veins of unspecified lower extremity: Secondary | ICD-10-CM

## 2021-06-18 DIAGNOSIS — E1122 Type 2 diabetes mellitus with diabetic chronic kidney disease: Secondary | ICD-10-CM | POA: Diagnosis present

## 2021-06-18 DIAGNOSIS — I5032 Chronic diastolic (congestive) heart failure: Secondary | ICD-10-CM | POA: Diagnosis present

## 2021-06-18 DIAGNOSIS — E11649 Type 2 diabetes mellitus with hypoglycemia without coma: Secondary | ICD-10-CM | POA: Diagnosis not present

## 2021-06-18 DIAGNOSIS — I10 Essential (primary) hypertension: Secondary | ICD-10-CM | POA: Diagnosis present

## 2021-06-18 DIAGNOSIS — R600 Localized edema: Secondary | ICD-10-CM

## 2021-06-18 DIAGNOSIS — I351 Nonrheumatic aortic (valve) insufficiency: Secondary | ICD-10-CM | POA: Diagnosis present

## 2021-06-18 DIAGNOSIS — Z794 Long term (current) use of insulin: Secondary | ICD-10-CM

## 2021-06-18 LAB — URINALYSIS, COMPLETE (UACMP) WITH MICROSCOPIC
Bacteria, UA: NONE SEEN
Bilirubin Urine: NEGATIVE
Glucose, UA: >=500 mg/dL — AB
Hgb urine dipstick: NEGATIVE
Ketones, ur: 80 mg/dL — AB
Leukocytes,Ua: NEGATIVE
Nitrite: NEGATIVE
Protein, ur: NEGATIVE mg/dL
Specific Gravity, Urine: 1.021 (ref 1.005–1.030)
Squamous Epithelial / HPF: NONE SEEN (ref 0–5)
WBC, UA: NONE SEEN WBC/hpf (ref 0–5)
pH: 5 (ref 5.0–8.0)

## 2021-06-18 LAB — CBC WITH DIFFERENTIAL/PLATELET
Abs Immature Granulocytes: 0.05 10*3/uL (ref 0.00–0.07)
Basophils Absolute: 0 10*3/uL (ref 0.0–0.1)
Basophils Relative: 0 %
Eosinophils Absolute: 0.1 10*3/uL (ref 0.0–0.5)
Eosinophils Relative: 1 %
HCT: 39.4 % (ref 36.0–46.0)
Hemoglobin: 12.6 g/dL (ref 12.0–15.0)
Immature Granulocytes: 0 %
Lymphocytes Relative: 12 %
Lymphs Abs: 1.5 10*3/uL (ref 0.7–4.0)
MCH: 31.5 pg (ref 26.0–34.0)
MCHC: 32 g/dL (ref 30.0–36.0)
MCV: 98.5 fL (ref 80.0–100.0)
Monocytes Absolute: 1 10*3/uL (ref 0.1–1.0)
Monocytes Relative: 8 %
Neutro Abs: 9.2 10*3/uL — ABNORMAL HIGH (ref 1.7–7.7)
Neutrophils Relative %: 79 %
Platelets: 189 10*3/uL (ref 150–400)
RBC: 4 MIL/uL (ref 3.87–5.11)
RDW: 13.5 % (ref 11.5–15.5)
Smear Review: ADEQUATE
WBC: 11.7 10*3/uL — ABNORMAL HIGH (ref 4.0–10.5)
nRBC: 0 % (ref 0.0–0.2)

## 2021-06-18 LAB — COMPREHENSIVE METABOLIC PANEL
ALT: 12 U/L (ref 0–44)
AST: 29 U/L (ref 15–41)
Albumin: 4.1 g/dL (ref 3.5–5.0)
Alkaline Phosphatase: 63 U/L (ref 38–126)
Anion gap: 11 (ref 5–15)
BUN: 50 mg/dL — ABNORMAL HIGH (ref 8–23)
CO2: 26 mmol/L (ref 22–32)
Calcium: 9.5 mg/dL (ref 8.9–10.3)
Chloride: 103 mmol/L (ref 98–111)
Creatinine, Ser: 2.61 mg/dL — ABNORMAL HIGH (ref 0.44–1.00)
GFR, Estimated: 17 mL/min — ABNORMAL LOW (ref >60)
Glucose, Bld: 231 mg/dL — ABNORMAL HIGH (ref 70–99)
Potassium: 4.5 mmol/L (ref 3.5–5.1)
Sodium: 140 mmol/L (ref 135–145)
Total Bilirubin: 1.5 mg/dL — ABNORMAL HIGH (ref 0.3–1.2)
Total Protein: 8.3 g/dL — ABNORMAL HIGH (ref 6.5–8.1)

## 2021-06-18 LAB — TROPONIN I (HIGH SENSITIVITY): Troponin I (High Sensitivity): 22 ng/L — ABNORMAL HIGH (ref <18)

## 2021-06-18 LAB — MAGNESIUM: Magnesium: 2 mg/dL (ref 1.7–2.4)

## 2021-06-18 LAB — RESP PANEL BY RT-PCR (FLU A&B, COVID) ARPGX2
Influenza A by PCR: NEGATIVE
Influenza B by PCR: NEGATIVE
SARS Coronavirus 2 by RT PCR: NEGATIVE

## 2021-06-18 LAB — D-DIMER, QUANTITATIVE: D-Dimer, Quant: 2.76 ug/mL-FEU — ABNORMAL HIGH (ref 0.00–0.50)

## 2021-06-18 MED ORDER — OLANZAPINE 5 MG PO TBDP
5.0000 mg | ORAL_TABLET | Freq: Once | ORAL | Status: AC
  Administered 2021-06-18: 5 mg via ORAL
  Filled 2021-06-18: qty 1

## 2021-06-18 MED ORDER — LACTATED RINGERS IV BOLUS
1000.0000 mL | Freq: Once | INTRAVENOUS | Status: AC
  Administered 2021-06-18: 1000 mL via INTRAVENOUS

## 2021-06-18 NOTE — ED Triage Notes (Signed)
Pt is from home , where daughter states she has been altered than normal . Ems was called for a syncopal episode , ems states she she was unresponsive when they got there   Pt appears stable , vitals as noted in chart .   ABCs appear WNL

## 2021-06-18 NOTE — ED Provider Notes (Signed)
Received a call from CT that patient's GFR<30 and CTA had been ordered by Dr. Tamala Julian due to syncope and elevated d-dimer. CTA cancelled and VQ scan ordered instead. Will consult hospitalist for admission.   Alfred Levins, [place], MD 06/18/21 2329
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
## 2021-06-19 ENCOUNTER — Inpatient Hospital Stay: Payer: Medicare Other

## 2021-06-19 ENCOUNTER — Other Ambulatory Visit: Payer: Self-pay

## 2021-06-19 DIAGNOSIS — R9431 Abnormal electrocardiogram [ECG] [EKG]: Secondary | ICD-10-CM

## 2021-06-19 DIAGNOSIS — R55 Syncope and collapse: Secondary | ICD-10-CM | POA: Diagnosis present

## 2021-06-19 DIAGNOSIS — Z794 Long term (current) use of insulin: Secondary | ICD-10-CM | POA: Diagnosis not present

## 2021-06-19 DIAGNOSIS — F0390 Unspecified dementia without behavioral disturbance: Secondary | ICD-10-CM | POA: Diagnosis present

## 2021-06-19 DIAGNOSIS — Y92000 Kitchen of unspecified non-institutional (private) residence as  the place of occurrence of the external cause: Secondary | ICD-10-CM | POA: Diagnosis not present

## 2021-06-19 DIAGNOSIS — Z9109 Other allergy status, other than to drugs and biological substances: Secondary | ICD-10-CM | POA: Diagnosis not present

## 2021-06-19 DIAGNOSIS — G9341 Metabolic encephalopathy: Secondary | ICD-10-CM

## 2021-06-19 DIAGNOSIS — E1165 Type 2 diabetes mellitus with hyperglycemia: Secondary | ICD-10-CM

## 2021-06-19 DIAGNOSIS — W19XXXA Unspecified fall, initial encounter: Secondary | ICD-10-CM | POA: Diagnosis present

## 2021-06-19 DIAGNOSIS — E785 Hyperlipidemia, unspecified: Secondary | ICD-10-CM | POA: Diagnosis present

## 2021-06-19 DIAGNOSIS — E11649 Type 2 diabetes mellitus with hypoglycemia without coma: Secondary | ICD-10-CM | POA: Diagnosis not present

## 2021-06-19 DIAGNOSIS — I351 Nonrheumatic aortic (valve) insufficiency: Secondary | ICD-10-CM | POA: Diagnosis present

## 2021-06-19 DIAGNOSIS — N184 Chronic kidney disease, stage 4 (severe): Secondary | ICD-10-CM | POA: Diagnosis present

## 2021-06-19 DIAGNOSIS — Z9049 Acquired absence of other specified parts of digestive tract: Secondary | ICD-10-CM | POA: Diagnosis not present

## 2021-06-19 DIAGNOSIS — I5032 Chronic diastolic (congestive) heart failure: Secondary | ICD-10-CM | POA: Diagnosis present

## 2021-06-19 DIAGNOSIS — F323 Major depressive disorder, single episode, severe with psychotic features: Secondary | ICD-10-CM

## 2021-06-19 DIAGNOSIS — R778 Other specified abnormalities of plasma proteins: Secondary | ICD-10-CM | POA: Diagnosis present

## 2021-06-19 DIAGNOSIS — E1122 Type 2 diabetes mellitus with diabetic chronic kidney disease: Secondary | ICD-10-CM | POA: Diagnosis present

## 2021-06-19 DIAGNOSIS — I130 Hypertensive heart and chronic kidney disease with heart failure and stage 1 through stage 4 chronic kidney disease, or unspecified chronic kidney disease: Secondary | ICD-10-CM | POA: Diagnosis present

## 2021-06-19 DIAGNOSIS — Z20822 Contact with and (suspected) exposure to covid-19: Secondary | ICD-10-CM | POA: Diagnosis present

## 2021-06-19 LAB — HEMOGLOBIN A1C
Hgb A1c MFr Bld: 5.6 % (ref 4.8–5.6)
Mean Plasma Glucose: 114.02 mg/dL

## 2021-06-19 LAB — GLUCOSE, CAPILLARY
Glucose-Capillary: 136 mg/dL — ABNORMAL HIGH (ref 70–99)
Glucose-Capillary: 169 mg/dL — ABNORMAL HIGH (ref 70–99)
Glucose-Capillary: 111 mg/dL — ABNORMAL HIGH (ref 70–99)

## 2021-06-19 LAB — CBG MONITORING, ED
Glucose-Capillary: 96 mg/dL (ref 70–99)
Glucose-Capillary: 93 mg/dL (ref 70–99)

## 2021-06-19 LAB — TROPONIN I (HIGH SENSITIVITY): Troponin I (High Sensitivity): 18 ng/L — ABNORMAL HIGH (ref <18)

## 2021-06-19 MED ORDER — TRAZODONE HCL 50 MG PO TABS
50.0000 mg | ORAL_TABLET | Freq: Every evening | ORAL | Status: DC | PRN
  Administered 2021-06-22 – 2021-07-01 (×7): 50 mg via ORAL
  Filled 2021-06-19 (×9): qty 1

## 2021-06-19 MED ORDER — ACETAMINOPHEN 325 MG PO TABS: 650.0000 mg | ORAL_TABLET | Freq: Four times a day (QID) | ORAL | Status: DC | PRN

## 2021-06-19 MED ORDER — PANTOPRAZOLE SODIUM 40 MG PO TBEC
40.0000 mg | DELAYED_RELEASE_TABLET | Freq: Every day | ORAL | Status: DC
  Administered 2021-06-19 – 2021-07-02 (×14): 40 mg via ORAL
  Filled 2021-06-19 (×14): qty 1

## 2021-06-19 MED ORDER — CITALOPRAM HYDROBROMIDE 20 MG PO TABS: 20.0000 mg | ORAL_TABLET | Freq: Every day | ORAL | Status: DC

## 2021-06-19 MED ORDER — AMLODIPINE BESYLATE 5 MG PO TABS
2.5000 mg | ORAL_TABLET | Freq: Every day | ORAL | Status: DC
  Administered 2021-06-19 – 2021-06-26 (×8): 2.5 mg via ORAL
  Filled 2021-06-19 (×8): qty 1

## 2021-06-19 MED ORDER — FUROSEMIDE 20 MG PO TABS
20.0000 mg | ORAL_TABLET | Freq: Every day | ORAL | Status: DC
  Administered 2021-06-19 – 2021-07-02 (×14): 20 mg via ORAL
  Filled 2021-06-19 (×14): qty 1

## 2021-06-19 MED ORDER — TECHNETIUM TO 99M ALBUMIN AGGREGATED
4.0000 | Freq: Once | INTRAVENOUS | Status: AC | PRN
  Administered 2021-06-19: 4.36 via INTRAVENOUS

## 2021-06-19 MED ORDER — VITAMIN D (ERGOCALCIFEROL) 1.25 MG (50000 UNIT) PO CAPS
50000.0000 [IU] | ORAL_CAPSULE | ORAL | Status: DC
  Administered 2021-06-21 – 2021-06-28 (×2): 50000 [IU] via ORAL
  Filled 2021-06-19 (×2): qty 1

## 2021-06-19 MED ORDER — HYDROCODONE-ACETAMINOPHEN 5-325 MG PO TABS: 1.0000 | ORAL_TABLET | ORAL | Status: DC | PRN

## 2021-06-19 MED ORDER — INSULIN ASPART 100 UNIT/ML IJ SOLN: 0.0000 [IU] | Freq: Every day | INTRAMUSCULAR | Status: DC

## 2021-06-19 MED ORDER — OLANZAPINE 2.5 MG PO TABS
2.5000 mg | ORAL_TABLET | Freq: Every day | ORAL | Status: DC
  Administered 2021-06-20 – 2021-07-01 (×12): 2.5 mg via ORAL
  Filled 2021-06-19 (×14): qty 1

## 2021-06-19 MED ORDER — ACETAMINOPHEN 650 MG RE SUPP: 650.0000 mg | Freq: Four times a day (QID) | RECTAL | Status: DC | PRN

## 2021-06-19 MED ORDER — SODIUM CHLORIDE 0.9 % IV SOLN: INTRAVENOUS | Status: DC

## 2021-06-19 MED ORDER — SODIUM CHLORIDE 0.9% FLUSH
3.0000 mL | Freq: Two times a day (BID) | INTRAVENOUS | Status: DC
  Administered 2021-06-19 – 2021-06-28 (×18): 3 mL via INTRAVENOUS

## 2021-06-19 MED ORDER — TRAZODONE HCL 50 MG PO TABS: 50.0000 mg | ORAL_TABLET | Freq: Every evening | ORAL | Status: DC | PRN

## 2021-06-19 MED ORDER — ATORVASTATIN CALCIUM 20 MG PO TABS
20.0000 mg | ORAL_TABLET | Freq: Every day | ORAL | Status: DC
  Administered 2021-06-19 – 2021-07-02 (×14): 20 mg via ORAL
  Filled 2021-06-19 (×14): qty 1

## 2021-06-19 MED ORDER — INSULIN ASPART 100 UNIT/ML IJ SOLN
0.0000 [IU] | Freq: Three times a day (TID) | INTRAMUSCULAR | Status: DC
Start: 2021-06-19 — End: 2021-06-26
  Administered 2021-06-19: 2 [IU] via SUBCUTANEOUS
  Administered 2021-06-19: 3 [IU] via SUBCUTANEOUS
  Administered 2021-06-20 (×2): 2 [IU] via SUBCUTANEOUS
  Administered 2021-06-21: 3 [IU] via SUBCUTANEOUS
  Administered 2021-06-21 – 2021-06-23 (×5): 2 [IU] via SUBCUTANEOUS
  Administered 2021-06-24: 3 [IU] via SUBCUTANEOUS
  Administered 2021-06-24 – 2021-06-26 (×3): 2 [IU] via SUBCUTANEOUS
  Administered 2021-06-26: 3 [IU] via SUBCUTANEOUS
  Filled 2021-06-19 (×14): qty 1

## 2021-06-19 MED ORDER — PREGABALIN 75 MG PO CAPS: 75.0000 mg | ORAL_CAPSULE | Freq: Two times a day (BID) | ORAL | Status: DC

## 2021-06-19 MED ORDER — OLANZAPINE 2.5 MG PO TABS: 2.5000 mg | ORAL_TABLET | Freq: Every day | ORAL | Status: DC
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
## 2021-06-19 NOTE — Progress Notes (Signed)
PT Cancellation Note  Patient Details Name: Jedidiah Nutt MRN: [id number] DOB: 12-14-1929   Cancelled Treatment:    Reason Eval/Treat Not Completed: Other (comment).  PT consult received.  Chart reviewed.  Pt currently pending V/Q scan to r/o PE.  D/t this, will hold PT at this time and re-attempt PT evaluation at a later date/time once results of V/Q scan is known and pt is medically appropriate to participate in therapy.  Leitha Bleak, PT 06/19/21, 8:49 AM
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
## 2021-06-19 NOTE — Progress Notes (Signed)
Brief hospitalist update note.  This is a nonbillable note.  Please see same-day H&P from Dr. Damita Dunnings for full billable details.  Briefly, this is an 87 year old female with extensive past medical history and psychiatric history recently treated in the ED for psychosis in March 2022 was brought to the ED following a possible syncopal event with collapse.  Also per documentation the patient has been exhibiting signs of either dementia, delirium or psychosis.  Granddaughter apparently unable to care for her.  I am unable to reach the granddaughter via phone.  On admission patient had a D-dimer done for unclear reasons.  This came back positive and followed by VQ scan.  Unfortunately VQ scan intermediate probability for PE.  At this time patient is hemodynamically stable.  Blood pressure and heart rate within normal range.  Patient is on room air and has normal work of breathing.  Will not anticoagulate.  We will pursue bilateral lower extremity Dopplers and an echocardiogram for better characterization of possible underlying VTE.  Therapy and TOC has been consulted.  Patient will require placement.  Ralene Muskrat MD  No charge

## 2021-06-19 NOTE — Progress Notes (Signed)
PT Cancellation Note  Patient Details Name: [name] [name] MRN: [id number] DOB: October 09, 1930   Cancelled Treatment:    Reason Eval/Treat Not Completed: Patient at procedure or test/unavailable. Chart reviewed, MD consulted. Pt off floor for DVT study upon arrival. Will attempt evaluation again at later date/time.   5:37 PM, 06/19/21 Etta Grandchild, PT, DPT Physical Therapist - [facility]  [phone] ([place])    [place] C 06/19/2021, 5:36 PM

## 2021-06-20 ENCOUNTER — Inpatient Hospital Stay
Admit: 2021-06-20 | Discharge: 2021-06-20 | Disposition: A | Discharge status: Home / Self Care | Payer: Medicare Other | Attending: Internal Medicine | Admitting: Internal Medicine

## 2021-06-20 DIAGNOSIS — R55 Syncope and collapse: Secondary | ICD-10-CM | POA: Diagnosis not present

## 2021-06-20 LAB — ECHOCARDIOGRAM COMPLETE
AR max vel: 2.26 cm2
AV Area VTI: 1.75 cm2
AV Area mean vel: 1.88 cm2
AV Mean grad: 4 mmHg
AV Peak grad: 6.1 mmHg
Ao pk vel: 1.23 m/s
Area-P 1/2: 8.82 cm2
MV VTI: 1.87 cm2
S' Lateral: 2.83 cm

## 2021-06-20 LAB — GLUCOSE, CAPILLARY
Glucose-Capillary: 99 mg/dL (ref 70–99)
Glucose-Capillary: 134 mg/dL — ABNORMAL HIGH (ref 70–99)
Glucose-Capillary: 146 mg/dL — ABNORMAL HIGH (ref 70–99)
Glucose-Capillary: 129 mg/dL — ABNORMAL HIGH (ref 70–99)

## 2021-06-20 MED ORDER — CITALOPRAM HYDROBROMIDE 20 MG PO TABS
20.0000 mg | ORAL_TABLET | Freq: Every day | ORAL | Status: DC
Start: 2021-06-21 — End: 2021-07-02
  Administered 2021-06-21 – 2021-07-02 (×12): 20 mg via ORAL
  Filled 2021-06-20 (×12): qty 1
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
## 2021-06-20 NOTE — TOC Progression Note (Signed)
Transition of Care [facility]) - Progression Note    Patient Details  Name: [name] [name] MRN: [id number] Date of Birth: [birth year]  [facility]) CM/SW Contact  Beverly Sessions, RN Phone Number: 06/20/2021, 2:16 PM  Clinical Narrative:         Per PT patient unable to follow commands for therapy In anticipation of needing LTC placement fl2 sent for signature, and bed search initiated   Expected Discharge Plan and Services                                                 Social Determinants of Health (SDOH) Interventions    Readmission Risk Interventions No flowsheet data found.

## 2021-06-20 NOTE — Progress Notes (Signed)
Mobility Specialist - Progress Note   06/20/21 1600  Mobility  Activity Ambulated in hall  Level of Assistance Minimal assist, patient does 75% or more  Assistive Device Front wheel walker  Distance Ambulated (ft) 130 ft  Mobility Ambulated with assistance in hallway  Mobility Response Tolerated well  Mobility performed by Mobility specialist  $Mobility charge 1 Mobility    Pre-mobility: 139/70 BP Post-mobility: 93 HR, 124/83 BP, 96% SpO2   Pt lying in bed upon arrival, utilizing RA. Extra time to sit EOB, no dizziness. MinA to stand and ambulate into hallway. VC to keep RW close to body. Pt voices back pain with ambulation, limiting further distance. Denied SOB. Pt returned to room, took several steps along bedside to reach [facility]. Returned supine with minA for LE support. Pt left in bed with needs in reach, alarm set. Aox3.   Kathee Delton Mobility Specialist 06/20/21, 5:01 PM

## 2021-06-20 NOTE — Progress Notes (Signed)
Pt removed IV. 2 unsuccessful attempts by this nurse to obtain new access. IV consult has been made

## 2021-06-20 NOTE — TOC Progression Note (Signed)
Transition of Care [facility]) - Progression Note    Patient Details  Name: [name] [name] MRN: [id number] Date of Birth: October 06, 1930  Transition of Care [facility]) CM/SW Contact  Beverly Sessions, RN Phone Number: 06/20/2021, 8:55 AM  Clinical Narrative:          Received return call from granddaughter yesterday at 8:14 pm no voicemail was left.  Attempted to call granddaughter back this morning. Voicemail left  PT pending   Expected Discharge Plan and Services                                                 Social Determinants of Health (SDOH) Interventions    Readmission Risk Interventions No flowsheet data found.
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
## 2021-06-20 NOTE — Plan of Care (Signed)
  Problem: Clinical Measurements: Goal: Ability to maintain clinical measurements within normal limits will improve Outcome: Progressing Goal: Will remain free from infection Outcome: Progressing Goal: Diagnostic test results will improve Outcome: Progressing Goal: Respiratory complications will improve Outcome: Progressing Goal: Cardiovascular complication will be avoided Outcome: Progressing   Pt alert and oriented to person only. Gets irritated easily, refused her meds last night and blood sugar check this morning. V/S stable. Denies any pain at this time.

## 2021-06-20 NOTE — Progress Notes (Signed)
Pt refused blood sugar check this morning. 

## 2021-06-20 NOTE — TOC Progression Note (Signed)
Transition of Care [facility]) - Progression Note    Patient Details  Name: [name] [name] MRN: [id number] Date of Birth: [birth year]  [facility] CM/SW Contact  Beverly Sessions, RN Phone Number: 06/20/2021, 4:25 PM  Clinical Narrative:      Vm left for granddaughter to discuss discharge disposition        Expected Discharge Plan and Services                                                 Social Determinants of Health (SDOH) Interventions    Readmission Risk Interventions No flowsheet data found.

## 2021-06-20 NOTE — Progress Notes (Signed)
*  PRELIMINARY RESULTS* Echocardiogram 2D Echocardiogram has been performed.  [name] [name] 06/20/2021, 11:50 AM

## 2021-06-21 DIAGNOSIS — R55 Syncope and collapse: Secondary | ICD-10-CM | POA: Diagnosis not present

## 2021-06-21 LAB — GLUCOSE, CAPILLARY
Glucose-Capillary: 135 mg/dL — ABNORMAL HIGH (ref 70–99)
Glucose-Capillary: 155 mg/dL — ABNORMAL HIGH (ref 70–99)
Glucose-Capillary: 115 mg/dL — ABNORMAL HIGH (ref 70–99)

## 2021-06-21 MED ORDER — ENOXAPARIN SODIUM 30 MG/0.3ML IJ SOSY
30.0000 mg | PREFILLED_SYRINGE | INTRAMUSCULAR | Status: DC
  Administered 2021-06-21 – 2021-06-25 (×5): 30 mg via SUBCUTANEOUS
  Filled 2021-06-21 (×5): qty 0.3
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
## 2021-06-21 NOTE — Progress Notes (Signed)
Mobility Specialist - Progress Note   06/21/21 1700  Mobility  Activity Refused mobility  Mobility performed by Mobility specialist    2nd attempt this date. Pt politely refused both sessions, initially wanting to rest on first attempt and then eating supper upon 2nd attempt. Will attempt session another date.    Kathee Delton Mobility Specialist 06/21/21, 5:17 PM

## 2021-06-21 NOTE — TOC Progression Note (Signed)
Transition of Care [facility]) - Progression Note    Patient Details  Name: [name] [name] MRN: [id number] Date of Birth: 1930/03/30  Transition of [facility] CM/SW Contact  Beverly Sessions, RN Phone Number: 06/21/2021, 2:33 PM  Clinical Narrative:     Received return call from granddaughter She confirms that patient can not return at discharge and wishes for LTC placement No bed offers at this time  Granddaughter agreeable for Danielle with [employer] to call her to assist with LTC placement         Expected Discharge Plan and Services                                                 Social Determinants of Health (SDOH) Interventions    Readmission Risk Interventions No flowsheet data found.
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
## 2021-06-21 NOTE — TOC Progression Note (Signed)
Transition of Care [facility]) - Progression Note    Patient Details  Name: Marlette Njoku MRN: [id number] Date of Birth: [birth year]  [facility] CM/SW Contact  Beverly Sessions, RN Phone Number: 06/21/2021, 8:52 AM  Clinical Narrative:     No LTC bed offers  No return call from family       Expected Discharge Plan and Services                                                 Social Determinants of Health (SDOH) Interventions    Readmission Risk Interventions No flowsheet data found.

## 2021-06-22 DIAGNOSIS — R55 Syncope and collapse: Secondary | ICD-10-CM | POA: Diagnosis not present

## 2021-06-22 LAB — GLUCOSE, CAPILLARY
Glucose-Capillary: 131 mg/dL — ABNORMAL HIGH (ref 70–99)
Glucose-Capillary: 116 mg/dL — ABNORMAL HIGH (ref 70–99)
Glucose-Capillary: 108 mg/dL — ABNORMAL HIGH (ref 70–99)
Glucose-Capillary: 113 mg/dL — ABNORMAL HIGH (ref 70–99)
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
## 2021-06-23 DIAGNOSIS — R55 Syncope and collapse: Secondary | ICD-10-CM | POA: Diagnosis not present

## 2021-06-23 LAB — GLUCOSE, CAPILLARY
Glucose-Capillary: 128 mg/dL — ABNORMAL HIGH (ref 70–99)
Glucose-Capillary: 128 mg/dL — ABNORMAL HIGH (ref 70–99)
Glucose-Capillary: 129 mg/dL — ABNORMAL HIGH (ref 70–99)
Glucose-Capillary: 128 mg/dL — ABNORMAL HIGH (ref 70–99)

## 2021-06-23 MED ORDER — HALOPERIDOL 1 MG PO TABS
1.0000 mg | ORAL_TABLET | Freq: Four times a day (QID) | ORAL | Status: DC | PRN
  Filled 2021-06-23: qty 1

## 2021-06-23 MED ORDER — HALOPERIDOL LACTATE 5 MG/ML IJ SOLN: 1.0000 mg | Freq: Four times a day (QID) | INTRAMUSCULAR | Status: DC | PRN
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
## 2021-06-23 NOTE — TOC Progression Note (Signed)
Transition of Care [facility]) - Progression Note    Patient Details  Name: [name] [name] MRN: [id number] Date of Birth: Jan 16, 1930  [facility] CM/SW [place], LCSW Phone Number: 06/23/2021, 8:56 AM  Clinical Narrative:   TOC is continuing to follow for bed offers.         Expected Discharge Plan and Services                                                 Social Determinants of Health (SDOH) Interventions    Readmission Risk Interventions No flowsheet data found.

## 2021-06-23 NOTE — Progress Notes (Signed)
PROGRESS NOTE    [name] [name]  [id number] DOB: 13-Sep-1930 DOA: 06/18/2021 PCP: Beckie Salts, MD  Brief Narrative:  87 year old female with extensive past medical history and psychiatric history recently treated in the ED for psychosis in March 2022 was brought to the ED following a possible syncopal event with collapse.  Also per documentation the patient has been exhibiting signs of either dementia, delirium or psychosis.  Granddaughter apparently unable to care for her.  I am unable to reach the granddaughter via phone.   On admission patient had a D-dimer done for unclear reasons.  This came back positive and followed by VQ scan.  Unfortunately VQ scan intermediate probability for PE.  At this time patient is hemodynamically stable.  Blood pressure and heart rate within normal range.  Patient is on room air and has normal work of breathing.  Will not anticoagulate.3  Bilateral lower extremity duplex negative for DVT.  Echocardiogram EF 55%  Assessment & Plan:   Principal Problem:   Syncope Active Problems:   HTN (hypertension)   CKD (chronic kidney disease), stage IV (HCC)   Hyperglycemia due to type 2 diabetes mellitus (HCC)   Acute metabolic encephalopathy   Major depression with psychotic features ([place])   Prolonged QT interval  Syncope with collapse History of presyncope secondary to orthostatic hypotension in July 2021 Apparent fall on occasion at home, circumstances unclear CT imaging survey reassuring VQ scan with intermediate probability for PE Low clinical suspicion consider hemodynamic stability Bilateral lower extremity Doppler negative TTE reassuring Plan: Therapy evaluations, recommend home health Fall precautions Medically ready for discharge at this time TOC working on disposition plan Ideally will need long-term care facility TOC continue to follow for bed offers  History of chronic diastolic congestive heart failure Appears euvolemic PTA Lasix   Type  2 diabetes mellitus with hyperglycemia Continue sliding-scale insulin coverage  Essential hypertension PTA Norvasc  Chronic kidney disease stage IV Creatinine baseline  History of major depression with psychotic features Patient with thoughts her granddaughter try to harm her.  Similar to previous event in March Currently no suicidal or homicidal ideation Has had intermittent agitation/delirium while in house Plan: Continue Zyprexa and citalopram Low-dose IM/p.o. Haldol as needed for agitation Trazodone as needed sleep No obvious indication for inpatient psychiatric consult   DVT prophylaxis: SQ Lovenox Code Status: Full Family Communication: None today.  Unable to reach granddaughter Disposition Plan: Status is: Inpatient  Remains inpatient appropriate because:Unsafe d/c plan  Dispo: The patient is from: Home              Anticipated d/c is to:  Long-term care facility              Patient currently is medically stable to d/c.   Difficult to place patient No  No acute medical issues require continued hospitalization.  Medically ready at this time however we are lacking a safe disposition plan.     Level of care: Med-Surg  Consultants:  None  Procedures:  None  Antimicrobials:  None   Subjective: Seen and examined.  Resting comfortably in bed.  No visible distress.  No complaints  Objective: Vitals:   06/22/21 0752 06/22/21 2015 06/23/21 0430 06/23/21 0801  BP: 140/84 (!) 132/92 (!) 153/94 136/71  Pulse: 75 (!) 108 88 85  Resp: '18 16 16 16  '$ Temp: 97.8 F (36.6 C) 98.7 F (37.1 C) 97.7 F (36.5 C) 98.7 F (37.1 C)  TempSrc: Oral Oral Oral Oral  SpO2: 99% 97%  100% 94%    Intake/Output Summary (Last 24 hours) at 06/23/2021 1108 Last data filed at 06/23/2021 0601 Gross per 24 hour  Intake --  Output 400 ml  Net -400 ml   There were no vitals filed for this visit.  Examination:  General exam: Appears calm and comfortable  Respiratory system:  Clear to auscultation. Respiratory effort normal. Cardiovascular system: S1 & S2 heard, RRR. No JVD, murmurs, rubs, gallops or clicks. No pedal edema. Gastrointestinal system: Abdomen is nondistended, soft and nontender. No organomegaly or masses felt. Normal bowel sounds heard. Central nervous system: Alert, oriented to person and place.  No focal deficits Extremities: Symmetric 5 x 5 power. Skin: No rashes, lesions or ulcers Psychiatry: Judgement and insight appear normal. Mood & affect appropriate.     Data Reviewed: I have personally reviewed following labs and imaging studies  CBC: Recent Labs  Lab 06/18/21 2020  WBC 11.7*  NEUTROABS 9.2*  HGB 12.6  HCT 39.4  MCV 98.5  PLT 99991111   Basic Metabolic Panel: Recent Labs  Lab 06/18/21 2020  NA 140  K 4.5  CL 103  CO2 26  GLUCOSE 231*  BUN 50*  CREATININE 2.61*  CALCIUM 9.5  MG 2.0   GFR: CrCl cannot be calculated (Unknown ideal weight.). Liver Function Tests: Recent Labs  Lab 06/18/21 2020  AST 29  ALT 12  ALKPHOS 63  BILITOT 1.5*  PROT 8.3*  ALBUMIN 4.1   No results for input(s): LIPASE, AMYLASE in the last 168 hours. No results for input(s): AMMONIA in the last 168 hours. Coagulation Profile: No results for input(s): INR, PROTIME in the last 168 hours. Cardiac Enzymes: No results for input(s): CKTOTAL, CKMB, CKMBINDEX, TROPONINI in the last 168 hours. BNP (last 3 results) No results for input(s): PROBNP in the last 8760 hours. HbA1C: No results for input(s): HGBA1C in the last 72 hours.  CBG: Recent Labs  Lab 06/22/21 0831 06/22/21 1214 06/22/21 1619 06/22/21 2145 06/23/21 0759  GLUCAP 131* 116* 108* 113* 128*   Lipid Profile: No results for input(s): CHOL, HDL, LDLCALC, TRIG, CHOLHDL, LDLDIRECT in the last 72 hours. Thyroid Function Tests: No results for input(s): TSH, T4TOTAL, FREET4, T3FREE, THYROIDAB in the last 72 hours. Anemia Panel: No results for input(s): VITAMINB12, FOLATE,  FERRITIN, TIBC, IRON, RETICCTPCT in the last 72 hours. Sepsis Labs: No results for input(s): PROCALCITON, LATICACIDVEN in the last 168 hours.  Recent Results (from the past 240 hour(s))  Resp Panel by RT-PCR (Flu A&B, Covid) Nasopharyngeal Swab     Status: None   Collection Time: 06/18/21  8:27 PM   Specimen: Nasopharyngeal Swab; Nasopharyngeal(NP) swabs in vial transport medium  Result Value Ref Range Status   SARS Coronavirus 2 by RT PCR NEGATIVE NEGATIVE Final    Comment: (NOTE) SARS-CoV-2 target nucleic acids are NOT DETECTED.  The SARS-CoV-2 RNA is generally detectable in upper respiratory specimens during the acute phase of infection. The lowest concentration of SARS-CoV-2 viral copies this assay can detect is 138 copies/mL. A negative result does not preclude SARS-Cov-2 infection and should not be used as the sole basis for treatment or other patient management decisions. A negative result may occur with  improper specimen collection/handling, submission of specimen other than nasopharyngeal swab, presence of viral mutation(s) within the areas targeted by this assay, and inadequate number of viral copies(<138 copies/mL). A negative result must be combined with clinical observations, patient history, and epidemiological information. The expected result is Negative.  Fact Sheet for Patients:  EntrepreneurPulse.com.au  Fact Sheet for Healthcare Providers:  IncredibleEmployment.be  This test is no t yet approved or cleared by the [place] FDA and  has been authorized for detection and/or diagnosis of SARS-CoV-2 by FDA under an Emergency Use Authorization (EUA). This EUA will remain  in effect (meaning this test can be used) for the duration of the COVID-19 declaration under Section 564(b)(1) of the Act, 21 U.S.C.section 360bbb-3(b)(1), unless the authorization is terminated  or revoked sooner.       Influenza A by PCR NEGATIVE  NEGATIVE Final   Influenza B by PCR NEGATIVE NEGATIVE Final    Comment: (NOTE) The Xpert Xpress SARS-CoV-2/FLU/RSV plus assay is intended as an aid in the diagnosis of influenza from Nasopharyngeal swab specimens and should not be used as a sole basis for treatment. Nasal washings and aspirates are unacceptable for Xpert Xpress SARS-CoV-2/FLU/RSV testing.  Fact Sheet for Patients: EntrepreneurPulse.com.au  Fact Sheet for Healthcare Providers: IncredibleEmployment.be  This test is not yet approved or cleared by the [place] FDA and has been authorized for detection and/or diagnosis of SARS-CoV-2 by FDA under an Emergency Use Authorization (EUA). This EUA will remain in effect (meaning this test can be used) for the duration of the COVID-19 declaration under Section 564(b)(1) of the Act, 21 U.S.C. section 360bbb-3(b)(1), unless the authorization is terminated or revoked.  Performed at [facility], [address], [place], [place] [address]          Radiology Studies: No results found.      Scheduled Meds:  amLODipine  2.5 mg Oral Daily   atorvastatin  20 mg Oral Daily   citalopram  20 mg Oral Daily   enoxaparin (LOVENOX) injection  30 mg Subcutaneous Q24H   furosemide  20 mg Oral Daily   insulin aspart  0-15 Units Subcutaneous TID WC   insulin aspart  0-5 Units Subcutaneous QHS   OLANZapine  2.5 mg Oral QHS   pantoprazole  40 mg Oral Daily   sodium chloride flush  3 mL Intravenous Q12H   Vitamin D (Ergocalciferol)  50,000 Units Oral Weekly   Continuous Infusions:   LOS: 4 days    Time spent: 15 minutes    Sidney Ace, MD [facility] Pager [phone]  If 7PM-7AM, please contact night-coverage 06/23/2021, 11:08 AM

## 2021-06-24 DIAGNOSIS — R55 Syncope and collapse: Secondary | ICD-10-CM | POA: Diagnosis not present

## 2021-06-24 LAB — GLUCOSE, CAPILLARY
Glucose-Capillary: 151 mg/dL — ABNORMAL HIGH (ref 70–99)
Glucose-Capillary: 139 mg/dL — ABNORMAL HIGH (ref 70–99)
Glucose-Capillary: 107 mg/dL — ABNORMAL HIGH (ref 70–99)
Glucose-Capillary: 93 mg/dL (ref 70–99)

## 2021-06-24 LAB — BASIC METABOLIC PANEL
Anion gap: 13 (ref 5–15)
BUN: 65 mg/dL — ABNORMAL HIGH (ref 8–23)
CO2: 26 mmol/L (ref 22–32)
Calcium: 9.3 mg/dL (ref 8.9–10.3)
Chloride: 97 mmol/L — ABNORMAL LOW (ref 98–111)
Creatinine, Ser: 2.67 mg/dL — ABNORMAL HIGH (ref 0.44–1.00)
GFR, Estimated: 17 mL/min — ABNORMAL LOW (ref >60)
Glucose, Bld: 152 mg/dL — ABNORMAL HIGH (ref 70–99)
Potassium: 4.3 mmol/L (ref 3.5–5.1)
Sodium: 136 mmol/L (ref 135–145)

## 2021-06-24 LAB — CBC WITH DIFFERENTIAL/PLATELET
Abs Immature Granulocytes: 0.15 10*3/uL — ABNORMAL HIGH (ref 0.00–0.07)
Basophils Absolute: 0 10*3/uL (ref 0.0–0.1)
Basophils Relative: 0 %
Eosinophils Absolute: 0.2 10*3/uL (ref 0.0–0.5)
Eosinophils Relative: 2 %
HCT: 38.1 % (ref 36.0–46.0)
Hemoglobin: 12.1 g/dL (ref 12.0–15.0)
Immature Granulocytes: 2 %
Lymphocytes Relative: 27 %
Lymphs Abs: 2.7 10*3/uL (ref 0.7–4.0)
MCH: 30.2 pg (ref 26.0–34.0)
MCHC: 31.8 g/dL (ref 30.0–36.0)
MCV: 95 fL (ref 80.0–100.0)
Monocytes Absolute: 1.2 10*3/uL — ABNORMAL HIGH (ref 0.1–1.0)
Monocytes Relative: 12 %
Neutro Abs: 5.8 10*3/uL (ref 1.7–7.7)
Neutrophils Relative %: 57 %
Platelets: 154 10*3/uL (ref 150–400)
RBC: 4.01 MIL/uL (ref 3.87–5.11)
RDW: 13.1 % (ref 11.5–15.5)
WBC: 10.1 10*3/uL (ref 4.0–10.5)
nRBC: 0 % (ref 0.0–0.2)

## 2021-06-24 NOTE — Progress Notes (Signed)
PROGRESS NOTE    [name] [name]  [id number] DOB: September 25, 1930 DOA: 06/18/2021 PCP: Beckie Salts, MD  Brief Narrative:  87 year old female with extensive past medical history and psychiatric history recently treated in the ED for psychosis in March 2022 was brought to the ED following a possible syncopal event with collapse.  Also per documentation the patient has been exhibiting signs of either dementia, delirium or psychosis.  Granddaughter apparently unable to care for her.  I am unable to reach the granddaughter via phone.   On admission patient had a D-dimer done for unclear reasons.  This came back positive and followed by VQ scan.  Unfortunately VQ scan intermediate probability for PE.  At this time patient is hemodynamically stable.  Blood pressure and heart rate within normal range.  Patient is on room air and has normal work of breathing.  Will not anticoagulate.3  Bilateral lower extremity duplex negative for DVT.  Echocardiogram EF 55%  Assessment & Plan:   Principal Problem:   Syncope Active Problems:   HTN (hypertension)   CKD (chronic kidney disease), stage IV (HCC)   Hyperglycemia due to type 2 diabetes mellitus (HCC)   Acute metabolic encephalopathy   Major depression with psychotic features ([place])   Prolonged QT interval  Syncope with collapse History of presyncope secondary to orthostatic hypotension in July 2021 Apparent fall on occasion at home, circumstances unclear CT imaging survey reassuring VQ scan with intermediate probability for PE Low clinical suspicion consider hemodynamic stability Bilateral lower extremity Doppler negative TTE reassuring Plan: Therapy evaluations, recommendations upgraded to skilled nursing facility Fall precautions Medically ready for discharge at this time TOC working on disposition plan Ideally will need long-term care facility TOC continue to follow for bed offers  History of chronic diastolic congestive heart failure Appears  euvolemic PTA Lasix   Type 2 diabetes mellitus with hyperglycemia Continue sliding-scale insulin coverage  Essential hypertension PTA Norvasc  Chronic kidney disease stage IV Creatinine baseline  History of major depression with psychotic features Patient with thoughts her granddaughter try to harm her.  Similar to previous event in March Currently no suicidal or homicidal ideation Has had intermittent agitation/delirium while in house Plan: Continue Zyprexa and citalopram Low-dose IM/p.o. Haldol as needed for agitation Trazodone as needed sleep No obvious indication for inpatient psychiatric consult   DVT prophylaxis: SQ Lovenox Code Status: Full Family Communication: None today.  Unable to reach granddaughter Disposition Plan: Status is: Inpatient  Remains inpatient appropriate because:Unsafe d/c plan  Dispo: The patient is from: Home              Anticipated d/c is to:  Long-term care facility              Patient currently is medically stable to d/c.   Difficult to place patient No  No acute medical issues require continued hospitalization.  Medically ready at this time however we are lacking a safe disposition plan.     Level of care: Med-Surg  Consultants:  None  Procedures:  None  Antimicrobials:  None   Subjective: Seen and examined.  Resting comfortably in bed.  No visible distress.  No complaints  Objective: Vitals:   06/23/21 0801 06/23/21 1927 06/24/21 0516 06/24/21 0756  BP: 136/71 126/76 123/70 117/67  Pulse: 85 86 72 77  Resp: '16 16 20 16  '$ Temp: 98.7 F (37.1 C) 98.1 F (36.7 C) 98 F (36.7 C) 97.6 F (36.4 C)  TempSrc: Oral  Oral Oral  SpO2: 94% 100%  99% 95%    Intake/Output Summary (Last 24 hours) at 06/24/2021 1230 Last data filed at 06/23/2021 1700 Gross per 24 hour  Intake 240 ml  Output --  Net 240 ml   There were no vitals filed for this visit.  Examination:  General exam: Appears calm and comfortable  Respiratory  system: Clear to auscultation. Respiratory effort normal. Cardiovascular system: S1 & S2 heard, RRR. No JVD, murmurs, rubs, gallops or clicks. No pedal edema. Gastrointestinal system: Abdomen is nondistended, soft and nontender. No organomegaly or masses felt. Normal bowel sounds heard. Central nervous system: Alert, oriented to person and place.  No focal deficits Extremities: Symmetric 5 x 5 power. Skin: No rashes, lesions or ulcers Psychiatry: Judgement and insight appear normal. Mood & affect appropriate.     Data Reviewed: I have personally reviewed following labs and imaging studies  CBC: Recent Labs  Lab 06/18/21 2020 06/24/21 0511  WBC 11.7* 10.1  NEUTROABS 9.2* 5.8  HGB 12.6 12.1  HCT 39.4 38.1  MCV 98.5 95.0  PLT 189 123456   Basic Metabolic Panel: Recent Labs  Lab 06/18/21 2020 06/24/21 0511  NA 140 136  K 4.5 4.3  CL 103 97*  CO2 26 26  GLUCOSE 231* 152*  BUN 50* 65*  CREATININE 2.61* 2.67*  CALCIUM 9.5 9.3  MG 2.0  --    GFR: CrCl cannot be calculated (Unknown ideal weight.). Liver Function Tests: Recent Labs  Lab 06/18/21 2020  AST 29  ALT 12  ALKPHOS 63  BILITOT 1.5*  PROT 8.3*  ALBUMIN 4.1   No results for input(s): LIPASE, AMYLASE in the last 168 hours. No results for input(s): AMMONIA in the last 168 hours. Coagulation Profile: No results for input(s): INR, PROTIME in the last 168 hours. Cardiac Enzymes: No results for input(s): CKTOTAL, CKMB, CKMBINDEX, TROPONINI in the last 168 hours. BNP (last 3 results) No results for input(s): PROBNP in the last 8760 hours. HbA1C: No results for input(s): HGBA1C in the last 72 hours.  CBG: Recent Labs  Lab 06/23/21 1147 06/23/21 1532 06/23/21 2057 06/24/21 0757 06/24/21 1125  GLUCAP 128* 129* 128* 151* 139*   Lipid Profile: No results for input(s): CHOL, HDL, LDLCALC, TRIG, CHOLHDL, LDLDIRECT in the last 72 hours. Thyroid Function Tests: No results for input(s): TSH, T4TOTAL, FREET4,  T3FREE, THYROIDAB in the last 72 hours. Anemia Panel: No results for input(s): VITAMINB12, FOLATE, FERRITIN, TIBC, IRON, RETICCTPCT in the last 72 hours. Sepsis Labs: No results for input(s): PROCALCITON, LATICACIDVEN in the last 168 hours.  Recent Results (from the past 240 hour(s))  Resp Panel by RT-PCR (Flu A&B, Covid) Nasopharyngeal Swab     Status: None   Collection Time: 06/18/21  8:27 PM   Specimen: Nasopharyngeal Swab; Nasopharyngeal(NP) swabs in vial transport medium  Result Value Ref Range Status   SARS Coronavirus 2 by RT PCR NEGATIVE NEGATIVE Final    Comment: (NOTE) SARS-CoV-2 target nucleic acids are NOT DETECTED.  The SARS-CoV-2 RNA is generally detectable in upper respiratory specimens during the acute phase of infection. The lowest concentration of SARS-CoV-2 viral copies this assay can detect is 138 copies/mL. A negative result does not preclude SARS-Cov-2 infection and should not be used as the sole basis for treatment or other patient management decisions. A negative result may occur with  improper specimen collection/handling, submission of specimen other than nasopharyngeal swab, presence of viral mutation(s) within the areas targeted by this assay, and inadequate number of viral copies(<138 copies/mL). A negative result  must be combined with clinical observations, patient history, and epidemiological information. The expected result is Negative.  Fact Sheet for Patients:  EntrepreneurPulse.com.au  Fact Sheet for Healthcare Providers:  IncredibleEmployment.be  This test is no t yet approved or cleared by the [place] FDA and  has been authorized for detection and/or diagnosis of SARS-CoV-2 by FDA under an Emergency Use Authorization (EUA). This EUA will remain  in effect (meaning this test can be used) for the duration of the COVID-19 declaration under Section 564(b)(1) of the Act, 21 U.S.C.section 360bbb-3(b)(1),  unless the authorization is terminated  or revoked sooner.       Influenza A by PCR NEGATIVE NEGATIVE Final   Influenza B by PCR NEGATIVE NEGATIVE Final    Comment: (NOTE) The Xpert Xpress SARS-CoV-2/FLU/RSV plus assay is intended as an aid in the diagnosis of influenza from Nasopharyngeal swab specimens and should not be used as a sole basis for treatment. Nasal washings and aspirates are unacceptable for Xpert Xpress SARS-CoV-2/FLU/RSV testing.  Fact Sheet for Patients: EntrepreneurPulse.com.au  Fact Sheet for Healthcare Providers: IncredibleEmployment.be  This test is not yet approved or cleared by the [place] FDA and has been authorized for detection and/or diagnosis of SARS-CoV-2 by FDA under an Emergency Use Authorization (EUA). This EUA will remain in effect (meaning this test can be used) for the duration of the COVID-19 declaration under Section 564(b)(1) of the Act, 21 U.S.C. section 360bbb-3(b)(1), unless the authorization is terminated or revoked.  Performed at [facility], [address] [place], [place] [address]          Radiology Studies: No results found.      Scheduled Meds:  amLODipine  2.5 mg Oral Daily   atorvastatin  20 mg Oral Daily   citalopram  20 mg Oral Daily   enoxaparin (LOVENOX) injection  30 mg Subcutaneous Q24H   furosemide  20 mg Oral Daily   insulin aspart  0-15 Units Subcutaneous TID WC   insulin aspart  0-5 Units Subcutaneous QHS   OLANZapine  2.5 mg Oral QHS   pantoprazole  40 mg Oral Daily   sodium chloride flush  3 mL Intravenous Q12H   Vitamin D (Ergocalciferol)  50,000 Units Oral Weekly   Continuous Infusions:   LOS: 5 days    Time spent: 15 minutes    Sidney Ace, MD [facility] Pager [phone]  If 7PM-7AM, please contact night-coverage 06/24/2021, 12:30 PM

## 2021-06-24 NOTE — Progress Notes (Signed)
Occupational Therapy Treatment Patient Details Name: Khailah Boyle MRN: [id number] DOB: 04/21/1930 Today's Date: 06/24/2021    History of present illness Talayia Gunion is an 9yoF who comes to [facility] on 06/18/21 after syncope and fall at home. Pt arrives wth confusion. Per chart, pt was seen by Psychiatry in March 2022 after some confusion and paranoia regarding family trying to kill her. These reports psych has attributed to pyschosis, have been similar to prior presentation over the last few years per chart. PMH: DM, HTN, dCHF4, depression.   OT comments  Pt seen for OT treatment on this date. Upon arrival to room, pt awake and seated upright in bed with HR 86. Pt A&Ox2 and agreeable to OT tx. Pt currently presents with decreased awareness of deficits, decreased balance, and decreased activity tolerance. Due to these impairments, pt requires MIN A for seated UB dressing, MIN A for functional mobility of short household distances (~15 ft) with RW, and MIN GUARD + verbal cues for standing grooming & hygiene tasks. Pt was noted to have elevated HR (130s) during OOB mobility and standing ADLs, however HR returned to resting HR (92) following activity cessation. Pt is making good progress toward goals and continues to benefit from skilled OT services to maximize return to PLOF and minimize risk of future falls, injury, caregiver burden, and readmission. Will continue to follow POC. Discharge recommendation remains appropriate.     Follow Up Recommendations  SNF    Equipment Recommendations  Other (comment) (defer to next venue of care)       Precautions / Restrictions Precautions Precautions: Fall Restrictions Weight Bearing Restrictions: No       Mobility Bed Mobility Overal bed mobility: Needs Assistance Bed Mobility: Supine to Sit     Supine to sit: Min guard;HOB elevated Sit to supine: Supervision;HOB elevated (x trial; vc's for positioning)   General bed mobility comments: no physical assist  required with HOB elevated. vc's for technique    Transfers Overall transfer level: Needs assistance Equipment used: Rolling walker (2 wheeled) Transfers: Sit to/from Stand Sit to Stand: Min assist         General transfer comment: x2 bouts    Balance Overall balance assessment: Needs assistance Sitting-balance support: No upper extremity supported;Feet supported Sitting balance-Leahy Scale: Good Sitting balance - Comments: steady sitting reaching within BOS   Standing balance support: Bilateral upper extremity supported;During functional activity Standing balance-Leahy Scale: Fair Standing balance comment: reliant on UE support on RW for functional mobility of household distances                           ADL either performed or assessed with clinical judgement   ADL Overall ADL's : Needs assistance/impaired     Grooming: Wash/dry hands;Wash/dry face;Min guard;Bed level Grooming Details (indicate cue type and reason): Requires verbal cues for task initiation         Upper Body Dressing : Minimal assistance;Sitting Upper Body Dressing Details (indicate cue type and reason): to don/doff hospital gown                 Functional mobility during ADLs: Minimal assistance;Rolling walker        Cognition Arousal/Alertness: Awake/alert Behavior During Therapy: WFL for tasks assessed/performed Overall Cognitive Status: No family/caregiver present to determine baseline cognitive functioning  General Comments: A&Ox2. Pleasant and agreeable throughout. Requires verbal cues for initiating tasks. Demonstrates some short term memory deficits              General Comments HR 80s-90s at beginning and end of session. HR 130s during OOB mobility. RN informed    Pertinent Vitals/ Pain       Pain Assessment: No/denies pain   Frequency  Min 1X/week        Progress Toward Goals  OT Goals(current goals can  now be found in the care plan section)  Progress towards OT goals: Progressing toward goals  Acute Rehab OT Goals OT Goal Formulation: With patient Time For Goal Achievement: 07/03/21  Plan Discharge plan remains appropriate;Frequency remains appropriate    Co-evaluation                 AM-PAC OT "6 Clicks" Daily Activity     Outcome Measure   Help from another person eating meals?: None Help from another person taking care of personal grooming?: A Little Help from another person toileting, which includes using toliet, bedpan, or urinal?: A Lot Help from another person bathing (including washing, rinsing, drying)?: A Lot Help from another person to put on and taking off regular upper body clothing?: A Little Help from another person to put on and taking off regular lower body clothing?: A Lot 6 Click Score: 16    End of Session Equipment Utilized During Treatment: Gait belt;Rolling walker  OT Visit Diagnosis: Unsteadiness on feet (R26.81);Muscle weakness (generalized) (M62.81)   Activity Tolerance Patient tolerated treatment well   Patient Left in chair;with call bell/phone within reach;with chair alarm set   Nurse Communication Mobility status;Other (comment) (elevated HR during OOB mobility)        Time: [id number] OT Time Calculation (min): 25 min  Charges: OT General Charges $OT Visit: 1 Visit OT Treatments $Self Care/Home Management : 23-37 mins  Fredirick Maudlin, OTR/L [employer]

## 2021-06-24 NOTE — Progress Notes (Signed)
Physical Therapy Treatment Patient Details Name: [name] [name] MRN: [id number] DOB: Aug 21, 1930 Today's Date: 06/24/2021    History of Present Illness [name] [name] is an 62yoF who comes to [facility] on 06/18/21 after syncope and fall at home. Pt arrives wth confusion. Per chart, pt was seen by Psychiatry in March 2022 after some confusion and paranoia regarding family trying to kill her. These reports psych has attributed to pyschosis, have been similar to prior presentation over the last few years per chart. PMH: DM, HTN, dCHF4, depression.    PT Comments    Pt resting in bed upon PT arrival; pleasant and agreeable to PT session.  Min assist with transfers and CGA to min assist with ambulation 100 feet with RW.  Pt's HR noted to increase to 146 bpm post ambulation but with sitting rest break pt's HR improved to 97-103 bpm end of therapy session at rest (nurse notified and reported she would notify MD of elevated HR with activity).  Will continue to focus on strengthening and progressive functional mobility during hospitalization.    Follow Up Recommendations  SNF     Equipment Recommendations  Rolling walker with 5" wheels;3in1 (PT)    Recommendations for Other Services       Precautions / Restrictions Precautions Precautions: Fall Restrictions Weight Bearing Restrictions: No    Mobility  Bed Mobility Overal bed mobility: Needs Assistance Bed Mobility: Supine to Sit;Sit to Supine     Supine to sit: Min assist;Mod assist;HOB elevated (x2 trials; assist for trunk) Sit to supine: Supervision;HOB elevated (x trial; vc's for positioning)   General bed mobility comments: vc's for technique    Transfers Overall transfer level: Needs assistance Equipment used: Rolling walker (2 wheeled) Transfers: Sit to/from Stand Sit to Stand: Min assist         General transfer comment: x2 trials standing from bed; vc's for UE placement  Ambulation/Gait Ambulation/Gait assistance: Min guard;Min  assist Gait Distance (Feet): 100 Feet Assistive device: Rolling walker (2 wheeled)   Gait velocity: decreased   General Gait Details: vc's to stay closer to RW; decreased stance time L LE   Stairs             Wheelchair Mobility    Modified Rankin (Stroke Patients Only)       Balance Overall balance assessment: Needs assistance Sitting-balance support: No upper extremity supported;Feet supported Sitting balance-Leahy Scale: Good Sitting balance - Comments: steady sitting reaching within BOS   Standing balance support: Bilateral upper extremity supported;During functional activity Standing balance-Leahy Scale: Fair Standing balance comment: reliant on UE support on RW for ambulation                            Cognition Arousal/Alertness: Awake/alert Behavior During Therapy: WFL for tasks assessed/performed Overall Cognitive Status: Within Functional Limits for tasks assessed                                 General Comments: Pleasant and cooperative      Exercises      General Comments  Nursing cleared pt for participation in physical therapy.  Pt agreeable to PT session.      Pertinent Vitals/Pain Pain Assessment: No/denies pain O2 sats WFL on room air during sessions activities.    Home Living  Prior Function            PT Goals (current goals can now be found in the care plan section) Acute Rehab PT Goals PT Goal Formulation: With patient Time For Goal Achievement: 07/04/21 Progress towards PT goals: Progressing toward goals    Frequency    Min 2X/week      PT Plan Discharge plan needs to be updated    Co-evaluation              AM-PAC PT "6 Clicks" Mobility   Outcome Measure  Help needed turning from your back to your side while in a flat bed without using bedrails?: A Little Help needed moving from lying on your back to sitting on the side of a flat bed without using  bedrails?: A Lot Help needed moving to and from a bed to a chair (including a wheelchair)?: A Little Help needed standing up from a chair using your arms (e.g., wheelchair or bedside chair)?: A Little Help needed to walk in hospital room?: A Little Help needed climbing 3-5 steps with a railing? : A Lot 6 Click Score: 16    End of Session Equipment Utilized During Treatment: Gait belt Activity Tolerance: Other (comment) (Limited ambulation d/t HR increase with activity) Patient left:  (sitting on edge of bed with 2 NT's present changing pt's gown (NT's to assist with setting pt up and placing bed alarm when done)) Nurse Communication: Mobility status;Precautions;Other (comment) (Pt's increased HR with activity) PT Visit Diagnosis: Other abnormalities of gait and mobility (R26.89);Muscle weakness (generalized) (M62.81);History of falling (Z91.81);Difficulty in walking, not elsewhere classified (R26.2)     Time: [id number] PT Time Calculation (min) (ACUTE ONLY): 23 min  Charges:  $Gait Training: 8-22 mins $Therapeutic Activity: 8-22 mins                     Leitha Bleak, PT 06/24/21, 10:31 AM

## 2021-06-25 DIAGNOSIS — R55 Syncope and collapse: Secondary | ICD-10-CM | POA: Diagnosis not present

## 2021-06-25 LAB — GLUCOSE, CAPILLARY
Glucose-Capillary: 83 mg/dL (ref 70–99)
Glucose-Capillary: 124 mg/dL — ABNORMAL HIGH (ref 70–99)
Glucose-Capillary: 102 mg/dL — ABNORMAL HIGH (ref 70–99)
Glucose-Capillary: 96 mg/dL (ref 70–99)

## 2021-06-25 NOTE — Progress Notes (Signed)
Mobility Specialist - Progress Note   06/25/21 1300  Mobility  Activity Ambulated in hall  Level of Assistance Standby assist, set-up cues, supervision of patient - no hands on  Assistive Device Front wheel walker  Distance Ambulated (ft) 75 ft  Mobility Ambulated with assistance in hallway  Mobility Response Tolerated well  Mobility performed by Mobility specialist  $Mobility charge 1 Mobility    Pt sitting in recliner upon arrival, utilizing RA. Pt still confused, asks mobility to wait to begin session as she is talking to someone. Only pt and mobility tech in room at this time. Pt ambulated in hallway with supervision and RW, no LOB. Supervision and extra time to stand from recliner. Pt denied pain this date. Pt returned to recliner with needs in reach and alarm set.    Kathee Delton Mobility Specialist 06/25/21, 1:08 PM

## 2021-06-25 NOTE — TOC Progression Note (Signed)
Transition of Care [facility]) - Progression Note    Patient Details  Name: [name] [name] MRN: [id number] Date of Birth: November 24, 1929  [facility]) CM/SW Contact  Eileen Stanford, LCSW Phone Number: 06/25/2021, 9:41 AM  Clinical Narrative:   I reached out to [employer] to assist in LTC placement.         Expected Discharge Plan and Services                                                 Social Determinants of Health (SDOH) Interventions    Readmission Risk Interventions No flowsheet data found.

## 2021-06-25 NOTE — Progress Notes (Signed)
PROGRESS NOTE    [name] [name]  [id number] DOB: 13-Oct-1930 DOA: 06/18/2021 PCP: Beckie Salts, MD  Brief Narrative:  87 year old female with extensive past medical history and psychiatric history recently treated in the ED for psychosis in March 2022 was brought to the ED following a possible syncopal event with collapse.  Also per documentation the patient has been exhibiting signs of either dementia, delirium or psychosis.  Granddaughter apparently unable to care for her.  I am unable to reach the granddaughter via phone.   On admission patient had a D-dimer done for unclear reasons.  This came back positive and followed by VQ scan.  Unfortunately VQ scan intermediate probability for PE.  At this time patient is hemodynamically stable.  Blood pressure and heart rate within normal range.  Patient is on room air and has normal work of breathing.  Will not anticoagulate.3  Bilateral lower extremity duplex negative for DVT.  Echocardiogram EF 55%  Assessment & Plan:   Principal Problem:   Syncope Active Problems:   HTN (hypertension)   CKD (chronic kidney disease), stage IV (HCC)   Hyperglycemia due to type 2 diabetes mellitus (HCC)   Acute metabolic encephalopathy   Major depression with psychotic features ([place])   Prolonged QT interval  Syncope with collapse History of presyncope secondary to orthostatic hypotension in July 2021 Apparent fall on occasion at home, circumstances unclear CT imaging survey reassuring VQ scan with intermediate probability for PE Low clinical suspicion consider hemodynamic stability Bilateral lower extremity Doppler negative TTE reassuring Plan: Therapy evaluations, recommendations upgraded to skilled nursing facility Fall precautions Medically ready for discharge at this time [facility] working on disposition plan Ideally will need long-term care facility TOC continue to follow for bed offers TOC reached out to [employer] to assist in LTC  placement  History of chronic diastolic congestive heart failure Appears euvolemic PTA Lasix   Type 2 diabetes mellitus with hyperglycemia Continue sliding-scale insulin coverage  Essential hypertension PTA Norvasc  Chronic kidney disease stage IV Creatinine baseline  History of major depression with psychotic features Patient with thoughts her granddaughter try to harm her.  Similar to previous event in March Currently no suicidal or homicidal ideation Has had intermittent agitation/delirium while in house Plan: Continue Zyprexa and citalopram Low-dose IM/p.o. Haldol as needed for agitation Trazodone as needed sleep No obvious indication for inpatient psychiatric consult   DVT prophylaxis: SQ Lovenox Code Status: Full Family Communication: None today.  Unable to reach granddaughter Disposition Plan: Status is: Inpatient  Remains inpatient appropriate because:Unsafe d/c plan  Dispo: The patient is from: Home              Anticipated d/c is to:  Long-term care facility              Patient currently is medically stable to d/c.   Difficult to place patient No  No acute medical issues require continued hospitalization.  Medically ready at this time however we are lacking a safe disposition plan.     Level of care: Med-Surg  Consultants:  None  Procedures:  None  Antimicrobials:  None   Subjective: Seen and examined.  Resting comfortably in bed.  No visible distress.  No complaints  Objective: Vitals:   06/24/21 1919 06/25/21 0500 06/25/21 0540 06/25/21 0753  BP: 106/64  (!) 145/81 133/76  Pulse: 91  70 61  Resp: '18  16 18  '$ Temp: 98.2 F (36.8 C)  98.5 F (36.9 C) 98.4 F (36.9 C)  TempSrc:    Oral  SpO2: 99%  97% 97%  Weight:  83.9 kg      Intake/Output Summary (Last 24 hours) at 06/25/2021 1357 Last data filed at 06/25/2021 0900 Gross per 24 hour  Intake 840 ml  Output 300 ml  Net 540 ml   Filed Weights   06/25/21 0500  Weight: 83.9 kg     Examination:  General exam: Appears calm and comfortable  Respiratory system: Clear to auscultation. Respiratory effort normal. Cardiovascular system: S1 & S2 heard, RRR. No JVD, murmurs, rubs, gallops or clicks. No pedal edema. Gastrointestinal system: Abdomen is nondistended, soft and nontender. No organomegaly or masses felt. Normal bowel sounds heard. Central nervous system: Alert, oriented to person and place.  No focal deficits Extremities: Symmetric 5 x 5 power. Skin: No rashes, lesions or ulcers Psychiatry: Judgement and insight appear normal. Mood & affect appropriate.     Data Reviewed: I have personally reviewed following labs and imaging studies  CBC: Recent Labs  Lab 06/18/21 2020 06/24/21 0511  WBC 11.7* 10.1  NEUTROABS 9.2* 5.8  HGB 12.6 12.1  HCT 39.4 38.1  MCV 98.5 95.0  PLT 189 [id number]   Basic Metabolic Panel: Recent Labs  Lab 06/18/21 2020 06/24/21 0511  NA 140 136  K 4.5 4.3  CL 103 97*  CO2 26 26  GLUCOSE 231* 152*  BUN 50* 65*  CREATININE 2.61* 2.67*  CALCIUM 9.5 9.3  MG 2.0  --    GFR: CrCl cannot be calculated (Unknown ideal weight.). Liver Function Tests: Recent Labs  Lab 06/18/21 2020  AST 29  ALT 12  ALKPHOS 63  BILITOT 1.5*  PROT 8.3*  ALBUMIN 4.1   No results for input(s): LIPASE, AMYLASE in the last 168 hours. No results for input(s): AMMONIA in the last 168 hours. Coagulation Profile: No results for input(s): INR, PROTIME in the last 168 hours. Cardiac Enzymes: No results for input(s): CKTOTAL, CKMB, CKMBINDEX, TROPONINI in the last 168 hours. BNP (last 3 results) No results for input(s): PROBNP in the last 8760 hours. HbA1C: No results for input(s): HGBA1C in the last 72 hours.  CBG: Recent Labs  Lab 06/24/21 1125 06/24/21 1624 06/24/21 2045 06/25/21 0755 06/25/21 1139  GLUCAP 139* 107* 93 83 124*   Lipid Profile: No results for input(s): CHOL, HDL, LDLCALC, TRIG, CHOLHDL, LDLDIRECT in the last 72  hours. Thyroid Function Tests: No results for input(s): TSH, T4TOTAL, FREET4, T3FREE, THYROIDAB in the last 72 hours. Anemia Panel: No results for input(s): VITAMINB12, FOLATE, FERRITIN, TIBC, IRON, RETICCTPCT in the last 72 hours. Sepsis Labs: No results for input(s): PROCALCITON, LATICACIDVEN in the last 168 hours.  Recent Results (from the past 240 hour(s))  Resp Panel by RT-PCR (Flu A&B, Covid) Nasopharyngeal Swab     Status: None   Collection Time: 06/18/21  8:27 PM   Specimen: Nasopharyngeal Swab; Nasopharyngeal(NP) swabs in vial transport medium  Result Value Ref Range Status   SARS Coronavirus 2 by RT PCR NEGATIVE NEGATIVE Final    Comment: (NOTE) SARS-CoV-2 target nucleic acids are NOT DETECTED.  The SARS-CoV-2 RNA is generally detectable in upper respiratory specimens during the acute phase of infection. The lowest concentration of SARS-CoV-2 viral copies this assay can detect is 138 copies/mL. A negative result does not preclude SARS-Cov-2 infection and should not be used as the sole basis for treatment or other patient management decisions. A negative result may occur with  improper specimen collection/handling, submission of specimen other than nasopharyngeal swab,  presence of viral mutation(s) within the areas targeted by this assay, and inadequate number of viral copies(<138 copies/mL). A negative result must be combined with clinical observations, patient history, and epidemiological information. The expected result is Negative.  Fact Sheet for Patients:  EntrepreneurPulse.com.au  Fact Sheet for Healthcare Providers:  IncredibleEmployment.be  This test is no t yet approved or cleared by the [place] FDA and  has been authorized for detection and/or diagnosis of SARS-CoV-2 by FDA under an Emergency Use Authorization (EUA). This EUA will remain  in effect (meaning this test can be used) for the duration of the COVID-19  declaration under Section 564(b)(1) of the Act, 21 U.S.C.section 360bbb-3(b)(1), unless the authorization is terminated  or revoked sooner.       Influenza A by PCR NEGATIVE NEGATIVE Final   Influenza B by PCR NEGATIVE NEGATIVE Final    Comment: (NOTE) The Xpert Xpress SARS-CoV-2/FLU/RSV plus assay is intended as an aid in the diagnosis of influenza from Nasopharyngeal swab specimens and should not be used as a sole basis for treatment. Nasal washings and aspirates are unacceptable for Xpert Xpress SARS-CoV-2/FLU/RSV testing.  Fact Sheet for Patients: EntrepreneurPulse.com.au  Fact Sheet for Healthcare Providers: IncredibleEmployment.be  This test is not yet approved or cleared by the [place] FDA and has been authorized for detection and/or diagnosis of SARS-CoV-2 by FDA under an Emergency Use Authorization (EUA). This EUA will remain in effect (meaning this test can be used) for the duration of the COVID-19 declaration under Section 564(b)(1) of the Act, 21 U.S.C. section 360bbb-3(b)(1), unless the authorization is terminated or revoked.  Performed at [facility], [address], [place], [place] [address]          Radiology Studies: No results found.      Scheduled Meds:  amLODipine  2.5 mg Oral Daily   atorvastatin  20 mg Oral Daily   citalopram  20 mg Oral Daily   enoxaparin (LOVENOX) injection  30 mg Subcutaneous Q24H   furosemide  20 mg Oral Daily   insulin aspart  0-15 Units Subcutaneous TID WC   insulin aspart  0-5 Units Subcutaneous QHS   OLANZapine  2.5 mg Oral QHS   pantoprazole  40 mg Oral Daily   sodium chloride flush  3 mL Intravenous Q12H   Vitamin D (Ergocalciferol)  50,000 Units Oral Weekly   Continuous Infusions:   LOS: 6 days    Time spent: 15 minutes    Sidney Ace, MD [facility] Pager [phone]  If 7PM-7AM, please contact night-coverage 06/25/2021, 1:57 PM

## 2021-06-26 DIAGNOSIS — R55 Syncope and collapse: Principal | ICD-10-CM

## 2021-06-26 LAB — GLUCOSE, CAPILLARY
Glucose-Capillary: 168 mg/dL — ABNORMAL HIGH (ref 70–99)
Glucose-Capillary: 123 mg/dL — ABNORMAL HIGH (ref 70–99)
Glucose-Capillary: 84 mg/dL (ref 70–99)
Glucose-Capillary: 109 mg/dL — ABNORMAL HIGH (ref 70–99)

## 2021-06-26 MED ORDER — HEPARIN SODIUM (PORCINE) 5000 UNIT/ML IJ SOLN
5000.0000 [IU] | Freq: Three times a day (TID) | INTRAMUSCULAR | Status: DC
  Administered 2021-06-26 – 2021-07-02 (×18): 5000 [IU] via SUBCUTANEOUS
  Filled 2021-06-26 (×18): qty 1

## 2021-06-26 MED ORDER — GLIMEPIRIDE 1 MG PO TABS
1.0000 mg | ORAL_TABLET | Freq: Every day | ORAL | Status: DC
  Administered 2021-06-27 – 2021-06-28 (×2): 1 mg via ORAL
  Filled 2021-06-26 (×3): qty 1

## 2021-06-26 NOTE — Progress Notes (Signed)
PT Cancellation Note  Patient Details Name: [name] [name] MRN: [id number] DOB: [birth year]   Cancelled Treatment:    Reason Eval/Treat Not Completed: Other (comment).  Chart reviewed.  Pt resting in bed upon PT arrival with eyes closed.  When therapist said hello to pt, pt talking with therapist but pt declining mobility d/t wanting to sleep in bed instead; unable to encourage pt to participate in therapy.  Will re-attempt PT session at a later date/time.  Leitha Bleak, PT 06/26/21, 11:42 AM

## 2021-06-26 NOTE — TOC Progression Note (Signed)
Transition of Care [facility]) - Progression Note    Patient Details  Name: [name] [name] MRN: [id number] Date of Birth: [birth year]  Transition of Care [facility]) CM/SW Contact  Eileen Stanford, LCSW Phone Number: 06/26/2021, 10:11 AM  Clinical Narrative:  Per Andee Poles with [facility], [facility] states they may have some LTC beds. Referral sent to [email or web address].          Expected Discharge Plan and Services                                                 Social Determinants of Health (SDOH) Interventions    Readmission Risk Interventions No flowsheet data found.

## 2021-06-26 NOTE — Progress Notes (Signed)
PT Cancellation Note  Patient Details Name: [name] [name] MRN: [id number] DOB: 20-Feb-1930   Cancelled Treatment:    Reason Eval/Treat Not Completed: Patient declined, no reason specified.  Chart reviewed.  Pt sitting in recliner upon PT arrival (food tray in front of pt).  Pt refusing all mobility/activities at this time and requesting to stay in recliner.  Will re-attempt PT session at a later date/time.  Leitha Bleak, PT 06/26/21, 4:27 PM

## 2021-06-26 NOTE — Progress Notes (Signed)
Mobility Specialist - Progress Note   06/26/21 1800  Mobility  Activity Transferred:  Chair to bed  Level of Assistance Standby assist, set-up cues, supervision of patient - no hands on  Assistive Device Front wheel walker  Distance Ambulated (ft) 2 ft  Mobility Ambulated with assistance in room  Mobility Response Tolerated well  Mobility performed by Mobility specialist  $Mobility charge 1 Mobility    Pt transferred back to bed with supervision, no LOB. Noticed soiled chucks upon standing. Mobility provided new gown and assist with peri-hygiene. Pt transferred back to EOB and took several lateral steps towards HOB. Pt returned supine with supervision and extra time. Pt left in bed with all needs in reach and alarm set.    Kathee Delton Mobility Specialist 06/26/21, 6:07 PM

## 2021-06-26 NOTE — Progress Notes (Signed)
PROGRESS NOTE    [name] [name]  [id number] DOB: [birth year] DOA: 06/18/2021 PCP: Beckie Salts, MD  Brief Narrative:  87 year old female with extensive past medical history and psychiatric history recently treated in the ED for psychosis in March 2022 was brought to the ED following a possible syncopal event with collapse.  Also per documentation the patient has been exhibiting signs of either dementia, delirium or psychosis.  Granddaughter apparently unable to care for her.  I am unable to reach the granddaughter via phone.  Bilateral lower extremity duplex negative for DVT.  Echocardiogram EF 55%  Assessment & Plan:   Principal Problem:   Syncope Active Problems:   HTN (hypertension)   CKD (chronic kidney disease), stage IV (HCC)   Hyperglycemia due to type 2 diabetes mellitus (HCC)   Acute metabolic encephalopathy   Major depression with psychotic features ([place])   Prolonged QT interval  Syncope with collapse History of presyncope secondary to orthostatic hypotension in July 2021 Apparent fall on occasion at home, circumstances unclear CT imaging survey reassuring VQ scan with intermediate probability for PE Low clinical suspicion consider hemodynamic stability Bilateral lower extremity Doppler negative TTE reassuring Plan: Therapy evaluations, recommendations upgraded to skilled nursing facility Fall precautions Medically ready for discharge at this time [facility] working on disposition plan Ideally will need long-term care facility TOC continue to follow for bed offers TOC reached out to care patrol to assist in LTC placement  History of chronic diastolic congestive heart failure Appears euvolemic PTA Lasix daily  Type 2 diabetes mellitus with hyperglycemia Here glucose wnl. Will stop SSI and start low dose glimepiride 1 mg daily, monitor fasting sugars  Essential hypertension Hold home amlodipine of 2.5, bps here have been mostly wnl  Chronic kidney disease stage  IV Creatinine baseline  History of major depression with psychotic features Patient with thoughts her granddaughter try to harm her.  Similar to previous event in March Currently no suicidal or homicidal ideation Has had intermittent agitation/delirium while in house Plan: Continue Zyprexa and citalopram Low-dose IM/p.o. Haldol as needed for agitation Trazodone as needed sleep No obvious indication for inpatient psychiatric consult   DVT prophylaxis: heparin Code Status: Full Family Communication: None today.  Unable to reach granddaughter Disposition Plan: Status is: Inpatient  Remains inpatient appropriate because:Unsafe d/c plan  Dispo: The patient is from: Home              Anticipated d/c is to:  Long-term care facility              Patient currently is medically stable to d/c.   Difficult to place patient No  No acute medical issues require continued hospitalization.  Medically ready at this time however we are lacking a safe disposition plan.     Level of care: Med-Surg  Consultants:  None  Procedures:  None  Antimicrobials:  None   Subjective: Seen and examined.  Resting comfortably in bed.  No visible distress.  No complaints  Objective: Vitals:   06/26/21 0417 06/26/21 0500 06/26/21 0745 06/26/21 1518  BP: 123/86  140/74 (!) 114/56  Pulse: 79  83 84  Resp: '14  16 18  '$ Temp: 98.1 F (36.7 C)  98.4 F (36.9 C) 98.4 F (36.9 C)  TempSrc: Oral  Oral Oral  SpO2: 98%  97% (!) 80%  Weight:  82.6 kg      Intake/Output Summary (Last 24 hours) at 06/26/2021 1615 Last data filed at 06/26/2021 1006 Gross per 24 hour  Intake  360 ml  Output --  Net 360 ml   Filed Weights   06/25/21 0500 06/26/21 0500  Weight: 83.9 kg 82.6 kg    Examination:  General exam: Appears calm and comfortable  Respiratory system: Clear to auscultation. Respiratory effort normal. Cardiovascular system: S1 & S2 heard, RRR. No JVD, murmurs, rubs, gallops or clicks. No pedal  edema. Gastrointestinal system: Abdomen is nondistended, soft and nontender. No organomegaly or masses felt. Normal bowel sounds heard. Central nervous system: Alert, oriented to person and place.  No focal deficits Extremities: Symmetric 5 x 5 power. Skin: No rashes, lesions or ulcers Psychiatry: Judgement and insight appear normal. Mood & affect appropriate.     Data Reviewed: I have personally reviewed following labs and imaging studies  CBC: Recent Labs  Lab 06/24/21 0511  WBC 10.1  NEUTROABS 5.8  HGB 12.1  HCT 38.1  MCV 95.0  PLT 123456   Basic Metabolic Panel: Recent Labs  Lab 06/24/21 0511  NA 136  K 4.3  CL 97*  CO2 26  GLUCOSE 152*  BUN 65*  CREATININE 2.67*  CALCIUM 9.3   GFR: CrCl cannot be calculated (Unknown ideal weight.). Liver Function Tests: No results for input(s): AST, ALT, ALKPHOS, BILITOT, PROT, ALBUMIN in the last 168 hours.  No results for input(s): LIPASE, AMYLASE in the last 168 hours. No results for input(s): AMMONIA in the last 168 hours. Coagulation Profile: No results for input(s): INR, PROTIME in the last 168 hours. Cardiac Enzymes: No results for input(s): CKTOTAL, CKMB, CKMBINDEX, TROPONINI in the last 168 hours. BNP (last 3 results) No results for input(s): PROBNP in the last 8760 hours. HbA1C: No results for input(s): HGBA1C in the last 72 hours.  CBG: Recent Labs  Lab 06/25/21 1139 06/25/21 1639 06/25/21 2041 06/26/21 0743 06/26/21 1137  GLUCAP 124* 102* 96 168* 123*   Lipid Profile: No results for input(s): CHOL, HDL, LDLCALC, TRIG, CHOLHDL, LDLDIRECT in the last 72 hours. Thyroid Function Tests: No results for input(s): TSH, T4TOTAL, FREET4, T3FREE, THYROIDAB in the last 72 hours. Anemia Panel: No results for input(s): VITAMINB12, FOLATE, FERRITIN, TIBC, IRON, RETICCTPCT in the last 72 hours. Sepsis Labs: No results for input(s): PROCALCITON, LATICACIDVEN in the last 168 hours.  Recent Results (from the past 240  hour(s))  Resp Panel by RT-PCR (Flu A&B, Covid) Nasopharyngeal Swab     Status: None   Collection Time: 06/18/21  8:27 PM   Specimen: Nasopharyngeal Swab; Nasopharyngeal(NP) swabs in vial transport medium  Result Value Ref Range Status   SARS Coronavirus 2 by RT PCR NEGATIVE NEGATIVE Final    Comment: (NOTE) SARS-CoV-2 target nucleic acids are NOT DETECTED.  The SARS-CoV-2 RNA is generally detectable in upper respiratory specimens during the acute phase of infection. The lowest concentration of SARS-CoV-2 viral copies this assay can detect is 138 copies/mL. A negative result does not preclude SARS-Cov-2 infection and should not be used as the sole basis for treatment or other patient management decisions. A negative result may occur with  improper specimen collection/handling, submission of specimen other than nasopharyngeal swab, presence of viral mutation(s) within the areas targeted by this assay, and inadequate number of viral copies(<138 copies/mL). A negative result must be combined with clinical observations, patient history, and epidemiological information. The expected result is Negative.  Fact Sheet for Patients:  EntrepreneurPulse.com.au  Fact Sheet for Healthcare Providers:  IncredibleEmployment.be  This test is no t yet approved or cleared by the [place] and  has been authorized  for detection and/or diagnosis of SARS-CoV-2 by FDA under an Emergency Use Authorization (EUA). This EUA will remain  in effect (meaning this test can be used) for the duration of the COVID-19 declaration under Section 564(b)(1) of the Act, 21 U.S.C.section 360bbb-3(b)(1), unless the authorization is terminated  or revoked sooner.       Influenza A by PCR NEGATIVE NEGATIVE Final   Influenza B by PCR NEGATIVE NEGATIVE Final    Comment: (NOTE) The Xpert Xpress SARS-CoV-2/FLU/RSV plus assay is intended as an aid in the diagnosis of influenza from  Nasopharyngeal swab specimens and should not be used as a sole basis for treatment. Nasal washings and aspirates are unacceptable for Xpert Xpress SARS-CoV-2/FLU/RSV testing.  Fact Sheet for Patients: EntrepreneurPulse.com.au  Fact Sheet for Healthcare Providers: IncredibleEmployment.be  This test is not yet approved or cleared by the [place] FDA and has been authorized for detection and/or diagnosis of SARS-CoV-2 by FDA under an Emergency Use Authorization (EUA). This EUA will remain in effect (meaning this test can be used) for the duration of the COVID-19 declaration under Section 564(b)(1) of the Act, 21 U.S.C. section 360bbb-3(b)(1), unless the authorization is terminated or revoked.  Performed at [facility], [address], [place], [place] [address]          Radiology Studies: No results found.      Scheduled Meds:  amLODipine  2.5 mg Oral Daily   atorvastatin  20 mg Oral Daily   citalopram  20 mg Oral Daily   enoxaparin (LOVENOX) injection  30 mg Subcutaneous Q24H   furosemide  20 mg Oral Daily   insulin aspart  0-15 Units Subcutaneous TID WC   insulin aspart  0-5 Units Subcutaneous QHS   OLANZapine  2.5 mg Oral QHS   pantoprazole  40 mg Oral Daily   sodium chloride flush  3 mL Intravenous Q12H   Vitamin D (Ergocalciferol)  50,000 Units Oral Weekly   Continuous Infusions:   LOS: 7 days    Time spent: 25 minutes    Desma Maxim, MD [facility]  If 7PM-7AM, please contact night-coverage 06/26/2021, 4:15 PM

## 2021-06-26 NOTE — Progress Notes (Signed)
Mobility Specialist - Progress Note   06/26/21 1545  Mobility  Activity Transferred:  Bed to chair  Level of Assistance Standby assist, set-up cues, supervision of patient - no hands on  Assistive Device Front wheel walker  Distance Ambulated (ft) 2 ft  Mobility Out of bed to chair with meals  Mobility Response Tolerated well  Mobility performed by Mobility specialist  $Mobility charge 1 Mobility    Pt sleeping in bed upon arrival. Awakened by gentle touch/voice. Pt initially thinks mobility tech is her niece, but is self-reoriented as we've worked together previous sessions. Pt lunch tray was placed in front of pt, but untouched as she states she's not hungry. MinA to sit EOB, no dizziness. Pt transferred to recliner with alarm set and needs in reach.   Revisited pt later this afternoon, pt more awake and alert. Still confused however. Asked pt if she would like to return to bed, pt politely declines. Now sitting up eating meal.    Kathee Delton Mobility Specialist 06/26/21, 3:59 PM

## 2021-06-27 DIAGNOSIS — R55 Syncope and collapse: Secondary | ICD-10-CM | POA: Diagnosis not present

## 2021-06-27 LAB — BASIC METABOLIC PANEL
Anion gap: 8 (ref 5–15)
BUN: 56 mg/dL — ABNORMAL HIGH (ref 8–23)
CO2: 28 mmol/L (ref 22–32)
Calcium: 9.3 mg/dL (ref 8.9–10.3)
Chloride: 98 mmol/L (ref 98–111)
Creatinine, Ser: 2.25 mg/dL — ABNORMAL HIGH (ref 0.44–1.00)
GFR, Estimated: 20 mL/min — ABNORMAL LOW (ref >60)
Glucose, Bld: 114 mg/dL — ABNORMAL HIGH (ref 70–99)
Potassium: 4.5 mmol/L (ref 3.5–5.1)
Sodium: 134 mmol/L — ABNORMAL LOW (ref 135–145)

## 2021-06-27 LAB — CBC
HCT: 39.5 % (ref 36.0–46.0)
Hemoglobin: 12.7 g/dL (ref 12.0–15.0)
MCH: 31.1 pg (ref 26.0–34.0)
MCHC: 32.2 g/dL (ref 30.0–36.0)
MCV: 96.8 fL (ref 80.0–100.0)
Platelets: 146 10*3/uL — ABNORMAL LOW (ref 150–400)
RBC: 4.08 MIL/uL (ref 3.87–5.11)
RDW: 12.9 % (ref 11.5–15.5)
WBC: 9.1 10*3/uL (ref 4.0–10.5)
nRBC: 0 % (ref 0.0–0.2)

## 2021-06-27 LAB — GLUCOSE, CAPILLARY
Glucose-Capillary: 110 mg/dL — ABNORMAL HIGH (ref 70–99)
Glucose-Capillary: 197 mg/dL — ABNORMAL HIGH (ref 70–99)
Glucose-Capillary: 102 mg/dL — ABNORMAL HIGH (ref 70–99)

## 2021-06-27 NOTE — Progress Notes (Signed)
Mobility Specialist - Progress Note   06/27/21 1400  Mobility  Activity Ambulated in room  Level of Assistance Standby assist, set-up cues, supervision of patient - no hands on  Assistive Device Front wheel walker;None  Distance Ambulated (ft) 20 ft  Mobility Ambulated with assistance in room  Mobility Response Tolerated well  Mobility performed by Mobility specialist  $Mobility charge 1 Mobility    Pt sitting EOB on arrival, utilizing RA. Pt ambulated 20' in room, return distance without AD---HHA. No LOB. Denied dizziness. Denied SOB. HR peaked at 133 bpm with activity, O2 maintaining high 90s. Pt left in recliner with alarm set, needs in reach.    Kathee Delton Mobility Specialist 06/27/21, 2:24 PM

## 2021-06-27 NOTE — Progress Notes (Signed)
PROGRESS NOTE    [name] [name]  [id number] DOB: 04/23/1930 DOA: 06/18/2021 PCP: Beckie Salts, MD  Brief Narrative:  87 year old female with extensive past medical history and psychiatric history recently treated in the ED for psychosis in March 2022 was brought to the ED following a possible syncopal event with collapse.  Also per documentation the patient has been exhibiting signs of either dementia, delirium or psychosis.  Granddaughter apparently unable to care for her.  I am unable to reach the granddaughter via phone.  Bilateral lower extremity duplex negative for DVT.  Echocardiogram EF 55%  Assessment & Plan:   Principal Problem:   Syncope Active Problems:   HTN (hypertension)   CKD (chronic kidney disease), stage IV (HCC)   Hyperglycemia due to type 2 diabetes mellitus (HCC)   Acute metabolic encephalopathy   Major depression with psychotic features ([place])   Prolonged QT interval  Syncope with collapse History of presyncope secondary to orthostatic hypotension in July 2021 Apparent fall on occasion at home, circumstances unclear CT imaging survey reassuring VQ scan with intermediate probability for PE Low clinical suspicion consider hemodynamic stability Bilateral lower extremity Doppler negative TTE reassuring Plan: Therapy evaluations, recommendations upgraded to skilled nursing facility Fall precautions Medically ready for discharge at this time [facility] working on disposition plan Ideally will need long-term care facility TOC continue to follow for bed offers TOC reached out to care patrol to assist in LTC placement  History of chronic diastolic congestive heart failure Appears euvolemic PTA Lasix daily  Type 2 diabetes mellitus with hyperglycemia Here glucose wnl. Have stopped SSI and started low dose glimepiride 1 mg daily, monitor fasting sugars  Essential hypertension Hold home amlodipine of 2.5, bps here have been mostly wnl  Chronic kidney disease stage  IV Creatinine baseline  History of major depression with psychotic features Patient with thoughts her granddaughter try to harm her.  Similar to previous event in March Currently no suicidal or homicidal ideation Has had intermittent agitation/delirium while in house Plan: Continue Zyprexa and citalopram Low-dose IM/p.o. Haldol as needed for agitation Trazodone as needed sleep No obvious indication for inpatient psychiatric consult   DVT prophylaxis: heparin Code Status: Full Family Communication: None today.  Unable to reach granddaughter, no answer when called 8/18 Disposition Plan: Status is: Inpatient  Remains inpatient appropriate because:Unsafe d/c plan  Dispo: The patient is from: Home              Anticipated d/c is to:  Long-term care facility              Patient currently is medically stable to d/c.   Difficult to place patient No  No acute medical issues require continued hospitalization.  Medically ready at this time however we are lacking a safe disposition plan.     Level of care: Med-Surg  Consultants:  None  Procedures:  None  Antimicrobials:  None   Subjective: Seen and examined.  Resting comfortably in bed.  No visible distress.  No complaints  Objective: Vitals:   06/26/21 2115 06/27/21 0448 06/27/21 0557 06/27/21 0728  BP: 92/70  127/82 (!) 145/83  Pulse: 85  72 86  Resp: 17  17   Temp: 98.2 F (36.8 C)  (!) 97.4 F (36.3 C) 98.3 F (36.8 C)  TempSrc: Oral  Oral   SpO2: 99%  98% 98%  Weight:  85.1 kg    Height:        Intake/Output Summary (Last 24 hours) at 06/27/2021 1209 Last  data filed at 06/27/2021 0900 Gross per 24 hour  Intake 120 ml  Output 600 ml  Net -480 ml   Filed Weights   06/25/21 0500 06/26/21 0500 06/27/21 0448  Weight: 83.9 kg 82.6 kg 85.1 kg    Examination:  General exam: Appears calm and comfortable  Respiratory system: Clear to auscultation. Respiratory effort normal. Cardiovascular system: S1 & S2  heard, RRR. No JVD, murmurs, rubs, gallops or clicks. No pedal edema. Gastrointestinal system: Abdomen is nondistended, soft and nontender. No organomegaly or masses felt. Normal bowel sounds heard. Central nervous system: Alert, oriented to person and place.  No focal deficits Extremities: Symmetric 5 x 5 power. Skin: No rashes, lesions or ulcers Psychiatry: Judgement and insight appear normal. Mood & affect appropriate.     Data Reviewed: I have personally reviewed following labs and imaging studies  CBC: Recent Labs  Lab 06/24/21 0511 06/27/21 0546  WBC 10.1 9.1  NEUTROABS 5.8  --   HGB 12.1 12.7  HCT 38.1 39.5  MCV 95.0 96.8  PLT 154 123456*   Basic Metabolic Panel: Recent Labs  Lab 06/24/21 0511 06/27/21 0546  NA 136 134*  K 4.3 4.5  CL 97* 98  CO2 26 28  GLUCOSE 152* 114*  BUN 65* 56*  CREATININE 2.67* 2.25*  CALCIUM 9.3 9.3   GFR: Estimated Creatinine Clearance: 20.5 mL/min (A) (by C-G formula based on SCr of 2.25 mg/dL (H)). Liver Function Tests: No results for input(s): AST, ALT, ALKPHOS, BILITOT, PROT, ALBUMIN in the last 168 hours.  No results for input(s): LIPASE, AMYLASE in the last 168 hours. No results for input(s): AMMONIA in the last 168 hours. Coagulation Profile: No results for input(s): INR, PROTIME in the last 168 hours. Cardiac Enzymes: No results for input(s): CKTOTAL, CKMB, CKMBINDEX, TROPONINI in the last 168 hours. BNP (last 3 results) No results for input(s): PROBNP in the last 8760 hours. HbA1C: No results for input(s): HGBA1C in the last 72 hours.  CBG: Recent Labs  Lab 06/26/21 1137 06/26/21 1638 06/26/21 2112 06/27/21 0728 06/27/21 1123  GLUCAP 123* 84 109* 110* 197*   Lipid Profile: No results for input(s): CHOL, HDL, LDLCALC, TRIG, CHOLHDL, LDLDIRECT in the last 72 hours. Thyroid Function Tests: No results for input(s): TSH, T4TOTAL, FREET4, T3FREE, THYROIDAB in the last 72 hours. Anemia Panel: No results for input(s):  VITAMINB12, FOLATE, FERRITIN, TIBC, IRON, RETICCTPCT in the last 72 hours. Sepsis Labs: No results for input(s): PROCALCITON, LATICACIDVEN in the last 168 hours.  Recent Results (from the past 240 hour(s))  Resp Panel by RT-PCR (Flu A&B, Covid) Nasopharyngeal Swab     Status: None   Collection Time: 06/18/21  8:27 PM   Specimen: Nasopharyngeal Swab; Nasopharyngeal(NP) swabs in vial transport medium  Result Value Ref Range Status   SARS Coronavirus 2 by RT PCR NEGATIVE NEGATIVE Final    Comment: (NOTE) SARS-CoV-2 target nucleic acids are NOT DETECTED.  The SARS-CoV-2 RNA is generally detectable in upper respiratory specimens during the acute phase of infection. The lowest concentration of SARS-CoV-2 viral copies this assay can detect is 138 copies/mL. A negative result does not preclude SARS-Cov-2 infection and should not be used as the sole basis for treatment or other patient management decisions. A negative result may occur with  improper specimen collection/handling, submission of specimen other than nasopharyngeal swab, presence of viral mutation(s) within the areas targeted by this assay, and inadequate number of viral copies(<138 copies/mL). A negative result must be combined with clinical observations,  patient history, and epidemiological information. The expected result is Negative.  Fact Sheet for Patients:  EntrepreneurPulse.com.au  Fact Sheet for Healthcare Providers:  IncredibleEmployment.be  This test is no t yet approved or cleared by the [place] FDA and  has been authorized for detection and/or diagnosis of SARS-CoV-2 by FDA under an Emergency Use Authorization (EUA). This EUA will remain  in effect (meaning this test can be used) for the duration of the COVID-19 declaration under Section 564(b)(1) of the Act, 21 U.S.C.section 360bbb-3(b)(1), unless the authorization is terminated  or revoked sooner.       Influenza A  by PCR NEGATIVE NEGATIVE Final   Influenza B by PCR NEGATIVE NEGATIVE Final    Comment: (NOTE) The Xpert Xpress SARS-CoV-2/FLU/RSV plus assay is intended as an aid in the diagnosis of influenza from Nasopharyngeal swab specimens and should not be used as a sole basis for treatment. Nasal washings and aspirates are unacceptable for Xpert Xpress SARS-CoV-2/FLU/RSV testing.  Fact Sheet for Patients: EntrepreneurPulse.com.au  Fact Sheet for Healthcare Providers: IncredibleEmployment.be  This test is not yet approved or cleared by the [place] FDA and has been authorized for detection and/or diagnosis of SARS-CoV-2 by FDA under an Emergency Use Authorization (EUA). This EUA will remain in effect (meaning this test can be used) for the duration of the COVID-19 declaration under Section 564(b)(1) of the Act, 21 U.S.C. section 360bbb-3(b)(1), unless the authorization is terminated or revoked.  Performed at [facility], [address] [place], [place] [address]          Radiology Studies: No results found.      Scheduled Meds:  atorvastatin  20 mg Oral Daily   citalopram  20 mg Oral Daily   furosemide  20 mg Oral Daily   glimepiride  1 mg Oral Q breakfast   heparin  5,000 Units Subcutaneous Q8H   OLANZapine  2.5 mg Oral QHS   pantoprazole  40 mg Oral Daily   sodium chloride flush  3 mL Intravenous Q12H   Vitamin D (Ergocalciferol)  50,000 Units Oral Weekly   Continuous Infusions:   LOS: 8 days    Time spent: 20 minutes    Desma Maxim, MD [facility]  If 7PM-7AM, please contact night-coverage 06/27/2021, 12:09 PM

## 2021-06-27 NOTE — TOC Progression Note (Addendum)
Transition of Care [facility]) - Progression Note    Patient Details  Name: [name] [name] MRN: [id number] Date of Birth: 03-24-1930  Transition of Care [facility]) CM/SW [place], LCSW Phone Number: 06/27/2021, 10:12 AM  Clinical Narrative:   [employer] representative said Dustin Flock is confirming financials.  2:23 pm: Referral sent to [facility] per [employer] request.  Expected Discharge Plan and Services                                                 Social Determinants of Health (SDOH) Interventions    Readmission Risk Interventions No flowsheet data found.

## 2021-06-27 NOTE — Progress Notes (Signed)
Occupational Therapy Treatment Patient Details Name: Tirza Mauler MRN: [id number] DOB: [birth year] Today's Date: 06/27/2021    History of present illness Sahaana Tritten is an 83yoF who comes to [facility] on 06/18/21 after syncope and fall at home. Pt arrives wth confusion. Per chart, pt was seen by Psychiatry in March 2022 after some confusion and paranoia regarding family trying to kill her. These reports psych has attributed to pyschosis, have been similar to prior presentation over the last few years per chart. PMH: DM, HTN, dCHF4, depression.   OT comments  Pt seen for OT treatment on this date. Upon arrival to room, pt awake and seated upright in chair. Pt reporting that she just walked with PT, but was agreeable to OT tx. Pt continues to present with decreased balance, activity tolerance, and awareness of deficits, however is steadily making progress towards goals and currently requires MIN GUARD for functional mobility of short household distances with RW and MIN GUARD for standing grooming/hygiene tasks. Pt required a seated rest break following x3 standing grooming tasks (SpO2 92%, HR 102). This author attempted to educate pt on how to implement PLB, however pt with difficulty sequencing PLB. Pt educated pt on importance of pacing and seated rest breaks, and pt verbalized understanding of education provided. Pt left in recliner in no acute distress. Will continue to follow POC. Discharge recommendation remains appropriate.     Follow Up Recommendations  SNF    Equipment Recommendations  Other (comment) (defer to next venue of care)       Precautions / Restrictions Precautions Precautions: Fall Restrictions Weight Bearing Restrictions: No       Mobility Bed Mobility               General bed mobility comments: Not assessed, pt in recliner at beginning and end of session    Transfers Overall transfer level: Needs assistance Equipment used: Rolling walker (2 wheeled) Transfers: Sit  to/from Stand Sit to Stand: Min guard         General transfer comment: No physical assist provided from recliner. Requires increase time to achieve upright posture    Balance Overall balance assessment: Needs assistance Sitting-balance support: No upper extremity supported;Feet supported Sitting balance-Leahy Scale: Good Sitting balance - Comments: steady sitting reaching within BOS   Standing balance support: No upper extremity supported;During functional activity Standing balance-Leahy Scale: Fair Standing balance comment: able to perform standing grooming tasks with UE unsupported with MIN GUARD                           ADL either performed or assessed with clinical judgement   ADL Overall ADL's : Needs assistance/impaired     Grooming: Wash/dry hands;Wash/dry face;Applying deodorant;Min guard;Standing Grooming Details (indicate cue type and reason): MIN GUARD in setting of posterior lean with vision occluded during wash/dry face                             Functional mobility during ADLs: Min guard;Rolling walker        Cognition Arousal/Alertness: Awake/alert Behavior During Therapy: WFL for tasks assessed/performed Overall Cognitive Status: No family/caregiver present to determine baseline cognitive functioning                                 General Comments: Alert and oriented to self, place, and parts of situation.  Pleasant and agreeable throughout. Able to follow multi steps instructions consistently        Exercises Other Exercises Other Exercises: Pt required x1 seated rest break following standing grooming tasks. Attempted to educate pt on how to implement PLB, however pt with difficulty sequencing. Educated pt on importance of pacing and seated rest breaks           Pertinent Vitals/ Pain       Pain Assessment: No/denies pain         Frequency  Min 1X/week        Progress Toward Goals  OT  Goals(current goals can now be found in the care plan section)  Progress towards OT goals: Progressing toward goals  Acute Rehab OT Goals Patient Stated Goal: to get stronger OT Goal Formulation: With patient Time For Goal Achievement: 07/03/21  Plan Discharge plan remains appropriate;Frequency remains appropriate       AM-PAC OT "6 Clicks" Daily Activity     Outcome Measure   Help from another person eating meals?: None Help from another person taking care of personal grooming?: A Little Help from another person toileting, which includes using toliet, bedpan, or urinal?: A Lot Help from another person bathing (including washing, rinsing, drying)?: A Lot Help from another person to put on and taking off regular upper body clothing?: A Little Help from another person to put on and taking off regular lower body clothing?: A Lot 6 Click Score: 16    End of Session Equipment Utilized During Treatment: Gait belt;Rolling walker  OT Visit Diagnosis: Unsteadiness on feet (R26.81);Muscle weakness (generalized) (M62.81)   Activity Tolerance Patient tolerated treatment well   Patient Left in chair;with call bell/phone within reach;with chair alarm set   Nurse Communication Mobility status        Time: 1430-1453 OT Time Calculation (min): 23 min  Charges: OT General Charges $OT Visit: 1 Visit OT Treatments $Self Care/Home Management : 23-37 mins  Fredirick Maudlin, [place]

## 2021-06-27 NOTE — Progress Notes (Signed)
Physical Therapy Treatment Patient Details Name: [name] [name] MRN: [id number] DOB: 1930-01-17 Today's Date: 06/27/2021    History of Present Illness [name] [name] is an 76yoF who comes to [facility] on 06/18/21 after syncope and fall at home. Pt arrives wth confusion. Per chart, pt was seen by Psychiatry in March 2022 after some confusion and paranoia regarding family trying to kill her. These reports psych has attributed to pyschosis, have been similar to prior presentation over the last few years per chart. PMH: DM, HTN, dCHF4, depression.    PT Comments    Patient agreeable to PT with encouragement. Patient was able to get out of bed and ambulate a short distance with rolling walker with assistance. Cues for safety and technique with mobility. Patient fatigued with activity with Sp02 in the 90's with out of bed activity on room air. Patient would benefit from continued PT to maximize independence. SNF is recommended at discharge.    Follow Up Recommendations  SNF     Equipment Recommendations  Rolling walker with 5" wheels    Recommendations for Other Services       Precautions / Restrictions Precautions Precautions: Fall Restrictions Weight Bearing Restrictions: No    Mobility  Bed Mobility Overal bed mobility: Needs Assistance Bed Mobility: Supine to Sit     Supine to sit: Min guard;HOB elevated Sit to supine: Min assist;HOB elevated   General bed mobility comments: assistance for LE support to return to bed. verbal cues for safety and technique. increased time required to complete tasks    Transfers Overall transfer level: Needs assistance Equipment used: Rolling walker (2 wheeled) Transfers: Sit to/from Stand Sit to Stand: Min assist         General transfer comment: Min A for standing from bed and from bed side commode. verbal cues for hand placement for safety with sitting  Ambulation/Gait Ambulation/Gait assistance: Min assist;Min guard Gait Distance (Feet): 30  Feet Assistive device: Rolling walker (2 wheeled) Gait Pattern/deviations: Step-through pattern;Narrow base of support Gait velocity: decreased   General Gait Details: verbal cues for technique and safety using rolling walker for support. patient fatigued with activity. Sp02 in the 90's with activity on room air   Stairs             Wheelchair Mobility    Modified Rankin (Stroke Patients Only)       Balance Overall balance assessment: Needs assistance Sitting-balance support: No upper extremity supported;Feet supported Sitting balance-Leahy Scale: Good     Standing balance support: Bilateral upper extremity supported Standing balance-Leahy Scale: Fair Standing balance comment: relying on rolling walker for support in standing                            Cognition Arousal/Alertness: Awake/alert Behavior During Therapy: WFL for tasks assessed/performed Overall Cognitive Status: No family/caregiver present to determine baseline cognitive functioning                                 General Comments: occasional tangential speech. patient able to follow single step commands consistently.      Exercises      General Comments        Pertinent Vitals/Pain Pain Assessment: No/denies pain    Home Living                      Prior Function  PT Goals (current goals can now be found in the care plan section) Acute Rehab PT Goals Patient Stated Goal: to get stronger PT Goal Formulation: With patient Time For Goal Achievement: 07/04/21 Progress towards PT goals: Progressing toward goals    Frequency    Min 2X/week      PT Plan Current plan remains appropriate    Co-evaluation              AM-PAC PT "6 Clicks" Mobility   Outcome Measure  Help needed turning from your back to your side while in a flat bed without using bedrails?: A Little Help needed moving from lying on your back to sitting on the side  of a flat bed without using bedrails?: A Lot Help needed moving to and from a bed to a chair (including a wheelchair)?: A Little Help needed standing up from a chair using your arms (e.g., wheelchair or bedside chair)?: A Little Help needed to walk in hospital room?: A Little Help needed climbing 3-5 steps with a railing? : A Lot 6 Click Score: 16    End of Session Equipment Utilized During Treatment: Gait belt Activity Tolerance: Patient tolerated treatment well Patient left: in bed;with call bell/phone within reach;with bed alarm set Nurse Communication:  (white board up to date with mobility status) PT Visit Diagnosis: Other abnormalities of gait and mobility (R26.89);Muscle weakness (generalized) (M62.81);History of falling (Z91.81);Difficulty in walking, not elsewhere classified (R26.2)     Time: [id number] PT Time Calculation (min) (ACUTE ONLY): 17 min  Charges:  $Therapeutic Activity: 8-22 mins                     Minna Merritts, PT, MPT    Percell Locus 06/27/2021, 12:57 PM

## 2021-06-28 DIAGNOSIS — R55 Syncope and collapse: Secondary | ICD-10-CM | POA: Diagnosis not present

## 2021-06-28 LAB — GLUCOSE, CAPILLARY
Glucose-Capillary: 132 mg/dL — ABNORMAL HIGH (ref 70–99)
Glucose-Capillary: 124 mg/dL — ABNORMAL HIGH (ref 70–99)
Glucose-Capillary: 136 mg/dL — ABNORMAL HIGH (ref 70–99)
Glucose-Capillary: 84 mg/dL (ref 70–99)
Glucose-Capillary: 84 mg/dL (ref 70–99)

## 2021-06-28 LAB — GLUCOSE, RANDOM: Glucose, Bld: 120 mg/dL — ABNORMAL HIGH (ref 70–99)

## 2021-06-28 NOTE — Progress Notes (Signed)
Physical Therapy Treatment Patient Details Name: [name] [name] MRN: [id number] DOB: 06-30-1930 Today's Date: 06/28/2021    History of Present Illness [name] [name] is an 26yoF who comes to [facility] on 06/18/21 after syncope and fall at home. Pt arrives wth confusion. Per chart, pt was seen by Psychiatry in March 2022 after some confusion and paranoia regarding family trying to kill her. These reports psych has attributed to pyschosis, have been similar to prior presentation over the last few years per chart. PMH: DM, HTN, dCHF4, depression.    PT Comments    Pt received sitting in recliner, agreeable to therapy. She states she is "wonderful" and that she is "getting married at 4pm today." Pt required increased assist to stand from recliner however decreased back to CGA to stand from [facility] later in session. 54f of ambulation x2 bouts was performed - mild gait and postural deviations including genu valgus, pronation and eversion bilaterally. Decreased stride length and gait velocity. Decreased steadiness on feet requiring external stabilization of RW or counter top in both static and dynamic standing. Pt performed STS transfer, ambulatory toilet transfer, toileting with self-performed pericare and hand washing. She reported fatigue after hand washing. She demo decreased functional endurance, decreased stability in standing and inconsistent performance of STS transfers. Would benefit from skilled PT to address above deficits and promote optimal return to PLOF.   Follow Up Recommendations  SNF     Equipment Recommendations  Rolling walker with 5" wheels    Recommendations for Other Services       Precautions / Restrictions Precautions Precautions: Fall Restrictions Weight Bearing Restrictions: No    Mobility  Bed Mobility               General bed mobility comments: Not assessed, pt in recliner at beginning and end of session    Transfers Overall transfer level: Needs assistance Equipment  used: Rolling walker (2 wheeled) Transfers: Sit to/from Stand Sit to Stand: Mod assist         General transfer comment: MOD A to lift from recliner, CGA to stand from [facility] Both using RW, increased time required to achieve upright posture  Ambulation/Gait Ambulation/Gait assistance: Min guard Gait Distance (Feet): 25 Feet Assistive device: Rolling walker (2 wheeled) Gait Pattern/deviations: Step-through pattern;Narrow base of support Gait velocity: decreased   General Gait Details: 232fx 2 reps. Knee valgus, pronation and eversion bilaterally. VC for safety and obstacle navigation using RW. Pt reported fatigue during second bout,   Stairs             Wheelchair Mobility    Modified Rankin (Stroke Patients Only)       Balance Overall balance assessment: Needs assistance Sitting-balance support: No upper extremity supported;Feet supported Sitting balance-Leahy Scale: Good Sitting balance - Comments: steady sitting reaching within BOS   Standing balance support: During functional activity;No upper extremity supported;Bilateral upper extremity supported Standing balance-Leahy Scale: Fair Standing balance comment: BUE support on RW with CGA during ambulation. No UE support during hand washing - pt leans forward onto sink counter for support.                            Cognition Arousal/Alertness: Awake/alert Behavior During Therapy: WFL for tasks assessed/performed Overall Cognitive Status: No family/caregiver present to determine baseline cognitive functioning  General Comments: Pleasant and agreeable throughout. Able to follow multi steps instructions consistently. Pt reporting she's "getting married" at 4pm today.      Exercises      General Comments        Pertinent Vitals/Pain Pain Assessment: No/denies pain    Home Living                      Prior Function            PT Goals  (current goals can now be found in the care plan section) Acute Rehab PT Goals Patient Stated Goal: none stated    Frequency    Min 2X/week      PT Plan      Co-evaluation              AM-PAC PT "6 Clicks" Mobility   Outcome Measure  Help needed turning from your back to your side while in a flat bed without using bedrails?: A Little Help needed moving from lying on your back to sitting on the side of a flat bed without using bedrails?: A Lot Help needed moving to and from a bed to a chair (including a wheelchair)?: A Little Help needed standing up from a chair using your arms (e.g., wheelchair or bedside chair)?: A Little Help needed to walk in hospital room?: A Little Help needed climbing 3-5 steps with a railing? : A Lot 6 Click Score: 16    End of Session Equipment Utilized During Treatment: Gait belt Activity Tolerance: Patient tolerated treatment well;Patient limited by fatigue Patient left: with call bell/phone within reach;in chair;with chair alarm set Nurse Communication: Mobility status;Other (comment) (toileting performed) PT Visit Diagnosis: Other abnormalities of gait and mobility (R26.89);Muscle weakness (generalized) (M62.81);History of falling (Z91.81);Difficulty in walking, not elsewhere classified (R26.2)     Time: [id number] PT Time Calculation (min) (ACUTE ONLY): 23 min  Charges:  $Therapeutic Activity: 23-37 mins             Patrina Levering PT, DPT 06/28/21 3:06 PM [id number]

## 2021-06-28 NOTE — Progress Notes (Signed)
Mobility Specialist - Progress Note   06/28/21 1222  Mobility  Activity Transferred to/from [facility];Transferred:  Bed to chair  Range of Motion/Exercises Active  Level of Assistance Standby assist, set-up cues, supervision of patient - no hands on  Assistive Device Front wheel walker  Distance Ambulated (ft) 10 ft  Mobility Out of bed for toileting;Out of bed to chair with meals;Ambulated with assistance in room  Mobility Response Tolerated well  Mobility performed by Mobility specialist  $Mobility charge 1 Mobility    Pt sleeping in bed upon arrival, utilizing RA. Pt awakened to voice, "I don't know why I been sleeping so much lately." Pt participated in UB/LB hygiene care with set-up assist from mobility. Pt stood to RW and ambulated to [facility] for urinal output prior to seated bathing. Supervision to stand from [facility]. MinA to don socks--independent for gown change. Pt able to perform forward/retro ambulation with RW and min-guard before transferring to recliner, no LOB. VC for slow, controlled pacing when coming into seated position. Pt left in recliner with all needs in reach and alarm set.    Kathee Delton Mobility Specialist 06/28/21, 12:32 PM
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
## 2021-06-29 DIAGNOSIS — R55 Syncope and collapse: Secondary | ICD-10-CM | POA: Diagnosis not present

## 2021-06-29 LAB — BASIC METABOLIC PANEL
Anion gap: 10 (ref 5–15)
BUN: 57 mg/dL — ABNORMAL HIGH (ref 8–23)
CO2: 26 mmol/L (ref 22–32)
Calcium: 9.5 mg/dL (ref 8.9–10.3)
Chloride: 96 mmol/L — ABNORMAL LOW (ref 98–111)
Creatinine, Ser: 2.19 mg/dL — ABNORMAL HIGH (ref 0.44–1.00)
GFR, Estimated: 21 mL/min — ABNORMAL LOW (ref >60)
Glucose, Bld: 66 mg/dL — ABNORMAL LOW (ref 70–99)
Potassium: 4.8 mmol/L (ref 3.5–5.1)
Sodium: 132 mmol/L — ABNORMAL LOW (ref 135–145)

## 2021-06-29 LAB — CBC
HCT: 39.5 % (ref 36.0–46.0)
Hemoglobin: 12.1 g/dL (ref 12.0–15.0)
MCH: 29.5 pg (ref 26.0–34.0)
MCHC: 30.6 g/dL (ref 30.0–36.0)
MCV: 96.3 fL (ref 80.0–100.0)
Platelets: 129 10*3/uL — ABNORMAL LOW (ref 150–400)
RBC: 4.1 MIL/uL (ref 3.87–5.11)
RDW: 12.8 % (ref 11.5–15.5)
WBC: 8.4 10*3/uL (ref 4.0–10.5)
nRBC: 0 % (ref 0.0–0.2)

## 2021-06-29 LAB — GLUCOSE, CAPILLARY
Glucose-Capillary: 72 mg/dL (ref 70–99)
Glucose-Capillary: 146 mg/dL — ABNORMAL HIGH (ref 70–99)
Glucose-Capillary: 102 mg/dL — ABNORMAL HIGH (ref 70–99)

## 2021-06-29 LAB — GLUCOSE, RANDOM: Glucose, Bld: 78 mg/dL (ref 70–99)

## 2021-06-29 NOTE — Progress Notes (Signed)
Patient IV access lost overnight (OK to keep out) and morning glimpiride held for CBG of 72. MD Wouk aware.

## 2021-06-29 NOTE — Progress Notes (Signed)
Mobility Specialist - Progress Note   06/29/21 1500  Mobility  Activity Transferred:  Bed to chair  Level of Assistance Standby assist, set-up cues, supervision of patient - no hands on  Assistive Device Front wheel walker  Distance Ambulated (ft) 5 ft  Mobility Ambulated with assistance in room  Mobility Response Tolerated well  Mobility performed by Mobility specialist  $Mobility charge 1 Mobility    Pt lying in bed upon arrival, utilizing RA. Pt transferred to chair with supervision and RW. No LOB.    Kathee Delton Mobility Specialist 06/29/21, 3:40 PM

## 2021-06-29 NOTE — Progress Notes (Signed)
PROGRESS NOTE    [name] [name]  [id number] DOB: 1930-06-09 DOA: 06/18/2021 PCP: Beckie Salts, MD  Brief Narrative:  87 year old female with extensive past medical history and psychiatric history recently treated in the ED for psychosis in March 2022 was brought to the ED following a possible syncopal event with collapse.  Also per documentation the patient has been exhibiting signs of either dementia, delirium or psychosis.  Granddaughter apparently unable to care for her.  I am unable to reach the granddaughter via phone.  Bilateral lower extremity duplex negative for DVT.  Echocardiogram EF 55%  Assessment & Plan:   Principal Problem:   Syncope Active Problems:   HTN (hypertension)   CKD (chronic kidney disease), stage IV (HCC)   Hyperglycemia due to type 2 diabetes mellitus (HCC)   Acute metabolic encephalopathy   Major depression with psychotic features ([place])   Prolonged QT interval  Syncope with collapse History of presyncope secondary to orthostatic hypotension in July 2021 Apparent fall on occasion at home, circumstances unclear CT imaging survey reassuring VQ scan with intermediate probability for PE Low clinical suspicion consider hemodynamic stability Bilateral lower extremity Doppler negative TTE reassuring Plan: Therapy evaluations, recommendations upgraded to skilled nursing facility Fall precautions Medically ready for discharge at this time [facility] working on disposition plan Ideally will need long-term care facility [facility] continue to follow for bed offers TOC reached out to care patrol to assist in LTC placement  History of chronic diastolic congestive heart failure Appears euvolemic PTA Lasix daily  Type 2 diabetes mellitus with hyperglycemia Here glucose wnl. stopped SSI and started low dose glimepiride 1 mg daily, this morning hypoglycemic. Will hold all meds, monitor  Essential hypertension Hold home amlodipine of 2.5, bps here have been mostly  wnl  Chronic kidney disease stage IV Creatinine @ baseline  History of major depression with psychotic features Has had intermittent agitation/delirium while in house, now resolved Plan: Continue Zyprexa and citalopram Trazodone as needed sleep   DVT prophylaxis: heparin Code Status: Full Family Communication: Unable to reach granddaughter until today. Updated her on the plan. She was traveling last week.  Disposition Plan: Status is: Inpatient  Remains inpatient appropriate because:Unsafe d/c plan  Dispo: The patient is from: Home              Anticipated d/c is to:  Long-term care facility              Patient currently is medically stable to d/c.   Difficult to place patient No  No acute medical issues require continued hospitalization.  Medically ready at this time however we are lacking a safe disposition plan.     Level of care: Med-Surg  Consultants:  None  Procedures:  None  Antimicrobials:  None   Subjective: Seen and examined.  Resting comfortably in bed.  No visible distress.  No complaints  Objective: Vitals:   06/28/21 1700 06/28/21 1921 06/29/21 0425 06/29/21 0815  BP: 105/62 127/67 134/64 (!) 146/77  Pulse: 87 97 64 84  Resp: '16 20 16 18  '$ Temp: 98.4 F (36.9 C) 97.8 F (36.6 C) 97.6 F (36.4 C) 97.6 F (36.4 C)  TempSrc: Oral Oral Oral Oral  SpO2: 99% 100% 99% 99%  Weight:      Height:        Intake/Output Summary (Last 24 hours) at 06/29/2021 1404 Last data filed at 06/29/2021 0455 Gross per 24 hour  Intake 480 ml  Output 750 ml  Net -270 ml  Filed Weights   06/25/21 0500 06/26/21 0500 06/27/21 0448  Weight: 83.9 kg 82.6 kg 85.1 kg    Examination:  General exam: Appears calm and comfortable  Respiratory system: Clear to auscultation. Respiratory effort normal. Cardiovascular system: S1 & S2 heard, RRR. No JVD, murmurs, rubs, gallops or clicks. No pedal edema. Gastrointestinal system: Abdomen is nondistended, soft and  nontender. No organomegaly or masses felt. Normal bowel sounds heard. Central nervous system: Alert, oriented to person and place.  No focal deficits Extremities: Symmetric 5 x 5 power. Skin: No rashes, lesions or ulcers Psychiatry: Judgement and insight appear normal. Mood & affect appropriate.     Data Reviewed: I have personally reviewed following labs and imaging studies  CBC: Recent Labs  Lab 06/24/21 0511 06/27/21 0546 06/29/21 0455  WBC 10.1 9.1 8.4  NEUTROABS 5.8  --   --   HGB 12.1 12.7 12.1  HCT 38.1 39.5 39.5  MCV 95.0 96.8 96.3  PLT 154 146* [id number]*   Basic Metabolic Panel: Recent Labs  Lab 06/24/21 0511 06/27/21 0546 06/28/21 0439 06/29/21 0455 06/29/21 0805  NA 136 134*  --  132*  --   K 4.3 4.5  --  4.8  --   CL 97* 98  --  96*  --   CO2 26 28  --  26  --   GLUCOSE 152* 114* 120* 66* 78  BUN 65* 56*  --  57*  --   CREATININE 2.67* 2.25*  --  2.19*  --   CALCIUM 9.3 9.3  --  9.5  --    GFR: Estimated Creatinine Clearance: 21 mL/min (A) (by C-G formula based on SCr of 2.19 mg/dL (H)). Liver Function Tests: No results for input(s): AST, ALT, ALKPHOS, BILITOT, PROT, ALBUMIN in the last 168 hours.  No results for input(s): LIPASE, AMYLASE in the last 168 hours. No results for input(s): AMMONIA in the last 168 hours. Coagulation Profile: No results for input(s): INR, PROTIME in the last 168 hours. Cardiac Enzymes: No results for input(s): CKTOTAL, CKMB, CKMBINDEX, TROPONINI in the last 168 hours. BNP (last 3 results) No results for input(s): PROBNP in the last 8760 hours. HbA1C: No results for input(s): HGBA1C in the last 72 hours.  CBG: Recent Labs  Lab 06/28/21 1134 06/28/21 1633 06/28/21 1730 06/29/21 0814 06/29/21 1155  GLUCAP 136* 84 84 72 146*   Lipid Profile: No results for input(s): CHOL, HDL, LDLCALC, TRIG, CHOLHDL, LDLDIRECT in the last 72 hours. Thyroid Function Tests: No results for input(s): TSH, T4TOTAL, FREET4, T3FREE,  THYROIDAB in the last 72 hours. Anemia Panel: No results for input(s): VITAMINB12, FOLATE, FERRITIN, TIBC, IRON, RETICCTPCT in the last 72 hours. Sepsis Labs: No results for input(s): PROCALCITON, LATICACIDVEN in the last 168 hours.  No results found for this or any previous visit (from the past 240 hour(s)).        Radiology Studies: No results found.      Scheduled Meds:  atorvastatin  20 mg Oral Daily   citalopram  20 mg Oral Daily   furosemide  20 mg Oral Daily   heparin  5,000 Units Subcutaneous Q8H   OLANZapine  2.5 mg Oral QHS   pantoprazole  40 mg Oral Daily   sodium chloride flush  3 mL Intravenous Q12H   Vitamin D (Ergocalciferol)  50,000 Units Oral Weekly   Continuous Infusions:   LOS: 10 days    Time spent: 15 minutes    Desma Maxim, MD [facility]  If 7PM-7AM, please contact night-coverage 06/29/2021, 2:04 PM
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
## 2021-06-30 DIAGNOSIS — R55 Syncope and collapse: Secondary | ICD-10-CM | POA: Diagnosis not present

## 2021-06-30 NOTE — Progress Notes (Signed)
PROGRESS NOTE    [name] [name]  [id number] DOB: [birth year] DOA: 06/18/2021 PCP: Beckie Salts, MD  Brief Narrative:  87 year old female with extensive past medical history and psychiatric history recently treated in the ED for psychosis in March 2022 was brought to the ED following a possible syncopal event with collapse.  Also per documentation the patient has been exhibiting signs of either dementia, delirium or psychosis.  Granddaughter apparently unable to care for her.  I am unable to reach the granddaughter via phone.  Bilateral lower extremity duplex negative for DVT.  Echocardiogram EF 55%  Assessment & Plan:   Principal Problem:   Syncope Active Problems:   HTN (hypertension)   CKD (chronic kidney disease), stage IV (HCC)   Hyperglycemia due to type 2 diabetes mellitus (HCC)   Acute metabolic encephalopathy   Major depression with psychotic features ([place])   Prolonged QT interval  Syncope with collapse History of presyncope secondary to orthostatic hypotension in July 2021 Apparent fall on occasion at home, circumstances unclear CT imaging survey reassuring VQ scan with intermediate probability for PE Low clinical suspicion consider hemodynamic stability Bilateral lower extremity Doppler negative TTE reassuring Plan: Therapy evaluations, recommendations upgraded to skilled nursing facility Fall precautions Medically ready for discharge at this time [facility] working on disposition plan Ideally will need long-term care facility [facility] continue to follow for bed offers TOC reached out to care patrol to assist in LTC placement. Waiting for tomorrow for updates  History of chronic diastolic congestive heart failure Appears euvolemic PTA Lasix daily  Type 2 diabetes mellitus with hyperglycemia Here glucose wnl off meds, monitoring daily, messaged nursing about no fasting glucose this morning  Essential hypertension Hold home amlodipine of 2.5, bps here have been mostly  wnl  Chronic kidney disease stage IV Creatinine @ baseline  History of major depression with psychotic features Has had intermittent agitation/delirium while in house, now resolved Plan: Continue Zyprexa and citalopram Trazodone as needed sleep   DVT prophylaxis: heparin Code Status: Full Family Communication: granddaughter updated telephonically 8/20 Disposition Plan: Status is: Inpatient  Remains inpatient appropriate because:Unsafe d/c plan  Dispo: The patient is from: Home              Anticipated d/c is to:  Long-term care facility              Patient currently is medically stable to d/c.   Difficult to place patient No  No acute medical issues require continued hospitalization.  Medically ready at this time however we are lacking a safe disposition plan.     Level of care: Med-Surg  Consultants:  None  Procedures:  None  Antimicrobials:  None   Subjective: Seen and examined.  Resting comfortably in bed.  No visible distress.  No complaints. Tolerating diet.  Objective: Vitals:   06/29/21 0815 06/29/21 1540 06/29/21 2021 06/30/21 0756  BP: (!) 146/77 126/78 128/65 133/68  Pulse: 84 79 69 72  Resp: '18 18 16 16  '$ Temp: 97.6 F (36.4 C) (!) 97.4 F (36.3 C) 97.6 F (36.4 C) 98.2 F (36.8 C)  TempSrc: Oral Oral Oral Oral  SpO2: 99% 100% 99% 98%  Weight:      Height:        Intake/Output Summary (Last 24 hours) at 06/30/2021 1430 Last data filed at 06/30/2021 0843 Gross per 24 hour  Intake --  Output 1800 ml  Net -1800 ml   Filed Weights   06/25/21 0500 06/26/21 0500 06/27/21 0448  Weight:  83.9 kg 82.6 kg 85.1 kg    Examination:  General exam: Appears calm and comfortable  Respiratory system: Clear to auscultation. Respiratory effort normal. Cardiovascular system: S1 & S2 heard, RRR. No JVD, murmurs, rubs, gallops or clicks. No pedal edema. Gastrointestinal system: Abdomen is nondistended, soft and nontender. No organomegaly or masses felt.  Normal bowel sounds heard. Central nervous system: Alert, oriented to person and place.  No focal deficits Extremities: Symmetric 5 x 5 power. Skin: No rashes, lesions or ulcers Psychiatry: Judgement and insight appear normal. Mood & affect appropriate.     Data Reviewed: I have personally reviewed following labs and imaging studies  CBC: Recent Labs  Lab 06/24/21 0511 06/27/21 0546 06/29/21 0455  WBC 10.1 9.1 8.4  NEUTROABS 5.8  --   --   HGB 12.1 12.7 12.1  HCT 38.1 39.5 39.5  MCV 95.0 96.8 96.3  PLT 154 146* Q000111Q*   Basic Metabolic Panel: Recent Labs  Lab 06/24/21 0511 06/27/21 0546 06/28/21 0439 06/29/21 0455 06/29/21 0805  NA 136 134*  --  132*  --   K 4.3 4.5  --  4.8  --   CL 97* 98  --  96*  --   CO2 26 28  --  26  --   GLUCOSE 152* 114* 120* 66* 78  BUN 65* 56*  --  57*  --   CREATININE 2.67* 2.25*  --  2.19*  --   CALCIUM 9.3 9.3  --  9.5  --    GFR: Estimated Creatinine Clearance: 21 mL/min (A) (by C-G formula based on SCr of 2.19 mg/dL (H)). Liver Function Tests: No results for input(s): AST, ALT, ALKPHOS, BILITOT, PROT, ALBUMIN in the last 168 hours.  No results for input(s): LIPASE, AMYLASE in the last 168 hours. No results for input(s): AMMONIA in the last 168 hours. Coagulation Profile: No results for input(s): INR, PROTIME in the last 168 hours. Cardiac Enzymes: No results for input(s): CKTOTAL, CKMB, CKMBINDEX, TROPONINI in the last 168 hours. BNP (last 3 results) No results for input(s): PROBNP in the last 8760 hours. HbA1C: No results for input(s): HGBA1C in the last 72 hours.  CBG: Recent Labs  Lab 06/28/21 1633 06/28/21 1730 06/29/21 0814 06/29/21 1155 06/29/21 1629  GLUCAP 84 84 72 146* 102*   Lipid Profile: No results for input(s): CHOL, HDL, LDLCALC, TRIG, CHOLHDL, LDLDIRECT in the last 72 hours. Thyroid Function Tests: No results for input(s): TSH, T4TOTAL, FREET4, T3FREE, THYROIDAB in the last 72 hours. Anemia Panel: No  results for input(s): VITAMINB12, FOLATE, FERRITIN, TIBC, IRON, RETICCTPCT in the last 72 hours. Sepsis Labs: No results for input(s): PROCALCITON, LATICACIDVEN in the last 168 hours.  No results found for this or any previous visit (from the past 240 hour(s)).        Radiology Studies: No results found.      Scheduled Meds:  atorvastatin  20 mg Oral Daily   citalopram  20 mg Oral Daily   furosemide  20 mg Oral Daily   heparin  5,000 Units Subcutaneous Q8H   OLANZapine  2.5 mg Oral QHS   pantoprazole  40 mg Oral Daily   sodium chloride flush  3 mL Intravenous Q12H   Vitamin D (Ergocalciferol)  50,000 Units Oral Weekly   Continuous Infusions:   LOS: 11 days    Time spent: 15 minutes    Desma Maxim, MD [facility]  If 7PM-7AM, please contact night-coverage 06/30/2021, 2:30 PM

## 2021-07-01 ENCOUNTER — Encounter: Payer: Self-pay | Admitting: Internal Medicine

## 2021-07-01 DIAGNOSIS — R55 Syncope and collapse: Secondary | ICD-10-CM | POA: Diagnosis not present

## 2021-07-01 LAB — RESP PANEL BY RT-PCR (FLU A&B, COVID) ARPGX2
Influenza A by PCR: NEGATIVE
Influenza B by PCR: NEGATIVE
SARS Coronavirus 2 by RT PCR: NEGATIVE

## 2021-07-01 LAB — BASIC METABOLIC PANEL
Anion gap: 6 (ref 5–15)
BUN: 57 mg/dL — ABNORMAL HIGH (ref 8–23)
CO2: 30 mmol/L (ref 22–32)
Calcium: 9.9 mg/dL (ref 8.9–10.3)
Chloride: 98 mmol/L (ref 98–111)
Creatinine, Ser: 2.37 mg/dL — ABNORMAL HIGH (ref 0.44–1.00)
GFR, Estimated: 19 mL/min — ABNORMAL LOW (ref >60)
Glucose, Bld: 103 mg/dL — ABNORMAL HIGH (ref 70–99)
Potassium: 4.7 mmol/L (ref 3.5–5.1)
Sodium: 134 mmol/L — ABNORMAL LOW (ref 135–145)

## 2021-07-01 LAB — GLUCOSE, CAPILLARY: Glucose-Capillary: 83 mg/dL (ref 70–99)

## 2021-07-01 NOTE — Progress Notes (Signed)
PT Cancellation Note  Patient Details Name: [name] [name] MRN: [id number] DOB: 1930/10/09   Cancelled Treatment:    Reason Eval/Treat Not Completed: Other (comment) Attempted to see pt for PT tx. Pt received asleep in bed with meal tray barely touched. Pt requires verbal conversation then awakens but declines participation in mobility tasks & pt closes eyes after speaking with PT. Will re-attempt as able.  Lavone Nian, PT, DPT 07/01/21, 1:53 PM    [name] 07/01/2021, 1:52 PM

## 2021-07-01 NOTE — TOC Progression Note (Signed)
Transition of Care [facility]) - Progression Note    Patient Details  Name: [name] [name] MRN: [id number] Date of Birth: Aug 07, 1930  [facility] CM/SW Contact  Beverly Sessions, RN Phone Number: 07/01/2021, 4:55 PM  Clinical Narrative:     Bed offer received from [facility]. Confirmed with Lavella Lemons at [facility] that they can accept patient tomorrow under her medicaid  Bed offer presented to granddaughter.  She is unsure if they wish to accept the offer. She is aware that if they do not accept the bed the patient would have to return home.  She is to call me back directly with her decision       Expected Discharge Plan and Services                                                 Social Determinants of Health (SDOH) Interventions    Readmission Risk Interventions No flowsheet data found.

## 2021-07-01 NOTE — TOC Progression Note (Addendum)
Transition of Care [facility]) - Progression Note    Patient Details  Name: Mahathi Wiechman MRN: [id number] Date of Birth: [birth year]  Transition of Care [facility]) CM/SW Contact  Beverly Sessions, RN Phone Number: 07/01/2021, 1:46 PM  Clinical Narrative:      Per Andee Poles with [employer]  "Granddaughter will sign over rep payee. Looked in [place] then [place]. [facility], [place] all declined Medicaid. "    TOC has reached out to [place] at [place] [facility], they are going to review  Spoke with Rechemma at [facility] they are to review   Granddaughter updated   Expected Discharge Plan and Services                                                 Social Determinants of Health (SDOH) Interventions    Readmission Risk Interventions No flowsheet data found.

## 2021-07-01 NOTE — Progress Notes (Signed)
PROGRESS NOTE    [name] [name]  [id number] DOB: 08-30-1930 DOA: 06/18/2021 PCP: Beckie Salts, MD  Brief Narrative:  87 year old female with extensive past medical history and psychiatric history recently treated in the ED for psychosis in March 2022 was brought to the ED following a possible syncopal event with collapse.  Also per documentation the patient has been exhibiting signs of either dementia, delirium or psychosis.  Granddaughter apparently unable to care for her.  I am unable to reach the granddaughter via phone.  Bilateral lower extremity duplex negative for DVT.  Echocardiogram EF 55%  Assessment & Plan:   Principal Problem:   Syncope Active Problems:   HTN (hypertension)   CKD (chronic kidney disease), stage IV (HCC)   Hyperglycemia due to type 2 diabetes mellitus (HCC)   Acute metabolic encephalopathy   Major depression with psychotic features ([place])   Prolonged QT interval  Syncope with collapse History of presyncope secondary to orthostatic hypotension in July 2021 Apparent fall on occasion at home, circumstances unclear CT imaging survey reassuring VQ scan with intermediate probability for PE Low clinical suspicion consider hemodynamic stability Bilateral lower extremity Doppler negative TTE reassuring Plan: Therapy evaluations, recommendations upgraded to skilled nursing facility Fall precautions Medically ready for discharge at this time [facility] working on disposition plan Ideally will need long-term care facility [facility] continue to follow for bed offers TOC reached out to care patrol to assist in LTC placement. Waiting to hear back from [facility] today about update  History of chronic diastolic congestive heart failure Appears euvolemic PTA Lasix daily  Type 2 diabetes mellitus with hyperglycemia Here glucose wnl off meds, monitoring daily, messaged nursing about no fasting glucose this morning  Essential hypertension Hold home amlodipine of 2.5, bps here have  been mostly wnl  Chronic kidney disease stage IV Creatinine @ baseline  History of major depression with psychotic features Has had intermittent agitation/delirium while in house, now resolved Plan: Continue Zyprexa and citalopram Trazodone as needed sleep   DVT prophylaxis: heparin Code Status: Full Family Communication: granddaughter updated telephonically 8/20 Disposition Plan: Status is: Inpatient  Remains inpatient appropriate because:Unsafe d/c plan  Dispo: The patient is from: Home              Anticipated d/c is to:  Long-term care facility              Patient currently is medically stable to d/c.   Difficult to place patient No  No acute medical issues require continued hospitalization.  Medically ready at this time however we are lacking a safe disposition plan.     Level of care: Med-Surg  Consultants:  None  Procedures:  None  Antimicrobials:  None   Subjective: Seen and examined.  Resting comfortably in bed.  No visible distress.  No complaints. Tolerating diet.  Objective: Vitals:   06/30/21 1557 06/30/21 1929 07/01/21 0417 07/01/21 0748  BP: 116/72 121/63 (!) 142/80 (!) 142/91  Pulse: 73 79 63 73  Resp: '15 16 18 16  '$ Temp: 98.5 F (36.9 C) 98.2 F (36.8 C) (!) 97.5 F (36.4 C) 97.7 F (36.5 C)  TempSrc: Oral Oral Oral Oral  SpO2: 97% 100% 96% 100%  Weight:   84.7 kg   Height:        Intake/Output Summary (Last 24 hours) at 07/01/2021 1258 Last data filed at 07/01/2021 0900 Gross per 24 hour  Intake 0 ml  Output --  Net 0 ml   Filed Weights   06/26/21 0500  06/27/21 0448 07/01/21 0417  Weight: 82.6 kg 85.1 kg 84.7 kg    Examination:  General exam: Appears calm and comfortable  Respiratory system: Clear to auscultation. Respiratory effort normal. Cardiovascular system: S1 & S2 heard, RRR. No JVD, murmurs, rubs, gallops or clicks. No pedal edema. Gastrointestinal system: Abdomen is nondistended, soft and nontender. No organomegaly  or masses felt. Normal bowel sounds heard. Central nervous system: Alert, oriented to person and place.  No focal deficits Extremities: Symmetric 5 x 5 power. Skin: No rashes, lesions or ulcers Psychiatry: demented, calm    Data Reviewed: I have personally reviewed following labs and imaging studies  CBC: Recent Labs  Lab 06/27/21 0546 06/29/21 0455  WBC 9.1 8.4  HGB 12.7 12.1  HCT 39.5 39.5  MCV 96.8 96.3  PLT 146* Q000111Q*   Basic Metabolic Panel: Recent Labs  Lab 06/27/21 0546 06/28/21 0439 06/29/21 0455 06/29/21 0805 07/01/21 0507  NA 134*  --  132*  --  134*  K 4.5  --  4.8  --  4.7  CL 98  --  96*  --  98  CO2 28  --  26  --  30  GLUCOSE 114* 120* 66* 78 103*  BUN 56*  --  57*  --  57*  CREATININE 2.25*  --  2.19*  --  2.37*  CALCIUM 9.3  --  9.5  --  9.9   GFR: Estimated Creatinine Clearance: 18 mL/min (A) (by C-G formula based on SCr of 2.37 mg/dL (H)). Liver Function Tests: No results for input(s): AST, ALT, ALKPHOS, BILITOT, PROT, ALBUMIN in the last 168 hours.  No results for input(s): LIPASE, AMYLASE in the last 168 hours. No results for input(s): AMMONIA in the last 168 hours. Coagulation Profile: No results for input(s): INR, PROTIME in the last 168 hours. Cardiac Enzymes: No results for input(s): CKTOTAL, CKMB, CKMBINDEX, TROPONINI in the last 168 hours. BNP (last 3 results) No results for input(s): PROBNP in the last 8760 hours. HbA1C: No results for input(s): HGBA1C in the last 72 hours.  CBG: Recent Labs  Lab 06/28/21 1730 06/29/21 0814 06/29/21 1155 06/29/21 1629 07/01/21 0439  GLUCAP 84 72 146* 102* 83   Lipid Profile: No results for input(s): CHOL, HDL, LDLCALC, TRIG, CHOLHDL, LDLDIRECT in the last 72 hours. Thyroid Function Tests: No results for input(s): TSH, T4TOTAL, FREET4, T3FREE, THYROIDAB in the last 72 hours. Anemia Panel: No results for input(s): VITAMINB12, FOLATE, FERRITIN, TIBC, IRON, RETICCTPCT in the last 72  hours. Sepsis Labs: No results for input(s): PROCALCITON, LATICACIDVEN in the last 168 hours.  No results found for this or any previous visit (from the past 240 hour(s)).        Radiology Studies: No results found.      Scheduled Meds:  atorvastatin  20 mg Oral Daily   citalopram  20 mg Oral Daily   furosemide  20 mg Oral Daily   heparin  5,000 Units Subcutaneous Q8H   OLANZapine  2.5 mg Oral QHS   pantoprazole  40 mg Oral Daily   sodium chloride flush  3 mL Intravenous Q12H   Vitamin D (Ergocalciferol)  50,000 Units Oral Weekly   Continuous Infusions:   LOS: 12 days    Time spent: 15 minutes    Desma Maxim, MD [facility]  If 7PM-7AM, please contact night-coverage 07/01/2021, 12:58 PM

## 2021-07-02 DIAGNOSIS — R55 Syncope and collapse: Secondary | ICD-10-CM | POA: Diagnosis not present

## 2021-07-02 LAB — GLUCOSE, CAPILLARY: Glucose-Capillary: 112 mg/dL — ABNORMAL HIGH (ref 70–99)

## 2021-07-02 NOTE — Progress Notes (Signed)
Mobility Specialist - Progress Note   07/02/21 1145  Mobility  Activity Refused mobility  Mobility performed by Mobility specialist    Pt politely declined mobility, no reason specified. Pt expecting d/c later this date.    Kathee Delton Mobility Specialist 07/02/21, 11:46 AM
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
## 2021-07-02 NOTE — Care Management Important Message (Signed)
Important Message  Patient Details  Name: [name] [name] MRN: [id number] Date of Birth: [birth year]   Medicare Important Message Given:  Yes  Reviewed Medicare IM with Paulene Floor, granddaughter, at [phone].  Copy of Medicare IM sent securely to granddaughter's attention at [email or web address].   Dannette Barbara 07/02/2021, 12:04 PM

## 2021-07-02 NOTE — TOC Transition Note (Signed)
Transition of Care [facility]) - CM/SW Discharge Note   Patient Details  Name: [name] [name] MRN: [id number] Date of Birth: 1930-06-26  [facility] CM/SW Contact:  Beverly Sessions, RN Phone Number: 07/02/2021, 1:30 PM   Clinical Narrative:    Granddaughter agreeable to discharge to [facility]   Patient will DC [id number] [facility]  Anticipated DC date:07/02/21 Family notified:Granddaughter  Transport by:EMS  Per MD patient ready for DC to . RN, patient, patient's family, and facility notified of DC. Discharge Summary sent to facility. RN given number for report. DC packet on chart. Ambulance transport requested for patient.  TOC signing off.  Isaias Cowman [place] [phone]        Patient Goals and CMS Choice        Discharge Placement                       Discharge Plan and Services                                     Social Determinants of Health (SDOH) Interventions     Readmission Risk Interventions No flowsheet data found.

## 2021-07-10 ENCOUNTER — Emergency Department: Payer: Medicare Other

## 2021-07-10 ENCOUNTER — Emergency Department
Admission: EM | Admit: 2021-07-10 | Discharge: 2021-07-10 | Disposition: A | Discharge status: Skilled Nursing Facility | Payer: Medicare Other | Attending: Student in an Organized Health Care Education/Training Program | Admitting: Student in an Organized Health Care Education/Training Program

## 2021-07-10 ENCOUNTER — Encounter: Payer: Self-pay | Admitting: Emergency Medicine

## 2021-07-10 DIAGNOSIS — W010XXA Fall on same level from slipping, tripping and stumbling without subsequent striking against object, initial encounter: Secondary | ICD-10-CM | POA: Diagnosis not present

## 2021-07-10 DIAGNOSIS — W19XXXA Unspecified fall, initial encounter: Secondary | ICD-10-CM

## 2021-07-10 DIAGNOSIS — E785 Hyperlipidemia, unspecified: Secondary | ICD-10-CM | POA: Insufficient documentation

## 2021-07-10 DIAGNOSIS — I509 Heart failure, unspecified: Secondary | ICD-10-CM | POA: Insufficient documentation

## 2021-07-10 DIAGNOSIS — E1122 Type 2 diabetes mellitus with diabetic chronic kidney disease: Secondary | ICD-10-CM | POA: Insufficient documentation

## 2021-07-10 DIAGNOSIS — E1165 Type 2 diabetes mellitus with hyperglycemia: Secondary | ICD-10-CM | POA: Insufficient documentation

## 2021-07-10 DIAGNOSIS — S0990XA Unspecified injury of head, initial encounter: Secondary | ICD-10-CM | POA: Diagnosis not present

## 2021-07-10 DIAGNOSIS — Z79899 Other long term (current) drug therapy: Secondary | ICD-10-CM | POA: Diagnosis not present

## 2021-07-10 DIAGNOSIS — S2242XA Multiple fractures of ribs, left side, initial encounter for closed fracture: Secondary | ICD-10-CM | POA: Diagnosis not present

## 2021-07-10 DIAGNOSIS — I130 Hypertensive heart and chronic kidney disease with heart failure and stage 1 through stage 4 chronic kidney disease, or unspecified chronic kidney disease: Secondary | ICD-10-CM | POA: Insufficient documentation

## 2021-07-10 DIAGNOSIS — N184 Chronic kidney disease, stage 4 (severe): Secondary | ICD-10-CM | POA: Insufficient documentation

## 2021-07-10 DIAGNOSIS — S299XXA Unspecified injury of thorax, initial encounter: Secondary | ICD-10-CM | POA: Diagnosis present

## 2021-07-10 DIAGNOSIS — E1169 Type 2 diabetes mellitus with other specified complication: Secondary | ICD-10-CM | POA: Insufficient documentation

## 2021-07-10 LAB — CBC WITH DIFFERENTIAL/PLATELET
Abs Immature Granulocytes: 0.08 10*3/uL — ABNORMAL HIGH (ref 0.00–0.07)
Basophils Absolute: 0 10*3/uL (ref 0.0–0.1)
Basophils Relative: 0 %
Eosinophils Absolute: 0.2 10*3/uL (ref 0.0–0.5)
Eosinophils Relative: 2 %
HCT: 40.3 % (ref 36.0–46.0)
Hemoglobin: 12.9 g/dL (ref 12.0–15.0)
Immature Granulocytes: 1 %
Lymphocytes Relative: 25 %
Lymphs Abs: 2.4 10*3/uL (ref 0.7–4.0)
MCH: 30.8 pg (ref 26.0–34.0)
MCHC: 32 g/dL (ref 30.0–36.0)
MCV: 96.2 fL (ref 80.0–100.0)
Monocytes Absolute: 0.8 10*3/uL (ref 0.1–1.0)
Monocytes Relative: 8 %
Neutro Abs: 6.3 10*3/uL (ref 1.7–7.7)
Neutrophils Relative %: 64 %
Platelets: 195 10*3/uL (ref 150–400)
RBC: 4.19 MIL/uL (ref 3.87–5.11)
RDW: 12.9 % (ref 11.5–15.5)
WBC: 9.7 10*3/uL (ref 4.0–10.5)
nRBC: 0 % (ref 0.0–0.2)

## 2021-07-10 LAB — COMPREHENSIVE METABOLIC PANEL
ALT: 14 U/L (ref 0–44)
AST: 20 U/L (ref 15–41)
Albumin: 3.7 g/dL (ref 3.5–5.0)
Alkaline Phosphatase: 58 U/L (ref 38–126)
Anion gap: 8 (ref 5–15)
BUN: 65 mg/dL — ABNORMAL HIGH (ref 8–23)
CO2: 25 mmol/L (ref 22–32)
Calcium: 9.8 mg/dL (ref 8.9–10.3)
Chloride: 102 mmol/L (ref 98–111)
Creatinine, Ser: 2.4 mg/dL — ABNORMAL HIGH (ref 0.44–1.00)
GFR, Estimated: 19 mL/min — ABNORMAL LOW (ref >60)
Glucose, Bld: 194 mg/dL — ABNORMAL HIGH (ref 70–99)
Potassium: 5 mmol/L (ref 3.5–5.1)
Sodium: 135 mmol/L (ref 135–145)
Total Bilirubin: 0.7 mg/dL (ref 0.3–1.2)
Total Protein: 8.2 g/dL — ABNORMAL HIGH (ref 6.5–8.1)

## 2021-07-10 LAB — URINALYSIS, COMPLETE (UACMP) WITH MICROSCOPIC
Bilirubin Urine: NEGATIVE
Glucose, UA: NEGATIVE mg/dL
Hgb urine dipstick: NEGATIVE
Ketones, ur: NEGATIVE mg/dL
Nitrite: NEGATIVE
Protein, ur: NEGATIVE mg/dL
Specific Gravity, Urine: 1.013 (ref 1.005–1.030)
pH: 7 (ref 5.0–8.0)

## 2021-07-10 LAB — TROPONIN I (HIGH SENSITIVITY)
Troponin I (High Sensitivity): 29 ng/L — ABNORMAL HIGH (ref <18)
Troponin I (High Sensitivity): 26 ng/L — ABNORMAL HIGH (ref <18)

## 2021-07-10 LAB — CK: Total CK: 79 U/L (ref 38–234)

## 2021-07-10 MED ORDER — CEPHALEXIN 500 MG PO CAPS: 500.0000 mg | ORAL_CAPSULE | Freq: Two times a day (BID) | ORAL | 0 refills | Status: AC

## 2021-07-10 MED ORDER — FENTANYL CITRATE PF 50 MCG/ML IJ SOSY
50.0000 ug | PREFILLED_SYRINGE | INTRAMUSCULAR | Status: DC | PRN
  Administered 2021-07-10: 50 ug via INTRAVENOUS
  Filled 2021-07-10: qty 1

## 2021-07-10 MED ORDER — LIDOCAINE 5 % EX PTCH: 1.0000 | MEDICATED_PATCH | Freq: Two times a day (BID) | CUTANEOUS | 0 refills | Status: DC

## 2021-07-10 MED ORDER — SODIUM CHLORIDE 0.9 % IV BOLUS
500.0000 mL | Freq: Once | INTRAVENOUS | Status: AC
  Administered 2021-07-10: 500 mL via INTRAVENOUS

## 2021-07-10 MED ORDER — SODIUM CHLORIDE 0.9 % IV SOLN
1.0000 g | Freq: Once | INTRAVENOUS | Status: AC
  Administered 2021-07-10: 1 g via INTRAVENOUS
  Filled 2021-07-10: qty 10

## 2021-07-10 MED ORDER — HYDROCODONE-ACETAMINOPHEN 7.5-325 MG/15ML PO SOLN: 5.0000 mL | Freq: Four times a day (QID) | ORAL | 0 refills | Status: DC | PRN

## 2021-07-10 MED ORDER — LIDOCAINE 5 % EX PTCH
1.0000 | MEDICATED_PATCH | CUTANEOUS | Status: DC
  Administered 2021-07-10: 1 via TRANSDERMAL
  Filled 2021-07-10: qty 1

## 2021-07-10 NOTE — Discharge Instructions (Addendum)
IMPRESSION:  1. No acute intracranial abnormality.  2. No acute displaced fracture or traumatic listhesis of the  cervical spine.  3. Acute minimally displaced left anterior fifth and sixth ribs. No  associated pneumothorax.  4. No assess acute intrathoracic traumatic injury on this  noncontrast study.  5. No acute fracture or traumatic malalignment of the thoracicspine.  6. Diffuse scattered centrilobular pulmonary nodules and tree-in-bud  nodularity. Findings likely representing chronic atypical  infection/inflammation versus prior granulomatous disease. Findings  at the lung bases are stable to prior CT abdomen pelvis. CT chest  02/22/2012 images are not visualized to compare.  7. Enlarged ascending thoracic aorta measuring up 4 cm Recommend  annual imaging followup by CTA or MRA. This recommendation follows  2010 ACCF/AHA/AATS/ACR/ASA/SCA/SCAI/SIR/STS/SVM Guidelines for the  Diagnosis and Management of Patients with Thoracic Aortic Disease.  Circulation. 2010; [id number]. Aortic aneurysm NOS (ICD10-I71.9)  .

## 2021-07-10 NOTE — ED Notes (Signed)
Patient transported to X-ray 

## 2021-07-10 NOTE — ED Notes (Signed)
[facility] called to transport back to [facility]

## 2021-07-10 NOTE — ED Notes (Signed)
Pure wick placed.

## 2021-07-10 NOTE — ED Notes (Signed)
Pt ambulated in room with a walker

## 2021-07-10 NOTE — ED Triage Notes (Signed)
Pt arrives via EMS from [facility] care with reports of unwitnessed fall while going to the bathroom today. Pt has deformity to left shoulder. Denies hitting head. States it hurts under her left side when she takes deep breaths.
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
## 2021-07-17 ENCOUNTER — Inpatient Hospital Stay
Admission: EM | Admit: 2021-07-17 | Discharge: 2021-07-23 | DRG: 683 | Disposition: A | Discharge status: Skilled Nursing Facility | Payer: Medicare Other | Attending: Internal Medicine | Admitting: Internal Medicine

## 2021-07-17 ENCOUNTER — Other Ambulatory Visit: Payer: Self-pay

## 2021-07-17 ENCOUNTER — Emergency Department: Payer: Medicare Other

## 2021-07-17 DIAGNOSIS — G309 Alzheimer's disease, unspecified: Secondary | ICD-10-CM

## 2021-07-17 DIAGNOSIS — Z20822 Contact with and (suspected) exposure to covid-19: Secondary | ICD-10-CM | POA: Diagnosis present

## 2021-07-17 DIAGNOSIS — I248 Other forms of acute ischemic heart disease: Secondary | ICD-10-CM | POA: Diagnosis not present

## 2021-07-17 DIAGNOSIS — F03918 Unspecified dementia, unspecified severity, with other behavioral disturbance: Secondary | ICD-10-CM

## 2021-07-17 DIAGNOSIS — Y92010 Kitchen of single-family (private) house as the place of occurrence of the external cause: Secondary | ICD-10-CM

## 2021-07-17 DIAGNOSIS — I10 Essential (primary) hypertension: Secondary | ICD-10-CM | POA: Diagnosis present

## 2021-07-17 DIAGNOSIS — E119 Type 2 diabetes mellitus without complications: Secondary | ICD-10-CM | POA: Diagnosis not present

## 2021-07-17 DIAGNOSIS — F0281 Dementia in other diseases classified elsewhere with behavioral disturbance: Secondary | ICD-10-CM

## 2021-07-17 DIAGNOSIS — R55 Syncope and collapse: Secondary | ICD-10-CM | POA: Diagnosis not present

## 2021-07-17 DIAGNOSIS — I5032 Chronic diastolic (congestive) heart failure: Secondary | ICD-10-CM | POA: Diagnosis present

## 2021-07-17 DIAGNOSIS — Z888 Allergy status to other drugs, medicaments and biological substances status: Secondary | ICD-10-CM

## 2021-07-17 DIAGNOSIS — Z9181 History of falling: Secondary | ICD-10-CM

## 2021-07-17 DIAGNOSIS — W1830XA Fall on same level, unspecified, initial encounter: Secondary | ICD-10-CM | POA: Diagnosis present

## 2021-07-17 DIAGNOSIS — N179 Acute kidney failure, unspecified: Secondary | ICD-10-CM | POA: Diagnosis not present

## 2021-07-17 DIAGNOSIS — R451 Restlessness and agitation: Secondary | ICD-10-CM | POA: Diagnosis not present

## 2021-07-17 DIAGNOSIS — F0391 Unspecified dementia with behavioral disturbance: Secondary | ICD-10-CM | POA: Diagnosis present

## 2021-07-17 DIAGNOSIS — N390 Urinary tract infection, site not specified: Secondary | ICD-10-CM | POA: Diagnosis present

## 2021-07-17 DIAGNOSIS — E785 Hyperlipidemia, unspecified: Secondary | ICD-10-CM | POA: Diagnosis present

## 2021-07-17 DIAGNOSIS — N184 Chronic kidney disease, stage 4 (severe): Secondary | ICD-10-CM | POA: Diagnosis present

## 2021-07-17 DIAGNOSIS — Z79899 Other long term (current) drug therapy: Secondary | ICD-10-CM

## 2021-07-17 DIAGNOSIS — E1122 Type 2 diabetes mellitus with diabetic chronic kidney disease: Secondary | ICD-10-CM | POA: Diagnosis present

## 2021-07-17 DIAGNOSIS — R296 Repeated falls: Secondary | ICD-10-CM

## 2021-07-17 DIAGNOSIS — F0394 Unspecified dementia, unspecified severity, with anxiety: Secondary | ICD-10-CM

## 2021-07-17 DIAGNOSIS — W19XXXA Unspecified fall, initial encounter: Secondary | ICD-10-CM | POA: Diagnosis present

## 2021-07-17 DIAGNOSIS — I130 Hypertensive heart and chronic kidney disease with heart failure and stage 1 through stage 4 chronic kidney disease, or unspecified chronic kidney disease: Secondary | ICD-10-CM | POA: Diagnosis present

## 2021-07-17 DIAGNOSIS — S0091XA Abrasion of unspecified part of head, initial encounter: Secondary | ICD-10-CM | POA: Diagnosis present

## 2021-07-17 LAB — CBC
HCT: 36 % (ref 36.0–46.0)
Hemoglobin: 11.2 g/dL — ABNORMAL LOW (ref 12.0–15.0)
MCH: 29.9 pg (ref 26.0–34.0)
MCHC: 31.1 g/dL (ref 30.0–36.0)
MCV: 96.3 fL (ref 80.0–100.0)
Platelets: 195 10*3/uL (ref 150–400)
RBC: 3.74 MIL/uL — ABNORMAL LOW (ref 3.87–5.11)
RDW: 13.6 % (ref 11.5–15.5)
WBC: 8.2 10*3/uL (ref 4.0–10.5)
nRBC: 0 % (ref 0.0–0.2)

## 2021-07-17 LAB — URINALYSIS, COMPLETE (UACMP) WITH MICROSCOPIC
Bacteria, UA: NONE SEEN
Bilirubin Urine: NEGATIVE
Glucose, UA: NEGATIVE mg/dL
Hgb urine dipstick: NEGATIVE
Ketones, ur: NEGATIVE mg/dL
Leukocytes,Ua: NEGATIVE
Nitrite: NEGATIVE
Protein, ur: NEGATIVE mg/dL
Specific Gravity, Urine: 1.02 (ref 1.005–1.030)
pH: 5.5 (ref 5.0–8.0)

## 2021-07-17 LAB — BASIC METABOLIC PANEL
Anion gap: 8 (ref 5–15)
BUN: 54 mg/dL — ABNORMAL HIGH (ref 8–23)
CO2: 25 mmol/L (ref 22–32)
Calcium: 9.3 mg/dL (ref 8.9–10.3)
Chloride: 105 mmol/L (ref 98–111)
Creatinine, Ser: 2.5 mg/dL — ABNORMAL HIGH (ref 0.44–1.00)
GFR, Estimated: 18 mL/min — ABNORMAL LOW (ref >60)
Glucose, Bld: 145 mg/dL — ABNORMAL HIGH (ref 70–99)
Potassium: 4.9 mmol/L (ref 3.5–5.1)
Sodium: 138 mmol/L (ref 135–145)

## 2021-07-17 LAB — URINE DRUG SCREEN, QUALITATIVE (ARMC ONLY)
Amphetamines, Ur Screen: NOT DETECTED
Barbiturates, Ur Screen: NOT DETECTED
Benzodiazepine, Ur Scrn: NOT DETECTED
Cannabinoid 50 Ng, Ur ~~LOC~~: NOT DETECTED
Cocaine Metabolite,Ur ~~LOC~~: NOT DETECTED
MDMA (Ecstasy)Ur Screen: NOT DETECTED
Methadone Scn, Ur: NOT DETECTED
Opiate, Ur Screen: NOT DETECTED
Phencyclidine (PCP) Ur S: NOT DETECTED
Tricyclic, Ur Screen: NOT DETECTED

## 2021-07-17 LAB — TROPONIN I (HIGH SENSITIVITY)
Troponin I (High Sensitivity): 45 ng/L — ABNORMAL HIGH (ref <18)
Troponin I (High Sensitivity): 49 ng/L — ABNORMAL HIGH (ref <18)

## 2021-07-17 MED ORDER — ENOXAPARIN SODIUM 30 MG/0.3ML IJ SOSY
30.0000 mg | PREFILLED_SYRINGE | INTRAMUSCULAR | Status: DC
  Administered 2021-07-18 – 2021-07-22 (×5): 30 mg via SUBCUTANEOUS
  Filled 2021-07-17 (×5): qty 0.3

## 2021-07-17 MED ORDER — TRAZODONE HCL 50 MG PO TABS
50.0000 mg | ORAL_TABLET | Freq: Every day | ORAL | Status: DC
  Administered 2021-07-18 – 2021-07-22 (×6): 50 mg via ORAL
  Filled 2021-07-17 (×6): qty 1

## 2021-07-17 MED ORDER — SODIUM CHLORIDE 0.9 % IV BOLUS
250.0000 mL | Freq: Once | INTRAVENOUS | Status: AC
  Administered 2021-07-17: 250 mL via INTRAVENOUS

## 2021-07-17 MED ORDER — OLANZAPINE 10 MG IM SOLR
5.0000 mg | Freq: Once | INTRAMUSCULAR | Status: AC | PRN
  Administered 2021-07-17: 5 mg via INTRAMUSCULAR
  Filled 2021-07-17 (×2): qty 10

## 2021-07-17 NOTE — ED Triage Notes (Signed)
First Nurse Note:  Arrives from home via [facility].  C/O multiple falls this week.  Small abrasion to head.  Hx of dementia and per report patient is at baseline.

## 2021-07-17 NOTE — Progress Notes (Signed)
IV Team consulted for PIV assessment with no IV Rx orders or procedures. Advised primary RN whom is obtaining an order for IV fluids from MD.  IVT will attempt to collect blood during PIV placement.

## 2021-07-17 NOTE — ED Triage Notes (Signed)
See first nurse note. Pt singing loudly, not answering all questions appropriately. States her family told her "i'm tired of you, every damn day youre falling in the floor, fucking up my shit, get on somewhere" Pt cannot state why she is here, states she is not sick, however bob barker told her if she needs a million dollars to just let him know and she will receive it.  Oriented to person only Denies pain

## 2021-07-17 NOTE — Progress Notes (Signed)
PHARMACIST - PHYSICIAN COMMUNICATION  CONCERNING:  Enoxaparin (Lovenox) for DVT Prophylaxis    RECOMMENDATION: Patient was prescribed enoxaprin '40mg'$  q24 hours for VTE prophylaxis.   Filed Weights   07/17/21 1346  Weight: 85 kg (187 lb 6.3 oz)    Body mass index is 26.14 kg/m.  Estimated Creatinine Clearance: 18.4 mL/min (A) (by C-G formula based on SCr of 2.5 mg/dL (H)).  Patient is candidate for enoxaparin '30mg'$  every 24 hours based on CrCl <17m/min or Weight <45kg  DESCRIPTION: [facility]  Patient is now receiving enoxaparin 30 mg every 24 hours   NRenda Rolls PharmD, MAdventhealth Sebring9/05/2021 11:29 PM

## 2021-07-17 NOTE — ED Notes (Signed)
hospitalist in to see patient and pt found lying on the floor; pt states she "walked" down there because it makes her back feel better; pt encouraged to get up and sit in chair but pt out yelling and screaming stating "God put me here"; pt finally up to bed

## 2021-07-17 NOTE — ED Notes (Signed)
Pt states had 1 fall today in kitchen. Denies dizziness, SOB, CP. EDP has seen pt. Pt to CT.

## 2021-07-17 NOTE — ED Notes (Signed)
Waiting for IV team for blood draw. Pt given blankets, sandwich tray, water and oranje juice.

## 2021-07-17 NOTE — ED Notes (Signed)
Socks applied

## 2021-07-17 NOTE — ED Notes (Signed)
Lab was unable to draw blood at this time.

## 2021-07-17 NOTE — ED Notes (Signed)
Placed in recliner chair for safety.

## 2021-07-17 NOTE — ED Notes (Signed)
Two nurses attempted blood draw 2 times each. Will inform EDP may need u/s IV.
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
## 2021-07-17 NOTE — ED Notes (Signed)
Pt stuck a total of 3 times. Blood draws unsuccessful. Lab called.

## 2021-07-17 NOTE — H&P (Signed)
History and Physical    [name] [name] [id number] DOB: 1929-12-23 DOA: 07/17/2021  PCP: Beckie Salts, MD  Patient coming from: Home  I have personally briefly reviewed patient's old medical records in [place]  Chief Complaint: Fall  HPI: [name] [name] is a 87 y.o. female with medical history significant for Dementia, hypertension, chronic diastolic heart failure, type 2 diabetes, CKD stage IV, depression and obesity who presents with concerns of fall versus syncope.  Patient recently evaluated in the ED on 8/31 with a fall versus syncope.  She was found to have left fifth and sixth rib anterior fracture.  Also noted to have UTI.  Patient was offered admission but declined and was sent home with Keflex.  Prior to that, she had a recent prolonged admission from 8/9-8/23 due to unsafe discharge plan since granddaughter at home was unable to care for her.  She was admitted at that time for syncope with collapse.  Had reassuring TTE with EF of 50 to 55% and grade 2 diastolic dysfunction on [id number].  Had amlodipine and pregabalin discontinued at discharge.  Today patient reportedly presented after she was found by her granddaughter to be on the ground with loss of consciousness.  Patient unable to provide history.  No family bedside.  On my evaluation, patient was noted to be lying on the floor.  States she laid herself down on the floor since it was better for her back.  She was agitated and did not anyone to touch her.  States she "mother asked if we touched her."  Stated that she was a "child of God."  Patient later became more cooperative and had two-person assist back to bed.  She then briefly became agitated again that people were touching her but then later became pleasant when she was put back into bed.    Review of Systems:  Unable to obtain due to patient altered mental status  Past Medical History:  Diagnosis Date   Diabetes mellitus    Hyperlipidemia    Hypertension     Past  Surgical History:  Procedure Laterality Date   APPENDECTOMY     CHOLECYSTECTOMY     COLONOSCOPY  06/23/2012   Procedure: COLONOSCOPY;  Surgeon: Lear Ng, MD;  Location: [facility];  Service: Endoscopy;  Laterality: N/A;   ESOPHAGOGASTRODUODENOSCOPY  06/23/2012   Procedure: ESOPHAGOGASTRODUODENOSCOPY (EGD);  Surgeon: Lear Ng, MD;  Location: [facility] ENDOSCOPY;  Service: Endoscopy;  Laterality: N/A;     reports that she does not drink alcohol and does not use drugs. No history on file for tobacco use. Social History  Allergies  Allergen Reactions   Alendronate     Other reaction(s): Other (See Comments) Poor renal function.    No family history on file.   Prior to Admission medications   Medication Sig Start Date End Date Taking? Authorizing Provider  acetaminophen (TYLENOL) 325 MG tablet Take 650 mg by mouth every 4 (four) hours as needed for mild pain.    [provider]  atorvastatin (LIPITOR) 20 MG tablet Take 20 mg by mouth daily. 06/13/21   [provider]  cephALEXin (KEFLEX) 500 MG capsule Take 1 capsule (500 mg total) by mouth 2 (two) times daily for 7 days. 07/10/21 07/17/21  Merlyn Lot, MD  citalopram (CELEXA) 20 MG tablet Take 1 tablet (20 mg total) by mouth daily. 02/06/21   Clapacs, Madie Reno, MD  furosemide (LASIX) 20 MG tablet Take 20 mg by mouth daily. 06/13/21   [provider]  HYDROcodone-acetaminophen (HYCET) 7.5-325 mg/15 ml solution Take 5 mLs by mouth 4 (four) times daily as needed for severe pain. 07/10/21 07/10/22  Merlyn Lot, MD  lidocaine (LIDODERM) 5 % Place 1 patch onto the skin every 12 (twelve) hours. Remove & Discard patch within 12 hours or as directed by MD 07/10/21 07/10/22  Merlyn Lot, MD  OLANZapine (ZYPREXA) 2.5 MG tablet Take 1 tablet (2.5 mg total) by mouth at bedtime. 02/05/21   Clapacs, Madie Reno, MD  pantoprazole (PROTONIX) 40 MG tablet Take 40 mg by mouth daily.    [provider]   traZODone (DESYREL) 50 MG tablet Take 50 mg by mouth at bedtime as needed for sleep. 05/14/21   [provider]  Vitamin D, Ergocalciferol, (DRISDOL) 1.25 MG (50000 UNIT) CAPS capsule Take 50,000 Units by mouth once a week. 06/06/21   [provider]    Physical Exam: Vitals:   07/17/21 1900 07/17/21 1930 07/17/21 2000 07/17/21 2030  BP: 124/75 (!) 117/59 (!) 141/66 129/71  Pulse: 76 (!) 55 (!) 59 62  Resp: '13 17 17 18  '$ Temp:      TempSrc:      SpO2: 100% 99% 100% 100%  Weight:      Height:        Constitutional: NAD, patient initially found laying on her left side comfortably on the floor stating that she placed herself there.  Nurses reportedly did not hear any falls.  She did not complain of any pain.  She was very agitated stating that she was "out of gone" and that "no one better touch her" She was later cooperative and assisted back to bed by 2 helpers and was amendable to a limited exam. Vitals:   07/17/21 1900 07/17/21 1930 07/17/21 2000 07/17/21 2030  BP: 124/75 (!) 117/59 (!) 141/66 129/71  Pulse: 76 (!) 55 (!) 59 62  Resp: '13 17 17 18  '$ Temp:      TempSrc:      SpO2: 100% 99% 100% 100%  Weight:      Height:       Eyes: PERRL, lids and conjunctivae normal ENMT: Mucous membranes are moist.  Neck: normal, supple,  Respiratory: clear to auscultation bilaterally, no wheezing, no crackles. Normal respiratory effort. No accessory muscle use.  Cardiovascular: Regular rate and rhythm, no murmurs / rubs / gallops. No extremity edema.  Abdomen: no tenderness, no masses palpated. Musculoskeletal: no clubbing / cyanosis. No joint deformity upper and lower extremities. Good ROM, no contractures. Normal muscle tone.  Skin: no rashes, lesions, ulcers. No induration Neurologic: Alert and oriented only to self.  Able to move all extremities.   Psychiatric: Agitated and repeatedly stated that she was "child of God"   Labs on Admission: I have personally reviewed  following labs and imaging studies  CBC: Recent Labs  Lab 07/17/21 1915  WBC 8.2  HGB 11.2*  HCT 36.0  MCV 96.3  PLT 0000000   Basic Metabolic Panel: Recent Labs  Lab 07/17/21 1915  NA 138  K 4.9  CL 105  CO2 25  GLUCOSE 145*  BUN 54*  CREATININE 2.50*  CALCIUM 9.3   GFR: Estimated Creatinine Clearance: 18.4 mL/min (A) (by C-G formula based on SCr of 2.5 mg/dL (H)). Liver Function Tests: No results for input(s): AST, ALT, ALKPHOS, BILITOT, PROT, ALBUMIN in the last 168 hours. No results for input(s): LIPASE, AMYLASE in the last 168 hours. No results for input(s): AMMONIA in the last 168 hours.  Coagulation Profile: No results for input(s): INR, PROTIME in the last 168 hours. Cardiac Enzymes: No results for input(s): CKTOTAL, CKMB, CKMBINDEX, TROPONINI in the last 168 hours. BNP (last 3 results) No results for input(s): PROBNP in the last 8760 hours. HbA1C: No results for input(s): HGBA1C in the last 72 hours. CBG: No results for input(s): GLUCAP in the last 168 hours. Lipid Profile: No results for input(s): CHOL, HDL, LDLCALC, TRIG, CHOLHDL, LDLDIRECT in the last 72 hours. Thyroid Function Tests: No results for input(s): TSH, T4TOTAL, FREET4, T3FREE, THYROIDAB in the last 72 hours. Anemia Panel: No results for input(s): VITAMINB12, FOLATE, FERRITIN, TIBC, IRON, RETICCTPCT in the last 72 hours. Urine analysis:    Component Value Date/Time   COLORURINE YELLOW 07/17/2021 [place] 07/17/2021 1348   LABSPEC 1.020 07/17/2021 1348   PHURINE 5.5 07/17/2021 1348   GLUCOSEU NEGATIVE 07/17/2021 1348   HGBUR NEGATIVE 07/17/2021 1348   BILIRUBINUR NEGATIVE 07/17/2021 1348   KETONESUR NEGATIVE 07/17/2021 1348   PROTEINUR NEGATIVE 07/17/2021 1348   UROBILINOGEN 1.0 12/30/2014 1333   NITRITE NEGATIVE 07/17/2021 1348   LEUKOCYTESUR NEGATIVE 07/17/2021 1348    Radiological Exams on Admission: CT HEAD WO CONTRAST (5MM)  Result Date: 07/17/2021 CLINICAL DATA:   Head trauma, multiple falls EXAM: CT HEAD WITHOUT CONTRAST TECHNIQUE: Contiguous axial images were obtained from the base of the skull through the vertex without intravenous contrast. COMPARISON:  07/10/2021 FINDINGS: Brain: No evidence of acute infarction, hemorrhage, cerebral edema, mass, mass effect, or midline shift. Ventricles and sulci are concordant. No extra-axial fluid collection. Periventricular white matter changes, likely the sequela of chronic small vessel ischemic disease. Vascular: No hyperdense vessel or unexpected calcification. Skull: Remote bilateral lamina papyracea fractures. Negative for acute fracture or focal lesion. Sinuses/Orbits: Mucosal thickening in the ethmoid air cells. Status post bilateral lens replacements. Left scleral band. Other: The mastoid air cells are well aerated. Edema in the right frontal scalp. IMPRESSION: No acute intracranial process. Electronically Signed   By: Merilyn Baba M.D.   On: 07/17/2021 17:33   CT Cervical Spine Wo Contrast  Result Date: 07/17/2021 CLINICAL DATA:  Multiple falls this week. Small abrasion along the head. Dementia. EXAM: CT CERVICAL SPINE WITHOUT CONTRAST TECHNIQUE: Multidetector CT imaging of the cervical spine was performed without intravenous contrast. Multiplanar CT image reconstructions were also generated. COMPARISON:  07/10/2021 FINDINGS: Alignment: Stable straightening of the cervical lordosis. Skull base and vertebrae: Solid interbody fusion at C4-C5-C6 potentially with partial interbody fusion at C6-7. Notable loss of intervertebral disc height at the remaining cervical vertebral levels and in the upper thoracic spine. Fused facet joints at C4-5 bilaterally. I do not discern a cervical spine fracture or acute bony abnormality. Soft tissues and spinal canal: Atherosclerosis is present, including the carotid arteries and aortic arch. Disc levels: Uncinate and facet spurring contribute to osseous foraminal stenosis on the left at  C3-4, C4-5, C5-6, and C6-7; and on the right at C3-4 and potentially T1-2. Posterior osseous ridging at C3-4 may be contributing to mild central narrowing of the thecal sac, unchanged. Upper chest: 3 mm left apical pulmonary nodule on image 79 series 2, no change from 07/10/2021 where this was part of scattered regional nodularity both upper lobes. Other: Degenerative sternoclavicular arthropathy bilaterally. IMPRESSION: 1. No acute cervical spine findings. 2. Stable appearance of multilevel interbody fusion and multilevel osseous foraminal narrowing due to spurring. 3.  Aortic Atherosclerosis (ICD10-I70.0). Electronically Signed   By: Van Clines M.D.   On: 07/17/2021  17:39      Assessment/Plan  Questionable fall/syncope - Patient reportedly had a unwitnessed fall with loss of consciousness at home by granddaughter.  Here on my evaluation, patient had laid herself on the floor -She was recently admitted in August for similar symptoms but had normal echocardiogram.  Had amlodipine and pregabalin discontinued -We will keep on continuous telemetry -Obtain orthostatic vital signs, vitamin B12 and folate -However suspect patient has psychiatric overlay/possible bipolar-need psychiatric consult  Agitation/delirium - Patient reportedly has dementia and is only alert and oriented to self at this time.  Suspect she has some psych overlay.  Will need psychiatric consult -Sitter -As needed IM Zyprexa-pt had threatened to be combative    AKI on CKD stage IV - Unclear baseline but creatinine is elevated to 2.5 from prior of 2.4 -250 cc bolus in the ED.  Will follow with repeat labs in the morning  Elevated troponin - Troponin of 45 then 49.  Suspect more demand ischemia and due to worsening renal insufficiency  Type 2 diabetes - Placed on low-dose sliding scale  Hypertension -Will need to resume antihypertensives once med rec can be completed  dementia - Patient only alert and oriented to  self at this time although she does mention that she lives with her granddaughter  DVT prophylaxis:.Lovenox Code Status: Full Family Communication: No family at bedside  disposition Plan: Might need possible SNF placement Consults called:  Admission status: Observation  Level of care: Med-Surg  Status is: Observation  The patient remains OBS appropriate and will d/c before 2 midnights.  Dispo: The patient is from: Home              Anticipated d/c is to:  TBD              Patient currently is not medically stable to d/c.   Difficult to place patient No         Orene Desanctis DO [facility]   If 7PM-7AM, please contact night-coverage [email or web address]   07/17/2021, 11:19 PM

## 2021-07-18 ENCOUNTER — Encounter: Payer: Self-pay | Admitting: Family Medicine

## 2021-07-18 DIAGNOSIS — F0391 Unspecified dementia with behavioral disturbance: Secondary | ICD-10-CM

## 2021-07-18 DIAGNOSIS — F03918 Unspecified dementia, unspecified severity, with other behavioral disturbance: Secondary | ICD-10-CM

## 2021-07-18 DIAGNOSIS — W19XXXA Unspecified fall, initial encounter: Secondary | ICD-10-CM | POA: Diagnosis not present

## 2021-07-18 DIAGNOSIS — F0394 Unspecified dementia, unspecified severity, with anxiety: Secondary | ICD-10-CM

## 2021-07-18 DIAGNOSIS — W19XXXD Unspecified fall, subsequent encounter: Secondary | ICD-10-CM

## 2021-07-18 DIAGNOSIS — Z7189 Other specified counseling: Secondary | ICD-10-CM

## 2021-07-18 DIAGNOSIS — G309 Alzheimer's disease, unspecified: Secondary | ICD-10-CM | POA: Diagnosis not present

## 2021-07-18 DIAGNOSIS — R451 Restlessness and agitation: Secondary | ICD-10-CM

## 2021-07-18 DIAGNOSIS — F0281 Dementia in other diseases classified elsewhere with behavioral disturbance: Secondary | ICD-10-CM | POA: Diagnosis not present

## 2021-07-18 LAB — FOLATE: Folate: 12.8 ng/mL (ref >5.9)

## 2021-07-18 LAB — BASIC METABOLIC PANEL
Anion gap: 9 (ref 5–15)
BUN: 51 mg/dL — ABNORMAL HIGH (ref 8–23)
CO2: 27 mmol/L (ref 22–32)
Calcium: 9.5 mg/dL (ref 8.9–10.3)
Chloride: 107 mmol/L (ref 98–111)
Creatinine, Ser: 2.37 mg/dL — ABNORMAL HIGH (ref 0.44–1.00)
GFR, Estimated: 19 mL/min — ABNORMAL LOW (ref >60)
Glucose, Bld: 125 mg/dL — ABNORMAL HIGH (ref 70–99)
Potassium: 5.1 mmol/L (ref 3.5–5.1)
Sodium: 143 mmol/L (ref 135–145)

## 2021-07-18 LAB — VITAMIN B12: Vitamin B-12: 200 pg/mL (ref 180–914)

## 2021-07-18 LAB — RESP PANEL BY RT-PCR (FLU A&B, COVID) ARPGX2
Influenza A by PCR: NEGATIVE
Influenza B by PCR: NEGATIVE
SARS Coronavirus 2 by RT PCR: NEGATIVE

## 2021-07-18 LAB — TROPONIN I (HIGH SENSITIVITY): Troponin I (High Sensitivity): 48 ng/L — ABNORMAL HIGH (ref <18)

## 2021-07-18 MED ORDER — HALOPERIDOL LACTATE 5 MG/ML IJ SOLN
2.0000 mg | Freq: Four times a day (QID) | INTRAMUSCULAR | Status: DC | PRN
  Administered 2021-07-18: 14:00:00 2 mg via INTRAMUSCULAR
  Filled 2021-07-18 (×2): qty 1

## 2021-07-18 MED ORDER — ZIPRASIDONE MESYLATE 20 MG IM SOLR
10.0000 mg | Freq: Once | INTRAMUSCULAR | Status: AC
  Administered 2021-07-18: 10 mg via INTRAMUSCULAR
  Filled 2021-07-18: qty 20

## 2021-07-18 MED ORDER — FUROSEMIDE 20 MG PO TABS
20.0000 mg | ORAL_TABLET | Freq: Every day | ORAL | Status: DC
  Administered 2021-07-18 – 2021-07-23 (×6): 20 mg via ORAL
  Filled 2021-07-18 (×6): qty 1

## 2021-07-18 MED ORDER — CITALOPRAM HYDROBROMIDE 20 MG PO TABS
20.0000 mg | ORAL_TABLET | Freq: Every day | ORAL | Status: DC
  Administered 2021-07-18 – 2021-07-23 (×6): 20 mg via ORAL
  Filled 2021-07-18 (×6): qty 1

## 2021-07-18 MED ORDER — TRAZODONE HCL 50 MG PO TABS: 50.0000 mg | ORAL_TABLET | Freq: Every evening | ORAL | Status: DC | PRN

## 2021-07-18 MED ORDER — HALOPERIDOL 2 MG PO TABS
2.0000 mg | ORAL_TABLET | Freq: Four times a day (QID) | ORAL | Status: DC | PRN
  Administered 2021-07-22: 2 mg via ORAL
  Filled 2021-07-18 (×3): qty 1

## 2021-07-18 MED ORDER — OLANZAPINE 2.5 MG PO TABS
2.5000 mg | ORAL_TABLET | Freq: Every day | ORAL | Status: DC
Start: 2021-07-18 — End: 2021-07-23
  Administered 2021-07-18 – 2021-07-22 (×5): 2.5 mg via ORAL
  Filled 2021-07-18 (×8): qty 1

## 2021-07-18 MED ORDER — PANTOPRAZOLE SODIUM 40 MG PO TBEC
40.0000 mg | DELAYED_RELEASE_TABLET | Freq: Every day | ORAL | Status: DC
  Administered 2021-07-18 – 2021-07-23 (×6): 40 mg via ORAL
  Filled 2021-07-18 (×7): qty 1

## 2021-07-18 MED ORDER — ACETAMINOPHEN 325 MG PO TABS
650.0000 mg | ORAL_TABLET | ORAL | Status: DC | PRN
  Administered 2021-07-22: 09:00:00 650 mg via ORAL
  Filled 2021-07-18: qty 2

## 2021-07-18 NOTE — ED Notes (Addendum)
RN entered pt room to find that pt bed alarm was going off and not the asys pt fall monitoring system. Pt was to be found sitting on the toilet. When RN asked pt to get back in bed pt sts " I am not getting back in that bed." RN attempted to redirect pt. Pt was talking that she would like to go home and not be here anymore and that her daughter brought her here against her will and that [facility] [place]. RN asked pt about [facility] and pt sts " Why so many questions. Dont attempt to even touch me, I gotta go the [place] at four o clock and I need to get my hair done first. I need to go. Dont even touch me cracker. Dont touch me." RN was able to get another staff member that was the same race as the patient and the patient was more corporative with staff. RN notified provider of fall and provider sts that he notified pt family.

## 2021-07-18 NOTE — ED Notes (Signed)
Pt resting in chair. Not combative at this time.

## 2021-07-18 NOTE — ED Notes (Signed)
Pt under telemonitoring

## 2021-07-18 NOTE — ED Notes (Signed)
Pt taken to floor

## 2021-07-18 NOTE — ED Notes (Signed)
Pt attempting OOB again. Pt not able to get OOB and side rails are up. Pt confused.

## 2021-07-18 NOTE — Evaluation (Signed)
Physical Therapy Evaluation Patient Details Name: [name] [name] MRN: [id number] DOB: 12-31-1929 Today's Date: 07/18/2021   History of Present Illness  Pt is an 87 y.o. female presenting to hospital 07/17/21 s/p fall vs syncope; pt with multiple falls over the last several weeks and recently evaluated in ED 8/31 with fall vs syncope (found to have L 5th and 6th rib anterior fx).  Pt admitted with questionable fall/syncope, agitation/delirium, AKI on CKD stage IV, and elevated troponin (suspect d/t demand ischemia).  PMH includes htn, DM, depression, thrombocytopenia, anemia, dementia and CKD.  Clinical Impression  Prior to hospital admission, pt was ambulatory; lives with family per chart (pt oriented to self only during session).  Pt noted to be incontinent of urine and bowel in bed upon PT arrival (nurse came to assist with clean-up).  Pt singing most of session (pt able to be redirected fairly easily).  Currently pt is CGA to min assist with bed mobility; min assist with transfers; and CGA x2 (for safety) ambulating 20 feet with RW (pt letting go of walker intermittently requiring cueing/assist for safety).  Pt would benefit from skilled PT to address noted impairments and functional limitations (see below for any additional details).  Upon hospital discharge, pt would benefit from SNF.    Follow Up Recommendations SNF    Equipment Recommendations  Rolling walker with 5" wheels    Recommendations for Other Services       Precautions / Restrictions Precautions Precautions: Fall Restrictions Weight Bearing Restrictions: No      Mobility  Bed Mobility Overal bed mobility: Needs Assistance Bed Mobility: Supine to Sit;Rolling Rolling: Min guard   Supine to sit: HOB elevated;Supervision Sit to supine: Min assist;HOB elevated (assist for LE's)   General bed mobility comments: vc's for technique    Transfers Overall transfer level: Needs assistance Equipment used: Rolling walker (2  wheeled) Transfers: Sit to/from Stand Sit to Stand: Min assist         General transfer comment: vc's for walker use  Ambulation/Gait Ambulation/Gait assistance: Min guard;+2 safety/equipment Gait Distance (Feet): 20 Feet Assistive device: Rolling walker (2 wheeled)   Gait velocity: decreased   General Gait Details: pt letting go of walker intermittently requiring vc's for safety and walker use  Stairs            Wheelchair Mobility    Modified Rankin (Stroke Patients Only)       Balance Overall balance assessment: Needs assistance Sitting-balance support: No upper extremity supported;Feet supported Sitting balance-Leahy Scale: Good Sitting balance - Comments: steady sitting reaching within BOS   Standing balance support: No upper extremity supported Standing balance-Leahy Scale: Good Standing balance comment: steady standing reaching within BOS                             Pertinent Vitals/Pain Pain Assessment: No/denies pain Vitals (HR and O2 on room air) stable and WFL throughout treatment session.    Home Living Family/patient expects to be discharged to:: Private residence Living Arrangements: Other relatives (pt's granddaughter)               Additional Comments: Pt reports living in 1 level home with 2-3 STE no railings (no family present to verify information)    Prior Function Level of Independence: Needs assistance   Gait / Transfers Assistance Needed: Pt reports not using any AD to ambulate with (per recent therapy notes pt had reported ambulating limited household distances and  uses w/c when out of home that family propels)           Hand Dominance        Extremity/Trunk Assessment   Upper Extremity Assessment Upper Extremity Assessment: Generalized weakness    Lower Extremity Assessment Lower Extremity Assessment: Generalized weakness       Communication   Communication: No difficulties  Cognition  Arousal/Alertness: Awake/alert Behavior During Therapy:  (pt singing most of therapy session) Overall Cognitive Status: No family/caregiver present to determine baseline cognitive functioning                                 General Comments: Oriented to person only; does fairly well with simple one step cues      General Comments  Nursing cleared pt for participation in physical therapy.  Pt agreeable to PT session.    Exercises     Assessment/Plan    PT Assessment Patient needs continued PT services  PT Problem List Decreased strength;Decreased activity tolerance;Decreased balance;Decreased mobility;Decreased cognition;Decreased knowledge of use of DME;Decreased safety awareness;Decreased knowledge of precautions       PT Treatment Interventions DME instruction;Gait training;Stair training;Functional mobility training;Therapeutic activities;Therapeutic exercise;Balance training;Patient/family education    PT Goals (Current goals can be found in the Care Plan section)  Acute Rehab PT Goals Patient Stated Goal: none stated PT Goal Formulation: With patient Time For Goal Achievement: 08/01/21 Potential to Achieve Goals: Fair    Frequency Min 2X/week   Barriers to discharge Decreased caregiver support      Co-evaluation               AM-PAC PT "6 Clicks" Mobility  Outcome Measure Help needed turning from your back to your side while in a flat bed without using bedrails?: A Little Help needed moving from lying on your back to sitting on the side of a flat bed without using bedrails?: A Little Help needed moving to and from a bed to a chair (including a wheelchair)?: A Little Help needed standing up from a chair using your arms (e.g., wheelchair or bedside chair)?: A Little Help needed to walk in hospital room?: A Little Help needed climbing 3-5 steps with a railing? : A Lot 6 Click Score: 17    End of Session Equipment Utilized During Treatment: Gait  belt Activity Tolerance: Patient tolerated treatment well Patient left: in bed;with call bell/phone within reach;with bed alarm set;Other (comment) (ED stretcher bed in lowest height and both bed rails up) Nurse Communication: Mobility status;Precautions PT Visit Diagnosis: Other abnormalities of gait and mobility (R26.89);Muscle weakness (generalized) (M62.81);History of falling (Z91.81);Repeated falls (R29.6)    Time: [id number] PT Time Calculation (min) (ACUTE ONLY): 35 min   Charges:   PT Evaluation $PT Eval Low Complexity: 1 Low PT Treatments $Therapeutic Activity: 23-37 mins       Leitha Bleak, PT 07/18/21, 2:01 PM

## 2021-07-18 NOTE — Progress Notes (Addendum)
PROGRESS NOTE    Contessia Chern  [id number] DOB: August 03, 1930 DOA: 07/17/2021 PCP: Beckie Salts, MD    Brief Narrative:  87 y.o. female with medical history significant for Dementia, hypertension, chronic diastolic heart failure, type 2 diabetes, CKD stage IV, depression and obesity who presents with concerns of fall versus syncope.   Patient recently evaluated in the ED on 8/31 with a fall versus syncope.  She was found to have left fifth and sixth rib anterior fracture.  Also noted to have UTI.  Patient was offered admission but declined and was sent home with Keflex.   Prior to that, she had a recent prolonged admission from 8/9-8/23 due to unsafe discharge plan since granddaughter at home was unable to care for her.  She was admitted at that time for syncope with collapse.  Had reassuring TTE with EF of 50 to 55% and grade 2 diastolic dysfunction on [id number].  Had amlodipine and pregabalin discontinued at discharge.   Today patient reportedly presented after she was found by her granddaughter to be on the ground with loss of consciousness.  Patient unable to provide history.  No family bedside.  Patient remains somewhat delirious and occasionally agitated but overall has been cooperative with assessments and care.   Assessment & Plan:   Principal Problem:   Fall Active Problems:   Diabetes mellitus type 2, diet-controlled ([place])   HTN (hypertension)   Acute on chronic renal failure (HCC)   Agitation   Dementia with behavioral disturbance (HCC)  Questionable fall/syncope Unwitnessed fall at home Recent admission for similar symptoms Normal echocardiogram at that time Norvasc and pregabalin discontinued at time of previous discharge Plan: Fall precautions Therapy evaluation TOC consult Orthostatics   Agitation/delirium Reported dementia Alert only to self at this time Plan: As needed IM Zyprexa   AKI on CKD stage IV Creatinine appears at or near baseline Mild elevation  corrected after administration of IV fluids   Elevated troponin Peaked at 49.  Not indicative of ACS Supply demand ischemia suspected in setting of chronic kidney disease   Type 2 diabetes Low intensity SSI   Hypertension Restart home medications   dementia Patient alert oriented to self only.  I believe this to be her baseline.  She has advanced dementia.  I spoke at length with the patient's granddaughter.  I strongly recommended consideration for DNR status.  I stated that a code event would likely not result and preservation of life and would ultimately put the patient through suffering and her last moments.  The granddaughter agreed with my assessment however stated that she must be with her mother prior to make any decisions.   DVT prophylaxis: SQ Lovenox Code Status: Full Family Communication: Granddaughter Iran Ouch [phone] on 9/8 Disposition Plan: Status is: Observation  The patient will require care spanning > 2 midnights and should be moved to inpatient because: Altered mental status, Unsafe d/c plan, and Inpatient level of care appropriate due to severity of illness  Dispo: The patient is from: Home              Anticipated d/c is to:  TBD              Patient currently is not medically stable to d/c.   Difficult to place patient No       Level of care: Med-Surg  Consultants:  Palliative care  Procedures:  None  Antimicrobials:  None   Subjective: Patient seen and examined.  Pleasant and cooperative on my evaluation.  No apparent distress.  Objective: Vitals:   07/17/21 2030 07/18/21 0532 07/18/21 0825 07/18/21 1226  BP: 129/71 131/69 128/64 122/70  Pulse: 62 63 64 68  Resp: '18 16 14 17  '$ Temp:      TempSrc:      SpO2: 100% 100% 96% 98%  Weight:      Height:        Intake/Output Summary (Last 24 hours) at 07/18/2021 1234 Last data filed at 07/18/2021 0010 Gross per 24 hour  Intake 250 ml  Output --  Net 250 ml   Filed Weights   07/17/21  1346  Weight: 85 kg    Examination:  General exam: No acute distress Respiratory system: Lungs clear.  Normal work of breathing.  Room air Cardiovascular system: S1-S2, RRR, 2/6 murmur, no pedal edema Gastrointestinal system: Soft, NT/ND, normal bowel sounds Central nervous system: Alert to person only.  No focal deficits Extremities: Symmetric 5 x 5 power. Skin: No rashes, lesions or ulcers Psychiatry: Judgement and insight appear impaired. Mood & affect flattened.     Data Reviewed: I have personally reviewed following labs and imaging studies  CBC: Recent Labs  Lab 07/17/21 1915  WBC 8.2  HGB 11.2*  HCT 36.0  MCV 96.3  PLT 0000000   Basic Metabolic Panel: Recent Labs  Lab 07/17/21 1915 07/18/21 0742  NA 138 143  K 4.9 5.1  CL 105 107  CO2 25 27  GLUCOSE 145* 125*  BUN 54* 51*  CREATININE 2.50* 2.37*  CALCIUM 9.3 9.5   GFR: Estimated Creatinine Clearance: 19.4 mL/min (A) (by C-G formula based on SCr of 2.37 mg/dL (H)). Liver Function Tests: No results for input(s): AST, ALT, ALKPHOS, BILITOT, PROT, ALBUMIN in the last 168 hours. No results for input(s): LIPASE, AMYLASE in the last 168 hours. No results for input(s): AMMONIA in the last 168 hours. Coagulation Profile: No results for input(s): INR, PROTIME in the last 168 hours. Cardiac Enzymes: No results for input(s): CKTOTAL, CKMB, CKMBINDEX, TROPONINI in the last 168 hours. BNP (last 3 results) No results for input(s): PROBNP in the last 8760 hours. HbA1C: No results for input(s): HGBA1C in the last 72 hours. CBG: No results for input(s): GLUCAP in the last 168 hours. Lipid Profile: No results for input(s): CHOL, HDL, LDLCALC, TRIG, CHOLHDL, LDLDIRECT in the last 72 hours. Thyroid Function Tests: No results for input(s): TSH, T4TOTAL, FREET4, T3FREE, THYROIDAB in the last 72 hours. Anemia Panel: Recent Labs    07/18/21 0742  FOLATE 12.8   Sepsis Labs: No results for input(s): PROCALCITON,  LATICACIDVEN in the last 168 hours.  Recent Results (from the past 240 hour(s))  Resp Panel by RT-PCR (Flu A&B, Covid) Nasopharyngeal Swab     Status: None   Collection Time: 07/17/21 11:06 PM   Specimen: Nasopharyngeal Swab; Nasopharyngeal(NP) swabs in vial transport medium  Result Value Ref Range Status   SARS Coronavirus 2 by RT PCR NEGATIVE NEGATIVE Final    Comment: (NOTE) SARS-CoV-2 target nucleic acids are NOT DETECTED.  The SARS-CoV-2 RNA is generally detectable in upper respiratory specimens during the acute phase of infection. The lowest concentration of SARS-CoV-2 viral copies this assay can detect is 138 copies/mL. A negative result does not preclude SARS-Cov-2 infection and should not be used as the sole basis for treatment or other patient management decisions. A negative result may occur with  improper specimen collection/handling, submission of specimen other than nasopharyngeal swab, presence of viral mutation(s) within the areas targeted by this  assay, and inadequate number of viral copies(<138 copies/mL). A negative result must be combined with clinical observations, patient history, and epidemiological information. The expected result is Negative.  Fact Sheet for Patients:  EntrepreneurPulse.com.au  Fact Sheet for Healthcare Providers:  IncredibleEmployment.be  This test is no t yet approved or cleared by the [place] FDA and  has been authorized for detection and/or diagnosis of SARS-CoV-2 by FDA under an Emergency Use Authorization (EUA). This EUA will remain  in effect (meaning this test can be used) for the duration of the COVID-19 declaration under Section 564(b)(1) of the Act, 21 U.S.C.section 360bbb-3(b)(1), unless the authorization is terminated  or revoked sooner.       Influenza A by PCR NEGATIVE NEGATIVE Final   Influenza B by PCR NEGATIVE NEGATIVE Final    Comment: (NOTE) The Xpert Xpress  SARS-CoV-2/FLU/RSV plus assay is intended as an aid in the diagnosis of influenza from Nasopharyngeal swab specimens and should not be used as a sole basis for treatment. Nasal washings and aspirates are unacceptable for Xpert Xpress SARS-CoV-2/FLU/RSV testing.  Fact Sheet for Patients: EntrepreneurPulse.com.au  Fact Sheet for Healthcare Providers: IncredibleEmployment.be  This test is not yet approved or cleared by the [place] FDA and has been authorized for detection and/or diagnosis of SARS-CoV-2 by FDA under an Emergency Use Authorization (EUA). This EUA will remain in effect (meaning this test can be used) for the duration of the COVID-19 declaration under Section 564(b)(1) of the Act, 21 U.S.C. section 360bbb-3(b)(1), unless the authorization is terminated or revoked.  Performed at [facility], [address] [place], [place] [address]          Radiology Studies: CT HEAD WO CONTRAST (5MM)  Result Date: 07/17/2021 CLINICAL DATA:  Head trauma, multiple falls EXAM: CT HEAD WITHOUT CONTRAST TECHNIQUE: Contiguous axial images were obtained from the base of the skull through the vertex without intravenous contrast. COMPARISON:  07/10/2021 FINDINGS: Brain: No evidence of acute infarction, hemorrhage, cerebral edema, mass, mass effect, or midline shift. Ventricles and sulci are concordant. No extra-axial fluid collection. Periventricular white matter changes, likely the sequela of chronic small vessel ischemic disease. Vascular: No hyperdense vessel or unexpected calcification. Skull: Remote bilateral lamina papyracea fractures. Negative for acute fracture or focal lesion. Sinuses/Orbits: Mucosal thickening in the ethmoid air cells. Status post bilateral lens replacements. Left scleral band. Other: The mastoid air cells are well aerated. Edema in the right frontal scalp. IMPRESSION: No acute intracranial process. Electronically Signed    By: Merilyn Baba M.D.   On: 07/17/2021 17:33   CT Cervical Spine Wo Contrast  Result Date: 07/17/2021 CLINICAL DATA:  Multiple falls this week. Small abrasion along the head. Dementia. EXAM: CT CERVICAL SPINE WITHOUT CONTRAST TECHNIQUE: Multidetector CT imaging of the cervical spine was performed without intravenous contrast. Multiplanar CT image reconstructions were also generated. COMPARISON:  07/10/2021 FINDINGS: Alignment: Stable straightening of the cervical lordosis. Skull base and vertebrae: Solid interbody fusion at C4-C5-C6 potentially with partial interbody fusion at C6-7. Notable loss of intervertebral disc height at the remaining cervical vertebral levels and in the upper thoracic spine. Fused facet joints at C4-5 bilaterally. I do not discern a cervical spine fracture or acute bony abnormality. Soft tissues and spinal canal: Atherosclerosis is present, including the carotid arteries and aortic arch. Disc levels: Uncinate and facet spurring contribute to osseous foraminal stenosis on the left at C3-4, C4-5, C5-6, and C6-7; and on the right at C3-4 and potentially T1-2. Posterior osseous ridging at  C3-4 may be contributing to mild central narrowing of the thecal sac, unchanged. Upper chest: 3 mm left apical pulmonary nodule on image 79 series 2, no change from 07/10/2021 where this was part of scattered regional nodularity both upper lobes. Other: Degenerative sternoclavicular arthropathy bilaterally. IMPRESSION: 1. No acute cervical spine findings. 2. Stable appearance of multilevel interbody fusion and multilevel osseous foraminal narrowing due to spurring. 3.  Aortic Atherosclerosis (ICD10-I70.0). Electronically Signed   By: Van Clines M.D.   On: 07/17/2021 17:39        Scheduled Meds:  citalopram  20 mg Oral Daily   enoxaparin (LOVENOX) injection  30 mg Subcutaneous Q24H   furosemide  20 mg Oral Daily   OLANZapine  2.5 mg Oral QHS   pantoprazole  40 mg Oral Daily   traZODone   50 mg Oral QHS   Continuous Infusions:   LOS: 0 days    Time spent: 25 minutes    Sidney Ace, MD [facility] Pager [phone]  If 7PM-7AM, please contact night-coverage 07/18/2021, 12:34 PM

## 2021-07-18 NOTE — ED Notes (Signed)
Pt was attempting to get OOB and had taken off purewick and thrown food tray on floor. Repositioned in bed, reapplied purewick, covered with blankets, provided orange juice. Pt confused stating "yall white bitches ain't doin nothin for me, and I'm paying you".  Pt now resting in bed drinking orange juice.

## 2021-07-18 NOTE — Consult Note (Signed)
Consultation Note Date: 07/18/2021   Patient Name: [name] [name]  DOB: 04-03-1930  MRN: [id number]  Age / Sex: 87 y.o., female  PCP: Beckie Salts, MD Referring Physician: Sidney Ace, MD  Reason for Consultation: Establishing goals of care  HPI/Patient Profile: [name] [name] is a 87 y.o. female past medical history of CKD, depression, anemia, CKD, dementia who presents after a fall.  Patient tells me that she fell in the kitchen today.  She does not remember why.   Clinical Assessment and Goals of Care: Patient is resting in bed. She tells me she has 7 children. She states she has been living with her grandchild with the help of her daughter an great-granddaughter. She began discussing her boyfriend coming to pick her up to go get married.  Called patient's granddaughter [name] [name]. She tells me she has been helping with her grandmother's care for 10 years. She states she had lived with her previously, but her needs became such that she placed her in a nursing facility. She advises that patient began having falls and she decided to bring her back home. [name] [name] tells me since coming home, her dementia has been markedly worse. She states she uses baby monitors as patient tries to leave the house. She states her grandmother either will not, or forgets to use her walker and falls at her home as well. She states she can be up 24 hours straight and then sleep 18 hours straight. Some days she eats well and other days barely anything. She states patient talks on the phone when nobody is on it. [name] [name] advises patient is abel to feed herself and dress herself.   We discussed her diagnosis, prognosis, GOC, EOL wishes disposition and options.  A detailed discussion was had today regarding advanced directives.  Concepts specific to code status, artifical feeding and hydration, IV antibiotics and rehospitalization were  discussed.  The difference between an aggressive medical intervention path and a comfort care path was discussed.  Values and goals of care important to patient and family were attempted to be elicited.  Discussed limitations of medical interventions to prolong quality of life in some situations and discussed the concept of human mortality.  She states there are no HPOA papers or advanced directives. She states she and her mother are the caretakers. She states the other siblings are either to sick to engage in Ms. Kannan's care, or have substance abuse issues where they are not able to make decisions or help with care. She states she does not have any of their contact information.   She tells me she does not want her grandmother to suffer, and understands we cannot stay on this earth forever. She states she and her mother [name] [name] will need to talk about plans moving forward. Plans for a family conference call tomorrow at 1:00.   SUMMARY OF RECOMMENDATIONS    Plans for family conference call tomorrow at 1:00.   Prognosis:  Poor overall      Primary Diagnoses: Present on Admission:  Fall  Acute on chronic renal failure (HCC)  HTN (hypertension)   I have reviewed the medical record, interviewed the patient and family, and examined the patient. The following aspects are pertinent.  Past Medical History:  Diagnosis Date   Diabetes mellitus    Hyperlipidemia    Hypertension    Social History   Socioeconomic History   Marital status: Widowed    Spouse name: Not on file   Number of children: Not on file   Years of education: Not on file   Highest education level: Not on file  Occupational History   Not on file  Tobacco Use   Smoking status: Never   Smokeless tobacco: Not on file  Substance and Sexual Activity   Alcohol use: No   Drug use: No   Sexual activity: Not on file  Other Topics Concern   Not on file  Social History Narrative   Not on file   Social Determinants of  Health   Financial Resource Strain: Not on file  Food Insecurity: Not on file  Transportation Needs: Not on file  Physical Activity: Not on file  Stress: Not on file  Social Connections: Not on file   No family history on file. Scheduled Meds:  citalopram  20 mg Oral Daily   enoxaparin (LOVENOX) injection  30 mg Subcutaneous Q24H   furosemide  20 mg Oral Daily   OLANZapine  2.5 mg Oral QHS   pantoprazole  40 mg Oral Daily   traZODone  50 mg Oral QHS   Continuous Infusions: PRN Meds:.acetaminophen, haloperidol **OR** haloperidol lactate, traZODone Medications Prior to Admission:  Prior to Admission medications   Medication Sig Start Date End Date Taking? Authorizing Provider  citalopram (CELEXA) 20 MG tablet Take 1 tablet (20 mg total) by mouth daily. 02/06/21  Yes Clapacs, Madie Reno, MD  furosemide (LASIX) 20 MG tablet Take 20 mg by mouth daily. 06/13/21  Yes [provider]  pantoprazole (PROTONIX) 40 MG tablet Take 40 mg by mouth daily.   Yes [provider]  Vitamin D, Ergocalciferol, (DRISDOL) 1.25 MG (50000 UNIT) CAPS capsule Take 50,000 Units by mouth once a week. 06/06/21  Yes [provider]  acetaminophen (TYLENOL) 325 MG tablet Take 650 mg by mouth every 4 (four) hours as needed for mild pain.    [provider]  atorvastatin (LIPITOR) 20 MG tablet Take 20 mg by mouth daily. 06/13/21   [provider]  HYDROcodone-acetaminophen (HYCET) 7.5-325 mg/15 ml solution Take 5 mLs by mouth 4 (four) times daily as needed for severe pain. Patient not taking: No sig reported 07/10/21 07/10/22  Merlyn Lot, MD  lidocaine (LIDODERM) 5 % Place 1 patch onto the skin every 12 (twelve) hours. Remove & Discard patch within 12 hours or as directed by MD 07/10/21 07/10/22  Merlyn Lot, MD  OLANZapine (ZYPREXA) 2.5 MG tablet Take 1 tablet (2.5 mg total) by mouth at bedtime. 02/05/21   Clapacs, Madie Reno, MD  traZODone (DESYREL) 100 MG tablet Take 100 mg by  mouth at bedtime. 07/16/21   [provider]  traZODone (DESYREL) 50 MG tablet Take 50 mg by mouth at bedtime as needed for sleep. Patient not taking: No sig reported 05/14/21   [provider]   Allergies  Allergen Reactions   Alendronate     Other reaction(s): Other (See Comments) Poor renal function.   Review of Systems  Unable to perform ROS  Physical Exam Pulmonary:     Effort: Pulmonary effort is  normal.  Neurological:     Mental Status: She is alert.    Vital Signs: BP 130/79 (BP Location: Left Arm)   Pulse 83   Temp 97.9 F (36.6 C)   Resp 16   Ht '5\' 11"'$  (1.803 m)   Wt 85 kg   SpO2 100%   BMI 26.14 kg/m  Pain Scale: 0-10   Pain Score: Asleep   SpO2: SpO2: 100 % O2 Device:SpO2: 100 % O2 Flow Rate: .   IO: Intake/output summary:  Intake/Output Summary (Last 24 hours) at 07/18/2021 1403 Last data filed at 07/18/2021 0010 Gross per 24 hour  Intake 250 ml  Output --  Net 250 ml    LBM:   Baseline Weight: Weight: 85 kg Most recent weight: Weight: 85 kg       Time In: 1:25 Time Out: 2:15 Time Total: 50 min Greater than 50%  of this time was spent counseling and coordinating care related to the above assessment and plan.  Signed by: Asencion Gowda, NP   Please contact Palliative Medicine Team phone at [phone] for questions and concerns.  For individual provider: See Shea Evans

## 2021-07-19 DIAGNOSIS — Z79899 Other long term (current) drug therapy: Secondary | ICD-10-CM | POA: Diagnosis not present

## 2021-07-19 DIAGNOSIS — I5032 Chronic diastolic (congestive) heart failure: Secondary | ICD-10-CM | POA: Diagnosis present

## 2021-07-19 DIAGNOSIS — Z9181 History of falling: Secondary | ICD-10-CM | POA: Diagnosis not present

## 2021-07-19 DIAGNOSIS — G309 Alzheimer's disease, unspecified: Secondary | ICD-10-CM | POA: Diagnosis not present

## 2021-07-19 DIAGNOSIS — N179 Acute kidney failure, unspecified: Secondary | ICD-10-CM | POA: Diagnosis present

## 2021-07-19 DIAGNOSIS — Z7189 Other specified counseling: Secondary | ICD-10-CM | POA: Diagnosis not present

## 2021-07-19 DIAGNOSIS — R296 Repeated falls: Secondary | ICD-10-CM

## 2021-07-19 DIAGNOSIS — E1122 Type 2 diabetes mellitus with diabetic chronic kidney disease: Secondary | ICD-10-CM | POA: Diagnosis present

## 2021-07-19 DIAGNOSIS — I248 Other forms of acute ischemic heart disease: Secondary | ICD-10-CM | POA: Diagnosis not present

## 2021-07-19 DIAGNOSIS — W1830XA Fall on same level, unspecified, initial encounter: Secondary | ICD-10-CM | POA: Diagnosis present

## 2021-07-19 DIAGNOSIS — F0281 Dementia in other diseases classified elsewhere with behavioral disturbance: Secondary | ICD-10-CM | POA: Diagnosis not present

## 2021-07-19 DIAGNOSIS — E785 Hyperlipidemia, unspecified: Secondary | ICD-10-CM | POA: Diagnosis present

## 2021-07-19 DIAGNOSIS — N184 Chronic kidney disease, stage 4 (severe): Secondary | ICD-10-CM | POA: Diagnosis present

## 2021-07-19 DIAGNOSIS — W19XXXA Unspecified fall, initial encounter: Secondary | ICD-10-CM | POA: Diagnosis not present

## 2021-07-19 DIAGNOSIS — S0091XA Abrasion of unspecified part of head, initial encounter: Secondary | ICD-10-CM | POA: Diagnosis present

## 2021-07-19 DIAGNOSIS — Z20822 Contact with and (suspected) exposure to covid-19: Secondary | ICD-10-CM | POA: Diagnosis present

## 2021-07-19 DIAGNOSIS — W19XXXD Unspecified fall, subsequent encounter: Secondary | ICD-10-CM | POA: Diagnosis not present

## 2021-07-19 DIAGNOSIS — Z888 Allergy status to other drugs, medicaments and biological substances status: Secondary | ICD-10-CM | POA: Diagnosis not present

## 2021-07-19 DIAGNOSIS — F0391 Unspecified dementia with behavioral disturbance: Secondary | ICD-10-CM | POA: Diagnosis present

## 2021-07-19 DIAGNOSIS — I130 Hypertensive heart and chronic kidney disease with heart failure and stage 1 through stage 4 chronic kidney disease, or unspecified chronic kidney disease: Secondary | ICD-10-CM | POA: Diagnosis present

## 2021-07-19 DIAGNOSIS — N390 Urinary tract infection, site not specified: Secondary | ICD-10-CM | POA: Diagnosis present

## 2021-07-19 DIAGNOSIS — R55 Syncope and collapse: Secondary | ICD-10-CM | POA: Diagnosis present

## 2021-07-19 DIAGNOSIS — Y92010 Kitchen of single-family (private) house as the place of occurrence of the external cause: Secondary | ICD-10-CM | POA: Diagnosis not present

## 2021-07-19 MED ORDER — SODIUM CHLORIDE 0.9% FLUSH
10.0000 mL | Freq: Two times a day (BID) | INTRAVENOUS | Status: DC
  Administered 2021-07-19 – 2021-07-23 (×8): 10 mL via INTRAVENOUS
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
## 2021-07-19 NOTE — Progress Notes (Signed)
PROGRESS NOTE    [name] [name]  [id number] DOB: [birth year] DOA: 07/17/2021 PCP: Beckie Salts, MD    Brief Narrative:  87 y.o. female with medical history significant for Dementia, hypertension, chronic diastolic heart failure, type 2 diabetes, CKD stage IV, depression and obesity who presents with concerns of fall versus syncope.   Patient recently evaluated in the ED on 8/31 with a fall versus syncope.  She was found to have left fifth and sixth rib anterior fracture.  Also noted to have UTI.  Patient was offered admission but declined and was sent home with Keflex.   Prior to that, she had a recent prolonged admission from 8/9-8/23 due to unsafe discharge plan since granddaughter at home was unable to care for her.  She was admitted at that time for syncope with collapse.  Had reassuring TTE with EF of 50 to 55% and grade 2 diastolic dysfunction on [id number].  Had amlodipine and pregabalin discontinued at discharge.   Today patient reportedly presented after she was found by her granddaughter to be on the ground with loss of consciousness.  Patient unable to provide history.  No family bedside.  Patient remains somewhat delirious and occasionally agitated but overall has been cooperative with assessments and care.   Assessment & Plan:   Principal Problem:   Fall Active Problems:   Diabetes mellitus type 2, diet-controlled ([place])   HTN (hypertension)   Acute on chronic renal failure (HCC)   Agitation   Dementia with behavioral disturbance (HCC)  Questionable fall/syncope Unwitnessed fall at home Recent admission for similar symptoms Normal echocardiogram at that time Norvasc and pregabalin discontinued at time of previous discharge Plan: Fall precautions Therapy evaluation, rec SNF TOC consult for placement Patient with recurrent falls, may be a good candidate for LTC vs memory care unit   Agitation/delirium Reported dementia Alert only to self at this time Plan: As needed IM  Zyprexa   AKI on CKD stage IV Creatinine appears at or near baseline Mild elevation corrected after administration of IV fluids   Elevated troponin Peaked at 49.  Not indicative of ACS Supply demand ischemia suspected in setting of chronic kidney disease   Type 2 diabetes Low intensity SSI   Hypertension Restart home medications   dementia Patient alert oriented to self only.  I believe this to be her baseline.  She has advanced dementia.  I spoke at length with the patient's granddaughter.  I strongly recommended consideration for DNR status.  I stated that a code event would likely not result and preservation of life and would ultimately put the patient through suffering and her last moments.  The granddaughter agreed with my assessment however stated that she must be with her mother prior to make any decisions. - Palliative care meeting scheduled for 9/9   DVT prophylaxis: SQ Lovenox Code Status: Full Family Communication: Granddaughter Iran Ouch [phone] on 9/8 Disposition Plan: Status is: Observation  The patient will require care spanning > 2 midnights and should be moved to inpatient because: Altered mental status and Inpatient level of care appropriate due to severity of illness  Dispo: The patient is from: Home              Anticipated d/c is to: SNF              Patient currently is not medically stable to d/c.   Difficult to place patient No  Patient presented with recurrent falls and delirium superimposed on underlying dementia.  Home is unsafe  disposition.  Therapy is recommended skilled nursing facility.  TOC consult in place.  Palliative care consult requested and pending.             Level of care: Med-Surg  Consultants:  Palliative care  Procedures:  None  Antimicrobials:  None   Subjective: Patient seen and examined.  Pleasant and cooperative on my evaluation.  No apparent distress.  Objective: Vitals:   07/18/21 1339 07/18/21 2048  07/19/21 0606 07/19/21 0830  BP: 130/79 (!) 112/51 139/75 115/74  Pulse: 83 (!) 53 71 (!) 51  Resp: '16 20 16 16  '$ Temp: 97.9 F (36.6 C) (!) 97.4 F (36.3 C) 97.7 F (36.5 C) 97.6 F (36.4 C)  TempSrc:      SpO2: 100% 100% 98% 94%  Weight:      Height:        Intake/Output Summary (Last 24 hours) at 07/19/2021 1021 Last data filed at 07/18/2021 2100 Gross per 24 hour  Intake 840 ml  Output --  Net 840 ml   Filed Weights   07/17/21 1346  Weight: 85 kg    Examination:  General exam: No acute distress Respiratory system: Lungs clear.  Normal work of breathing.  Room air Cardiovascular system: S1-S2, RRR, no pedal edema Gastrointestinal system: Soft, NT/ND, normal bowel sounds Central nervous system: Alert x1.  No focal deficits Extremities: Symmetric 5 x 5 power. Skin: No rashes, lesions or ulcers Psychiatry: Judgement and insight appear impaired. Mood & affect flattened.     Data Reviewed: I have personally reviewed following labs and imaging studies  CBC: Recent Labs  Lab 07/17/21 1915  WBC 8.2  HGB 11.2*  HCT 36.0  MCV 96.3  PLT 0000000   Basic Metabolic Panel: Recent Labs  Lab 07/17/21 1915 07/18/21 0742  NA 138 143  K 4.9 5.1  CL 105 107  CO2 25 27  GLUCOSE 145* 125*  BUN 54* 51*  CREATININE 2.50* 2.37*  CALCIUM 9.3 9.5   GFR: Estimated Creatinine Clearance: 19.4 mL/min (A) (by C-G formula based on SCr of 2.37 mg/dL (H)). Liver Function Tests: No results for input(s): AST, ALT, ALKPHOS, BILITOT, PROT, ALBUMIN in the last 168 hours. No results for input(s): LIPASE, AMYLASE in the last 168 hours. No results for input(s): AMMONIA in the last 168 hours. Coagulation Profile: No results for input(s): INR, PROTIME in the last 168 hours. Cardiac Enzymes: No results for input(s): CKTOTAL, CKMB, CKMBINDEX, TROPONINI in the last 168 hours. BNP (last 3 results) No results for input(s): PROBNP in the last 8760 hours. HbA1C: No results for input(s): HGBA1C in  the last 72 hours. CBG: No results for input(s): GLUCAP in the last 168 hours. Lipid Profile: No results for input(s): CHOL, HDL, LDLCALC, TRIG, CHOLHDL, LDLDIRECT in the last 72 hours. Thyroid Function Tests: No results for input(s): TSH, T4TOTAL, FREET4, T3FREE, THYROIDAB in the last 72 hours. Anemia Panel: Recent Labs    07/18/21 0742  VITAMINB12 200  FOLATE 12.8   Sepsis Labs: No results for input(s): PROCALCITON, LATICACIDVEN in the last 168 hours.  Recent Results (from the past 240 hour(s))  Resp Panel by RT-PCR (Flu A&B, Covid) Nasopharyngeal Swab     Status: None   Collection Time: 07/17/21 11:06 PM   Specimen: Nasopharyngeal Swab; Nasopharyngeal(NP) swabs in vial transport medium  Result Value Ref Range Status   SARS Coronavirus 2 by RT PCR NEGATIVE NEGATIVE Final    Comment: (NOTE) SARS-CoV-2 target nucleic acids are NOT DETECTED.  The SARS-CoV-2  RNA is generally detectable in upper respiratory specimens during the acute phase of infection. The lowest concentration of SARS-CoV-2 viral copies this assay can detect is 138 copies/mL. A negative result does not preclude SARS-Cov-2 infection and should not be used as the sole basis for treatment or other patient management decisions. A negative result may occur with  improper specimen collection/handling, submission of specimen other than nasopharyngeal swab, presence of viral mutation(s) within the areas targeted by this assay, and inadequate number of viral copies(<138 copies/mL). A negative result must be combined with clinical observations, patient history, and epidemiological information. The expected result is Negative.  Fact Sheet for Patients:  EntrepreneurPulse.com.au  Fact Sheet for Healthcare Providers:  IncredibleEmployment.be  This test is no t yet approved or cleared by the [place] FDA and  has been authorized for detection and/or diagnosis of SARS-CoV-2 by FDA  under an Emergency Use Authorization (EUA). This EUA will remain  in effect (meaning this test can be used) for the duration of the COVID-19 declaration under Section 564(b)(1) of the Act, 21 U.S.C.section 360bbb-3(b)(1), unless the authorization is terminated  or revoked sooner.       Influenza A by PCR NEGATIVE NEGATIVE Final   Influenza B by PCR NEGATIVE NEGATIVE Final    Comment: (NOTE) The Xpert Xpress SARS-CoV-2/FLU/RSV plus assay is intended as an aid in the diagnosis of influenza from Nasopharyngeal swab specimens and should not be used as a sole basis for treatment. Nasal washings and aspirates are unacceptable for Xpert Xpress SARS-CoV-2/FLU/RSV testing.  Fact Sheet for Patients: EntrepreneurPulse.com.au  Fact Sheet for Healthcare Providers: IncredibleEmployment.be  This test is not yet approved or cleared by the [place] FDA and has been authorized for detection and/or diagnosis of SARS-CoV-2 by FDA under an Emergency Use Authorization (EUA). This EUA will remain in effect (meaning this test can be used) for the duration of the COVID-19 declaration under Section 564(b)(1) of the Act, 21 U.S.C. section 360bbb-3(b)(1), unless the authorization is terminated or revoked.  Performed at [facility], [address] [place], [place] [address]          Radiology Studies: CT HEAD WO CONTRAST (5MM)  Result Date: 07/17/2021 CLINICAL DATA:  Head trauma, multiple falls EXAM: CT HEAD WITHOUT CONTRAST TECHNIQUE: Contiguous axial images were obtained from the base of the skull through the vertex without intravenous contrast. COMPARISON:  07/10/2021 FINDINGS: Brain: No evidence of acute infarction, hemorrhage, cerebral edema, mass, mass effect, or midline shift. Ventricles and sulci are concordant. No extra-axial fluid collection. Periventricular white matter changes, likely the sequela of chronic small vessel ischemic disease.  Vascular: No hyperdense vessel or unexpected calcification. Skull: Remote bilateral lamina papyracea fractures. Negative for acute fracture or focal lesion. Sinuses/Orbits: Mucosal thickening in the ethmoid air cells. Status post bilateral lens replacements. Left scleral band. Other: The mastoid air cells are well aerated. Edema in the right frontal scalp. IMPRESSION: No acute intracranial process. Electronically Signed   By: Merilyn Baba M.D.   On: 07/17/2021 17:33   CT Cervical Spine Wo Contrast  Result Date: 07/17/2021 CLINICAL DATA:  Multiple falls this week. Small abrasion along the head. Dementia. EXAM: CT CERVICAL SPINE WITHOUT CONTRAST TECHNIQUE: Multidetector CT imaging of the cervical spine was performed without intravenous contrast. Multiplanar CT image reconstructions were also generated. COMPARISON:  07/10/2021 FINDINGS: Alignment: Stable straightening of the cervical lordosis. Skull base and vertebrae: Solid interbody fusion at C4-C5-C6 potentially with partial interbody fusion at C6-7. Notable loss of intervertebral disc  height at the remaining cervical vertebral levels and in the upper thoracic spine. Fused facet joints at C4-5 bilaterally. I do not discern a cervical spine fracture or acute bony abnormality. Soft tissues and spinal canal: Atherosclerosis is present, including the carotid arteries and aortic arch. Disc levels: Uncinate and facet spurring contribute to osseous foraminal stenosis on the left at C3-4, C4-5, C5-6, and C6-7; and on the right at C3-4 and potentially T1-2. Posterior osseous ridging at C3-4 may be contributing to mild central narrowing of the thecal sac, unchanged. Upper chest: 3 mm left apical pulmonary nodule on image 79 series 2, no change from 07/10/2021 where this was part of scattered regional nodularity both upper lobes. Other: Degenerative sternoclavicular arthropathy bilaterally. IMPRESSION: 1. No acute cervical spine findings. 2. Stable appearance of multilevel  interbody fusion and multilevel osseous foraminal narrowing due to spurring. 3.  Aortic Atherosclerosis (ICD10-I70.0). Electronically Signed   By: Van Clines M.D.   On: 07/17/2021 17:39        Scheduled Meds:  citalopram  20 mg Oral Daily   enoxaparin (LOVENOX) injection  30 mg Subcutaneous Q24H   furosemide  20 mg Oral Daily   OLANZapine  2.5 mg Oral QHS   pantoprazole  40 mg Oral Daily   traZODone  50 mg Oral QHS   Continuous Infusions:   LOS: 0 days    Time spent: 25 minutes    Sidney Ace, MD [facility] Pager [phone]  If 7PM-7AM, please contact night-coverage 07/19/2021, 10:21 AM
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
## 2021-07-20 NOTE — Progress Notes (Signed)
PROGRESS NOTE    [name] [name]  [id number] DOB: 05/25/1930 DOA: 07/17/2021 PCP: Beckie Salts, MD    Brief Narrative:  87 y.o. female with medical history significant for Dementia, hypertension, chronic diastolic heart failure, type 2 diabetes, CKD stage IV, depression and obesity who presents with concerns of fall versus syncope.   Patient recently evaluated in the ED on 8/31 with a fall versus syncope.  She was found to have left fifth and sixth rib anterior fracture.  Also noted to have UTI.  Patient was offered admission but declined and was sent home with Keflex.   Prior to that, she had a recent prolonged admission from 8/9-8/23 due to unsafe discharge plan since granddaughter at home was unable to care for her.  She was admitted at that time for syncope with collapse.  Had reassuring TTE with EF of 50 to 55% and grade 2 diastolic dysfunction on [id number].  Had amlodipine and pregabalin discontinued at discharge.   Today patient reportedly presented after she was found by her granddaughter to be on the ground with loss of consciousness.  Patient unable to provide history.  No family bedside.  Patient remains somewhat delirious and occasionally agitated but overall has been cooperative with assessments and care.   Assessment & Plan:   Principal Problem:   Fall Active Problems:   Diabetes mellitus type 2, diet-controlled ([place])   HTN (hypertension)   Acute on chronic renal failure (HCC)   Agitation   Dementia with behavioral disturbance (HCC)   Frequent falls  Questionable fall/syncope Unwitnessed fall at home Recent admission for similar symptoms Normal echocardiogram at that time Norvasc and pregabalin discontinued at time of previous discharge Plan: Fall precautions Therapy evaluation, rec SNF TOC consult for placement Patient with recurrent falls, may be a good candidate for LTC vs memory care unit Bed search initiated   Agitation/delirium Reported dementia Alert only to  self at this time Plan: As needed IM Zyprexa   AKI on CKD stage IV Creatinine appears at or near baseline Mild elevation corrected after administration of IV fluids   Elevated troponin Peaked at 49.  Not indicative of ACS Supply demand ischemia suspected in setting of chronic kidney disease   Type 2 diabetes Low intensity SSI   Hypertension Restart home medications   dementia Patient alert oriented to self only.  I believe this to be her baseline.  She has advanced dementia.  I spoke at length with the patient's granddaughter.  I strongly recommended consideration for DNR status.  I stated that a code event would likely not result and preservation of life and would ultimately put the patient through suffering and her last moments.  The granddaughter agreed with my assessment however stated that she must be with her mother prior to make any decisions. -Palliative care had family meeting with the daughter and granddaughter.  At this point patient's family does not want to address anything negative in regards to goals of care.  At this time patient is medically stable for discharge.  TOC aware and looking for disposition plan.   DVT prophylaxis: SQ Lovenox Code Status: Full Family Communication: Claude Manges [phone] on 9/8 Disposition Plan: Status is: Inpatient  Remains inpatient appropriate because:Unsafe d/c plan  Dispo: The patient is from: Home              Anticipated d/c is to: SNF              Patient currently is medically stable to d/c.  Difficult to place patient No        Patient presented with recurrent falls and delirium superimposed on underlying dementia.  Home is unsafe disposition.  Therapy is recommended skilled nursing facility.  TOC consult in place.  Palliative care consult completed.  Patient is medically stable for discharge at this time.             Level of care: Med-Surg  Consultants:  Palliative care  Procedures:   None  Antimicrobials:  None   Subjective: Patient seen and examined.  Pleasant and cooperative on my evaluation.  No apparent distress.  Objective: Vitals:   07/19/21 1704 07/19/21 1933 07/20/21 0659 07/20/21 0736  BP: (!) 101/47 (!) 104/57 138/75 128/60  Pulse: (!) 55 (!) 55 (!) 58 (!) 47  Resp: '16 15 18 14  '$ Temp: 98.4 F (36.9 C) 98.5 F (36.9 C) 97.8 F (36.6 C) 97.6 F (36.4 C)  TempSrc:  Oral    SpO2: 99% 100%  100%  Weight:      Height:        Intake/Output Summary (Last 24 hours) at 07/20/2021 0947 Last data filed at 07/19/2021 1900 Gross per 24 hour  Intake --  Output 600 ml  Net -600 ml   Filed Weights   07/17/21 1346  Weight: 85 kg    Examination:  General exam: No acute distress Respiratory system: Lungs clear.  Normal work of breathing.  Room air Cardiovascular system: S1-S2, RRR, no pedal edema Gastrointestinal system: Soft, NT/ND, normal bowel sounds Central nervous system: Alert x1.  No focal deficits Extremities: Symmetric 5 x 5 power. Skin: No rashes, lesions or ulcers Psychiatry: Judgement and insight appear impaired. Mood & affect flattened.     Data Reviewed: I have personally reviewed following labs and imaging studies  CBC: Recent Labs  Lab 07/17/21 1915  WBC 8.2  HGB 11.2*  HCT 36.0  MCV 96.3  PLT 0000000   Basic Metabolic Panel: Recent Labs  Lab 07/17/21 1915 07/18/21 0742  NA 138 143  K 4.9 5.1  CL 105 107  CO2 25 27  GLUCOSE 145* 125*  BUN 54* 51*  CREATININE 2.50* 2.37*  CALCIUM 9.3 9.5   GFR: Estimated Creatinine Clearance: 19.4 mL/min (A) (by C-G formula based on SCr of 2.37 mg/dL (H)). Liver Function Tests: No results for input(s): AST, ALT, ALKPHOS, BILITOT, PROT, ALBUMIN in the last 168 hours. No results for input(s): LIPASE, AMYLASE in the last 168 hours. No results for input(s): AMMONIA in the last 168 hours. Coagulation Profile: No results for input(s): INR, PROTIME in the last 168 hours. Cardiac  Enzymes: No results for input(s): CKTOTAL, CKMB, CKMBINDEX, TROPONINI in the last 168 hours. BNP (last 3 results) No results for input(s): PROBNP in the last 8760 hours. HbA1C: No results for input(s): HGBA1C in the last 72 hours. CBG: No results for input(s): GLUCAP in the last 168 hours. Lipid Profile: No results for input(s): CHOL, HDL, LDLCALC, TRIG, CHOLHDL, LDLDIRECT in the last 72 hours. Thyroid Function Tests: No results for input(s): TSH, T4TOTAL, FREET4, T3FREE, THYROIDAB in the last 72 hours. Anemia Panel: Recent Labs    07/18/21 0742  VITAMINB12 200  FOLATE 12.8   Sepsis Labs: No results for input(s): PROCALCITON, LATICACIDVEN in the last 168 hours.  Recent Results (from the past 240 hour(s))  Resp Panel by RT-PCR (Flu A&B, Covid) Nasopharyngeal Swab     Status: None   Collection Time: 07/17/21 11:06 PM   Specimen: Nasopharyngeal Swab; Nasopharyngeal(NP)  swabs in vial transport medium  Result Value Ref Range Status   SARS Coronavirus 2 by RT PCR NEGATIVE NEGATIVE Final    Comment: (NOTE) SARS-CoV-2 target nucleic acids are NOT DETECTED.  The SARS-CoV-2 RNA is generally detectable in upper respiratory specimens during the acute phase of infection. The lowest concentration of SARS-CoV-2 viral copies this assay can detect is 138 copies/mL. A negative result does not preclude SARS-Cov-2 infection and should not be used as the sole basis for treatment or other patient management decisions. A negative result may occur with  improper specimen collection/handling, submission of specimen other than nasopharyngeal swab, presence of viral mutation(s) within the areas targeted by this assay, and inadequate number of viral copies(<138 copies/mL). A negative result must be combined with clinical observations, patient history, and epidemiological information. The expected result is Negative.  Fact Sheet for Patients:  EntrepreneurPulse.com.au  Fact Sheet  for Healthcare Providers:  IncredibleEmployment.be  This test is no t yet approved or cleared by the [place] FDA and  has been authorized for detection and/or diagnosis of SARS-CoV-2 by FDA under an Emergency Use Authorization (EUA). This EUA will remain  in effect (meaning this test can be used) for the duration of the COVID-19 declaration under Section 564(b)(1) of the Act, 21 U.S.C.section 360bbb-3(b)(1), unless the authorization is terminated  or revoked sooner.       Influenza A by PCR NEGATIVE NEGATIVE Final   Influenza B by PCR NEGATIVE NEGATIVE Final    Comment: (NOTE) The Xpert Xpress SARS-CoV-2/FLU/RSV plus assay is intended as an aid in the diagnosis of influenza from Nasopharyngeal swab specimens and should not be used as a sole basis for treatment. Nasal washings and aspirates are unacceptable for Xpert Xpress SARS-CoV-2/FLU/RSV testing.  Fact Sheet for Patients: EntrepreneurPulse.com.au  Fact Sheet for Healthcare Providers: IncredibleEmployment.be  This test is not yet approved or cleared by the [place] FDA and has been authorized for detection and/or diagnosis of SARS-CoV-2 by FDA under an Emergency Use Authorization (EUA). This EUA will remain in effect (meaning this test can be used) for the duration of the COVID-19 declaration under Section 564(b)(1) of the Act, 21 U.S.C. section 360bbb-3(b)(1), unless the authorization is terminated or revoked.  Performed at [facility], [address] [place], [place] [address]          Radiology Studies: No results found.      Scheduled Meds:  citalopram  20 mg Oral Daily   enoxaparin (LOVENOX) injection  30 mg Subcutaneous Q24H   furosemide  20 mg Oral Daily   OLANZapine  2.5 mg Oral QHS   pantoprazole  40 mg Oral Daily   sodium chloride flush  10 mL Intravenous Q12H   traZODone  50 mg Oral QHS   Continuous Infusions:    LOS: 1 day    Time spent: 15 minutes    Sidney Ace, MD [facility] Pager [phone]  If 7PM-7AM, please contact night-coverage 07/20/2021, 9:47 AM

## 2021-07-20 NOTE — TOC Progression Note (Signed)
Transition of Care [facility]) - Progression Note    Patient Details  Name: [name] [name] MRN: [id number] Date of Birth: 01-29-1930  Transition of Care [facility]) CM/SW Contact  Zigmund Daniel Dorian Pod, RN Phone Number:[phone] 07/20/2021, 2:09 PM  Clinical Narrative:    Spoke with granddaughter Bunnie Domino concerning the available bed offered at [facility]. No other beds offered at this time. Family updated and will discussed and alert TOC team once discussed.   TOC team will remains available for ongoing discharge needs.   Expected Discharge Plan: [place] Barriers to Discharge: Continued Medical Work up  Expected Discharge Plan and Services Expected Discharge Plan: [place]   Discharge Planning Services: CM Consult Post Acute Care Choice: [place] Living arrangements for the past 2 months: Single Family Home                 DME Arranged: N/A DME Agency: NA       HH Arranged: NA HH Agency: NA         Social Determinants of Health (SDOH) Interventions    Readmission Risk Interventions No flowsheet data found.

## 2021-07-21 LAB — GLUCOSE, CAPILLARY
Glucose-Capillary: 176 mg/dL — ABNORMAL HIGH (ref 70–99)
Glucose-Capillary: 108 mg/dL — ABNORMAL HIGH (ref 70–99)

## 2021-07-21 NOTE — Progress Notes (Signed)
PROGRESS NOTE    [name] [name]  [id number] DOB: [birth year] DOA: 07/17/2021 PCP: Beckie Salts, MD    Brief Narrative:  87 y.o. female with medical history significant for Dementia, hypertension, chronic diastolic heart failure, type 2 diabetes, CKD stage IV, depression and obesity who presents with concerns of fall versus syncope.   Patient recently evaluated in the ED on 8/31 with a fall versus syncope.  She was found to have left fifth and sixth rib anterior fracture.  Also noted to have UTI.  Patient was offered admission but declined and was sent home with Keflex.   Prior to that, she had a recent prolonged admission from 8/9-8/23 due to unsafe discharge plan since granddaughter at home was unable to care for her.  She was admitted at that time for syncope with collapse.  Had reassuring TTE with EF of 50 to 55% and grade 2 diastolic dysfunction on [id number].  Had amlodipine and pregabalin discontinued at discharge.   Today patient reportedly presented after she was found by her granddaughter to be on the ground with loss of consciousness.  Patient unable to provide history.  No family bedside.  Patient remains somewhat delirious and occasionally agitated but overall has been cooperative with assessments and care.   Assessment & Plan:   Principal Problem:   Fall Active Problems:   Diabetes mellitus type 2, diet-controlled ([place])   HTN (hypertension)   Acute on chronic renal failure (HCC)   Agitation   Dementia with behavioral disturbance (HCC)   Frequent falls  Questionable fall/syncope Unwitnessed fall at home Recent admission for similar symptoms Normal echocardiogram at that time Norvasc and pregabalin discontinued at time of previous discharge Plan: Fall precautions Therapy evaluation, rec SNF [facility] consult for placement Patient with recurrent falls, may be a good candidate for LTC vs memory care unit Bed search initiated Offer made by [facility] healthcare.  Family made aware  and will notify TOC on their decision Patient is medically stable for discharge at this time   Agitation/delirium Reported dementia Alert only to self at this time Plan: As needed IM Zyprexa   AKI on CKD stage IV Creatinine appears at or near baseline Mild elevation corrected after administration of IV fluids   Elevated troponin Peaked at 49.  Not indicative of ACS Supply demand ischemia suspected in setting of chronic kidney disease   Type 2 diabetes Low intensity SSI   Hypertension Restart home medications   dementia Patient alert oriented to self only.  I believe this to be her baseline.  She has advanced dementia.  I spoke at length with the patient's granddaughter.  I strongly recommended consideration for DNR status.  I stated that a code event would likely not result and preservation of life and would ultimately put the patient through suffering and her last moments.  The granddaughter agreed with my assessment however stated that she must be with her mother prior to make any decisions. -Palliative care had family meeting with the daughter and granddaughter.  At this point patient's family does not want to address anything negative in regards to goals of care.  At this time patient is medically stable for discharge.  TOC aware and looking for disposition plan.   DVT prophylaxis: SQ Lovenox Code Status: Full Family Communication: Claude Manges [phone] on 9/8 Disposition Plan: Status is: Inpatient  Remains inpatient appropriate because:Unsafe d/c plan  Dispo: The patient is from: Home              Anticipated  d/c is to: SNF              Patient currently is medically stable to d/c.   Difficult to place patient No     Patient presented with recurrent falls and delirium superimposed on underlying dementia.  Home is unsafe disposition.  Therapy is recommended skilled nursing facility.  TOC consult in place.  Palliative care consult completed.  Patient is  medically stable for discharge at this time.  Bed offer from [facility].  TOC to follow up with family             Level of care: Med-Surg  Consultants:  Palliative care  Procedures:  None  Antimicrobials:  None   Subjective: Patient seen and examined.  Pleasant and cooperative on my evaluation.  No apparent distress.  Objective: Vitals:   07/20/21 1542 07/20/21 1920 07/21/21 0634 07/21/21 0751  BP: 135/76 127/64 129/72 101/63  Pulse: (!) 53 63 80 83  Resp: '18 16 17 18  '$ Temp: 97.8 F (36.6 C) 98.2 F (36.8 C) 97.7 F (36.5 C) 98.3 F (36.8 C)  TempSrc:    Oral  SpO2: 100% 100% 100% 93%  Weight:      Height:        Intake/Output Summary (Last 24 hours) at 07/21/2021 1004 Last data filed at 07/21/2021 0900 Gross per 24 hour  Intake 900 ml  Output 1000 ml  Net -100 ml   Filed Weights   07/17/21 1346  Weight: 85 kg    Examination:  General exam: No acute distress Respiratory system: Lungs clear.  Normal work of breathing.  Room air Cardiovascular system: S1-S2, RRR, no pedal edema Gastrointestinal system: Soft, NT/ND, normal bowel sounds Central nervous system: Alert x1.  No focal deficits Extremities: Symmetric 5 x 5 power. Skin: No rashes, lesions or ulcers Psychiatry: Judgement and insight appear impaired. Mood & affect flattened.     Data Reviewed: I have personally reviewed following labs and imaging studies  CBC: Recent Labs  Lab 07/17/21 1915  WBC 8.2  HGB 11.2*  HCT 36.0  MCV 96.3  PLT 0000000   Basic Metabolic Panel: Recent Labs  Lab 07/17/21 1915 07/18/21 0742  NA 138 143  K 4.9 5.1  CL 105 107  CO2 25 27  GLUCOSE 145* 125*  BUN 54* 51*  CREATININE 2.50* 2.37*  CALCIUM 9.3 9.5   GFR: Estimated Creatinine Clearance: 19.4 mL/min (A) (by C-G formula based on SCr of 2.37 mg/dL (H)). Liver Function Tests: No results for input(s): AST, ALT, ALKPHOS, BILITOT, PROT, ALBUMIN in the last 168 hours. No results for input(s): LIPASE,  AMYLASE in the last 168 hours. No results for input(s): AMMONIA in the last 168 hours. Coagulation Profile: No results for input(s): INR, PROTIME in the last 168 hours. Cardiac Enzymes: No results for input(s): CKTOTAL, CKMB, CKMBINDEX, TROPONINI in the last 168 hours. BNP (last 3 results) No results for input(s): PROBNP in the last 8760 hours. HbA1C: No results for input(s): HGBA1C in the last 72 hours. CBG: No results for input(s): GLUCAP in the last 168 hours. Lipid Profile: No results for input(s): CHOL, HDL, LDLCALC, TRIG, CHOLHDL, LDLDIRECT in the last 72 hours. Thyroid Function Tests: No results for input(s): TSH, T4TOTAL, FREET4, T3FREE, THYROIDAB in the last 72 hours. Anemia Panel: No results for input(s): VITAMINB12, FOLATE, FERRITIN, TIBC, IRON, RETICCTPCT in the last 72 hours.  Sepsis Labs: No results for input(s): PROCALCITON, LATICACIDVEN in the last 168 hours.  Recent Results (from the  past 240 hour(s))  Resp Panel by RT-PCR (Flu A&B, Covid) Nasopharyngeal Swab     Status: None   Collection Time: 07/17/21 11:06 PM   Specimen: Nasopharyngeal Swab; Nasopharyngeal(NP) swabs in vial transport medium  Result Value Ref Range Status   SARS Coronavirus 2 by RT PCR NEGATIVE NEGATIVE Final    Comment: (NOTE) SARS-CoV-2 target nucleic acids are NOT DETECTED.  The SARS-CoV-2 RNA is generally detectable in upper respiratory specimens during the acute phase of infection. The lowest concentration of SARS-CoV-2 viral copies this assay can detect is 138 copies/mL. A negative result does not preclude SARS-Cov-2 infection and should not be used as the sole basis for treatment or other patient management decisions. A negative result may occur with  improper specimen collection/handling, submission of specimen other than nasopharyngeal swab, presence of viral mutation(s) within the areas targeted by this assay, and inadequate number of viral copies(<138 copies/mL). A negative result  must be combined with clinical observations, patient history, and epidemiological information. The expected result is Negative.  Fact Sheet for Patients:  EntrepreneurPulse.com.au  Fact Sheet for Healthcare Providers:  IncredibleEmployment.be  This test is no t yet approved or cleared by the [place] FDA and  has been authorized for detection and/or diagnosis of SARS-CoV-2 by FDA under an Emergency Use Authorization (EUA). This EUA will remain  in effect (meaning this test can be used) for the duration of the COVID-19 declaration under Section 564(b)(1) of the Act, 21 U.S.C.section 360bbb-3(b)(1), unless the authorization is terminated  or revoked sooner.       Influenza A by PCR NEGATIVE NEGATIVE Final   Influenza B by PCR NEGATIVE NEGATIVE Final    Comment: (NOTE) The Xpert Xpress SARS-CoV-2/FLU/RSV plus assay is intended as an aid in the diagnosis of influenza from Nasopharyngeal swab specimens and should not be used as a sole basis for treatment. Nasal washings and aspirates are unacceptable for Xpert Xpress SARS-CoV-2/FLU/RSV testing.  Fact Sheet for Patients: EntrepreneurPulse.com.au  Fact Sheet for Healthcare Providers: IncredibleEmployment.be  This test is not yet approved or cleared by the [place] FDA and has been authorized for detection and/or diagnosis of SARS-CoV-2 by FDA under an Emergency Use Authorization (EUA). This EUA will remain in effect (meaning this test can be used) for the duration of the COVID-19 declaration under Section 564(b)(1) of the Act, 21 U.S.C. section 360bbb-3(b)(1), unless the authorization is terminated or revoked.  Performed at [facility], [address], [place], [place] [address]          Radiology Studies: No results found.      Scheduled Meds:  citalopram  20 mg Oral Daily   enoxaparin (LOVENOX) injection  30 mg  Subcutaneous Q24H   furosemide  20 mg Oral Daily   OLANZapine  2.5 mg Oral QHS   pantoprazole  40 mg Oral Daily   sodium chloride flush  10 mL Intravenous Q12H   traZODone  50 mg Oral QHS   Continuous Infusions:   LOS: 2 days    Time spent: 15 minutes    Sidney Ace, MD [facility] Pager [phone]  If 7PM-7AM, please contact night-coverage 07/21/2021, 10:04 AM

## 2021-07-22 LAB — RESP PANEL BY RT-PCR (FLU A&B, COVID) ARPGX2
Influenza A by PCR: NEGATIVE
Influenza B by PCR: NEGATIVE
SARS Coronavirus 2 by RT PCR: NEGATIVE

## 2021-07-22 LAB — GLUCOSE, CAPILLARY
Glucose-Capillary: 74 mg/dL (ref 70–99)
Glucose-Capillary: 236 mg/dL — ABNORMAL HIGH (ref 70–99)
Glucose-Capillary: 101 mg/dL — ABNORMAL HIGH (ref 70–99)

## 2021-07-22 NOTE — Progress Notes (Signed)
PROGRESS NOTE    [name] [name]  [id number] DOB: 08-10-1930 DOA: 07/17/2021 PCP: Beckie Salts, MD    Brief Narrative:  87 y.o. female with medical history significant for Dementia, hypertension, chronic diastolic heart failure, type 2 diabetes, CKD stage IV, depression and obesity who presents with concerns of fall versus syncope.   Patient recently evaluated in the ED on 8/31 with a fall versus syncope.  She was found to have left fifth and sixth rib anterior fracture.  Also noted to have UTI.  Patient was offered admission but declined and was sent home with Keflex.   Prior to that, she had a recent prolonged admission from 8/9-8/23 due to unsafe discharge plan since granddaughter at home was unable to care for her.  She was admitted at that time for syncope with collapse.  Had reassuring TTE with EF of 50 to 55% and grade 2 diastolic dysfunction on [id number].  Had amlodipine and pregabalin discontinued at discharge.   Today patient reportedly presented after she was found by her granddaughter to be on the ground with loss of consciousness.  Patient unable to provide history.  No family bedside.  Patient remains somewhat delirious and occasionally agitated but overall has been cooperative with assessments and care.   Assessment & Plan:   Principal Problem:   Fall Active Problems:   Diabetes mellitus type 2, diet-controlled ([place])   HTN (hypertension)   Acute on chronic renal failure (HCC)   Agitation   Dementia with behavioral disturbance (HCC)   Frequent falls  Questionable fall/syncope Unwitnessed fall at home Recent admission for similar symptoms Normal echocardiogram at that time Norvasc and pregabalin discontinued at time of previous discharge Plan: Fall precautions Therapy evaluation, rec SNF [facility] consult for placement Patient with recurrent falls, may be a good candidate for LTC vs memory care unit Bed search initiated Offer made by [facility] healthcare.  Family made aware  and will notify TOC on their decision Patient remains medically stable for discharge   Agitation/delirium Reported dementia Alert only to self at this time Plan: As needed IM Zyprexa   AKI on CKD stage IV Creatinine appears at or near baseline Mild elevation corrected after administration of IV fluids   Elevated troponin Peaked at 49.  Not indicative of ACS Supply demand ischemia suspected in setting of chronic kidney disease   Type 2 diabetes Low intensity SSI   Hypertension Restart home medications   dementia Patient alert oriented to self only.  I believe this to be her baseline.  She has advanced dementia.  I spoke at length with the patient's granddaughter.  I strongly recommended consideration for DNR status.  I stated that a code event would likely not result and preservation of life and would ultimately put the patient through suffering and her last moments.  The granddaughter agreed with my assessment however stated that she must be with her mother prior to make any decisions. -Palliative care had family meeting with the daughter and granddaughter.  At this point patient's family does not want to address anything negative in regards to goals of care.  At this time patient is medically stable for discharge.  TOC aware and looking for disposition plan.  Recommend outpatient palliative care referral   DVT prophylaxis: SQ Lovenox Code Status: Full Family Communication: Claude Manges [phone] on 9/8 Disposition Plan: Status is: Inpatient  Remains inpatient appropriate because:Unsafe d/c plan  Dispo: The patient is from: Home  Anticipated d/c is to: SNF              Patient currently is medically stable to d/c.   Difficult to place patient No     Patient presented with recurrent falls and delirium superimposed on underlying dementia.  Home is unsafe disposition.  Therapy is recommended skilled nursing facility.  TOC consult in place.  Palliative  care consult completed.  Patient is medically stable for discharge at this time.  Bed offer from [facility].  TOC to follow up with family             Level of care: Med-Surg  Consultants:  Palliative care  Procedures:  None  Antimicrobials:  None   Subjective: Patient seen and examined.  Pleasant and cooperative on my evaluation.  No apparent distress.  Objective: Vitals:   07/21/21 2123 07/21/21 2329 07/22/21 0349 07/22/21 0807  BP: 110/65 108/71 (!) 107/53 107/61  Pulse: (!) 55 (!) 57 64 61  Resp: '15 16 18 17  '$ Temp: 98.1 F (36.7 C) 98.4 F (36.9 C) 98.9 F (37.2 C)   TempSrc: Oral Oral Oral   SpO2: 97% 96% 100% 97%  Weight:      Height:        Intake/Output Summary (Last 24 hours) at 07/22/2021 0948 Last data filed at 07/21/2021 2338 Gross per 24 hour  Intake 480 ml  Output 150 ml  Net 330 ml   Filed Weights   07/17/21 1346  Weight: 85 kg    Examination:  General exam: No acute distress Respiratory system: Lungs clear.  Normal work of breathing.  Room air Cardiovascular system: S1-S2, RRR, no pedal edema Gastrointestinal system: Soft, NT/ND, normal bowel sounds Central nervous system: Alert x1.  No focal deficits Extremities: Symmetric 5 x 5 power. Skin: No rashes, lesions or ulcers Psychiatry: Judgement and insight appear impaired. Mood & affect flattened.     Data Reviewed: I have personally reviewed following labs and imaging studies  CBC: Recent Labs  Lab 07/17/21 1915  WBC 8.2  HGB 11.2*  HCT 36.0  MCV 96.3  PLT 0000000   Basic Metabolic Panel: Recent Labs  Lab 07/17/21 1915 07/18/21 0742  NA 138 143  K 4.9 5.1  CL 105 107  CO2 25 27  GLUCOSE 145* 125*  BUN 54* 51*  CREATININE 2.50* 2.37*  CALCIUM 9.3 9.5   GFR: Estimated Creatinine Clearance: 19.4 mL/min (A) (by C-G formula based on SCr of 2.37 mg/dL (H)). Liver Function Tests: No results for input(s): AST, ALT, ALKPHOS, BILITOT, PROT, ALBUMIN in the last 168 hours. No  results for input(s): LIPASE, AMYLASE in the last 168 hours. No results for input(s): AMMONIA in the last 168 hours. Coagulation Profile: No results for input(s): INR, PROTIME in the last 168 hours. Cardiac Enzymes: No results for input(s): CKTOTAL, CKMB, CKMBINDEX, TROPONINI in the last 168 hours. BNP (last 3 results) No results for input(s): PROBNP in the last 8760 hours. HbA1C: No results for input(s): HGBA1C in the last 72 hours. CBG: Recent Labs  Lab 07/21/21 1143 07/21/21 2102 07/22/21 0857  GLUCAP 176* 108* 74   Lipid Profile: No results for input(s): CHOL, HDL, LDLCALC, TRIG, CHOLHDL, LDLDIRECT in the last 72 hours. Thyroid Function Tests: No results for input(s): TSH, T4TOTAL, FREET4, T3FREE, THYROIDAB in the last 72 hours. Anemia Panel: No results for input(s): VITAMINB12, FOLATE, FERRITIN, TIBC, IRON, RETICCTPCT in the last 72 hours.  Sepsis Labs: No results for input(s): PROCALCITON, LATICACIDVEN in the last  168 hours.  Recent Results (from the past 240 hour(s))  Resp Panel by RT-PCR (Flu A&B, Covid) Nasopharyngeal Swab     Status: None   Collection Time: 07/17/21 11:06 PM   Specimen: Nasopharyngeal Swab; Nasopharyngeal(NP) swabs in vial transport medium  Result Value Ref Range Status   SARS Coronavirus 2 by RT PCR NEGATIVE NEGATIVE Final    Comment: (NOTE) SARS-CoV-2 target nucleic acids are NOT DETECTED.  The SARS-CoV-2 RNA is generally detectable in upper respiratory specimens during the acute phase of infection. The lowest concentration of SARS-CoV-2 viral copies this assay can detect is 138 copies/mL. A negative result does not preclude SARS-Cov-2 infection and should not be used as the sole basis for treatment or other patient management decisions. A negative result may occur with  improper specimen collection/handling, submission of specimen other than nasopharyngeal swab, presence of viral mutation(s) within the areas targeted by this assay, and  inadequate number of viral copies(<138 copies/mL). A negative result must be combined with clinical observations, patient history, and epidemiological information. The expected result is Negative.  Fact Sheet for Patients:  EntrepreneurPulse.com.au  Fact Sheet for Healthcare Providers:  IncredibleEmployment.be  This test is no t yet approved or cleared by the [place] FDA and  has been authorized for detection and/or diagnosis of SARS-CoV-2 by FDA under an Emergency Use Authorization (EUA). This EUA will remain  in effect (meaning this test can be used) for the duration of the COVID-19 declaration under Section 564(b)(1) of the Act, 21 U.S.C.section 360bbb-3(b)(1), unless the authorization is terminated  or revoked sooner.       Influenza A by PCR NEGATIVE NEGATIVE Final   Influenza B by PCR NEGATIVE NEGATIVE Final    Comment: (NOTE) The Xpert Xpress SARS-CoV-2/FLU/RSV plus assay is intended as an aid in the diagnosis of influenza from Nasopharyngeal swab specimens and should not be used as a sole basis for treatment. Nasal washings and aspirates are unacceptable for Xpert Xpress SARS-CoV-2/FLU/RSV testing.  Fact Sheet for Patients: EntrepreneurPulse.com.au  Fact Sheet for Healthcare Providers: IncredibleEmployment.be  This test is not yet approved or cleared by the [place] FDA and has been authorized for detection and/or diagnosis of SARS-CoV-2 by FDA under an Emergency Use Authorization (EUA). This EUA will remain in effect (meaning this test can be used) for the duration of the COVID-19 declaration under Section 564(b)(1) of the Act, 21 U.S.C. section 360bbb-3(b)(1), unless the authorization is terminated or revoked.  Performed at [facility], [address], [place], [place] [address]          Radiology Studies: No results found.      Scheduled Meds:  citalopram   20 mg Oral Daily   enoxaparin (LOVENOX) injection  30 mg Subcutaneous Q24H   furosemide  20 mg Oral Daily   OLANZapine  2.5 mg Oral QHS   pantoprazole  40 mg Oral Daily   sodium chloride flush  10 mL Intravenous Q12H   traZODone  50 mg Oral QHS   Continuous Infusions:   LOS: 3 days    Time spent: 15 minutes    Sidney Ace, MD [facility] Pager [phone]  If 7PM-7AM, please contact night-coverage 07/22/2021, 9:49 AM

## 2021-07-22 NOTE — Progress Notes (Signed)
Physical Therapy Treatment Patient Details Name: [name] [name] MRN: [id number] DOB: 1930-09-24 Today's Date: 07/22/2021   History of Present Illness Pt is an 87 y.o. female presenting to hospital 07/17/21 s/p fall vs syncope; pt with multiple falls over the last several weeks and recently evaluated in ED 8/31 with fall vs syncope (found to have L 5th and 6th rib anterior fx).  Pt admitted with questionable fall/syncope, agitation/delirium, AKI on CKD stage IV, and elevated troponin (suspect d/t demand ischemia).  PMH includes htn, DM, depression, thrombocytopenia, anemia, dementia and CKD.    PT Comments    Pt resting in bed upon PT arrival; agreeable to PT session.  Pt noted to be incontinent of urine in bed requiring assist to change gown (sitter notified of pt's need for sponge bath and bed change).  Min to mod assist semi-supine to sitting edge of bed; mod assist progressing to min assist with transfers using RW (2nd assist present for safety); and CGA to min assist to ambulate 90 feet with RW (with chair follow for safety).  Pt's HR 81 bpm at rest beginning of session; HR increased to 130 bpm post ambulation; and HR decreased to 85 bpm end of session at rest (nurse notified of pt's increased HR with activity).  Will continue to focus on strengthening and progressive functional mobility during hospitalization.    Recommendations for follow up therapy are one component of a multi-disciplinary discharge planning process, led by the attending physician.  Recommendations may be updated based on patient status, additional functional criteria and insurance authorization.  Follow Up Recommendations  SNF     Equipment Recommendations  Rolling walker with 5" wheels    Recommendations for Other Services       Precautions / Restrictions Precautions Precautions: Fall Restrictions Weight Bearing Restrictions: No     Mobility  Bed Mobility Overal bed mobility: Needs Assistance Bed Mobility: Supine  to Sit     Supine to sit: Min assist;Mod assist;HOB elevated     General bed mobility comments: assist for trunk    Transfers Overall transfer level: Needs assistance Equipment used: Rolling walker (2 wheeled) Transfers: Sit to/from Stand Sit to Stand: Min assist;Mod assist;+2 safety/equipment         General transfer comment: mod assist to stand 1st trial from bed but min assist to stand 2nd trial from bed; vc's for UE placement and overall technique  Ambulation/Gait Ambulation/Gait assistance: Min guard;Min assist Gait Distance (Feet): 90 Feet Assistive device: Rolling walker (2 wheeled)   Gait velocity: decreased   General Gait Details: partial step through gait pattern; flexed trunk; vc's fairly consistently to stay closer to [employer] Mobility    Modified Rankin (Stroke Patients Only)       Balance Overall balance assessment: Needs assistance Sitting-balance support: No upper extremity supported;Feet supported Sitting balance-Leahy Scale: Good Sitting balance - Comments: steady sitting reaching within BOS   Standing balance support: No upper extremity supported Standing balance-Leahy Scale: Fair Standing balance comment: steady static standing                            Cognition Arousal/Alertness: Awake/alert Behavior During Therapy:  (Confused but pleasant) Overall Cognitive Status: No family/caregiver present to determine baseline cognitive functioning  General Comments: Oriented to person only; does fairly well with simple one step cues      Exercises      General Comments  Nursing cleared pt for participation in physical therapy.  Pt agreeable to PT session.      Pertinent Vitals/Pain Pain Assessment: No/denies pain O2 sats WFL on room air during sessions activities.    Home Living                      Prior Function            PT Goals  (current goals can now be found in the care plan section) Acute Rehab PT Goals Patient Stated Goal: to go home PT Goal Formulation: With patient Time For Goal Achievement: 08/01/21 Potential to Achieve Goals: Fair Progress towards PT goals: Progressing toward goals    Frequency    Min 2X/week      PT Plan Current plan remains appropriate    Co-evaluation              AM-PAC PT "6 Clicks" Mobility   Outcome Measure  Help needed turning from your back to your side while in a flat bed without using bedrails?: A Little Help needed moving from lying on your back to sitting on the side of a flat bed without using bedrails?: A Lot Help needed moving to and from a bed to a chair (including a wheelchair)?: A Little Help needed standing up from a chair using your arms (e.g., wheelchair or bedside chair)?: A Lot Help needed to walk in hospital room?: A Little Help needed climbing 3-5 steps with a railing? : A Lot 6 Click Score: 15    End of Session Equipment Utilized During Treatment: Gait belt Activity Tolerance: Patient tolerated treatment well Patient left: in chair;with call bell/phone within reach;with chair alarm set;with nursing/sitter in room Nurse Communication: Mobility status;Precautions;Other (comment) (pt's elevated HR with activity) PT Visit Diagnosis: Other abnormalities of gait and mobility (R26.89);Muscle weakness (generalized) (M62.81);History of falling (Z91.81);Repeated falls (R29.6)     Time: [id number] PT Time Calculation (min) (ACUTE ONLY): 30 min  Charges:  $Gait Training: 8-22 mins $Therapeutic Activity: 8-22 mins                     Leitha Bleak, PT 07/22/21, 11:24 AM

## 2021-07-22 NOTE — TOC Progression Note (Signed)
Transition of Care [facility]) - Progression Note    Patient Details  Name: [name] [name] MRN: [id number] Date of Birth: Apr 26, 1930  [facility] CM/SW Contact  Shelbie Hutching, RN Phone Number: 07/22/2021, 2:49 PM  Clinical Narrative:    Patient's granddaughter [name] [name] agrees to accept the bed offer at [facility].  Plan for discharge tomorrow.  RNCM starting insurance auth with [employer].     Expected Discharge Plan: [place] Barriers to Discharge: Continued Medical Work up  Expected Discharge Plan and Services Expected Discharge Plan: [place]   Discharge Planning Services: CM Consult Post Acute Care Choice: [place] Living arrangements for the past 2 months: Single Family Home                 DME Arranged: N/A DME Agency: NA       HH Arranged: NA HH Agency: NA         Social Determinants of Health (SDOH) Interventions    Readmission Risk Interventions No flowsheet data found.

## 2021-07-22 NOTE — Progress Notes (Signed)
Patient become agitated and attempting to get out of bed. Upon entering patient room patient states, "Do you know I'm getting married today?" Patient then continues to explain that she has a hair appointment today and needs to pick up her dress so she can get married to "[name]." Patient very pleasant. Patient agreeable to having COVID swab obtained after minimal discussion on need to have swab completed in order to be discharged tomorrow.

## 2021-07-23 LAB — GLUCOSE, CAPILLARY: Glucose-Capillary: 87 mg/dL (ref 70–99)

## 2021-07-23 NOTE — Discharge Summary (Signed)
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 hospitalization (including imaging, microbiology, ancillary and laboratory) are listed below for reference.     Microbiology: Recent Results (from the past 240 hour(s))  Resp Panel by RT-PCR (Flu A&B, Covid) Nasopharyngeal Swab     Status: None   Collection Time: 07/17/21 11:06 PM   Specimen: Nasopharyngeal Swab; Nasopharyngeal(NP) swabs in vial transport medium  Result Value Ref Range Status   SARS Coronavirus 2 by RT PCR NEGATIVE NEGATIVE Final    Comment: (NOTE) SARS-CoV-2 target nucleic acids are NOT DETECTED.  The SARS-CoV-2 RNA is generally detectable in upper respiratory specimens during the acute phase of infection. The lowest concentration of SARS-CoV-2 viral copies this assay can detect is 138 copies/mL. A negative result does not preclude SARS-Cov-2 infection and should not be used as the sole basis for treatment or other patient management decisions. A negative result may occur with  improper specimen collection/handling, submission of specimen  other than nasopharyngeal swab, presence of viral mutation(s) within the areas targeted by this assay, and inadequate number of viral copies(<138 copies/mL). A negative result must be combined with clinical observations, patient history, and epidemiological information. The expected result is Negative.  Fact Sheet for Patients:  EntrepreneurPulse.com.au  Fact Sheet for Healthcare Providers:  IncredibleEmployment.be  This test is no t yet approved or cleared by the [place] FDA and  has been authorized for detection and/or diagnosis of SARS-CoV-2 by FDA under an Emergency Use Authorization (EUA). This EUA will remain  in effect (meaning this test can be used) for the duration of the COVID-19 declaration under Section 564(b)(1) of the Act, 21 U.S.C.section 360bbb-3(b)(1), unless the authorization is terminated  or revoked sooner.       Influenza A by PCR NEGATIVE NEGATIVE Final   Influenza B by PCR NEGATIVE NEGATIVE Final    Comment: (NOTE) The Xpert Xpress SARS-CoV-2/FLU/RSV plus assay is intended as an aid in the diagnosis of influenza from Nasopharyngeal swab specimens and should not be used as a sole basis for treatment. Nasal washings and aspirates are unacceptable for Xpert Xpress SARS-CoV-2/FLU/RSV testing.  Fact Sheet for Patients: EntrepreneurPulse.com.au  Fact Sheet for Healthcare Providers: IncredibleEmployment.be  This test is not yet approved or cleared by the [place] FDA and has been authorized for detection and/or diagnosis of SARS-CoV-2 by FDA under an Emergency Use Authorization (EUA). This EUA will remain in effect (meaning this test can be used) for the duration of the COVID-19 declaration under Section 564(b)(1) of the Act, 21 U.S.C. section 360bbb-3(b)(1), unless the authorization is terminated or revoked.  Performed at [facility], [address]  [place], [place] [address]   Resp Panel by RT-PCR (Flu A&B, Covid) Nasopharyngeal Swab     Status: None   Collection Time: 07/22/21  3:30 PM   Specimen: Nasopharyngeal Swab; Nasopharyngeal(NP) swabs in vial transport medium  Result Value Ref Range Status   SARS Coronavirus 2 by RT PCR NEGATIVE NEGATIVE Final    Comment: (NOTE) SARS-CoV-2 target nucleic acids are NOT DETECTED.  The SARS-CoV-2 RNA is  generally detectable in upper respiratory specimens during the acute phase of infection. The lowest concentration of SARS-CoV-2 viral copies this assay can detect is 138 copies/mL. A negative result does not preclude SARS-Cov-2 infection and should not be used as the sole basis for treatment or other patient management decisions. A negative result may occur with  improper specimen collection/handling, submission of specimen other than nasopharyngeal swab, presence of viral mutation(s) within the areas targeted by this assay, and inadequate number of viral copies(<138 copies/mL). A negative result must be combined with clinical observations, patient history, and epidemiological information. The expected result is Negative.  Fact Sheet for Patients:  EntrepreneurPulse.com.au  Fact Sheet for Healthcare Providers:  IncredibleEmployment.be  This test is no t yet approved or cleared by the [place] FDA and  has been authorized for detection and/or diagnosis of SARS-CoV-2 by FDA under an Emergency Use Authorization (EUA). This EUA will remain  in effect (meaning this test can be used) for the duration of the COVID-19 declaration under Section 564(b)(1) of the Act, 21 U.S.C.section 360bbb-3(b)(1), unless the authorization is terminated  or revoked sooner.       Influenza A by PCR NEGATIVE NEGATIVE Final   Influenza B by PCR NEGATIVE NEGATIVE Final    Comment: (NOTE) The Xpert Xpress SARS-CoV-2/FLU/RSV plus assay is intended as an aid in the diagnosis of  influenza from Nasopharyngeal swab specimens and should not be used as a sole basis for treatment. Nasal washings and aspirates are unacceptable for Xpert Xpress SARS-CoV-2/FLU/RSV testing.  Fact Sheet for Patients: EntrepreneurPulse.com.au  Fact Sheet for Healthcare Providers: IncredibleEmployment.be  This test is not yet approved or cleared by the [place] FDA and has been authorized for detection and/or diagnosis of SARS-CoV-2 by FDA under an Emergency Use Authorization (EUA). This EUA will remain in effect (meaning this test can be used) for the duration of the COVID-19 declaration under Section 564(b)(1) of the Act, 21 U.S.C. section 360bbb-3(b)(1), unless the authorization is terminated or revoked.  Performed at [facility], [address] [place], [place] [address]      Labs: BNP (last 3 results) No results for input(s): BNP in the last 8760 hours. Basic Metabolic Panel: Recent Labs  Lab 07/17/21 1915 07/18/21 0742  NA 138 143  K 4.9 5.1  CL 105 107  CO2 25 27  GLUCOSE 145* 125*  BUN 54* 51*  CREATININE 2.50* 2.37*  CALCIUM 9.3 9.5   Liver Function Tests: No results for input(s): AST, ALT, ALKPHOS, BILITOT, PROT, ALBUMIN in the last 168 hours. No results for input(s): LIPASE, AMYLASE in the last 168 hours. No results for input(s): AMMONIA in the last 168 hours. CBC: Recent Labs  Lab 07/17/21 1915  WBC 8.2  HGB 11.2*  HCT 36.0  MCV 96.3  PLT 195   Cardiac Enzymes: No results for input(s): CKTOTAL, CKMB, CKMBINDEX, TROPONINI in the last 168 hours. BNP: Invalid input(s): POCBNP CBG: Recent Labs  Lab 07/21/21 1143 07/21/21 2102 07/22/21 0857 07/22/21 1132 07/22/21 1909  GLUCAP 176* 108* 74 236* 101*   D-Dimer No results for input(s): DDIMER in the last 72 hours. Hgb A1c No results for input(s): HGBA1C in the last 72 hours. Lipid Profile No results for input(s): CHOL, HDL, LDLCALC, TRIG,  CHOLHDL, LDLDIRECT in the last 72 hours. Thyroid function studies No results for input(s): TSH, T4TOTAL, T3FREE, THYROIDAB in the last 72 hours.  Invalid input(s): FREET3 Anemia work up No results for input(s): VITAMINB12, FOLATE, FERRITIN, TIBC, IRON, RETICCTPCT  in the last 72 hours. Urinalysis    Component Value Date/Time   COLORURINE YELLOW 07/17/2021 1348   APPEARANCEUR CLEAR 07/17/2021 1348   LABSPEC 1.020 07/17/2021 1348   PHURINE 5.5 07/17/2021 1348   GLUCOSEU NEGATIVE 07/17/2021 1348   HGBUR NEGATIVE 07/17/2021 [place] 07/17/2021 1348   KETONESUR NEGATIVE 07/17/2021 1348   PROTEINUR NEGATIVE 07/17/2021 1348   UROBILINOGEN 1.0 12/30/2014 1333   NITRITE NEGATIVE 07/17/2021 1348   LEUKOCYTESUR NEGATIVE 07/17/2021 1348   Sepsis Labs Invalid input(s): PROCALCITONIN,  WBC,  LACTICIDVEN Microbiology Recent Results (from the past 240 hour(s))  Resp Panel by RT-PCR (Flu A&B, Covid) Nasopharyngeal Swab     Status: None   Collection Time: 07/17/21 11:06 PM   Specimen: Nasopharyngeal Swab; Nasopharyngeal(NP) swabs in vial transport medium  Result Value Ref Range Status   SARS Coronavirus 2 by RT PCR NEGATIVE NEGATIVE Final    Comment: (NOTE) SARS-CoV-2 target nucleic acids are NOT DETECTED.  The SARS-CoV-2 RNA is generally detectable in upper respiratory specimens during the acute phase of infection. The lowest concentration of SARS-CoV-2 viral copies this assay can detect is 138 copies/mL. A negative result does not preclude SARS-Cov-2 infection and should not be used as the sole basis for treatment or other patient management decisions. A negative result may occur with  improper specimen collection/handling, submission of specimen other than nasopharyngeal swab, presence of viral mutation(s) within the areas targeted by this assay, and inadequate number of viral copies(<138 copies/mL). A negative result must be combined with clinical observations,  patient history, and epidemiological information. The expected result is Negative.  Fact Sheet for Patients:  EntrepreneurPulse.com.au  Fact Sheet for Healthcare Providers:  IncredibleEmployment.be  This test is no t yet approved or cleared by the [place] FDA and  has been authorized for detection and/or diagnosis of SARS-CoV-2 by FDA under an Emergency Use Authorization (EUA). This EUA will remain  in effect (meaning this test can be used) for the duration of the COVID-19 declaration under Section 564(b)(1) of the Act, 21 U.S.C.section 360bbb-3(b)(1), unless the authorization is terminated  or revoked sooner.       Influenza A by PCR NEGATIVE NEGATIVE Final   Influenza B by PCR NEGATIVE NEGATIVE Final    Comment: (NOTE) The Xpert Xpress SARS-CoV-2/FLU/RSV plus assay is intended as an aid in the diagnosis of influenza from Nasopharyngeal swab specimens and should not be used as a sole basis for treatment. Nasal washings and aspirates are unacceptable for Xpert Xpress SARS-CoV-2/FLU/RSV testing.  Fact Sheet for Patients: EntrepreneurPulse.com.au  Fact Sheet for Healthcare Providers: IncredibleEmployment.be  This test is not yet approved or cleared by the [place] FDA and has been authorized for detection and/or diagnosis of SARS-CoV-2 by FDA under an Emergency Use Authorization (EUA). This EUA will remain in effect (meaning this test can be used) for the duration of the COVID-19 declaration under Section 564(b)(1) of the Act, 21 U.S.C. section 360bbb-3(b)(1), unless the authorization is terminated or revoked.  Performed at [facility], [address] [place], [place] [address]   Resp Panel by RT-PCR (Flu A&B, Covid) Nasopharyngeal Swab     Status: None   Collection Time: 07/22/21  3:30 PM   Specimen: Nasopharyngeal Swab; Nasopharyngeal(NP) swabs in vial transport medium  Result  Value Ref Range Status   SARS Coronavirus 2 by RT PCR NEGATIVE NEGATIVE Final    Comment: (NOTE) SARS-CoV-2 target nucleic acids are NOT DETECTED.  The SARS-CoV-2 RNA is generally detectable in  upper respiratory specimens during the acute phase of infection. The lowest concentration of SARS-CoV-2 viral copies this assay can detect is 138 copies/mL. A negative result does not preclude SARS-Cov-2 infection and should not be used as the sole basis for treatment or other patient management decisions. A negative result may occur with  improper specimen collection/handling, submission of specimen other than nasopharyngeal swab, presence of viral mutation(s) within the areas targeted by this assay, and inadequate number of viral copies(<138 copies/mL). A negative result must be combined with clinical observations, patient history, and epidemiological information. The expected result is Negative.  Fact Sheet for Patients:  EntrepreneurPulse.com.au  Fact Sheet for Healthcare Providers:  IncredibleEmployment.be  This test is no t yet approved or cleared by the [place] FDA and  has been authorized for detection and/or diagnosis of SARS-CoV-2 by FDA under an Emergency Use Authorization (EUA). This EUA will remain  in effect (meaning this test can be used) for the duration of the COVID-19 declaration under Section 564(b)(1) of the Act, 21 U.S.C.section 360bbb-3(b)(1), unless the authorization is terminated  or revoked sooner.       Influenza A by PCR NEGATIVE NEGATIVE Final   Influenza B by PCR NEGATIVE NEGATIVE Final    Comment: (NOTE) The Xpert Xpress SARS-CoV-2/FLU/RSV plus assay is intended as an aid in the diagnosis of influenza from Nasopharyngeal swab specimens and should not be used as a sole basis for treatment. Nasal washings and aspirates are unacceptable for Xpert Xpress SARS-CoV-2/FLU/RSV testing.  Fact Sheet for  Patients: EntrepreneurPulse.com.au  Fact Sheet for Healthcare Providers: IncredibleEmployment.be  This test is not yet approved or cleared by the [place] FDA and has been authorized for detection and/or diagnosis of SARS-CoV-2 by FDA under an Emergency Use Authorization (EUA). This EUA will remain in effect (meaning this test can be used) for the duration of the COVID-19 declaration under Section 564(b)(1) of the Act, 21 U.S.C. section 360bbb-3(b)(1), unless the authorization is terminated or revoked.  Performed at [facility], [address], [place], [place] [address]      Time coordinating discharge: Over 30 minutes  SIGNED:   Sidney Ace, MD  [facility] 07/23/2021, 8:05 AM Pager   If 7PM-7AM, please contact night-coverage

## 2021-07-23 NOTE — TOC Progression Note (Signed)
Transition of Care [facility]) - Progression Note    Patient Details  Name: [name] [name] MRN: [id number] Date of Birth: March 03, 1930  [facility] CM/SW Contact  Shelbie Hutching, RN Phone Number: 07/23/2021, 12:15 PM  Clinical Narrative:    Patient going to room 24B at [facility] center.  RNCM has arranged EMS transport and she is first up on the list for pick up.   Expected Discharge Plan: [place] Barriers to Discharge: Barriers Resolved  Expected Discharge Plan and Services Expected Discharge Plan: [place]   Discharge Planning Services: CM Consult Post Acute Care Choice: [place] Living arrangements for the past 2 months: Single Family Home Expected Discharge Date: 07/23/21               DME Arranged: N/A DME Agency: NA       HH Arranged: NA HH Agency: NA         Social Determinants of Health (SDOH) Interventions    Readmission Risk Interventions No flowsheet data found.

## 2021-07-23 NOTE — Care Management Important Message (Signed)
Important Message  Patient Details  Name: [name] [name] MRN: [id number] Date of Birth: 10-Nov-1930   Medicare Important Message Given:  Yes  I spoke with the RN CM and she said to call the patients granddaughter to review the form. I talked with granddaughter, Alric Quan [phone]) by phone and reviewed the Important Message from Medicare with her. She is in agreement with today's discharge plan and I asked if she would like me to send her a copy of the form but she declined.  I wished her grandmother well wishes and thanked her for her time.  She asked that her last name be changed in the system to [place] [facility] and I updated her contact information as directed.  [name] [name] 07/23/2021, 11:24 AM

## 2021-07-23 NOTE — TOC Transition Note (Signed)
Transition of Care [facility]) - CM/SW Discharge Note   Patient Details  Name: [name] [name] MRN: [id number] Date of Birth: 1930/07/02  [facility]) CM/SW Contact:  Shelbie Hutching, RN Phone Number: 07/23/2021, 10:25 AM   Clinical Narrative:    Patient is medically cleared for discharge to [facility] today.  Insurance authorization has been approved [place] ID [id number] Reference [id number] Approved 9/12- 9/14 RNCM is waiting on facility to give room assignment before setting up transport.  Patient will transport via [employer].  Granddaughter [name] aware of discharge today.     Final next level of care: Skilled Nursing Facility Barriers to Discharge: Barriers Resolved   Patient Goals and CMS Choice Patient states their goals for this hospitalization and ongoing recovery are:: Granddaughter can not care for the patient at home by herself, she is thinking LTC will be best CMS Medicare.gov Compare Post Acute Care list provided to:: Patient Represenative (must comment) Choice offered to / list presented to : Adult Children  Discharge Placement   Existing PASRR number confirmed : 07/19/21          Patient chooses bed at: [facility] Patient to be transferred to facility by: [facility] Name of family member notified: [name] Patient and family notified of of transfer: 07/23/21  Discharge Plan and Services   Discharge Planning Services: CM Consult Post Acute Care Choice: [place]          DME Arranged: N/A DME Agency: NA       HH Arranged: NA HH Agency: NA        Social Determinants of Health (SDOH) Interventions     Readmission Risk Interventions No flowsheet data found.

## 2021-07-23 NOTE — Progress Notes (Signed)
Attempted to call report x 1. No answer after transferred.

## 2021-07-23 NOTE — Progress Notes (Signed)
Attempted to call report. No answer.

## 2021-08-18 ENCOUNTER — Emergency Department: Payer: Medicare Other

## 2021-08-18 ENCOUNTER — Other Ambulatory Visit: Payer: Self-pay

## 2021-08-18 ENCOUNTER — Encounter: Payer: Self-pay | Admitting: Emergency Medicine

## 2021-08-18 ENCOUNTER — Emergency Department
Admission: EM | Admit: 2021-08-18 | Discharge: 2021-08-18 | Disposition: A | Discharge status: Home / Self Care | Payer: Medicare Other | Attending: Emergency Medicine | Admitting: Emergency Medicine

## 2021-08-18 DIAGNOSIS — I130 Hypertensive heart and chronic kidney disease with heart failure and stage 1 through stage 4 chronic kidney disease, or unspecified chronic kidney disease: Secondary | ICD-10-CM | POA: Insufficient documentation

## 2021-08-18 DIAGNOSIS — S01112A Laceration without foreign body of left eyelid and periocular area, initial encounter: Secondary | ICD-10-CM | POA: Insufficient documentation

## 2021-08-18 DIAGNOSIS — S0181XA Laceration without foreign body of other part of head, initial encounter: Secondary | ICD-10-CM

## 2021-08-18 DIAGNOSIS — I5030 Unspecified diastolic (congestive) heart failure: Secondary | ICD-10-CM | POA: Insufficient documentation

## 2021-08-18 DIAGNOSIS — S0990XA Unspecified injury of head, initial encounter: Secondary | ICD-10-CM

## 2021-08-18 DIAGNOSIS — F0390 Unspecified dementia without behavioral disturbance: Secondary | ICD-10-CM | POA: Insufficient documentation

## 2021-08-18 DIAGNOSIS — E1122 Type 2 diabetes mellitus with diabetic chronic kidney disease: Secondary | ICD-10-CM | POA: Diagnosis not present

## 2021-08-18 DIAGNOSIS — W19XXXA Unspecified fall, initial encounter: Secondary | ICD-10-CM | POA: Diagnosis not present

## 2021-08-18 DIAGNOSIS — N184 Chronic kidney disease, stage 4 (severe): Secondary | ICD-10-CM | POA: Insufficient documentation

## 2021-08-18 MED ORDER — LIDOCAINE HCL (PF) 1 % IJ SOLN
5.0000 mL | Freq: Once | INTRAMUSCULAR | Status: AC
  Administered 2021-08-18: 5 mL via INTRADERMAL
  Filled 2021-08-18: qty 5

## 2021-08-18 MED ORDER — LIDOCAINE-EPINEPHRINE-TETRACAINE (LET) TOPICAL GEL
3.0000 mL | Freq: Once | TOPICAL | Status: AC
  Administered 2021-08-18: 3 mL via TOPICAL
  Filled 2021-08-18: qty 3
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
## 2021-08-18 NOTE — ED Triage Notes (Signed)
Presents via EMS form [facility]  s/p fall  per states she lost her balance and fell face first  laceration noted over left eye

## 2021-08-18 NOTE — Discharge Instructions (Addendum)
Patient is to return to the emergency room or primary care doctor within 7 to 10 days for suture removal. Keep area clean and dry.

## 2021-08-30 ENCOUNTER — Encounter: Payer: Self-pay | Admitting: Emergency Medicine

## 2021-08-30 ENCOUNTER — Emergency Department: Payer: Medicare Other

## 2021-08-30 DIAGNOSIS — I5030 Unspecified diastolic (congestive) heart failure: Secondary | ICD-10-CM | POA: Insufficient documentation

## 2021-08-30 DIAGNOSIS — N184 Chronic kidney disease, stage 4 (severe): Secondary | ICD-10-CM | POA: Diagnosis not present

## 2021-08-30 DIAGNOSIS — R600 Localized edema: Secondary | ICD-10-CM | POA: Diagnosis not present

## 2021-08-30 DIAGNOSIS — E119 Type 2 diabetes mellitus without complications: Secondary | ICD-10-CM | POA: Insufficient documentation

## 2021-08-30 DIAGNOSIS — M7989 Other specified soft tissue disorders: Secondary | ICD-10-CM | POA: Insufficient documentation

## 2021-08-30 DIAGNOSIS — R52 Pain, unspecified: Secondary | ICD-10-CM

## 2021-08-30 DIAGNOSIS — F0390 Unspecified dementia without behavioral disturbance: Secondary | ICD-10-CM | POA: Diagnosis not present

## 2021-08-30 DIAGNOSIS — R410 Disorientation, unspecified: Secondary | ICD-10-CM | POA: Diagnosis not present

## 2021-08-30 DIAGNOSIS — I130 Hypertensive heart and chronic kidney disease with heart failure and stage 1 through stage 4 chronic kidney disease, or unspecified chronic kidney disease: Secondary | ICD-10-CM | POA: Diagnosis not present

## 2021-08-30 DIAGNOSIS — R296 Repeated falls: Secondary | ICD-10-CM | POA: Diagnosis not present

## 2021-08-30 LAB — CBC WITH DIFFERENTIAL/PLATELET
Abs Immature Granulocytes: 0.01 10*3/uL (ref 0.00–0.07)
Basophils Absolute: 0 10*3/uL (ref 0.0–0.1)
Basophils Relative: 1 %
Eosinophils Absolute: 0.2 10*3/uL (ref 0.0–0.5)
Eosinophils Relative: 3 %
HCT: 30.9 % — ABNORMAL LOW (ref 36.0–46.0)
Hemoglobin: 9.5 g/dL — ABNORMAL LOW (ref 12.0–15.0)
Immature Granulocytes: 0 %
Lymphocytes Relative: 24 %
Lymphs Abs: 1.5 10*3/uL (ref 0.7–4.0)
MCH: 31.1 pg (ref 26.0–34.0)
MCHC: 30.7 g/dL (ref 30.0–36.0)
MCV: 101.3 fL — ABNORMAL HIGH (ref 80.0–100.0)
Monocytes Absolute: 0.9 10*3/uL (ref 0.1–1.0)
Monocytes Relative: 14 %
Neutro Abs: 3.5 10*3/uL (ref 1.7–7.7)
Neutrophils Relative %: 58 %
Platelets: 150 10*3/uL (ref 150–400)
RBC: 3.05 MIL/uL — ABNORMAL LOW (ref 3.87–5.11)
RDW: 15.1 % (ref 11.5–15.5)
Smear Review: NORMAL
WBC: 6.2 10*3/uL (ref 4.0–10.5)
nRBC: 0 % (ref 0.0–0.2)

## 2021-08-30 LAB — BASIC METABOLIC PANEL
Anion gap: 8 (ref 5–15)
BUN: 57 mg/dL — ABNORMAL HIGH (ref 8–23)
CO2: 27 mmol/L (ref 22–32)
Calcium: 9.4 mg/dL (ref 8.9–10.3)
Chloride: 108 mmol/L (ref 98–111)
Creatinine, Ser: 2.12 mg/dL — ABNORMAL HIGH (ref 0.44–1.00)
GFR, Estimated: 22 mL/min — ABNORMAL LOW (ref >60)
Glucose, Bld: 108 mg/dL — ABNORMAL HIGH (ref 70–99)
Potassium: 5.7 mmol/L — ABNORMAL HIGH (ref 3.5–5.1)
Sodium: 143 mmol/L (ref 135–145)

## 2021-08-30 LAB — BRAIN NATRIURETIC PEPTIDE: B Natriuretic Peptide: 65.9 pg/mL (ref 0.0–100.0)

## 2021-08-30 NOTE — ED Triage Notes (Signed)
Pt brought from [place] via EMS for lower extremity edema times 1 week. Pt complains of right shoulder and arm pain. Pain in her lower feet and moderate edema noted.

## 2021-08-30 NOTE — ED Triage Notes (Signed)
Pt arrival by EMS from [facility] center for swelling of the bilateral lower extremities times 1 week.

## 2021-08-31 ENCOUNTER — Emergency Department
Admission: EM | Admit: 2021-08-31 | Discharge: 2021-08-31 | Disposition: A | Discharge status: Home / Self Care | Payer: Medicare Other | Attending: Emergency Medicine | Admitting: Emergency Medicine

## 2021-08-31 ENCOUNTER — Emergency Department: Payer: Medicare Other

## 2021-08-31 DIAGNOSIS — R600 Localized edema: Secondary | ICD-10-CM | POA: Diagnosis not present

## 2021-08-31 DIAGNOSIS — R52 Pain, unspecified: Secondary | ICD-10-CM

## 2021-08-31 DIAGNOSIS — R609 Edema, unspecified: Secondary | ICD-10-CM

## 2021-08-31 LAB — CBC WITH DIFFERENTIAL/PLATELET
Abs Immature Granulocytes: 0.02 10*3/uL (ref 0.00–0.07)
Basophils Absolute: 0 10*3/uL (ref 0.0–0.1)
Basophils Relative: 1 %
Eosinophils Absolute: 0.2 10*3/uL (ref 0.0–0.5)
Eosinophils Relative: 3 %
HCT: 29.4 % — ABNORMAL LOW (ref 36.0–46.0)
Hemoglobin: 9.2 g/dL — ABNORMAL LOW (ref 12.0–15.0)
Immature Granulocytes: 0 %
Lymphocytes Relative: 27 %
Lymphs Abs: 1.7 10*3/uL (ref 0.7–4.0)
MCH: 31.5 pg (ref 26.0–34.0)
MCHC: 31.3 g/dL (ref 30.0–36.0)
MCV: 100.7 fL — ABNORMAL HIGH (ref 80.0–100.0)
Monocytes Absolute: 1.1 10*3/uL — ABNORMAL HIGH (ref 0.1–1.0)
Monocytes Relative: 18 %
Neutro Abs: 3.1 10*3/uL (ref 1.7–7.7)
Neutrophils Relative %: 51 %
Platelets: 129 10*3/uL — ABNORMAL LOW (ref 150–400)
RBC: 2.92 MIL/uL — ABNORMAL LOW (ref 3.87–5.11)
RDW: 15.1 % (ref 11.5–15.5)
WBC: 6.1 10*3/uL (ref 4.0–10.5)
nRBC: 0 % (ref 0.0–0.2)

## 2021-08-31 NOTE — ED Notes (Signed)
This RN attempted to call [facility] x2 with no answer and no way to leave a message.

## 2021-08-31 NOTE — ED Notes (Signed)
Report to [place] at [facility].

## 2021-08-31 NOTE — ED Notes (Signed)
Pt resting comfortably at this time. Normal rise and fall of chest. NAD noted. Call bell in reach.  Transport back to facility to be arranged.
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
## 2021-08-31 NOTE — Discharge Instructions (Addendum)
Please have her hemoglobin rechecked this week.  It was noted to be a little bit low at 9.5 today, similar to the blood work she had done a couple days ago.  No signs of blood clot to either leg.  Return to the ED with any further worsening symptoms

## 2021-09-10 ENCOUNTER — Emergency Department: Payer: Medicare Other

## 2021-09-10 ENCOUNTER — Encounter: Payer: Self-pay | Admitting: Internal Medicine

## 2021-09-10 ENCOUNTER — Other Ambulatory Visit: Payer: Self-pay

## 2021-09-10 ENCOUNTER — Inpatient Hospital Stay: Payer: Medicare Other

## 2021-09-10 ENCOUNTER — Inpatient Hospital Stay
Admission: EM | Admit: 2021-09-10 | Discharge: 2021-09-20 | DRG: 871 | Disposition: A | Discharge status: Skilled Nursing Facility | Payer: Medicare Other | Source: Skilled Nursing Facility | Attending: Internal Medicine | Admitting: Internal Medicine

## 2021-09-10 DIAGNOSIS — U071 COVID-19: Secondary | ICD-10-CM | POA: Diagnosis present

## 2021-09-10 DIAGNOSIS — E875 Hyperkalemia: Secondary | ICD-10-CM | POA: Diagnosis present

## 2021-09-10 DIAGNOSIS — E785 Hyperlipidemia, unspecified: Secondary | ICD-10-CM | POA: Diagnosis present

## 2021-09-10 DIAGNOSIS — E11649 Type 2 diabetes mellitus with hypoglycemia without coma: Secondary | ICD-10-CM | POA: Diagnosis present

## 2021-09-10 DIAGNOSIS — A419 Sepsis, unspecified organism: Principal | ICD-10-CM | POA: Diagnosis present

## 2021-09-10 DIAGNOSIS — I10 Essential (primary) hypertension: Secondary | ICD-10-CM | POA: Diagnosis present

## 2021-09-10 DIAGNOSIS — D631 Anemia in chronic kidney disease: Secondary | ICD-10-CM | POA: Diagnosis present

## 2021-09-10 DIAGNOSIS — I1310 Hypertensive heart and chronic kidney disease without heart failure, with stage 1 through stage 4 chronic kidney disease, or unspecified chronic kidney disease: Secondary | ICD-10-CM | POA: Diagnosis present

## 2021-09-10 DIAGNOSIS — D61818 Other pancytopenia: Secondary | ICD-10-CM | POA: Diagnosis present

## 2021-09-10 DIAGNOSIS — L89322 Pressure ulcer of left buttock, stage 2: Secondary | ICD-10-CM | POA: Diagnosis present

## 2021-09-10 DIAGNOSIS — R4182 Altered mental status, unspecified: Secondary | ICD-10-CM | POA: Insufficient documentation

## 2021-09-10 DIAGNOSIS — Z79899 Other long term (current) drug therapy: Secondary | ICD-10-CM | POA: Diagnosis not present

## 2021-09-10 DIAGNOSIS — I959 Hypotension, unspecified: Secondary | ICD-10-CM | POA: Diagnosis present

## 2021-09-10 DIAGNOSIS — Z66 Do not resuscitate: Secondary | ICD-10-CM | POA: Diagnosis not present

## 2021-09-10 DIAGNOSIS — F03C18 Unspecified dementia, severe, with other behavioral disturbance: Secondary | ICD-10-CM | POA: Diagnosis present

## 2021-09-10 DIAGNOSIS — F05 Delirium due to known physiological condition: Secondary | ICD-10-CM | POA: Diagnosis present

## 2021-09-10 DIAGNOSIS — L89312 Pressure ulcer of right buttock, stage 2: Secondary | ICD-10-CM | POA: Diagnosis present

## 2021-09-10 DIAGNOSIS — N390 Urinary tract infection, site not specified: Secondary | ICD-10-CM | POA: Diagnosis present

## 2021-09-10 DIAGNOSIS — Z993 Dependence on wheelchair: Secondary | ICD-10-CM

## 2021-09-10 DIAGNOSIS — F03918 Unspecified dementia, unspecified severity, with other behavioral disturbance: Secondary | ICD-10-CM | POA: Diagnosis present

## 2021-09-10 DIAGNOSIS — F32A Depression, unspecified: Secondary | ICD-10-CM | POA: Diagnosis present

## 2021-09-10 DIAGNOSIS — F0394 Unspecified dementia, unspecified severity, with anxiety: Secondary | ICD-10-CM | POA: Diagnosis present

## 2021-09-10 DIAGNOSIS — E1122 Type 2 diabetes mellitus with diabetic chronic kidney disease: Secondary | ICD-10-CM | POA: Diagnosis present

## 2021-09-10 DIAGNOSIS — N184 Chronic kidney disease, stage 4 (severe): Secondary | ICD-10-CM | POA: Diagnosis present

## 2021-09-10 DIAGNOSIS — G9341 Metabolic encephalopathy: Secondary | ICD-10-CM | POA: Diagnosis present

## 2021-09-10 DIAGNOSIS — T68XXXA Hypothermia, initial encounter: Secondary | ICD-10-CM

## 2021-09-10 DIAGNOSIS — L8990 Pressure ulcer of unspecified site, unspecified stage: Secondary | ICD-10-CM | POA: Insufficient documentation

## 2021-09-10 DIAGNOSIS — R001 Bradycardia, unspecified: Secondary | ICD-10-CM | POA: Diagnosis present

## 2021-09-10 LAB — COMPREHENSIVE METABOLIC PANEL
ALT: 29 U/L (ref 0–44)
AST: 30 U/L (ref 15–41)
Albumin: 3.2 g/dL — ABNORMAL LOW (ref 3.5–5.0)
Alkaline Phosphatase: 79 U/L (ref 38–126)
Anion gap: 6 (ref 5–15)
BUN: 58 mg/dL — ABNORMAL HIGH (ref 8–23)
CO2: 28 mmol/L (ref 22–32)
Calcium: 9.1 mg/dL (ref 8.9–10.3)
Chloride: 110 mmol/L (ref 98–111)
Creatinine, Ser: 2.4 mg/dL — ABNORMAL HIGH (ref 0.44–1.00)
GFR, Estimated: 19 mL/min — ABNORMAL LOW (ref >60)
Glucose, Bld: 83 mg/dL (ref 70–99)
Potassium: 5.7 mmol/L — ABNORMAL HIGH (ref 3.5–5.1)
Sodium: 144 mmol/L (ref 135–145)
Total Bilirubin: 0.7 mg/dL (ref 0.3–1.2)
Total Protein: 7.3 g/dL (ref 6.5–8.1)

## 2021-09-10 LAB — CBC
HCT: 28.8 % — ABNORMAL LOW (ref 36.0–46.0)
Hemoglobin: 9 g/dL — ABNORMAL LOW (ref 12.0–15.0)
MCH: 31.6 pg (ref 26.0–34.0)
MCHC: 31.3 g/dL (ref 30.0–36.0)
MCV: 101.1 fL — ABNORMAL HIGH (ref 80.0–100.0)
Platelets: 140 10*3/uL — ABNORMAL LOW (ref 150–400)
RBC: 2.85 MIL/uL — ABNORMAL LOW (ref 3.87–5.11)
RDW: 15.6 % — ABNORMAL HIGH (ref 11.5–15.5)
WBC: 5.1 10*3/uL (ref 4.0–10.5)
nRBC: 0 % (ref 0.0–0.2)

## 2021-09-10 LAB — BASIC METABOLIC PANEL
Anion gap: 6 (ref 5–15)
BUN: 54 mg/dL — ABNORMAL HIGH (ref 8–23)
CO2: 25 mmol/L (ref 22–32)
Calcium: 9 mg/dL (ref 8.9–10.3)
Chloride: 113 mmol/L — ABNORMAL HIGH (ref 98–111)
Creatinine, Ser: 2.37 mg/dL — ABNORMAL HIGH (ref 0.44–1.00)
GFR, Estimated: 19 mL/min — ABNORMAL LOW (ref >60)
Glucose, Bld: 58 mg/dL — ABNORMAL LOW (ref 70–99)
Potassium: 5.1 mmol/L (ref 3.5–5.1)
Sodium: 144 mmol/L (ref 135–145)

## 2021-09-10 LAB — LACTIC ACID, PLASMA
Lactic Acid, Venous: 1 mmol/L (ref 0.5–1.9)
Lactic Acid, Venous: 1.3 mmol/L (ref 0.5–1.9)

## 2021-09-10 LAB — RESP PANEL BY RT-PCR (FLU A&B, COVID) ARPGX2
Influenza A by PCR: NEGATIVE
Influenza B by PCR: NEGATIVE
SARS Coronavirus 2 by RT PCR: POSITIVE — AB

## 2021-09-10 LAB — APTT: aPTT: 36 seconds (ref 24–36)

## 2021-09-10 LAB — CBG MONITORING, ED
Glucose-Capillary: 48 mg/dL — ABNORMAL LOW (ref 70–99)
Glucose-Capillary: 52 mg/dL — ABNORMAL LOW (ref 70–99)
Glucose-Capillary: 93 mg/dL (ref 70–99)
Glucose-Capillary: 105 mg/dL — ABNORMAL HIGH (ref 70–99)

## 2021-09-10 LAB — URINALYSIS, COMPLETE (UACMP) WITH MICROSCOPIC
Bacteria, UA: NONE SEEN
Bilirubin Urine: NEGATIVE
Glucose, UA: NEGATIVE mg/dL
Hgb urine dipstick: NEGATIVE
Ketones, ur: NEGATIVE mg/dL
Nitrite: POSITIVE — AB
Protein, ur: NEGATIVE mg/dL
Specific Gravity, Urine: 1.012 (ref 1.005–1.030)
pH: 5 (ref 5.0–8.0)

## 2021-09-10 LAB — PROTIME-INR
INR: 1.1 (ref 0.8–1.2)
Prothrombin Time: 14.5 seconds (ref 11.4–15.2)

## 2021-09-10 LAB — TSH: TSH: 5.296 u[IU]/mL — ABNORMAL HIGH (ref 0.350–4.500)

## 2021-09-10 MED ORDER — GUAIFENESIN-DM 100-10 MG/5ML PO SYRP
10.0000 mL | ORAL_SOLUTION | ORAL | Status: DC | PRN
  Administered 2021-09-18 – 2021-09-19 (×2): 10 mL via ORAL
  Filled 2021-09-10 (×2): qty 10

## 2021-09-10 MED ORDER — ZIPRASIDONE MESYLATE 20 MG IM SOLR
20.0000 mg | Freq: Once | INTRAMUSCULAR | Status: AC
  Administered 2021-09-11: 20 mg via INTRAMUSCULAR
  Filled 2021-09-10: qty 20

## 2021-09-10 MED ORDER — LACTATED RINGERS IV SOLN: Freq: Once | INTRAVENOUS | Status: AC

## 2021-09-10 MED ORDER — LORAZEPAM 0.5 MG PO TABS
0.5000 mg | ORAL_TABLET | Freq: Once | ORAL | Status: DC | PRN
  Filled 2021-09-10: qty 1

## 2021-09-10 MED ORDER — ACETAMINOPHEN 650 MG RE SUPP
650.0000 mg | Freq: Four times a day (QID) | RECTAL | Status: DC | PRN
  Filled 2021-09-10: qty 1

## 2021-09-10 MED ORDER — ACETAMINOPHEN 325 MG PO TABS
650.0000 mg | ORAL_TABLET | Freq: Four times a day (QID) | ORAL | Status: DC | PRN
  Administered 2021-09-13 – 2021-09-19 (×4): 650 mg via ORAL
  Filled 2021-09-10 (×4): qty 2

## 2021-09-10 MED ORDER — SODIUM ZIRCONIUM CYCLOSILICATE 10 G PO PACK
10.0000 g | PACK | Freq: Every day | ORAL | Status: DC
  Filled 2021-09-10 (×4): qty 1

## 2021-09-10 MED ORDER — DEXTROSE-NACL 5-0.45 % IV SOLN: INTRAVENOUS | Status: DC

## 2021-09-10 MED ORDER — SODIUM CHLORIDE 0.9 % IV BOLUS (SEPSIS)
1000.0000 mL | Freq: Once | INTRAVENOUS | Status: AC
  Administered 2021-09-10: 1000 mL via INTRAVENOUS

## 2021-09-10 MED ORDER — ZINC SULFATE 220 (50 ZN) MG PO CAPS
220.0000 mg | ORAL_CAPSULE | Freq: Every day | ORAL | Status: DC
  Administered 2021-09-13 – 2021-09-20 (×8): 220 mg via ORAL
  Filled 2021-09-10 (×9): qty 1

## 2021-09-10 MED ORDER — LORAZEPAM 2 MG/ML IJ SOLN
1.0000 mg | INTRAMUSCULAR | Status: DC | PRN
  Administered 2021-09-10 – 2021-09-12 (×3): 1 mg via INTRAVENOUS
  Filled 2021-09-10 (×3): qty 1

## 2021-09-10 MED ORDER — VANCOMYCIN HCL IN DEXTROSE 1-5 GM/200ML-% IV SOLN: 1000.0000 mg | Freq: Once | INTRAVENOUS | Status: DC

## 2021-09-10 MED ORDER — SODIUM CHLORIDE 0.9 % IV SOLN
1.0000 g | INTRAVENOUS | Status: AC
  Administered 2021-09-11 – 2021-09-15 (×5): 1 g via INTRAVENOUS
  Filled 2021-09-10 (×7): qty 10

## 2021-09-10 MED ORDER — SODIUM CHLORIDE 0.9 % IV SOLN
200.0000 mg | Freq: Once | INTRAVENOUS | Status: AC
  Administered 2021-09-10: 200 mg via INTRAVENOUS
  Filled 2021-09-10: qty 200

## 2021-09-10 MED ORDER — DEXTROSE 10 % IV SOLN: Freq: Once | INTRAVENOUS | Status: DC

## 2021-09-10 MED ORDER — LORAZEPAM 2 MG/ML IJ SOLN
0.5000 mg | INTRAMUSCULAR | Status: DC | PRN
  Administered 2021-09-10: 0.5 mg via INTRAVENOUS
  Filled 2021-09-10: qty 1

## 2021-09-10 MED ORDER — METRONIDAZOLE 500 MG/100ML IV SOLN
500.0000 mg | Freq: Once | INTRAVENOUS | Status: AC
  Administered 2021-09-10: 500 mg via INTRAVENOUS
  Filled 2021-09-10: qty 100

## 2021-09-10 MED ORDER — LACTATED RINGERS IV BOLUS
1000.0000 mL | Freq: Once | INTRAVENOUS | Status: AC
  Administered 2021-09-10: 1000 mL via INTRAVENOUS

## 2021-09-10 MED ORDER — ALBUTEROL SULFATE HFA 108 (90 BASE) MCG/ACT IN AERS
2.0000 | INHALATION_SPRAY | Freq: Four times a day (QID) | RESPIRATORY_TRACT | Status: DC
  Administered 2021-09-12 – 2021-09-19 (×23): 2 via RESPIRATORY_TRACT
  Filled 2021-09-10 (×3): qty 6.7

## 2021-09-10 MED ORDER — ASCORBIC ACID 500 MG PO TABS
500.0000 mg | ORAL_TABLET | Freq: Every day | ORAL | Status: DC
  Administered 2021-09-13 – 2021-09-20 (×8): 500 mg via ORAL
  Filled 2021-09-10 (×9): qty 1

## 2021-09-10 MED ORDER — SODIUM CHLORIDE 0.9 % IV SOLN
100.0000 mg | Freq: Every day | INTRAVENOUS | Status: AC
  Administered 2021-09-11 – 2021-09-14 (×4): 100 mg via INTRAVENOUS
  Filled 2021-09-10 (×3): qty 20
  Filled 2021-09-10: qty 100
  Filled 2021-09-10 (×3): qty 20

## 2021-09-10 MED ORDER — SODIUM CHLORIDE 0.9 % IV SOLN
2.0000 g | Freq: Once | INTRAVENOUS | Status: AC
  Administered 2021-09-10: 2 g via INTRAVENOUS
  Filled 2021-09-10: qty 2

## 2021-09-10 MED ORDER — DEXTROSE-NACL 10-0.45 % IV SOLN
INTRAVENOUS | Status: DC
Start: 2021-09-10 — End: 2021-09-17
  Filled 2021-09-10 (×22): qty 1000

## 2021-09-10 MED ORDER — VANCOMYCIN HCL 1750 MG/350ML IV SOLN
1750.0000 mg | Freq: Once | INTRAVENOUS | Status: AC
  Administered 2021-09-10: 1750 mg via INTRAVENOUS
  Filled 2021-09-10: qty 350

## 2021-09-10 MED ORDER — DEXTROSE 50 % IV SOLN
INTRAVENOUS | Status: AC
  Administered 2021-09-10: 50 mL
  Filled 2021-09-10: qty 50

## 2021-09-10 MED ORDER — ENOXAPARIN SODIUM 30 MG/0.3ML IJ SOSY
30.0000 mg | PREFILLED_SYRINGE | INTRAMUSCULAR | Status: DC
  Administered 2021-09-12 (×2): 30 mg via SUBCUTANEOUS
  Filled 2021-09-10 (×2): qty 0.3

## 2021-09-10 MED ORDER — INSULIN ASPART 100 UNIT/ML IV SOLN
5.0000 [IU] | Freq: Once | INTRAVENOUS | Status: DC
  Filled 2021-09-10: qty 0.05

## 2021-09-10 MED ORDER — LACTATED RINGERS IV SOLN: INTRAVENOUS | Status: DC

## 2021-09-10 NOTE — Consult Note (Signed)
Remdesivir - Pharmacy Brief Note   O:  ALT: 29 CXR: Cardiomegaly. Central pulmonary vessels are more prominent suggesting CHF. There is diffuse increase in interstitial markings in both lungs, more so on the right side suggesting asymmetric pulmonary edema. Possibility of underlying pneumonia is not excluded. Small left pleural effusion. SpO2: 100% on RA   A/P:  Remdesivir 200 mg IVPB once followed by 100 mg IVPB daily x 4 days.   Pearla Dubonnet, PharmD Clinical Pharmacist 09/10/2021 2:22 PM
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
## 2021-09-10 NOTE — H&P (Addendum)
History and Physical    Katana Berthold PFX:[id number] DOB: [birth year] DOA: 09/10/2021  PCP: Beckie Salts, MD   Patient coming from: [facility] have personally briefly reviewed patient's old medical records in [place]  Chief Complaint: Change in mental status Most of the history is obtained from ER note as patient is unable to provide any history.   HPI: [name] [name] is a 87 y.o. female with medical history significant for dementia, diabetes mellitus with complications of stage IV chronic kidney disease, hypertension and dyslipidemia who presents to the emergency room from the skilled nursing facility where she resides for evaluation of change in mental status and increased somnolence. She was seen in the emergency room over a week ago for evaluation of bilateral lower extremity swelling and was discharged back to the nursing home in stable condition. She is lethargic and arouses to loud verbal stimuli but does not stay awake long enough to provide any history.   She was noted to be bradycardic and hypothermic upon arrival to the emergency room and is currently on a Retail [employer]. I am unable to do review of systems on this patient due to her mental status. Labs show sodium 144, potassium 5.7, chloride 110, bicarb 28, glucose 83, BUN 58, creatinine 2.40, calcium 9.1, alkaline phosphatase 79, albumin 3.2, AST 30, ALT 29, total protein 7.3, lactic acid 1.0, white count 5.1, hemoglobin 9.0, hematocrit 28.8, MCV 101.1, RDW 15.6, platelet count 140, PT 14.5, INR 1.1 Respiratory viral panel is positive for SARS coronavirus 2 Chest x-ray reviewed by me shows cardiomegaly.  Central pulmonary vessels are more prominent suggesting CHF.  There is diffuse increase in interstitial markings in both lungs, more so on the right side suggesting asymmetric pulmonary edema.  Possibility of underlying pneumonia is not excluded.    ED Course: Patient is an 87 year old female who was sent to the  emergency room from the skilled nursing facility where she resides for evaluation of change in mental status.  She is lethargic but arouses to verbal stimuli but is unable to stay awake long enough to provide any history. Noted to be bradycardic and hypothermic upon arrival to the ER. Labs show hyperkalemia She will be admitted to the hospital for further evaluation.   Review of Systems: As per HPI otherwise all other systems reviewed and negative.    Past Medical History:  Diagnosis Date   Diabetes mellitus    Hyperlipidemia    Hypertension     Past Surgical History:  Procedure Laterality Date   APPENDECTOMY     CHOLECYSTECTOMY     COLONOSCOPY  06/23/2012   Procedure: COLONOSCOPY;  Surgeon: Lear Ng, MD;  Location: [place];  Service: Endoscopy;  Laterality: N/A;   ESOPHAGOGASTRODUODENOSCOPY  06/23/2012   Procedure: ESOPHAGOGASTRODUODENOSCOPY (EGD);  Surgeon: Lear Ng, MD;  Location: [facility] ENDOSCOPY;  Service: Endoscopy;  Laterality: N/A;     reports that she has never smoked. She has never used smokeless tobacco. She reports that she does not drink alcohol and does not use drugs.  Allergies  Allergen Reactions   Alendronate     Other reaction(s): Other (See Comments) Poor renal function.    History reviewed. No pertinent family history.  Patient unable to provide history   Prior to Admission medications   Medication Sig Start Date End Date Taking? Authorizing Provider  atorvastatin (LIPITOR) 20 MG tablet Take 20 mg by mouth daily. 06/13/21  Yes [provider]  citalopram (CELEXA) 20 MG tablet  Take 1 tablet (20 mg total) by mouth daily. 02/06/21  Yes Clapacs, Madie Reno, MD  OLANZapine (ZYPREXA) 2.5 MG tablet Take 1 tablet (2.5 mg total) by mouth at bedtime. 02/05/21  Yes Clapacs, Madie Reno, MD  pantoprazole (PROTONIX) 40 MG tablet Take 40 mg by mouth daily.   Yes [provider]  traZODone (DESYREL) 100 MG tablet Take 100 mg by mouth at  bedtime. 07/16/21  Yes [provider]  acetaminophen (TYLENOL) 325 MG tablet Take 650 mg by mouth every 4 (four) hours as needed for mild pain.    [provider]  furosemide (LASIX) 20 MG tablet Take 20 mg by mouth daily. 06/13/21   [provider]  Vitamin D, Ergocalciferol, (DRISDOL) 1.25 MG (50000 UNIT) CAPS capsule Take 50,000 Units by mouth once a week. 06/06/21   [provider]    Physical Exam: Vitals:   09/10/21 1200 09/10/21 1430 09/10/21 1445 09/10/21 1500  BP: 120/63 (!) 100/52 (!) 84/49   Pulse: 73     Resp: (!) 22 (!) 32 (!) 28   Temp:  (!) 92 F (33.3 C) (!) 92.2 F (33.4 C) (!) 92.4 F (33.6 C)  TempSrc:      SpO2: 100%     Weight:      Height:         Vitals:   09/10/21 1200 09/10/21 1430 09/10/21 1445 09/10/21 1500  BP: 120/63 (!) 100/52 (!) 84/49   Pulse: 73     Resp: (!) 22 (!) 32 (!) 28   Temp:  (!) 92 F (33.3 C) (!) 92.2 F (33.4 C) (!) 92.4 F (33.6 C)  TempSrc:      SpO2: 100%     Weight:      Height:          Constitutional: Lethargic but opens eyes to loud verbal stimuli. Not in any apparent distress.  On a Bair hugger HEENT:      Head: Normocephalic and atraumatic.         Eyes: PERLA, EOMI, Conjunctivae are normal. Sclera is non-icteric.       Mouth/Throat: Mucous membranes are moist.       Neck: Supple with no signs of meningismus. Cardiovascular: Bradycardic. No murmurs, gallops, or rubs. 2+ symmetrical distal pulses are present . No JVD. No LE edema Respiratory: Respiratory effort normal .faint crackles at the bases bilaterally. No wheezes or rhonchi.  Gastrointestinal: Soft, non tender, and non distended with positive bowel sounds.  Genitourinary: No CVA tenderness. Musculoskeletal: Nontender with normal range of motion in all extremities. No cyanosis, or erythema of extremities. Neurologic: Unable to assess Skin: Skin is warm, dry.  No rash or ulcers Psychiatric: Unable to assess   Labs on  Admission: I have personally reviewed following labs and imaging studies  CBC: Recent Labs  Lab 09/10/21 1045  WBC 5.1  HGB 9.0*  HCT 28.8*  MCV 101.1*  PLT 449*   Basic Metabolic Panel: Recent Labs  Lab 09/10/21 1045  NA 144  K 5.7*  CL 110  CO2 28  GLUCOSE 83  BUN 58*  CREATININE 2.40*  CALCIUM 9.1   GFR: Estimated Creatinine Clearance: 17.1 mL/min (A) (by C-G formula based on SCr of 2.4 mg/dL (H)). Liver Function Tests: Recent Labs  Lab 09/10/21 1045  AST 30  ALT 29  ALKPHOS 79  BILITOT 0.7  PROT 7.3  ALBUMIN 3.2*   No results for input(s): LIPASE, AMYLASE in the last 168 hours. No results  for input(s): AMMONIA in the last 168 hours. Coagulation Profile: Recent Labs  Lab 09/10/21 1105  INR 1.1   Cardiac Enzymes: No results for input(s): CKTOTAL, CKMB, CKMBINDEX, TROPONINI in the last 168 hours. BNP (last 3 results) No results for input(s): PROBNP in the last 8760 hours. HbA1C: No results for input(s): HGBA1C in the last 72 hours. CBG: No results for input(s): GLUCAP in the last 168 hours. Lipid Profile: No results for input(s): CHOL, HDL, LDLCALC, TRIG, CHOLHDL, LDLDIRECT in the last 72 hours. Thyroid Function Tests: No results for input(s): TSH, T4TOTAL, FREET4, T3FREE, THYROIDAB in the last 72 hours. Anemia Panel: No results for input(s): VITAMINB12, FOLATE, FERRITIN, TIBC, IRON, RETICCTPCT in the last 72 hours. Urine analysis:    Component Value Date/Time   COLORURINE YELLOW (A) 09/10/2021 1424   APPEARANCEUR CLEAR (A) 09/10/2021 1424   LABSPEC 1.012 09/10/2021 1424   PHURINE 5.0 09/10/2021 1424   GLUCOSEU NEGATIVE 09/10/2021 1424   HGBUR NEGATIVE 09/10/2021 1424   BILIRUBINUR NEGATIVE 09/10/2021 1424   KETONESUR NEGATIVE 09/10/2021 1424   PROTEINUR NEGATIVE 09/10/2021 1424   UROBILINOGEN 1.0 12/30/2014 1333   NITRITE POSITIVE (A) 09/10/2021 1424   LEUKOCYTESUR MODERATE (A) 09/10/2021 1424    Radiological Exams on Admission: CT HEAD  WO CONTRAST (5MM)  Result Date: 09/10/2021 CLINICAL DATA:  Altered mental status EXAM: CT HEAD WITHOUT CONTRAST TECHNIQUE: Contiguous axial images were obtained from the base of the skull through the vertex without intravenous contrast. COMPARISON:  08/18/2021 FINDINGS: Brain: No evidence of acute infarction, hemorrhage, hydrocephalus, extra-axial collection or mass lesion/mass effect. Periventricular and deep white matter hypodensity. Vascular: No hyperdense vessel or unexpected calcification. Skull: Normal. Negative for fracture or focal lesion. Sinuses/Orbits: No acute finding. Other: None. IMPRESSION: No acute intracranial pathology. Small-vessel white matter disease. Electronically Signed   By: Delanna Ahmadi M.D.   On: 09/10/2021 13:55   DG Chest Port 1 View  Result Date: 09/10/2021 CLINICAL DATA:  Difficulty breathing EXAM: PORTABLE CHEST 1 VIEW COMPARISON:  Previous radiographs including the examination of 06/18/2021 and CT done on 07/10/2021 FINDINGS: Transverse diameter of heart is increased. Central pulmonary vessels are more prominent. Increased interstitial markings are seen in the parahilar regions and lower lung fields. There is no focal consolidation. There is minimal blunting of left lateral CP angle. There is no pneumothorax. 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 IMPRESSION: Cardiomegaly. Central pulmonary vessels are more prominent suggesting CHF. There is diffuse increase in interstitial markings in both lungs, more so on the right side suggesting asymmetric pulmonary edema. Possibility of underlying pneumonia is not excluded. Small left pleural effusion. Electronically Signed   By: Elmer Picker M.D.   On: 09/10/2021 11:51     Assessment/Plan Principal Problem:   Acute metabolic encephalopathy Active Problems:   Acute lower UTI   Sepsis (HCC)   HTN (hypertension)   CKD stage 4 due to type 2 diabetes mellitus  (HCC)   Dementia with behavioral disturbance   COVID-19 virus infection   Hyperkalemia   Bradycardia       Patient is an 87 year old female who was sent to the emergency room for evaluation of change in mental status   Acute metabolic encephalopathy Multifactorial and related to underlying COVID-19 viral infection as well as UTI Patient has pyuria Hold all antipsychotic medications Expect improvement in patient's mental status following resolution of acute illness Strict aspiration precautions     Sepsis secondary to UTI  As evidenced by hypothermia, bradycardia, hypotension and pyuria. Continue aggressive IV fluid resuscitation Patient remains on a Bair hugger to improve temperature She received a dose of IV cefepime and vancomycin in the ER Prior urine culture yielded E. coli and Proteus mirabilis Place patient on Rocephin 1 g IV daily Follow-up results of culture     Bradycardia Multifactorial appears to be secondary to hyperkalemia as well as hypothermia Will treat hyperkalemia with dextrose, insulin and Lokelma Obtain TSH levels    COVID-19 virus infection Patient's vaccination status is unknown She is not hypoxic and does not require systemic steroids. Will place patient on remdesivir per protocol Supportive care with bronchodilator therapy as needed Place patient on contact and droplet precautions    Dementia with behavioral disturbance Stable     Diabetes mellitus with complications of stage IV chronic kidney disease Patient is n.p.o. due to her mental status changes Will place on dextrose containing fluids Check blood sugars every 4 hours    Hypertension Uncontrolled Will treat with IV hydralazine for systolic blood pressure greater than 175mmHg  DVT prophylaxis: Lovenox  Code Status: full code  Family Communication: Attempted to call patient's daughter Jolayne Haines as well as granddaughter Bethea.  No response at this time.  Left  voicemail awaiting callback Disposition Plan: Back to previous home environment Consults called: none  Status:At the time of admission, it appears that the appropriate admission status for this patient is inpatient. This is judged to be reasonable and necessary to provide the required intensity of service to ensure the patient's safety given the presenting symptoms, physical exam findings, and initial radiographic and laboratory data in the context of their comorbid conditions. Patient requires inpatient status due to high intensity of service, high risk for further deterioration and high frequency of surveillance required.     Collier Bullock MD [facility]     09/10/2021, 3:30 PM

## 2021-09-10 NOTE — ED Notes (Signed)
Patient repositioned in bed.  New external catheter placed.  Temperature 93.5 axillary.  Bair Hugger replaced.  Blankets applied.  Will continue to monitor

## 2021-09-10 NOTE — ED Notes (Signed)
Patient medicated per PRN order.  Repositioned in bed due to attempting to get out of bed on left side.  Patient now attempting to get out of bed on right side.  Continues to remain combative and pulling at medical devices

## 2021-09-10 NOTE — ED Notes (Signed)
Seizure pads placed on side rails to prevent patient from hurting herself

## 2021-09-10 NOTE — ED Notes (Signed)
Lab at bedside to draw blood with multiple nurses at bedside to help.

## 2021-09-10 NOTE — ED Notes (Signed)
Patient becoming combative and will not let RN preform CBG or vital signs.  Continues to pull at monitor cords and IV.

## 2021-09-10 NOTE — ED Notes (Signed)
Attempt made multiple times to get a temp on pt while in recliner in triage, oral and axillary attempted and using 2 different thermometers

## 2021-09-10 NOTE — ED Notes (Addendum)
Dr Kerman Passey notified rectal temp. Warm fluids hung. Bair hugger on pt

## 2021-09-10 NOTE — Sepsis Progress Note (Signed)
[facility] is following this sepsis

## 2021-09-10 NOTE — ED Notes (Addendum)
Marcene Brawn from Nursing facility called to check on patient. She stated that pt is normally alert and oriented to self only. Pt will normally eat and talk however over the weekend she was throwing her meal trays at other residents and yesterday was combative and nurse Marcene Brawn was a 1:1.

## 2021-09-10 NOTE — ED Notes (Signed)
Pt legs hanging over the side rails, pt removed all blankets, threw bear hugger off, removed EKG leads and pulled foley cath out with balloon still inflated.

## 2021-09-10 NOTE — ED Notes (Signed)
Dr Kerman Passey aware of difficulty getting 2nd cultures and abx to be started

## 2021-09-10 NOTE — ED Notes (Signed)
Multiple attempts second IV, no success
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
## 2021-09-10 NOTE — Consult Note (Signed)
PHARMACY -  BRIEF ANTIBIOTIC NOTE   Pharmacy has received consult(s) for Vancomycin and Cefepime from an ED provider.  The patient's profile has been reviewed for ht/wt/allergies/indication/available labs.    One time order(s) placed for Vancomycin 1750 mg IV and Cefepime 2g IV x 1 dose each.  Further antibiotics/pharmacy consults should be ordered by admitting physician if indicated.                       Thank you, Pearla Dubonnet 09/10/2021  11:08 AM

## 2021-09-10 NOTE — Consult Note (Signed)
CODE SEPSIS - PHARMACY COMMUNICATION  **Broad Spectrum Antibiotics should be administered within 1 hour of Sepsis diagnosis**  Time Code Sepsis Called/Page Received: 1111  Antibiotics Ordered: CFP/Vancomycin/Flagyl  Time of 1st antibiotic administration: 1210  Additional action taken by pharmacy: none  If necessary, Name of Provider/Nurse Contacted: n/a    Pearla Dubonnet ,PharmD Clinical Pharmacist  09/10/2021  12:21 PM

## 2021-09-10 NOTE — ED Triage Notes (Signed)
Pt arrives via ems from [facility], reported to have altered mental status, pt states that she was having a nervous breakdown, pt apears very sleepy at this time and is awakened to answer questions and falls back asleep. Pt is cool to tuch despite blankets on her, additional warm blankets given

## 2021-09-10 NOTE — Care Management Note (Deleted)
  Cross coverage note    Reason for call Hypotension with SBP in the 70s  Brief summary: Chart reviewed.  Patient came into the ER with a diagnosis of sepsis secondary to UTI presenting with hypothermia, hypotension and altered mental status Patient has received a total of 3400 mL fluid and is currently on maintenance at 125 mils follow-up   Subjective: Patient unable to contribute to history due to altered mental status and clinical state  Objective: Vitals:   09/10/21 2125 09/10/21 2140  BP: (!) 105/59 100/64  Pulse: 61 72  Resp: (!) 25 (!) 27  Temp:    SpO2: 98% 96%     Assessment: Severe sepsis, not fluid responsive  Plan: Will give 1 more liter LR and if not responding will do ICU consult for pressors. Family to be updated on prognosis given advanced age of 89  Plan discussed with the nurse over the phone

## 2021-09-11 DIAGNOSIS — A419 Sepsis, unspecified organism: Principal | ICD-10-CM

## 2021-09-11 DIAGNOSIS — N390 Urinary tract infection, site not specified: Secondary | ICD-10-CM | POA: Diagnosis not present

## 2021-09-11 DIAGNOSIS — G9341 Metabolic encephalopathy: Secondary | ICD-10-CM | POA: Diagnosis not present

## 2021-09-11 DIAGNOSIS — U071 COVID-19: Secondary | ICD-10-CM

## 2021-09-11 LAB — CBG MONITORING, ED
Glucose-Capillary: 49 mg/dL — ABNORMAL LOW (ref 70–99)
Glucose-Capillary: 74 mg/dL (ref 70–99)
Glucose-Capillary: 101 mg/dL — ABNORMAL HIGH (ref 70–99)
Glucose-Capillary: 87 mg/dL (ref 70–99)
Glucose-Capillary: 73 mg/dL (ref 70–99)
Glucose-Capillary: 76 mg/dL (ref 70–99)
Glucose-Capillary: 73 mg/dL (ref 70–99)

## 2021-09-11 LAB — IRON AND TIBC
Iron: 46 ug/dL (ref 28–170)
Saturation Ratios: 20 % (ref 10.4–31.8)
TIBC: 225 ug/dL — ABNORMAL LOW (ref 250–450)
UIBC: 179 ug/dL

## 2021-09-11 LAB — BASIC METABOLIC PANEL
Anion gap: 3 — ABNORMAL LOW (ref 5–15)
BUN: 49 mg/dL — ABNORMAL HIGH (ref 8–23)
CO2: 26 mmol/L (ref 22–32)
Calcium: 8.5 mg/dL — ABNORMAL LOW (ref 8.9–10.3)
Chloride: 114 mmol/L — ABNORMAL HIGH (ref 98–111)
Creatinine, Ser: 2.13 mg/dL — ABNORMAL HIGH (ref 0.44–1.00)
GFR, Estimated: 22 mL/min — ABNORMAL LOW (ref >60)
Glucose, Bld: 102 mg/dL — ABNORMAL HIGH (ref 70–99)
Potassium: 5.1 mmol/L (ref 3.5–5.1)
Sodium: 143 mmol/L (ref 135–145)

## 2021-09-11 LAB — D-DIMER, QUANTITATIVE: D-Dimer, Quant: 0.98 ug/mL-FEU — ABNORMAL HIGH (ref 0.00–0.50)

## 2021-09-11 LAB — URINE CULTURE

## 2021-09-11 LAB — CBC
HCT: 28.1 % — ABNORMAL LOW (ref 36.0–46.0)
Hemoglobin: 8.9 g/dL — ABNORMAL LOW (ref 12.0–15.0)
MCH: 32 pg (ref 26.0–34.0)
MCHC: 31.7 g/dL (ref 30.0–36.0)
MCV: 101.1 fL — ABNORMAL HIGH (ref 80.0–100.0)
Platelets: 106 10*3/uL — ABNORMAL LOW (ref 150–400)
RBC: 2.78 MIL/uL — ABNORMAL LOW (ref 3.87–5.11)
RDW: 15.6 % — ABNORMAL HIGH (ref 11.5–15.5)
WBC: 3.2 10*3/uL — ABNORMAL LOW (ref 4.0–10.5)
nRBC: 0 % (ref 0.0–0.2)

## 2021-09-11 LAB — GLUCOSE, CAPILLARY
Glucose-Capillary: 114 mg/dL — ABNORMAL HIGH (ref 70–99)
Glucose-Capillary: 52 mg/dL — ABNORMAL LOW (ref 70–99)
Glucose-Capillary: 76 mg/dL (ref 70–99)

## 2021-09-11 LAB — VITAMIN B12: Vitamin B-12: 237 pg/mL (ref 180–914)

## 2021-09-11 LAB — C-REACTIVE PROTEIN: CRP: 0.9 mg/dL (ref <1.0)

## 2021-09-11 MED ORDER — DEXTROSE 50 % IV SOLN: 25.0000 g | INTRAVENOUS | Status: AC

## 2021-09-11 MED ORDER — DEXTROSE 50 % IV SOLN
INTRAVENOUS | Status: AC
  Administered 2021-09-11: 50 mL
  Filled 2021-09-11: qty 50

## 2021-09-11 NOTE — ED Notes (Signed)
Patient resting quietly at this time.  VSS.  Respirations even and unlabored.  Will continue to monitor

## 2021-09-11 NOTE — ED Notes (Signed)
Unable to obtain CBG at this time.  Patient is combative with staff.

## 2021-09-11 NOTE — ED Notes (Signed)
Brandon RN aware of assigned bed °

## 2021-09-11 NOTE — Care Management Note (Signed)
Cross coverage Event note   Brief Narrative:  Patient admitted earlier with acute mental status changes and is currently undergoing a stroke work-up but she also had findings concerning for sepsis but as no source was found no further work-up was planned for sepsis however during the night she was hypotensive but patient was very agitated hitting the nurses preventing the provision of care     Subjective: Patient agitated, drowsy but arousable, restless unable to get history      Physical Exam Vitals and nursing note reviewed.  Constitutional:      Appearance: She is obese. She is not toxic-appearing.  HENT:     Head: Normocephalic and atraumatic.  Cardiovascular:     Rate and Rhythm: Normal rate.     Pulses: Normal pulses.     Heart sounds: Normal heart sounds.  Pulmonary:     Breath sounds: Wheezing present.  Abdominal:     General: Bowel sounds are normal.     Tenderness: There is no abdominal tenderness.  Neurological:     Mental Status: She is disoriented.  Psychiatric:        Behavior: Behavior is uncooperative, agitated and combative.      Assessment & Plan:   Active Problems:   Speech abnormality   Sepsis ([place])   Acute agitation - Initially ordered small dose of Ativan which did not help - Geodon 20 mg IM x1.(Did not order Haldol due to QT prolongation - Soft wrist restraints - Bedside sitter  Hypotension, suspecting severe sepsis - Patient presented with WBC 21,000 and lactic acidosis and hypotension though no source - Sepsis work-up ordered - Continue IV fluid resuscitation - Started IV cefepime vancomycin and Flagyl for sepsis of unknown source pending further work-up  Altered mentation - Likely related to acute problems above, underlying dementia as well as possible stroke - Try to control acute agitation to enable work-up for other illnesses above and stroke         CRITICAL CARE Performed by: Athena Masse   Total critical care time: 75  minutes   Critical care time was exclusive of separately billable procedures and treating other patients.   Critical care was necessary to treat or prevent imminent or life-threatening deterioration.   Critical care was time spent personally by me on the following activities: development of treatment plan with patient and/or surrogate as well as nursing, discussions with consultants, evaluation of patient's response to treatment, examination of patient, obtaining history from patient or surrogate, ordering and performing treatments and interventions, ordering and review of laboratory studies, ordering and review of radiographic studies, pulse oximetry and re-evaluation of patient's condition.

## 2021-09-11 NOTE — ED Notes (Signed)
Sitter at bedside for 1:1 patient observation.  Restraints held at this time.  Will continue to re-evaluation patient and monitor status.  Patient repositioned in bed and padding/pillow placed to help protect patient from injuring herself

## 2021-09-11 NOTE — ED Notes (Signed)
Rectal temp taken. Pt soiled brief. Pt changed. Purewick in placed. Warm blankets placed on pt.

## 2021-09-11 NOTE — ED Notes (Signed)
Able to get CBG on patient at this time.  Noted to be 51.  New second IV restarted.  Able to restart dextrose infusion.  Will recheck CBG shortly

## 2021-09-11 NOTE — ED Notes (Signed)
Pt resting in bed without any anxiety. Pt continues to not be in any restraint device at this time .

## 2021-09-11 NOTE — ED Notes (Signed)
Bair hugger replaced at this time

## 2021-09-11 NOTE — ED Notes (Addendum)
Report received from [place], [place]. Pt visualized in room by RN. Pt resting with respirations equal and unlabored. Pt with mitts in place for safety and to ensure IV access remains intact. Bed alarm in place. No other restraints used or needed at this time.

## 2021-09-11 NOTE — Progress Notes (Signed)
PROGRESS NOTE    [place] Dost  HDQ:[id number] DOB: Oct 19, 1930 DOA: 09/10/2021 PCP: Beckie Salts, MD    Brief Narrative:  [name] [name] is a 87 y.o. female with medical history significant for dementia, diabetes mellitus with complications of stage IV chronic kidney disease, hypertension and dyslipidemia who presents to the emergency room from the skilled nursing facility where she resides for evaluation of change in mental status and increased somnolence. She was found to have COVID, chest x-ray showed a central pulmonary vessel prominence.  Patient was also found to have abnormal urine, placed on Rocephin for UTI.  Assessment & Plan:   Principal Problem:   Acute metabolic encephalopathy Active Problems:   Acute lower UTI   Sepsis ([place])   HTN (hypertension)   CKD stage 4 due to type 2 diabetes mellitus (HCC)   Dementia with behavioral disturbance   COVID-19 virus infection   Hyperkalemia   Bradycardia  Sepsis is secondary to UTI. Urinary tract infection. Acute metabolic encephalopathy secondary to UTI and COVID. Baseline dementia. Sinus bradycardia. Patient met sepsis criteria at time admission with hypotension, leukopenia, hypothermia. This is thought to be secondary to UTI versus COVID. Will obtain cortisol level to rule out adrenal insufficiency. Will continue IV fluids for now. Continue antibiotics with Rocephin for UTI.  Chronic kidney disease stage IV. Hyperkalemia. Received Lokelma, potassium level had dropped down to 5.1.  Pancytopenia. Patient has significant leukopenia, anemia and thrombocytopenia.  We will check iron B12 level.  Type 2 diabetes with significant hypoglycemia. Continue D10 infusion until glucose is better.  Check cortisol level to rule out adrenal insufficiency.  COVID 19 infection. Patient does not have hypoxemia. Continue remdesivir.  No need for steroids.  Essential hypertension. Blood pressure was running low, hold all blood pressure  medicines.  Prognosis: Not able to reach patient daughter and granddaughter.  Patient long-term prognosis is very poor, will obtain palliative care consult.  DVT prophylaxis: Lovenox Code Status: full Family Communication: Not able to reach family Disposition Plan:    Status is: Inpatient  Remains inpatient appropriate because: Due to severity of disease, IV antibiotics, remdesivir.        I/O last 3 completed shifts: In: 1194 [I.V.:1890; IV Piggyback:2801] Out: 320 [Urine:320] No intake/output data recorded.     Consultants:  None  Procedures: None  Antimicrobials: Rocephin  Subjective: Patient is very confused, she opens eyes only to command. Does not seem to have any short of breath, no hypoxia. She is not in pain, no nausea vomiting. No fever.  Objective: Vitals:   09/11/21 0930 09/11/21 1000 09/11/21 1026 09/11/21 1040  BP: (!) 133/45 (!) 117/55    Pulse: 66 60 63 79  Resp: $Remo'14 14 17 20  'orOJY$ Temp:   (!) 96.6 F (35.9 C) (!) 97 F (36.1 C)  TempSrc:   Axillary Rectal  SpO2: 97% 97% 97% 99%  Weight:      Height:        Intake/Output Summary (Last 24 hours) at 09/11/2021 1214 Last data filed at 09/10/2021 2358 Gross per 24 hour  Intake 4690.96 ml  Output 320 ml  Net 4370.96 ml   Filed Weights   09/10/21 1028  Weight: 85 kg    Examination:  General exam: Ill-appearing. Respiratory system: Rhonchi in the base bilaterally. Respiratory effort normal. Cardiovascular system: S1 & S2 heard, RRR. No JVD, murmurs, rubs, gallops or clicks. No pedal edema. Gastrointestinal system: Abdomen is nondistended, soft and nontender. No organomegaly or masses felt. Normal bowel sounds  heard. Central nervous system: Alert and oriented x1.  No focal neurological deficits. Extremities: 1+ Edema in LEs Skin: No rashes, lesions or ulcers     Data Reviewed: I have personally reviewed following labs and imaging studies  CBC: Recent Labs  Lab 09/10/21 1045  09/11/21 0620  WBC 5.1 3.2*  HGB 9.0* 8.9*  HCT 28.8* 28.1*  MCV 101.1* 101.1*  PLT 140* 248*   Basic Metabolic Panel: Recent Labs  Lab 09/10/21 1045 09/10/21 1534 09/11/21 0620  NA 144 144 143  K 5.7* 5.1 5.1  CL 110 113* 114*  CO2 $Re'28 25 26  'iuo$ GLUCOSE 83 58* 102*  BUN 58* 54* 49*  CREATININE 2.40* 2.37* 2.13*  CALCIUM 9.1 9.0 8.5*   GFR: Estimated Creatinine Clearance: 19.3 mL/min (A) (by C-G formula based on SCr of 2.13 mg/dL (H)). Liver Function Tests: Recent Labs  Lab 09/10/21 1045  AST 30  ALT 29  ALKPHOS 79  BILITOT 0.7  PROT 7.3  ALBUMIN 3.2*   No results for input(s): LIPASE, AMYLASE in the last 168 hours. No results for input(s): AMMONIA in the last 168 hours. Coagulation Profile: Recent Labs  Lab 09/10/21 1105  INR 1.1   Cardiac Enzymes: No results for input(s): CKTOTAL, CKMB, CKMBINDEX, TROPONINI in the last 168 hours. BNP (last 3 results) No results for input(s): PROBNP in the last 8760 hours. HbA1C: No results for input(s): HGBA1C in the last 72 hours. CBG: Recent Labs  Lab 09/11/21 0200 09/11/21 0616 09/11/21 0824 09/11/21 1023 09/11/21 1205  GLUCAP 74 101* 87 73 76   Lipid Profile: No results for input(s): CHOL, HDL, LDLCALC, TRIG, CHOLHDL, LDLDIRECT in the last 72 hours. Thyroid Function Tests: Recent Labs    09/10/21 1534  TSH 5.296*   Anemia Panel: No results for input(s): VITAMINB12, FOLATE, FERRITIN, TIBC, IRON, RETICCTPCT in the last 72 hours. Sepsis Labs: Recent Labs  Lab 09/10/21 1105 09/10/21 1537  LATICACIDVEN 1.0 1.3    Recent Results (from the past 240 hour(s))  Blood Culture (routine x 2)     Status: None (Preliminary result)   Collection Time: 09/10/21 11:05 AM   Specimen: BLOOD  Result Value Ref Range Status   Specimen Description BLOOD BLOOD RIGHT FOREARM  Final   Special Requests   Final    BOTTLES DRAWN AEROBIC AND ANAEROBIC Blood Culture adequate volume   Culture   Final    NO GROWTH < 24  HOURS Performed at [facility], [address] [place], [place] [address]    Report Status PENDING  Incomplete  Resp Panel by RT-PCR (Flu A&B, Covid) In/Out Cath Urine     Status: Abnormal   Collection Time: 09/10/21 11:37 AM   Specimen: In/Out Cath Urine; Nasopharyngeal(NP) swabs in vial transport medium  Result Value Ref Range Status   SARS Coronavirus 2 by RT PCR POSITIVE (A) NEGATIVE Final    Comment: RESULT CALLED TO, READ BACK BY AND VERIFIED WITH: JENNIFER BELCHER 09/10/21 1313 KLW (NOTE) SARS-CoV-2 target nucleic acids are DETECTED.  The SARS-CoV-2 RNA is generally detectable in upper respiratory specimens during the acute phase of infection. Positive results are indicative of the presence of the identified virus, but do not rule out bacterial infection or co-infection with other pathogens not detected by the test. Clinical correlation with patient history and other diagnostic information is necessary to determine patient infection status. The expected result is Negative.  Fact Sheet for Patients: EntrepreneurPulse.com.au  Fact Sheet for Healthcare Providers: IncredibleEmployment.be  This test  is not yet approved or cleared by the [place] and  has been authorized for detection and/or diagnosis of SARS-CoV-2 by FDA under an Emergency Use Authorization (EUA).  This EUA will remain in effect (meaning this test can be  used) for the duration of  the COVID-19 declaration under Section 564(b)(1) of the Act, 21 U.S.C. section 360bbb-3(b)(1), unless the authorization is terminated or revoked sooner.     Influenza A by PCR NEGATIVE NEGATIVE Final   Influenza B by PCR NEGATIVE NEGATIVE Final    Comment: (NOTE) The Xpert Xpress SARS-CoV-2/FLU/RSV plus assay is intended as an aid in the diagnosis of influenza from Nasopharyngeal swab specimens and should not be used as a sole basis for treatment. Nasal washings  and aspirates are unacceptable for Xpert Xpress SARS-CoV-2/FLU/RSV testing.  Fact Sheet for Patients: EntrepreneurPulse.com.au  Fact Sheet for Healthcare Providers: IncredibleEmployment.be  This test is not yet approved or cleared by the [place] FDA and has been authorized for detection and/or diagnosis of SARS-CoV-2 by FDA under an Emergency Use Authorization (EUA). This EUA will remain in effect (meaning this test can be used) for the duration of the COVID-19 declaration under Section 564(b)(1) of the Act, 21 U.S.C. section 360bbb-3(b)(1), unless the authorization is terminated or revoked.  Performed at [facility], [address], [place], [place] [address]   Blood Culture (routine x 2)     Status: None (Preliminary result)   Collection Time: 09/10/21 11:37 AM   Specimen: BLOOD  Result Value Ref Range Status   Specimen Description BLOOD BLOOD RIGHT HAND  Final   Special Requests   Final    BOTTLES DRAWN AEROBIC AND ANAEROBIC Blood Culture results may not be optimal due to an inadequate volume of blood received in culture bottles   Culture   Final    NO GROWTH < 24 HOURS Performed at [facility], [address], [place], [place] [address]    Report Status PENDING  Incomplete         Radiology Studies: CT HEAD WO CONTRAST (5MM)  Result Date: 09/10/2021 CLINICAL DATA:  Altered mental status EXAM: CT HEAD WITHOUT CONTRAST TECHNIQUE: Contiguous axial images were obtained from the base of the skull through the vertex without intravenous contrast. COMPARISON:  08/18/2021 FINDINGS: Brain: No evidence of acute infarction, hemorrhage, hydrocephalus, extra-axial collection or mass lesion/mass effect. Periventricular and deep white matter hypodensity. Vascular: No hyperdense vessel or unexpected calcification. Skull: Normal. Negative for fracture or focal lesion. Sinuses/Orbits: No acute finding. Other: None. IMPRESSION:  No acute intracranial pathology. Small-vessel white matter disease. Electronically Signed   By: Delanna Ahmadi M.D.   On: 09/10/2021 13:55   CT CHEST WO CONTRAST  Result Date: 09/10/2021 CLINICAL DATA:  Pneumonia, effusion or abscess suspected, xray done EXAM: CT CHEST WITHOUT CONTRAST TECHNIQUE: Multidetector CT imaging of the chest was performed following the standard protocol without IV contrast. COMPARISON:  Chest radiograph same day, chest CT 07/10/2021 FINDINGS: Cardiovascular: Normal cardiac size.No pericardial disease.Normal size main and branch pulmonary arteries.Mild atherosclerotic calcifications of the thoracic aorta. The ascending aorta measures up to 4.0 cm, unchanged. Mediastinum/Nodes: No lymphadenopathy.The thyroid is unremarkable.Esophagus is unremarkable.The trachea is unremarkable. Lungs/Pleura: There are small bilateral pleural effusions, new since the prior exam. There are multiple bilateral pulmonary nodules and areas tree-in-bud nodularity, similar to prior exam, largest nodule measuring 8 mm in the right lower lobe (series 4, image 73), unchanged. No pneumothorax. Bibasilar atelectasis. Upper Abdomen: Prior cholecystectomy.  No acute abnormality.  Musculoskeletal: Chronic posttraumatic deformity of the left proximal humerus with likely pseudoarthrosis, partially visualized, and with unchanged multiple retained foreign bodies.There are subacute fractures of the left anterior third, fifth, and sixth ribs. There are new acute left anterolateral eighth and ninth rib fractures. IMPRESSION: New acute left anterolateral eighth and ninth rib fractures. Subacute left anterior third, fifth, and sixth rib fractures. New small bilateral pleural effusions. Similar diffuse scattered pulmonary nodules and tree-in-bud nodularity bilaterally, largest nodule measuring 8 mm in the right lower lobe, possibly representing a chronic atypical infectious process as seen on multiple prior exams. Stable size of the  ascending aorta, measuring 4.0 cm. Recommend annual imaging followup by CTA or MRA. This recommendation follows 2010 ACCF/AHA/AATS/ACR/ASA/SCA/SCAI/SIR/STS/SVM Guidelines for the Diagnosis and Management of Patients with Thoracic Aortic Disease. Circulation.2010; 121: H852-D782. Aortic aneurysm NOS (ICD10-I71.9) Aortic Atherosclerosis (ICD10-I70.0). 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 Electronically Signed   By: Maurine Simmering M.D.   On: 09/10/2021 14:10   DG Chest Port 1 View  Result Date: 09/10/2021 CLINICAL DATA:  Difficulty breathing EXAM: PORTABLE CHEST 1 VIEW COMPARISON:  Previous radiographs including the examination of 06/18/2021 and CT done on 07/10/2021 FINDINGS: Transverse diameter of heart is increased. Central pulmonary vessels are more prominent. Increased interstitial markings are seen in the parahilar regions and lower lung fields. There is no focal consolidation. There is minimal blunting of left lateral CP angle. There is no pneumothorax. 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 IMPRESSION: Cardiomegaly. Central pulmonary vessels are more prominent suggesting CHF. There is diffuse increase in interstitial markings in both lungs, more so on the right side suggesting asymmetric pulmonary edema. Possibility of underlying pneumonia is not excluded. Small left pleural effusion. Electronically Signed   By: Elmer Picker M.D.   On: 09/10/2021 11:51        Scheduled Meds:  albuterol  2 puff Inhalation Q6H   vitamin C  500 mg Oral Daily   enoxaparin (LOVENOX) injection  30 mg Subcutaneous Q24H   insulin aspart  5 Units Intravenous Once   sodium zirconium cyclosilicate  10 g Oral Daily   zinc sulfate  220 mg Oral Daily   Continuous Infusions:  cefTRIAXone (ROCEPHIN)  IV     dextrose 10 % and 0.45 % NaCl 100 mL/hr at 09/11/21 4235   remdesivir 100 mg in NS 100 mL       LOS: 1 day    Time spent:  35 minutes    Sharen Hones, MD [facility]   To contact the attending provider between 7A-7P or the covering provider during after hours 7P-7A, please log into the web site [email or web address] and access using universal [place] password for that web site. If you do not have the password, please call the hospital operator.  09/11/2021, 12:14 PM

## 2021-09-11 NOTE — ED Notes (Addendum)
Report given to [facility], Therapist, sports. Report included SBAR. RN made aware pt is in non-restrictive mitts for pt safety and to ensure IV access remains intact. Pt continues resting with eyes closed at this time.

## 2021-09-11 NOTE — Progress Notes (Signed)
Patient arrived to floor very sleepy, will open eyes when touched and then goes back to sleep. Bilateral mitts on. Call bell within reach. Bed alarm on.

## 2021-09-11 NOTE — ED Notes (Signed)
Pharmacy messaged in regard to ED pyxis being out of stock of remdesivir

## 2021-09-11 NOTE — ED Notes (Signed)
RN received report at this time. Pt is free from any restraint devices at this time. Pt has mitts on at this time and is sleeping. Resp equal and unlabored.

## 2021-09-11 NOTE — ED Notes (Signed)
Pt very anxious at this time. RN to give PRN ativan for pt anxiety. Pt is ripping off cardiac monitor and bed sheets as well as attempting to spin herself around in the bed. Pt continues to remain free of any restraint devices at this time. Mitts are placed on pt hands.

## 2021-09-11 NOTE — Progress Notes (Signed)
Patient [name] [name] 54, give dextrose and ordered ivf initiated. Oncoming RN to recheck [name] [name].

## 2021-09-12 DIAGNOSIS — U071 COVID-19: Secondary | ICD-10-CM | POA: Diagnosis not present

## 2021-09-12 DIAGNOSIS — F03918 Unspecified dementia, unspecified severity, with other behavioral disturbance: Secondary | ICD-10-CM | POA: Diagnosis not present

## 2021-09-12 DIAGNOSIS — N390 Urinary tract infection, site not specified: Secondary | ICD-10-CM | POA: Diagnosis not present

## 2021-09-12 DIAGNOSIS — L8990 Pressure ulcer of unspecified site, unspecified stage: Secondary | ICD-10-CM | POA: Insufficient documentation

## 2021-09-12 DIAGNOSIS — G9341 Metabolic encephalopathy: Secondary | ICD-10-CM | POA: Diagnosis not present

## 2021-09-12 LAB — COMPREHENSIVE METABOLIC PANEL
ALT: 32 U/L (ref 0–44)
AST: 32 U/L (ref 15–41)
Albumin: 3.1 g/dL — ABNORMAL LOW (ref 3.5–5.0)
Alkaline Phosphatase: 91 U/L (ref 38–126)
Anion gap: 6 (ref 5–15)
BUN: 40 mg/dL — ABNORMAL HIGH (ref 8–23)
CO2: 25 mmol/L (ref 22–32)
Calcium: 9 mg/dL (ref 8.9–10.3)
Chloride: 113 mmol/L — ABNORMAL HIGH (ref 98–111)
Creatinine, Ser: 2.09 mg/dL — ABNORMAL HIGH (ref 0.44–1.00)
GFR, Estimated: 22 mL/min — ABNORMAL LOW (ref >60)
Glucose, Bld: 71 mg/dL (ref 70–99)
Potassium: 5.3 mmol/L — ABNORMAL HIGH (ref 3.5–5.1)
Sodium: 144 mmol/L (ref 135–145)
Total Bilirubin: 0.7 mg/dL (ref 0.3–1.2)
Total Protein: 7.2 g/dL (ref 6.5–8.1)

## 2021-09-12 LAB — GLUCOSE, CAPILLARY
Glucose-Capillary: 104 mg/dL — ABNORMAL HIGH (ref 70–99)
Glucose-Capillary: 172 mg/dL — ABNORMAL HIGH (ref 70–99)
Glucose-Capillary: 61 mg/dL — ABNORMAL LOW (ref 70–99)
Glucose-Capillary: 64 mg/dL — ABNORMAL LOW (ref 70–99)
Glucose-Capillary: 69 mg/dL — ABNORMAL LOW (ref 70–99)
Glucose-Capillary: 70 mg/dL (ref 70–99)
Glucose-Capillary: 72 mg/dL (ref 70–99)
Glucose-Capillary: 73 mg/dL (ref 70–99)
Glucose-Capillary: 75 mg/dL (ref 70–99)
Glucose-Capillary: 78 mg/dL (ref 70–99)
Glucose-Capillary: 80 mg/dL (ref 70–99)
Glucose-Capillary: 90 mg/dL (ref 70–99)
Glucose-Capillary: 92 mg/dL (ref 70–99)
Glucose-Capillary: 93 mg/dL (ref 70–99)
Glucose-Capillary: 99 mg/dL (ref 70–99)

## 2021-09-12 LAB — CBC WITH DIFFERENTIAL/PLATELET
Abs Immature Granulocytes: 0.02 10*3/uL (ref 0.00–0.07)
Basophils Absolute: 0 10*3/uL (ref 0.0–0.1)
Basophils Relative: 0 %
Eosinophils Absolute: 0.1 10*3/uL (ref 0.0–0.5)
Eosinophils Relative: 2 %
HCT: 29.5 % — ABNORMAL LOW (ref 36.0–46.0)
Hemoglobin: 8.9 g/dL — ABNORMAL LOW (ref 12.0–15.0)
Immature Granulocytes: 0 %
Lymphocytes Relative: 22 %
Lymphs Abs: 1.1 10*3/uL (ref 0.7–4.0)
MCH: 30.6 pg (ref 26.0–34.0)
MCHC: 30.2 g/dL (ref 30.0–36.0)
MCV: 101.4 fL — ABNORMAL HIGH (ref 80.0–100.0)
Monocytes Absolute: 0.8 10*3/uL (ref 0.1–1.0)
Monocytes Relative: 17 %
Neutro Abs: 3 10*3/uL (ref 1.7–7.7)
Neutrophils Relative %: 59 %
Platelets: 138 10*3/uL — ABNORMAL LOW (ref 150–400)
RBC: 2.91 MIL/uL — ABNORMAL LOW (ref 3.87–5.11)
RDW: 15.6 % — ABNORMAL HIGH (ref 11.5–15.5)
WBC: 5 10*3/uL (ref 4.0–10.5)
nRBC: 0 % (ref 0.0–0.2)

## 2021-09-12 LAB — C-REACTIVE PROTEIN: CRP: 1 mg/dL — ABNORMAL HIGH (ref <1.0)

## 2021-09-12 LAB — D-DIMER, QUANTITATIVE: D-Dimer, Quant: 1.65 ug/mL-FEU — ABNORMAL HIGH (ref 0.00–0.50)

## 2021-09-12 LAB — MAGNESIUM: Magnesium: 1.7 mg/dL (ref 1.7–2.4)

## 2021-09-12 LAB — CORTISOL-AM, BLOOD: Cortisol - AM: 10.6 ug/dL (ref 6.7–22.6)

## 2021-09-12 MED ORDER — MEGESTROL ACETATE 400 MG/10ML PO SUSP
400.0000 mg | Freq: Every day | ORAL | Status: DC
  Administered 2021-09-13 – 2021-09-20 (×7): 400 mg via ORAL
  Filled 2021-09-12 (×9): qty 10

## 2021-09-12 MED ORDER — DEXTROSE 50 % IV SOLN
12.5000 g | INTRAVENOUS | Status: AC
  Administered 2021-09-12: 12.5 g via INTRAVENOUS
  Filled 2021-09-12: qty 50

## 2021-09-12 NOTE — Progress Notes (Signed)
   09/12/21 1441  ECG Monitoring  Ectopy Sinus pause (2.01 sec. pause.)  MD notified and aware  Haydee Salter, RN
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
## 2021-09-12 NOTE — Progress Notes (Signed)
Cross Cover Patient admitted with acute metabolic encephalopathy amd Covid 19 infection, reported  from nurse, to have bradycardia, and now significantly hypothermic.  TSH above 5 on work up so far. Checking T3 free T4 and cortisol.  BP thus far stable.  Will recheck CBC , BNP, procalcitonin, magnesium and BMP Prior chest xray with pulmonary edema  Current IV fluids D10, 1.2 NS secondary to border high sodium, worsening raal function and low blood sugars.  Frequent admissions form SNF for vertigo, syncope, falls and lower extremity edema.  Has diastolic heart failure EF 50-55% on last echo 06/2021  Labs with significant hypomagnasemia. Otherwise shows improving renal function. WBC now alightly low and HGB decrease. Could for dilutional.  Procalcitonin also without elevation. Likely bradycardia and hypothermia from sever dehydration. IVF continued and continuing frequent cbg monitoring

## 2021-09-12 NOTE — ED Notes (Signed)
Patient remains in bed.  Not attempting to remove medical and monitor equipment.  Retail banker in place.  RN able to provide care as needed.  VSS.  Respirations even and unlabored.  Soft wrist restraints have not been applied and remain off of patient.  Damita Dunnings, MD aware and order remains held at this time.  Will continue to monitor and update as needed.

## 2021-09-12 NOTE — ED Notes (Signed)
Patient resting quietly at this time.  1:1 sitter at bedside for safety.  Patient not pulling at monitoring equipment at this time.  RN able to provide care.  Soft wrist restraints not applied.  Will continue to hold restraints and monitor patient.  Damita Dunnings, MD continues to be made aware and updated on patient status.

## 2021-09-12 NOTE — Progress Notes (Signed)
PROGRESS NOTE    [name] [name]  GQB:[id number] DOB: Mar 03, 1930 DOA: 09/10/2021 PCP: Beckie Salts, MD    Brief Narrative:  [name] [name] is a 87 y.o. female with medical history significant for dementia, diabetes mellitus with complications of stage IV chronic kidney disease, hypertension and dyslipidemia who presents to the emergency room from the skilled nursing facility where she resides for evaluation of change in mental status and increased somnolence. She was found to have COVID, chest x-ray showed a central pulmonary vessel prominence.  Patient was also found to have abnormal urine, placed on Rocephin for UTI.   Assessment & Plan:   Principal Problem:   Acute metabolic encephalopathy Active Problems:   Acute lower UTI   Sepsis ([place])   HTN (hypertension)   CKD stage 4 due to type 2 diabetes mellitus (HCC)   Dementia with behavioral disturbance   COVID-19 virus infection   Hyperkalemia   Bradycardia   UTI (urinary tract infection)   Pressure injury of skin  Sepsis is secondary to UTI. Urinary tract infection. Acute metabolic encephalopathy secondary to UTI and COVID. Baseline dementia. Sinus bradycardia. Urine culture showed multiple species, blood cultures so far negative. Continue Rocephin. Continue remdesivir.  Chronic kidney disease stage IV. Hyperkalemia. Continue IV fluids. Continue daily Lokelma.  Type 2 diabetes with hypoglycemia. Anorexia. Cortisol level 10.6, patient has a severe anorexia.  Glucose still running low.  I will increase D10 to 100 mL/hr. Patient had very little p.o. intake.  We will add megestrol. Patient long-term prognosis very poor at this point, palliative care consult is pending.  COVID-19 infection. Pancytopenia Continue remdesivir, still no hypoxemia Pancytopenia appears to be secondary to COVID.  Goal of care discussion. Discussed with patient daughter, patient has a severe dementia, her condition may be terminal.  At this point, she  will be DO NOT RESUSCITATE status.  Daughter is agreeable to this.  If her condition does not improve, we may consider transition to comfort care.  We will continue discussion about this.   DVT prophylaxis: Lovenox Code Status: full Family Communication: Daughter updated. Disposition Plan:      Status is: Inpatient   Remains inpatient appropriate because: Due to severity of disease, IV antibiotics, remdesivir.      I/O last 3 completed shifts: In: 3837.8 [I.V.:2557.8; IV Piggyback:1280] Out: 500 [Urine:500] No intake/output data recorded.   Consultants:  None   Procedures: None   Antimicrobials: Rocephin     Subjective: Patient still has significant confusion, very poor appetite.  But no nausea vomiting or pain. Persistent hypoglycemia on D10 drip. No hypoxia or shortness of breath. No fever or chills.  Objective: Vitals:   09/12/21 0000 09/12/21 0300 09/12/21 0420 09/12/21 0916  BP:   (!) 157/66 132/81  Pulse:  71 74 75  Resp: 15 16 16 18   Temp:   (!) 97.4 F (36.3 C) 98.4 F (36.9 C)  TempSrc:   Axillary Oral  SpO2: 98% 97% 97% 100%  Weight:      Height:        Intake/Output Summary (Last 24 hours) at 09/12/2021 1331 Last data filed at 09/12/2021 0700 Gross per 24 hour  Intake 2837.75 ml  Output 500 ml  Net 2337.75 ml   Filed Weights   09/10/21 1028  Weight: 85 kg    Examination:  General exam: Ill-appearing. Respiratory system: Clear to auscultation. Respiratory effort normal. Cardiovascular system: S1 & S2 heard, RRR. No JVD, murmurs, rubs, gallops or clicks. No pedal edema. Gastrointestinal system:  Abdomen is nondistended, soft and nontender. No organomegaly or masses felt. Normal bowel sounds heard. Central nervous system: Alert and oriented x1. No focal neurological deficits. Extremities: Symmetric 5 x 5 power. Skin: No rashes, lesions or ulcers Psychiatry: Mood & affect appropriate.     Data Reviewed: I have personally reviewed  following labs and imaging studies  CBC: Recent Labs  Lab 09/10/21 1045 09/11/21 0620 09/12/21 0639  WBC 5.1 3.2* 5.0  NEUTROABS  --   --  3.0  HGB 9.0* 8.9* 8.9*  HCT 28.8* 28.1* 29.5*  MCV 101.1* 101.1* 101.4*  PLT 140* 106* 989*   Basic Metabolic Panel: Recent Labs  Lab 09/10/21 1045 09/10/21 1534 09/11/21 0620 09/12/21 0639  NA 144 144 143 144  K 5.7* 5.1 5.1 5.3*  CL 110 113* 114* 113*  CO2 28 25 26 25   GLUCOSE 83 58* 102* 71  BUN 58* 54* 49* 40*  CREATININE 2.40* 2.37* 2.13* 2.09*  CALCIUM 9.1 9.0 8.5* 9.0  MG  --   --   --  1.7   GFR: Estimated Creatinine Clearance: 19.6 mL/min (A) (by C-G formula based on SCr of 2.09 mg/dL (H)). Liver Function Tests: Recent Labs  Lab 09/10/21 1045 09/12/21 0639  AST 30 32  ALT 29 32  ALKPHOS 79 91  BILITOT 0.7 0.7  PROT 7.3 7.2  ALBUMIN 3.2* 3.1*   No results for input(s): LIPASE, AMYLASE in the last 168 hours. No results for input(s): AMMONIA in the last 168 hours. Coagulation Profile: Recent Labs  Lab 09/10/21 1105  INR 1.1   Cardiac Enzymes: No results for input(s): CKTOTAL, CKMB, CKMBINDEX, TROPONINI in the last 168 hours. BNP (last 3 results) No results for input(s): PROBNP in the last 8760 hours. HbA1C: No results for input(s): HGBA1C in the last 72 hours. CBG: Recent Labs  Lab 09/12/21 0612 09/12/21 0832 09/12/21 1043 09/12/21 1235 09/12/21 1255  GLUCAP 75 80 99 61* 64*   Lipid Profile: No results for input(s): CHOL, HDL, LDLCALC, TRIG, CHOLHDL, LDLDIRECT in the last 72 hours. Thyroid Function Tests: Recent Labs    09/10/21 1534  TSH 5.296*   Anemia Panel: Recent Labs    09/11/21 0620 09/11/21 1649  VITAMINB12  --  237  TIBC 225*  --   IRON 46  --    Sepsis Labs: Recent Labs  Lab 09/10/21 1105 09/10/21 1537  LATICACIDVEN 1.0 1.3    Recent Results (from the past 240 hour(s))  Blood Culture (routine x 2)     Status: None (Preliminary result)   Collection Time: 09/10/21 11:05  AM   Specimen: BLOOD  Result Value Ref Range Status   Specimen Description BLOOD BLOOD RIGHT FOREARM  Final   Special Requests   Final    BOTTLES DRAWN AEROBIC AND ANAEROBIC Blood Culture adequate volume   Culture   Final    NO GROWTH 2 DAYS Performed at [facility], [address], [place], [place] [address]    Report Status PENDING  Incomplete  Resp Panel by RT-PCR (Flu A&B, Covid) In/Out Cath Urine     Status: Abnormal   Collection Time: 09/10/21 11:37 AM   Specimen: In/Out Cath Urine; Nasopharyngeal(NP) swabs in vial transport medium  Result Value Ref Range Status   SARS Coronavirus 2 by RT PCR POSITIVE (A) NEGATIVE Final    Comment: RESULT CALLED TO, READ BACK BY AND VERIFIED WITH: JENNIFER BELCHER 09/10/21 1313 KLW (NOTE) SARS-CoV-2 target nucleic acids are DETECTED.  The SARS-CoV-2 RNA  is generally detectable in upper respiratory specimens during the acute phase of infection. Positive results are indicative of the presence of the identified virus, but do not rule out bacterial infection or co-infection with other pathogens not detected by the test. Clinical correlation with patient history and other diagnostic information is necessary to determine patient infection status. The expected result is Negative.  Fact Sheet for Patients: EntrepreneurPulse.com.au  Fact Sheet for Healthcare Providers: IncredibleEmployment.be  This test is not yet approved or cleared by the [place] FDA and  has been authorized for detection and/or diagnosis of SARS-CoV-2 by FDA under an Emergency Use Authorization (EUA).  This EUA will remain in effect (meaning this test can be  used) for the duration of  the COVID-19 declaration under Section 564(b)(1) of the Act, 21 U.S.C. section 360bbb-3(b)(1), unless the authorization is terminated or revoked sooner.     Influenza A by PCR NEGATIVE NEGATIVE Final   Influenza B by PCR NEGATIVE NEGATIVE  Final    Comment: (NOTE) The Xpert Xpress SARS-CoV-2/FLU/RSV plus assay is intended as an aid in the diagnosis of influenza from Nasopharyngeal swab specimens and should not be used as a sole basis for treatment. Nasal washings and aspirates are unacceptable for Xpert Xpress SARS-CoV-2/FLU/RSV testing.  Fact Sheet for Patients: EntrepreneurPulse.com.au  Fact Sheet for Healthcare Providers: IncredibleEmployment.be  This test is not yet approved or cleared by the [place] FDA and has been authorized for detection and/or diagnosis of SARS-CoV-2 by FDA under an Emergency Use Authorization (EUA). This EUA will remain in effect (meaning this test can be used) for the duration of the COVID-19 declaration under Section 564(b)(1) of the Act, 21 U.S.C. section 360bbb-3(b)(1), unless the authorization is terminated or revoked.  Performed at [facility], [address] [place], [place] [address]   Blood Culture (routine x 2)     Status: None (Preliminary result)   Collection Time: 09/10/21 11:37 AM   Specimen: BLOOD  Result Value Ref Range Status   Specimen Description BLOOD BLOOD RIGHT HAND  Final   Special Requests   Final    BOTTLES DRAWN AEROBIC AND ANAEROBIC Blood Culture results may not be optimal due to an inadequate volume of blood received in culture bottles   Culture   Final    NO GROWTH 2 DAYS Performed at [facility], [address], [place], [place] [address]    Report Status PENDING  Incomplete  Urine Culture     Status: Abnormal   Collection Time: 09/10/21  2:24 PM   Specimen: In/Out Cath Urine  Result Value Ref Range Status   Specimen Description   Final    IN/OUT CATH URINE Performed at [facility], [address], [place], [place] [address]    Special Requests   Final    NONE Performed at  [facility], [address], [place], [place] [address]    Culture MULTIPLE SPECIES  PRESENT, SUGGEST RECOLLECTION (A)  Final   Report Status 09/11/2021 FINAL  Final         Radiology Studies: CT HEAD WO CONTRAST (5MM)  Result Date: 09/10/2021 CLINICAL DATA:  Altered mental status EXAM: CT HEAD WITHOUT CONTRAST TECHNIQUE: Contiguous axial images were obtained from the base of the skull through the vertex without intravenous contrast. COMPARISON:  08/18/2021 FINDINGS: Brain: No evidence of acute infarction, hemorrhage, hydrocephalus, extra-axial collection or mass lesion/mass effect. Periventricular and deep white matter hypodensity. Vascular: No hyperdense vessel or unexpected calcification. Skull: Normal. Negative for  fracture or focal lesion. Sinuses/Orbits: No acute finding. Other: None. IMPRESSION: No acute intracranial pathology. Small-vessel white matter disease. Electronically Signed   By: Delanna Ahmadi M.D.   On: 09/10/2021 13:55   CT CHEST WO CONTRAST  Result Date: 09/10/2021 CLINICAL DATA:  Pneumonia, effusion or abscess suspected, xray done EXAM: CT CHEST WITHOUT CONTRAST TECHNIQUE: Multidetector CT imaging of the chest was performed following the standard protocol without IV contrast. COMPARISON:  Chest radiograph same day, chest CT 07/10/2021 FINDINGS: Cardiovascular: Normal cardiac size.No pericardial disease.Normal size main and branch pulmonary arteries.Mild atherosclerotic calcifications of the thoracic aorta. The ascending aorta measures up to 4.0 cm, unchanged. Mediastinum/Nodes: No lymphadenopathy.The thyroid is unremarkable.Esophagus is unremarkable.The trachea is unremarkable. Lungs/Pleura: There are small bilateral pleural effusions, new since the prior exam. There are multiple bilateral pulmonary nodules and areas tree-in-bud nodularity, similar to prior exam, largest nodule measuring 8 mm in the right lower lobe (series 4, image 73), unchanged. No pneumothorax. Bibasilar atelectasis. Upper Abdomen: Prior cholecystectomy.  No acute abnormality. Musculoskeletal:  Chronic posttraumatic deformity of the left proximal humerus with likely pseudoarthrosis, partially visualized, and with unchanged multiple retained foreign bodies.There are subacute fractures of the left anterior third, fifth, and sixth ribs. There are new acute left anterolateral eighth and ninth rib fractures. IMPRESSION: New acute left anterolateral eighth and ninth rib fractures. Subacute left anterior third, fifth, and sixth rib fractures. New small bilateral pleural effusions. Similar diffuse scattered pulmonary nodules and tree-in-bud nodularity bilaterally, largest nodule measuring 8 mm in the right lower lobe, possibly representing a chronic atypical infectious process as seen on multiple prior exams. Stable size of the ascending aorta, measuring 4.0 cm. Recommend annual imaging followup by CTA or MRA. This recommendation follows 2010 ACCF/AHA/AATS/ACR/ASA/SCA/SCAI/SIR/STS/SVM Guidelines for the Diagnosis and Management of Patients with Thoracic Aortic Disease. Circulation.2010; 121: K025-K270. Aortic aneurysm NOS (ICD10-I71.9) Aortic Atherosclerosis (ICD10-I70.0). 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 Electronically Signed   By: Maurine Simmering M.D.   On: 09/10/2021 14:10        Scheduled Meds:  albuterol  2 puff Inhalation Q6H   vitamin C  500 mg Oral Daily   enoxaparin (LOVENOX) injection  30 mg Subcutaneous Q24H   megestrol  400 mg Oral Daily   sodium zirconium cyclosilicate  10 g Oral Daily   zinc sulfate  220 mg Oral Daily   Continuous Infusions:  cefTRIAXone (ROCEPHIN)  IV 1 g (09/12/21 1325)   dextrose 10 % and 0.45 % NaCl 75 mL/hr at 09/12/21 6237   remdesivir 100 mg in NS 100 mL 100 mg (09/12/21 1110)     LOS: 2 days    Time spent: 32 minutes    Sharen Hones, MD [facility]   To contact the attending provider between 7A-7P or the covering provider during after hours 7P-7A, please log into the web site [email or web address] and access using  universal [place] password for that web site. If you do not have the password, please call the hospital operator.  09/12/2021, 1:31 PM

## 2021-09-12 NOTE — Progress Notes (Signed)
SLP Cancellation Note  Patient Details Name: [name] [name] MRN: [id number] DOB: 1930-01-26   Cancelled treatment:       SLP consult received and appreciated. Chart review completed. Consulted with pt's RN while in pt's room. Per RN, pt given Ativan this AM due to restlessness/agitation; however, pt OK to "try" for SLP evaluation.   Attempted SLP evaluation. Pt sleeping upon SLP entrance to room. Pt unable to rouse long enough for safe PO intake despite max verbal/tactile cueing. SLP to defer clinical swallowing evaluation at this time. RN to contact SLP should pt's LOA improve this PM. Otherwise, SLP to continue efforts next date as appropriate.   Time attempted: 1100-1108 Quintella Baton 09/12/2021, 11:13 AM

## 2021-09-12 NOTE — Progress Notes (Signed)
I reviewed the patient's chart in epic, assessed the patient at bedside, and spoke with the patient's granddaughter Iran Ouch on the telephone.    She is unable to visit the patient due to the patient's COVID-19 status.  She was tearful over the phone.    I shared that palliative medicine is an extra layer of support and that I would like to speak with her regarding the patient's goals of care.  She shared she would like to have this conversation but feels overwhelmed today.  She also conveyed she would like her mother to be a part of this discussion.  Family conference call scheduled for tomorrow, 11/4, at 2 PM via telephone with the patient's granddaughter Iran Ouch and patient's daughter [name].  Elgin Ilsa Iha, FNP-BC Palliative Medicine Team Team Phone # [phone]  NO CHARGE

## 2021-09-12 NOTE — Progress Notes (Signed)
Patient ID: [name] [name], female   DOB: 08/28/1930, 87 y.o.   MRN: [id number]  Called grand daughter, Nicky Pugh, and informed patient would be moved to progressive care, room 241, in order to monitor blood sugar closely.  Haydee Salter, RN

## 2021-09-12 NOTE — ED Notes (Signed)
Patient remains cooperative at this time with 1:1 sitter at bedside for safety.  Soft wrist restraints not applied at this time.  Damita Dunnings, MD aware.  Will continue to hold restraints at this time and monitor patient.

## 2021-09-12 NOTE — ED Notes (Signed)
Patient resting quietly.  Respirations even and unlabored.  Soft wrist restraints have not been applied due to patient not attempting to remove medical and monitoring equipment.  Will continue to monitor patient.  MD aware.

## 2021-09-13 DIAGNOSIS — E1122 Type 2 diabetes mellitus with diabetic chronic kidney disease: Secondary | ICD-10-CM | POA: Diagnosis not present

## 2021-09-13 DIAGNOSIS — N390 Urinary tract infection, site not specified: Secondary | ICD-10-CM | POA: Diagnosis not present

## 2021-09-13 DIAGNOSIS — N184 Chronic kidney disease, stage 4 (severe): Secondary | ICD-10-CM

## 2021-09-13 DIAGNOSIS — G9341 Metabolic encephalopathy: Secondary | ICD-10-CM | POA: Diagnosis not present

## 2021-09-13 DIAGNOSIS — U071 COVID-19: Secondary | ICD-10-CM | POA: Diagnosis not present

## 2021-09-13 LAB — CBC WITH DIFFERENTIAL/PLATELET
Abs Immature Granulocytes: 0.01 10*3/uL (ref 0.00–0.07)
Basophils Absolute: 0 10*3/uL (ref 0.0–0.1)
Basophils Relative: 1 %
Eosinophils Absolute: 0.2 10*3/uL (ref 0.0–0.5)
Eosinophils Relative: 5 %
HCT: 26.9 % — ABNORMAL LOW (ref 36.0–46.0)
Hemoglobin: 7.9 g/dL — ABNORMAL LOW (ref 12.0–15.0)
Immature Granulocytes: 0 %
Lymphocytes Relative: 28 %
Lymphs Abs: 1 10*3/uL (ref 0.7–4.0)
MCH: 30.2 pg (ref 26.0–34.0)
MCHC: 29.4 g/dL — ABNORMAL LOW (ref 30.0–36.0)
MCV: 102.7 fL — ABNORMAL HIGH (ref 80.0–100.0)
Monocytes Absolute: 0.8 10*3/uL (ref 0.1–1.0)
Monocytes Relative: 22 %
Neutro Abs: 1.6 10*3/uL — ABNORMAL LOW (ref 1.7–7.7)
Neutrophils Relative %: 44 %
Platelets: 122 10*3/uL — ABNORMAL LOW (ref 150–400)
RBC: 2.62 MIL/uL — ABNORMAL LOW (ref 3.87–5.11)
RDW: 15.6 % — ABNORMAL HIGH (ref 11.5–15.5)
WBC: 3.6 10*3/uL — ABNORMAL LOW (ref 4.0–10.5)
nRBC: 0.6 % — ABNORMAL HIGH (ref 0.0–0.2)

## 2021-09-13 LAB — BASIC METABOLIC PANEL
Anion gap: 3 — ABNORMAL LOW (ref 5–15)
BUN: 34 mg/dL — ABNORMAL HIGH (ref 8–23)
CO2: 26 mmol/L (ref 22–32)
Calcium: 8.7 mg/dL — ABNORMAL LOW (ref 8.9–10.3)
Chloride: 113 mmol/L — ABNORMAL HIGH (ref 98–111)
Creatinine, Ser: 1.82 mg/dL — ABNORMAL HIGH (ref 0.44–1.00)
GFR, Estimated: 26 mL/min — ABNORMAL LOW (ref >60)
Glucose, Bld: 100 mg/dL — ABNORMAL HIGH (ref 70–99)
Potassium: 4.8 mmol/L (ref 3.5–5.1)
Sodium: 142 mmol/L (ref 135–145)

## 2021-09-13 LAB — GLUCOSE, CAPILLARY
Glucose-Capillary: 102 mg/dL — ABNORMAL HIGH (ref 70–99)
Glucose-Capillary: 108 mg/dL — ABNORMAL HIGH (ref 70–99)
Glucose-Capillary: 113 mg/dL — ABNORMAL HIGH (ref 70–99)
Glucose-Capillary: 120 mg/dL — ABNORMAL HIGH (ref 70–99)
Glucose-Capillary: 58 mg/dL — ABNORMAL LOW (ref 70–99)
Glucose-Capillary: 83 mg/dL (ref 70–99)
Glucose-Capillary: 98 mg/dL (ref 70–99)

## 2021-09-13 LAB — CORTISOL: Cortisol, Plasma: 17.8 ug/dL

## 2021-09-13 LAB — PHOSPHORUS: Phosphorus: 3.7 mg/dL (ref 2.5–4.6)

## 2021-09-13 LAB — MAGNESIUM: Magnesium: 1.6 mg/dL — ABNORMAL LOW (ref 1.7–2.4)

## 2021-09-13 LAB — C-REACTIVE PROTEIN: CRP: 1 mg/dL — ABNORMAL HIGH (ref <1.0)

## 2021-09-13 LAB — PROCALCITONIN: Procalcitonin: <0.1 ng/mL

## 2021-09-13 LAB — T4, FREE: Free T4: 0.73 ng/dL (ref 0.61–1.12)

## 2021-09-13 LAB — HOMOCYSTEINE: Homocysteine: 44.6 umol/L — ABNORMAL HIGH (ref 0.0–21.3)

## 2021-09-13 LAB — D-DIMER, QUANTITATIVE: D-Dimer, Quant: 1.46 ug/mL-FEU — ABNORMAL HIGH (ref 0.00–0.50)

## 2021-09-13 MED ORDER — LORAZEPAM 2 MG/ML IJ SOLN
0.5000 mg | INTRAMUSCULAR | Status: DC | PRN
  Administered 2021-09-13: 0.5 mg via INTRAVENOUS
  Filled 2021-09-13: qty 1

## 2021-09-13 MED ORDER — MAGNESIUM SULFATE 4 GM/100ML IV SOLN
4.0000 g | Freq: Once | INTRAVENOUS | Status: AC
  Administered 2021-09-13: 4 g via INTRAVENOUS
  Filled 2021-09-13: qty 100

## 2021-09-13 NOTE — Care Management Important Message (Signed)
Important Message  Patient Details  Name: [name] [name] MRN: [id number] Date of Birth: 1930-02-24   Medicare Important Message Given:  N/A - LOS <3 / Initial given by admissions     Dannette Barbara 09/13/2021, 8:38 AM

## 2021-09-13 NOTE — Evaluation (Addendum)
Clinical/Bedside Swallow Evaluation Patient Details  Name: [name] [name] MRN: [id number] Date of Birth: [birth year]  Today's Date: 09/13/2021 Time: SLP Start Time (ACUTE ONLY): 1100 SLP Stop Time (ACUTE ONLY): 1150 SLP Time Calculation (min) (ACUTE ONLY): 50 min  Past Medical History:  Past Medical History:  Diagnosis Date   Diabetes mellitus    Hyperlipidemia    Hypertension    Past Surgical History:  Past Surgical History:  Procedure Laterality Date   APPENDECTOMY     CHOLECYSTECTOMY     COLONOSCOPY  06/23/2012   Procedure: COLONOSCOPY;  Surgeon: Lear Ng, MD;  Location: [facility];  Service: Endoscopy;  Laterality: N/A;   ESOPHAGOGASTRODUODENOSCOPY  06/23/2012   Procedure: ESOPHAGOGASTRODUODENOSCOPY (EGD);  Surgeon: Lear Ng, MD;  Location: [facility] ENDOSCOPY;  Service: Endoscopy;  Laterality: N/A;   HPI:  Pt is a 87 y.o. female with medical history significant for Dementia, diabetes mellitus with complications of stage IV chronic kidney disease, hypertension, dyslipidemia, and Falls who presents to the emergency room from the skilled nursing facility where she resides for evaluation of change in mental status and increased somnolence.  She was found to have COVID+, chest x-ray showed a central pulmonary vessel prominence.  Chest CT: acute left anterolateral eighth and ninth rib fractures.  Subacute left anterior third, fifth, and sixth rib fractures.  New small bilateral pleural effusions.  Similar diffuse scattered pulmonary nodules and tree-in-bud  nodularity bilaterally, largest nodule measuring 8 mm in the right  lower lobe, possibly representing a chronic atypical infectious  process as seen on multiple prior exams. Pt is COVID+.  Patient was also found to have abnormal urine, placed on Rocephin for UTI.  Pt has had multiple visits to the ED in recent 3 months.    Assessment / Plan / Recommendation  Clinical Impression  Pt appears to present w/ Mild oropharyngeal  phase dysphagia in light of declined Cognitive status; Baseline Dementia. Pt is also impacted by Acute illness, COVID+. Pt is also Edentulous and requires feeding support. These issues and Cognitive decline can impact her overall awareness/timing of swallow and safety during po tasks which increases risk for aspiration, choking. Pt's risk for aspiration is present but can be reduced when following general aspiration precautions and using a modified diet consistency w/ Supervision and Feeding Assistance. She required MOD+ verbal/tactile/visual cues during po tasks.       Pt consumed several trials of ice chips, purees, thin liquids mostly via Straw w/ NO overt clinical s/s of aspiration noted; no decline in vocal quality during muttered speech; no cough, and no decline in respiratory status during/post trials. Oral phase was adequate for bolus management and oral clearing of the boluses given. No solids given at this evaluation d/t Cognitive decline, Edentulous status, and acute illness. Pt did not attempted self-feeding. OM Exam was cursory d/t poor follow-through w/ instructions but appeared [facility] w/ No unilateral weakness noted. Confusion w/ OM tasks and oral care.         D/t pt's Baseline, declined Cognitive status and Edentulous status, and her risk for aspiration secondary to oral phase deficits/awareness, recommend initiation of the dysphagia level 1(puree) w/ thin liquids; general aspiration precautions; reduce Distractions during meals and engage pt during po's at meal for self-feeding. Pills Crushed in Puree for safer swallowing. Support w/ feeding at meals. MD/NSG updated. ST services recommends follow w/ Palliative Care for [place] and education re: impact of Cognitive decline/Dementia on swallowing. ST services can follow pt at discharge for further education  and needs if needed. Largely suspect that pt's Dementia and Edentulous status could impact upgrade of diet. Precautions posted in room. SLP Visit  Diagnosis: Dysphagia, oropharyngeal phase (R13.12) (Dementia baseline)    Aspiration Risk  Mild aspiration risk;Risk for inadequate nutrition/hydration    Diet Recommendation  dysphagia level 1(puree) w/ thin liquids; general aspiration precautions; reduce Distractions during meals and engage pt during po's at meal for self-feeding. Support w/ feeding at meals; verbal/tactile/visual cues.   Medication Administration: Crushed with puree (for safer swallowing)    Other  Recommendations Recommended Consults:  (Dietician f/u; Palliative Care f/u) Oral Care Recommendations: Oral care BID;Oral care before and after PO;Staff/trained caregiver to provide oral care Other Recommendations:  (n/a)    Recommendations for follow up therapy are one component of a multi-disciplinary discharge planning process, led by the attending physician.  Recommendations may be updated based on patient status, additional functional criteria and insurance authorization.  Follow up Recommendations Skilled Nursing facility (TBD)      Frequency and Duration  (n/a)   (n/a)       Prognosis Prognosis for Safe Diet Advancement: Fair Barriers to Reach Goals: Cognitive deficits;Language deficits;Time post onset;Severity of deficits;Behavior      Swallow Study   General Date of Onset: 09/10/21 HPI: Pt is a 87 y.o. female with medical history significant for Dementia, diabetes mellitus with complications of stage IV chronic kidney disease, hypertension, dyslipidemia, and Falls who presents to the emergency room from the skilled nursing facility where she resides for evaluation of change in mental status and increased somnolence.  She was found to have COVID+, chest x-ray showed a central pulmonary vessel prominence.  Chest CT: acute left anterolateral eighth and ninth rib fractures.  Subacute left anterior third, fifth, and sixth rib fractures.  New small bilateral pleural effusions.  Similar diffuse scattered pulmonary  nodules and tree-in-bud  nodularity bilaterally, largest nodule measuring 8 mm in the right  lower lobe, possibly representing a chronic atypical infectious  process as seen on multiple prior exams. Pt is COVID+.  Patient was also found to have abnormal urine, placed on Rocephin for UTI.  Pt has had multiple visits to the ED in recent 3 months. Type of Study: Bedside Swallow Evaluation Previous Swallow Assessment: none Diet Prior to this Study: Regular;Thin liquids Temperature Spikes Noted: No (wbc 3.6) Respiratory Status: Nasal cannula (2L) History of Recent Intubation: No Behavior/Cognition: Alert;Cooperative;Pleasant mood;Confused;Distractible;Requires cueing Oral Cavity Assessment: Dry Oral Care Completed by SLP: Yes (attempted) Oral Cavity - Dentition: Edentulous Vision:  (n/a) Self-Feeding Abilities: Total assist Patient Positioning: Upright in bed (needed positioning support Upright) Baseline Vocal Quality: Low vocal intensity (muttered speech) Volitional Cough: Cognitively unable to elicit Volitional Swallow: Unable to elicit    Oral/Motor/Sensory Function Overall Oral Motor/Sensory Function: Within functional limits (during bolus management; oral movements)   Ice Chips Ice chips: Within functional limits Presentation: Spoon (fed; 3 trials)   Thin Liquid Thin Liquid: Within functional limits Presentation: Spoon;Straw (fed/supported; 3 trials via TSP, 10 trials via straw)    Nectar Thick Nectar Thick Liquid: Not tested   Honey Thick Honey Thick Liquid: Not tested   Puree Puree: Within functional limits Presentation: Spoon (fed; 15+ trials)   Solid     Solid: Not tested Other Comments: d/t Cognitive decline; acute illness        Orinda Kenner, MS, [employer] Speech Language Pathologist Rehab Services [phone] [facility] 09/13/2021,2:26 PM

## 2021-09-13 NOTE — Progress Notes (Signed)
After blood sugar obtained at 0606, RN started to reposition pt in bed, and pt suddenly very agitated and combative, hit RN and trying to kick RN and yelling/cussing at [employer]. RN stayed in room but did not try to reposition or touch pt for a while and pt started to calm down but as soon as RN went toward pt she became very agitated, hitting/kicking/cussing. Provider notified and IV ativan dose decreased to 0.5mg . Waited a while to see if pt calmed down without ativan but still restless & pulling at things, dangling legs over side rails.   Report given to day shift RN - pt still agitated so PRN ativan given; med effective and able to reposition pt in bed.

## 2021-09-13 NOTE — Progress Notes (Signed)
Patient very anxious and refusing to be touched, take meds or CBG.

## 2021-09-13 NOTE — Progress Notes (Signed)
   09/12/21 2316  Assess: MEWS Score  Temp (!) 93.7 F (34.3 C)  BP 121/84  Pulse Rate 63  Resp 16  Level of Consciousness Alert  SpO2 100 %  O2 Device Nasal Cannula  Patient Activity (if Appropriate) In bed  O2 Flow Rate (L/min) 2 L/min  Assess: MEWS Score  MEWS Temp 2  MEWS Systolic 0  MEWS Pulse 0  MEWS RR 0  MEWS LOC 0  MEWS Score 2  MEWS Score Color Yellow  Assess: if the MEWS score is Yellow or Red  Were vital signs taken at a resting state? Yes  Focused Assessment No change from prior assessment  Does the patient meet 2 or more of the SIRS criteria? No  MEWS guidelines implemented *See Row Information* Yes  Treat  MEWS Interventions Escalated (See documentation below)  Pain Scale 0-10  Pain Score 0  Take Vital Signs  Increase Vital Sign Frequency  Yellow: Q 2hr X 2 then Q 4hr X 2, if remains yellow, continue Q 4hrs  Escalate  MEWS: Escalate Yellow: discuss with charge nurse/RN and consider discussing with provider and RRT  Notify: Charge Nurse/RN  Name of Charge Nurse/RN Notified Carmelia Roller, RN  Date Charge Nurse/RN Notified 09/12/21  Notify: Provider  Provider Name/Title B. Randol Kern, NP  Date Provider Notified 09/12/21  Time Provider Notified 2353  Notification Type  (secure chat)  Notification Reason Other (Comment) (temp 93.6, yellow MEWS, HR 40-50s briefly 38)  Provider response See new orders  Date of Provider Response 09/12/21  Time of Provider Response 2353  Document  Patient Outcome Stabilized after interventions  Assess: SIRS CRITERIA  SIRS Temperature  1  SIRS Pulse 0  SIRS Respirations  0  SIRS WBC 0  SIRS Score Sum  1

## 2021-09-13 NOTE — Progress Notes (Signed)
PROGRESS NOTE    [name] [name]  YOV:[id number] DOB: February 21, 1930 DOA: 09/10/2021 PCP: Beckie Salts, MD    Brief Narrative:   [name] [name] is a 87 y.o. female with medical history significant for dementia, diabetes mellitus with complications of stage IV chronic kidney disease, hypertension and dyslipidemia who presents to the emergency room from the skilled nursing facility where she resides for evaluation of change in mental status and increased somnolence. She was found to have COVID, chest x-ray showed a central pulmonary vessel prominence.  Patient was also found to have abnormal urine, placed on Rocephin for UTI.    Assessment & Plan:   Principal Problem:   Acute metabolic encephalopathy Active Problems:   Acute lower UTI   Sepsis ([place])   HTN (hypertension)   CKD stage 4 due to type 2 diabetes mellitus (HCC)   Dementia with behavioral disturbance   COVID-19 virus infection   Hyperkalemia   Bradycardia   UTI (urinary tract infection)   Pressure injury of skin  Sepsis is secondary to UTI. Urinary tract infection. Acute metabolic encephalopathy secondary to UTI and COVID. Baseline dementia. Sinus bradycardia. Patient condition seem to be improving, patient is more awake, still has confusion at baseline.  I will complete 5 days of IV Rocephin.  Chronic kidney disease stage IV. Hyperkalemia. Renal functions better.  Potassium normalized, discontinue Lokelma.  Type 2 diabetes with hypoglycemia. Anorexia. Patient has a very poor appetite.  Started megestrol, but the patient is still refused to eat today.  Discussed with the nurse, will try to feed her.  Glucose seem to be better, reduce D10 rate.  COVID-19 infection. Pancytopenia Continue to follow.  Palliative care has scheduled a family meeting to discuss goals of care.    DVT prophylaxis: Lovenox Code Status: full Family Communication:  Disposition Plan:      Status is: Inpatient   Remains inpatient appropriate  because: Due to severity of disease, IV antibiotics, remdesivir.       I/O last 3 completed shifts: In: 4584.2 [I.V.:4004.2; IV Piggyback:580] Out: 500 [Urine:500] No intake/output data recorded.     Consultants:  None   Procedures: None   Antimicrobials: Rocephin     Subjective: More awake today, but still confused.  Discussed with the nurse, patient has been refusing to eat and cursing the nurse.  This reflects improvement of her mental status. No short of breath or cough. No abdominal pain or nausea vomiting  No dysuria hematuria.  Objective: Vitals:   09/13/21 0329 09/13/21 0612 09/13/21 0631 09/13/21 0841  BP: 99/65  140/83 132/65  Pulse:    98  Resp:   20 18  Temp:  (!) 97.5 F (36.4 C)  98 F (36.7 C)  TempSrc:  Axillary    SpO2:   100% 99%  Weight:      Height:        Intake/Output Summary (Last 24 hours) at 09/13/2021 1115 Last data filed at 09/13/2021 0500 Gross per 24 hour  Intake 1746.41 ml  Output --  Net 1746.41 ml   Filed Weights   09/10/21 1028  Weight: 85 kg    Examination:  General exam: Appears calm and comfortable  Respiratory system: Clear to auscultation. Respiratory effort normal. Cardiovascular system: S1 & S2 heard, RRR. No JVD, murmurs, rubs, gallops or clicks. No pedal edema. Gastrointestinal system: Abdomen is nondistended, soft and nontender. No organomegaly or masses felt. Normal bowel sounds heard. Central nervous system: Alert and oriented x2. No focal neurological deficits. Extremities:  Symmetric 5 x 5 power. Skin: No rashes, lesions or ulcers Psychiatry: Mood & affect appropriate.     Data Reviewed: I have personally reviewed following labs and imaging studies  CBC: Recent Labs  Lab 09/10/21 1045 09/11/21 0620 09/12/21 0639 09/13/21 0029  WBC 5.1 3.2* 5.0 3.6*  NEUTROABS  --   --  3.0 1.6*  HGB 9.0* 8.9* 8.9* 7.9*  HCT 28.8* 28.1* 29.5* 26.9*  MCV 101.1* 101.1* 101.4* 102.7*  PLT 140* 106* 138* 122*    Basic Metabolic Panel: Recent Labs  Lab 09/10/21 1045 09/10/21 1534 09/11/21 0620 09/12/21 0639 09/13/21 0029  NA 144 144 143 144 142  K 5.7* 5.1 5.1 5.3* 4.8  CL 110 113* 114* 113* 113*  CO2 28 25 26 25 26   GLUCOSE 83 58* 102* 71 100*  BUN 58* 54* 49* 40* 34*  CREATININE 2.40* 2.37* 2.13* 2.09* 1.82*  CALCIUM 9.1 9.0 8.5* 9.0 8.7*  MG  --   --   --  1.7 1.6*  PHOS  --   --   --   --  3.7   GFR: Estimated Creatinine Clearance: 22.6 mL/min (A) (by C-G formula based on SCr of 1.82 mg/dL (H)). Liver Function Tests: Recent Labs  Lab 09/10/21 1045 09/12/21 0639  AST 30 32  ALT 29 32  ALKPHOS 79 91  BILITOT 0.7 0.7  PROT 7.3 7.2  ALBUMIN 3.2* 3.1*   No results for input(s): LIPASE, AMYLASE in the last 168 hours. No results for input(s): AMMONIA in the last 168 hours. Coagulation Profile: Recent Labs  Lab 09/10/21 1105  INR 1.1   Cardiac Enzymes: No results for input(s): CKTOTAL, CKMB, CKMBINDEX, TROPONINI in the last 168 hours. BNP (last 3 results) No results for input(s): PROBNP in the last 8760 hours. HbA1C: No results for input(s): HGBA1C in the last 72 hours. CBG: Recent Labs  Lab 09/13/21 0000 09/13/21 0212 09/13/21 0424 09/13/21 0606 09/13/21 0840  GLUCAP 102* 120* 108* 98 58*   Lipid Profile: No results for input(s): CHOL, HDL, LDLCALC, TRIG, CHOLHDL, LDLDIRECT in the last 72 hours. Thyroid Function Tests: Recent Labs    09/10/21 1534 09/13/21 0029  TSH 5.296*  --   FREET4  --  0.73   Anemia Panel: Recent Labs    09/11/21 0620 09/11/21 1649  VITAMINB12  --  237  TIBC 225*  --   IRON 46  --    Sepsis Labs: Recent Labs  Lab 09/10/21 1105 09/10/21 1537 09/13/21 0029  PROCALCITON  --   --  <0.10  LATICACIDVEN 1.0 1.3  --     Recent Results (from the past 240 hour(s))  Blood Culture (routine x 2)     Status: None (Preliminary result)   Collection Time: 09/10/21 11:05 AM   Specimen: BLOOD  Result Value Ref Range Status    Specimen Description BLOOD BLOOD RIGHT FOREARM  Final   Special Requests   Final    BOTTLES DRAWN AEROBIC AND ANAEROBIC Blood Culture adequate volume   Culture   Final    NO GROWTH 3 DAYS Performed at [facility], [address], [place], [place] [address]    Report Status PENDING  Incomplete  Resp Panel by RT-PCR (Flu A&B, Covid) In/Out Cath Urine     Status: Abnormal   Collection Time: 09/10/21 11:37 AM   Specimen: In/Out Cath Urine; Nasopharyngeal(NP) swabs in vial transport medium  Result Value Ref Range Status   SARS Coronavirus 2 by RT PCR POSITIVE (  A) NEGATIVE Final    Comment: RESULT CALLED TO, READ BACK BY AND VERIFIED WITH: JENNIFER BELCHER 09/10/21 1313 KLW (NOTE) SARS-CoV-2 target nucleic acids are DETECTED.  The SARS-CoV-2 RNA is generally detectable in upper respiratory specimens during the acute phase of infection. Positive results are indicative of the presence of the identified virus, but do not rule out bacterial infection or co-infection with other pathogens not detected by the test. Clinical correlation with patient history and other diagnostic information is necessary to determine patient infection status. The expected result is Negative.  Fact Sheet for Patients: EntrepreneurPulse.com.au  Fact Sheet for Healthcare Providers: IncredibleEmployment.be  This test is not yet approved or cleared by the [place] FDA and  has been authorized for detection and/or diagnosis of SARS-CoV-2 by FDA under an Emergency Use Authorization (EUA).  This EUA will remain in effect (meaning this test can be  used) for the duration of  the COVID-19 declaration under Section 564(b)(1) of the Act, 21 U.S.C. section 360bbb-3(b)(1), unless the authorization is terminated or revoked sooner.     Influenza A by PCR NEGATIVE NEGATIVE Final   Influenza B by PCR NEGATIVE NEGATIVE Final    Comment: (NOTE) The Xpert Xpress  SARS-CoV-2/FLU/RSV plus assay is intended as an aid in the diagnosis of influenza from Nasopharyngeal swab specimens and should not be used as a sole basis for treatment. Nasal washings and aspirates are unacceptable for Xpert Xpress SARS-CoV-2/FLU/RSV testing.  Fact Sheet for Patients: EntrepreneurPulse.com.au  Fact Sheet for Healthcare Providers: IncredibleEmployment.be  This test is not yet approved or cleared by the [place] FDA and has been authorized for detection and/or diagnosis of SARS-CoV-2 by FDA under an Emergency Use Authorization (EUA). This EUA will remain in effect (meaning this test can be used) for the duration of the COVID-19 declaration under Section 564(b)(1) of the Act, 21 U.S.C. section 360bbb-3(b)(1), unless the authorization is terminated or revoked.  Performed at [facility], [address] [place], [place] [address]   Blood Culture (routine x 2)     Status: None (Preliminary result)   Collection Time: 09/10/21 11:37 AM   Specimen: BLOOD  Result Value Ref Range Status   Specimen Description BLOOD BLOOD RIGHT HAND  Final   Special Requests   Final    BOTTLES DRAWN AEROBIC AND ANAEROBIC Blood Culture results may not be optimal due to an inadequate volume of blood received in culture bottles   Culture   Final    NO GROWTH 3 DAYS Performed at [facility], [address] [place], [place] [address]    Report Status PENDING  Incomplete  Urine Culture     Status: Abnormal   Collection Time: 09/10/21  2:24 PM   Specimen: In/Out Cath Urine  Result Value Ref Range Status   Specimen Description   Final    IN/OUT CATH URINE Performed at [facility], [address], [place], [place] [address]    Special Requests   Final    NONE Performed at [facility], [address] [place],  [address]    Culture MULTIPLE SPECIES PRESENT, SUGGEST RECOLLECTION (A)  Final   Report  Status 09/11/2021 FINAL  Final         Radiology Studies: No results found.      Scheduled Meds:  albuterol  2 puff Inhalation Q6H   vitamin C  500 mg Oral Daily   megestrol  400 mg Oral Daily   zinc sulfate  220 mg  Oral Daily   Continuous Infusions:  cefTRIAXone (ROCEPHIN)  IV 1 g (09/12/21 1325)   dextrose 10 % and 0.45 % NaCl 125 mL/hr at 09/13/21 1102   remdesivir 100 mg in NS 100 mL 100 mg (09/12/21 1110)     LOS: 3 days    Time spent: 32 minutes, 50% time involved in direct patient care.    Sharen Hones, MD [facility]   To contact the attending provider between 7A-7P or the covering provider during after hours 7P-7A, please log into the web site [email or web address] and access using universal [place] password for that web site. If you do not have the password, please call the hospital operator.  09/13/2021, 11:15 AM

## 2021-09-13 NOTE — Progress Notes (Signed)
I attempted to speak with the granddaughter via telephone for our scheduled call.  I tried her 2 separate times.  I left a voicemail on my second attempt.  Awaiting callback to discuss goals of care.  Vergas Ilsa Iha, FNP-BC Palliative Medicine Team Team Phone # [phone]  NO CHARGE
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
## 2021-09-13 NOTE — Plan of Care (Addendum)
At first assessment, pt calm until RN and tech cleaned pt and changed bed linens due to purewick leaking on bed. Pt became agitated when being turned to clean and afterwards would not let RN get temperature oral or axillary.  Later pt calm, cooperative, RN attempted temp oral and axillary but unable to get reading; rectal temp done = 93.57F.  Provider notified and labs and warming blanket ordered. Bair hugger placed, temp in room increased and temp gradually increasing.    Problem: Clinical Measurements: Goal: Ability to maintain clinical measurements within normal limits will improve Outcome: Not Progressing   Problem: Nutrition: Goal: Adequate nutrition will be maintained Outcome: Not Progressing   Pt drank small amounts of water and some vanilla pudding. Frequently declines offer for food/fluids.

## 2021-09-13 NOTE — Progress Notes (Signed)
Mobility Specialist - Progress Note   09/13/21 1500  Mobility  Activity Refused mobility  Mobility performed by Mobility specialist    Pt lying in bed on arrival, "don't you put your hands on me! They trying to kill me!" Unable to redirect as pt begins quoting scriptures and praying. Unable to proceed with session at this time.    Kathee Delton Mobility Specialist 09/13/21, 3:17 PM

## 2021-09-14 DIAGNOSIS — U071 COVID-19: Secondary | ICD-10-CM | POA: Diagnosis not present

## 2021-09-14 DIAGNOSIS — G9341 Metabolic encephalopathy: Secondary | ICD-10-CM | POA: Diagnosis not present

## 2021-09-14 DIAGNOSIS — N390 Urinary tract infection, site not specified: Secondary | ICD-10-CM | POA: Diagnosis not present

## 2021-09-14 LAB — CBC WITH DIFFERENTIAL/PLATELET
Abs Immature Granulocytes: 0.01 10*3/uL (ref 0.00–0.07)
Basophils Absolute: 0 10*3/uL (ref 0.0–0.1)
Basophils Relative: 0 %
Eosinophils Absolute: 0.1 10*3/uL (ref 0.0–0.5)
Eosinophils Relative: 4 %
HCT: 28.2 % — ABNORMAL LOW (ref 36.0–46.0)
Hemoglobin: 8.5 g/dL — ABNORMAL LOW (ref 12.0–15.0)
Immature Granulocytes: 0 %
Lymphocytes Relative: 27 %
Lymphs Abs: 1.1 10*3/uL (ref 0.7–4.0)
MCH: 30.7 pg (ref 26.0–34.0)
MCHC: 30.1 g/dL (ref 30.0–36.0)
MCV: 101.8 fL — ABNORMAL HIGH (ref 80.0–100.0)
Monocytes Absolute: 0.7 10*3/uL (ref 0.1–1.0)
Monocytes Relative: 17 %
Neutro Abs: 2.1 10*3/uL (ref 1.7–7.7)
Neutrophils Relative %: 52 %
Platelets: 122 10*3/uL — ABNORMAL LOW (ref 150–400)
RBC: 2.77 MIL/uL — ABNORMAL LOW (ref 3.87–5.11)
RDW: 15.7 % — ABNORMAL HIGH (ref 11.5–15.5)
WBC: 4.1 10*3/uL (ref 4.0–10.5)
nRBC: 0 % (ref 0.0–0.2)

## 2021-09-14 LAB — MAGNESIUM: Magnesium: 1.9 mg/dL (ref 1.7–2.4)

## 2021-09-14 LAB — GLUCOSE, CAPILLARY
Glucose-Capillary: 111 mg/dL — ABNORMAL HIGH (ref 70–99)
Glucose-Capillary: 113 mg/dL — ABNORMAL HIGH (ref 70–99)
Glucose-Capillary: 58 mg/dL — ABNORMAL LOW (ref 70–99)
Glucose-Capillary: 68 mg/dL — ABNORMAL LOW (ref 70–99)
Glucose-Capillary: 70 mg/dL (ref 70–99)
Glucose-Capillary: 86 mg/dL (ref 70–99)
Glucose-Capillary: 88 mg/dL (ref 70–99)
Glucose-Capillary: 99 mg/dL (ref 70–99)

## 2021-09-14 LAB — BASIC METABOLIC PANEL
Anion gap: 6 (ref 5–15)
BUN: 31 mg/dL — ABNORMAL HIGH (ref 8–23)
CO2: 22 mmol/L (ref 22–32)
Calcium: 8.9 mg/dL (ref 8.9–10.3)
Chloride: 112 mmol/L — ABNORMAL HIGH (ref 98–111)
Creatinine, Ser: 1.67 mg/dL — ABNORMAL HIGH (ref 0.44–1.00)
GFR, Estimated: 29 mL/min — ABNORMAL LOW (ref >60)
Glucose, Bld: 94 mg/dL (ref 70–99)
Potassium: 5.3 mmol/L — ABNORMAL HIGH (ref 3.5–5.1)
Sodium: 140 mmol/L (ref 135–145)

## 2021-09-14 LAB — C-REACTIVE PROTEIN: CRP: 2.6 mg/dL — ABNORMAL HIGH (ref <1.0)

## 2021-09-14 LAB — T3: T3, Total: 71 ng/dL (ref 71–180)

## 2021-09-14 MED ORDER — PREDNISONE 20 MG PO TABS
20.0000 mg | ORAL_TABLET | Freq: Every day | ORAL | Status: DC
  Administered 2021-09-14 – 2021-09-16 (×3): 20 mg via ORAL
  Filled 2021-09-14 (×3): qty 1

## 2021-09-14 MED ORDER — SODIUM ZIRCONIUM CYCLOSILICATE 10 G PO PACK
10.0000 g | PACK | Freq: Every day | ORAL | Status: DC
  Administered 2021-09-14: 10 g via ORAL
  Filled 2021-09-14 (×2): qty 1

## 2021-09-14 NOTE — Progress Notes (Signed)
Hypoglycemic Event  CBG: 68  Treatment: 4 oz juice/soda  Symptoms: None  Follow-up CBG: Time:1323 CBG Result:70  Possible Reasons for Event: Inadequate meal intake  Comments/MD notified encourage po intake. Ivf infusing    [name] [name]

## 2021-09-14 NOTE — Progress Notes (Signed)
PROGRESS NOTE    [name] [name]  TDD:[id number] DOB: 10-15-1930 DOA: 09/10/2021 PCP: Beckie Salts, MD    Brief Narrative:  [name] [name] is a 87 y.o. female with medical history significant for dementia, diabetes mellitus with complications of stage IV chronic kidney disease, hypertension and dyslipidemia who presents to the emergency room from the skilled nursing facility where she resides for evaluation of change in mental status and increased somnolence. She was found to have COVID, chest x-ray showed a central pulmonary vessel prominence.  Patient was also found to have abnormal urine, placed on Rocephin for UTI.   Assessment & Plan:   Principal Problem:   Acute metabolic encephalopathy Active Problems:   Acute lower UTI   Sepsis ([place])   HTN (hypertension)   CKD stage 4 due to type 2 diabetes mellitus (HCC)   Dementia with behavioral disturbance   COVID-19 virus infection   Hyperkalemia   Bradycardia   UTI (urinary tract infection)   Pressure injury of skin  Sepsis is secondary to UTI. Urinary tract infection. Acute metabolic encephalopathy secondary to UTI and COVID. Baseline dementia. Sinus bradycardia. Condition improving, mental status has improved.  Will finish 5 days antibiotics.  Finishing a course of remdesivir.   Type 2 diabetes with hypoglycemia. Anorexia. Patient is started eating, but still has significant hypoglycemia even on D10.  Currently on 75 mL/h. Continue nutritional supplements, megestrol. I will also add 20 mg of prednisone to improve the glucose.  Chronic kidney disease stage IV. Hyperkalemia. Function stable, potassium went up again.  Will reschedule Lokelma.  Pancytopenia. COVID infection. Continue to follow.  This appears to be secondary to viral infection with COVID.   DVT prophylaxis: Lovenox Code Status: full Family Communication:  Disposition Plan:      Status is: Inpatient   Remains inpatient appropriate because: Due to severity  of disease, IV fluids        I/O last 3 completed shifts: In: 2081.5 [P.O.:476; I.V.:1505.5; IV Piggyback:100] Out: 700 [Urine:700] Total I/O In: 150 [P.O.:150] Out: -     Consultants:  None   Procedures: None   Antimicrobials: Rocephin   Subjective: Patient mental status is improved.  She started eating, but appetite is still poor.  Still developed hypoglycemia even on D10 infusion. No abdominal pain nausea vomiting  No dysuria hematuria P No fever or chills.  Objective: Vitals:   09/14/21 0553 09/14/21 0600 09/14/21 0911 09/14/21 1310  BP: (!) 167/86  (!) 152/104 (!) 146/70  Pulse: 74  73 65  Resp: 20  17 18   Temp: 97.7 F (36.5 C)  98.1 F (36.7 C) 98.4 F (36.9 C)  TempSrc: Axillary     SpO2: 100%  100% 100%  Weight:  89.9 kg    Height:        Intake/Output Summary (Last 24 hours) at 09/14/2021 1326 Last data filed at 09/14/2021 6237 Gross per 24 hour  Intake 836 ml  Output --  Net 836 ml   Filed Weights   09/10/21 1028 09/14/21 0600  Weight: 85 kg 89.9 kg    Examination:  General exam: Appears calm and comfortable  Respiratory system: Clear to auscultation. Respiratory effort normal. Cardiovascular system: S1 & S2 heard, RRR. No JVD, murmurs, rubs, gallops or clicks. No pedal edema. Gastrointestinal system: Abdomen is nondistended, soft and nontender. No organomegaly or masses felt. Normal bowel sounds heard. Central nervous system: Alert and oriented x2. No focal neurological deficits. Extremities: Symmetric 5 x 5 power. Skin: No rashes, lesions  or ulcers Psychiatry: Mood & affect appropriate.     Data Reviewed: I have personally reviewed following labs and imaging studies  CBC: Recent Labs  Lab 09/10/21 1045 09/11/21 0620 09/12/21 0639 09/13/21 0029 09/14/21 1025  WBC 5.1 3.2* 5.0 3.6* 4.1  NEUTROABS  --   --  3.0 1.6* 2.1  HGB 9.0* 8.9* 8.9* 7.9* 8.5*  HCT 28.8* 28.1* 29.5* 26.9* 28.2*  MCV 101.1* 101.1* 101.4* 102.7* 101.8*   PLT 140* 106* 138* 122* 220*   Basic Metabolic Panel: Recent Labs  Lab 09/10/21 1534 09/11/21 0620 09/12/21 0639 09/13/21 0029 09/14/21 1025  NA 144 143 144 142 140  K 5.1 5.1 5.3* 4.8 5.3*  CL 113* 114* 113* 113* 112*  CO2 25 26 25 26 22   GLUCOSE 58* 102* 71 100* 94  BUN 54* 49* 40* 34* 31*  CREATININE 2.37* 2.13* 2.09* 1.82* 1.67*  CALCIUM 9.0 8.5* 9.0 8.7* 8.9  MG  --   --  1.7 1.6* 1.9  PHOS  --   --   --  3.7  --    GFR: Estimated Creatinine Clearance: 25.3 mL/min (A) (by C-G formula based on SCr of 1.67 mg/dL (H)). Liver Function Tests: Recent Labs  Lab 09/10/21 1045 09/12/21 0639  AST 30 32  ALT 29 32  ALKPHOS 79 91  BILITOT 0.7 0.7  PROT 7.3 7.2  ALBUMIN 3.2* 3.1*   No results for input(s): LIPASE, AMYLASE in the last 168 hours. No results for input(s): AMMONIA in the last 168 hours. Coagulation Profile: Recent Labs  Lab 09/10/21 1105  INR 1.1   Cardiac Enzymes: No results for input(s): CKTOTAL, CKMB, CKMBINDEX, TROPONINI in the last 168 hours. BNP (last 3 results) No results for input(s): PROBNP in the last 8760 hours. HbA1C: No results for input(s): HGBA1C in the last 72 hours. CBG: Recent Labs  Lab 09/14/21 0543 09/14/21 0905 09/14/21 1305 09/14/21 1317 09/14/21 1323  GLUCAP 88 86 58* 68* 70   Lipid Profile: No results for input(s): CHOL, HDL, LDLCALC, TRIG, CHOLHDL, LDLDIRECT in the last 72 hours. Thyroid Function Tests: Recent Labs    09/13/21 0029  FREET4 0.73   Anemia Panel: Recent Labs    09/11/21 1649  VITAMINB12 237   Sepsis Labs: Recent Labs  Lab 09/10/21 1105 09/10/21 1537 09/13/21 0029  PROCALCITON  --   --  <0.10  LATICACIDVEN 1.0 1.3  --     Recent Results (from the past 240 hour(s))  Blood Culture (routine x 2)     Status: None (Preliminary result)   Collection Time: 09/10/21 11:05 AM   Specimen: BLOOD  Result Value Ref Range Status   Specimen Description BLOOD BLOOD RIGHT FOREARM  Final   Special  Requests   Final    BOTTLES DRAWN AEROBIC AND ANAEROBIC Blood Culture adequate volume   Culture   Final    NO GROWTH 4 DAYS Performed at [facility], [address], [place], [place] [address]    Report Status PENDING  Incomplete  Resp Panel by RT-PCR (Flu A&B, Covid) In/Out Cath Urine     Status: Abnormal   Collection Time: 09/10/21 11:37 AM   Specimen: In/Out Cath Urine; Nasopharyngeal(NP) swabs in vial transport medium  Result Value Ref Range Status   SARS Coronavirus 2 by RT PCR POSITIVE (A) NEGATIVE Final    Comment: RESULT CALLED TO, READ BACK BY AND VERIFIED WITH: JENNIFER BELCHER 09/10/21 1313 KLW (NOTE) SARS-CoV-2 target nucleic acids are DETECTED.  The SARS-CoV-2  RNA is generally detectable in upper respiratory specimens during the acute phase of infection. Positive results are indicative of the presence of the identified virus, but do not rule out bacterial infection or co-infection with other pathogens not detected by the test. Clinical correlation with patient history and other diagnostic information is necessary to determine patient infection status. The expected result is Negative.  Fact Sheet for Patients: EntrepreneurPulse.com.au  Fact Sheet for Healthcare Providers: IncredibleEmployment.be  This test is not yet approved or cleared by the [place] FDA and  has been authorized for detection and/or diagnosis of SARS-CoV-2 by FDA under an Emergency Use Authorization (EUA).  This EUA will remain in effect (meaning this test can be  used) for the duration of  the COVID-19 declaration under Section 564(b)(1) of the Act, 21 U.S.C. section 360bbb-3(b)(1), unless the authorization is terminated or revoked sooner.     Influenza A by PCR NEGATIVE NEGATIVE Final   Influenza B by PCR NEGATIVE NEGATIVE Final    Comment: (NOTE) The Xpert Xpress SARS-CoV-2/FLU/RSV plus assay is intended as an aid in the diagnosis of  influenza from Nasopharyngeal swab specimens and should not be used as a sole basis for treatment. Nasal washings and aspirates are unacceptable for Xpert Xpress SARS-CoV-2/FLU/RSV testing.  Fact Sheet for Patients: EntrepreneurPulse.com.au  Fact Sheet for Healthcare Providers: IncredibleEmployment.be  This test is not yet approved or cleared by the [place] FDA and has been authorized for detection and/or diagnosis of SARS-CoV-2 by FDA under an Emergency Use Authorization (EUA). This EUA will remain in effect (meaning this test can be used) for the duration of the COVID-19 declaration under Section 564(b)(1) of the Act, 21 U.S.C. section 360bbb-3(b)(1), unless the authorization is terminated or revoked.  Performed at [facility], [address] [place], [place] [address]   Blood Culture (routine x 2)     Status: None (Preliminary result)   Collection Time: 09/10/21 11:37 AM   Specimen: BLOOD  Result Value Ref Range Status   Specimen Description BLOOD BLOOD RIGHT HAND  Final   Special Requests   Final    BOTTLES DRAWN AEROBIC AND ANAEROBIC Blood Culture results may not be optimal due to an inadequate volume of blood received in culture bottles   Culture   Final    NO GROWTH 4 DAYS Performed at [facility], [address] [place], [place] [address]    Report Status PENDING  Incomplete  Urine Culture     Status: Abnormal   Collection Time: 09/10/21  2:24 PM   Specimen: In/Out Cath Urine  Result Value Ref Range Status   Specimen Description   Final    IN/OUT CATH URINE Performed at [facility], [address] [place], [place] [address]    Special Requests   Final    NONE Performed at [facility], [address], [place], [place] [address]    Culture MULTIPLE SPECIES PRESENT, SUGGEST RECOLLECTION (A)  Final   Report Status 09/11/2021 FINAL  Final         Radiology Studies: No  results found.      Scheduled Meds:  albuterol  2 puff Inhalation Q6H   vitamin C  500 mg Oral Daily   megestrol  400 mg Oral Daily   predniSONE  20 mg Oral Q breakfast   zinc sulfate  220 mg Oral Daily   Continuous Infusions:  cefTRIAXone (ROCEPHIN)  IV 1 g (09/14/21 1100)   dextrose 10 % and 0.45 %  NaCl 75 mL/hr at 09/14/21 1058     LOS: 4 days    Time spent: 28 minutes    Sharen Hones, MD [facility]   To contact the attending provider between 7A-7P or the covering provider during after hours 7P-7A, please log into the web site [email or web address] and access using universal [place] password for that web site. If you do not have the password, please call the hospital operator.  09/14/2021, 1:26 PM

## 2021-09-14 NOTE — Progress Notes (Signed)
Hypoglycemic Event  CBG: 58  Treatment: 8 oz juice/soda  Symptoms: None  Follow-up CBG: Time:1317 CBG Result:68   Possible Reasons for Event: Inadequate meal intake  Comments/MD notified:restarted D10 1/2ns at [address]

## 2021-09-15 DIAGNOSIS — N390 Urinary tract infection, site not specified: Secondary | ICD-10-CM | POA: Diagnosis not present

## 2021-09-15 DIAGNOSIS — G9341 Metabolic encephalopathy: Secondary | ICD-10-CM | POA: Diagnosis not present

## 2021-09-15 DIAGNOSIS — F03918 Unspecified dementia, unspecified severity, with other behavioral disturbance: Secondary | ICD-10-CM | POA: Diagnosis not present

## 2021-09-15 DIAGNOSIS — U071 COVID-19: Secondary | ICD-10-CM | POA: Diagnosis not present

## 2021-09-15 LAB — BASIC METABOLIC PANEL
Anion gap: 4 — ABNORMAL LOW (ref 5–15)
BUN: 30 mg/dL — ABNORMAL HIGH (ref 8–23)
CO2: 24 mmol/L (ref 22–32)
Calcium: 9.3 mg/dL (ref 8.9–10.3)
Chloride: 111 mmol/L (ref 98–111)
Creatinine, Ser: 1.44 mg/dL — ABNORMAL HIGH (ref 0.44–1.00)
GFR, Estimated: 35 mL/min — ABNORMAL LOW (ref >60)
Glucose, Bld: 104 mg/dL — ABNORMAL HIGH (ref 70–99)
Potassium: 5.6 mmol/L — ABNORMAL HIGH (ref 3.5–5.1)
Sodium: 139 mmol/L (ref 135–145)

## 2021-09-15 LAB — CULTURE, BLOOD (ROUTINE X 2)
Culture: NO GROWTH
Culture: NO GROWTH
Special Requests: ADEQUATE

## 2021-09-15 LAB — CBC WITH DIFFERENTIAL/PLATELET
Abs Immature Granulocytes: 0.01 10*3/uL (ref 0.00–0.07)
Basophils Absolute: 0 10*3/uL (ref 0.0–0.1)
Basophils Relative: 0 %
Eosinophils Absolute: 0 10*3/uL (ref 0.0–0.5)
Eosinophils Relative: 0 %
HCT: 29.7 % — ABNORMAL LOW (ref 36.0–46.0)
Hemoglobin: 9.2 g/dL — ABNORMAL LOW (ref 12.0–15.0)
Immature Granulocytes: 0 %
Lymphocytes Relative: 23 %
Lymphs Abs: 1.1 10*3/uL (ref 0.7–4.0)
MCH: 31.1 pg (ref 26.0–34.0)
MCHC: 31 g/dL (ref 30.0–36.0)
MCV: 100.3 fL — ABNORMAL HIGH (ref 80.0–100.0)
Monocytes Absolute: 0.6 10*3/uL (ref 0.1–1.0)
Monocytes Relative: 13 %
Neutro Abs: 3.1 10*3/uL (ref 1.7–7.7)
Neutrophils Relative %: 64 %
Platelets: 136 10*3/uL — ABNORMAL LOW (ref 150–400)
RBC: 2.96 MIL/uL — ABNORMAL LOW (ref 3.87–5.11)
RDW: 15.4 % (ref 11.5–15.5)
WBC: 4.9 10*3/uL (ref 4.0–10.5)
nRBC: 0 % (ref 0.0–0.2)

## 2021-09-15 LAB — GLUCOSE, CAPILLARY
Glucose-Capillary: 111 mg/dL — ABNORMAL HIGH (ref 70–99)
Glucose-Capillary: 111 mg/dL — ABNORMAL HIGH (ref 70–99)
Glucose-Capillary: 133 mg/dL — ABNORMAL HIGH (ref 70–99)
Glucose-Capillary: 134 mg/dL — ABNORMAL HIGH (ref 70–99)
Glucose-Capillary: 241 mg/dL — ABNORMAL HIGH (ref 70–99)
Glucose-Capillary: 82 mg/dL (ref 70–99)

## 2021-09-15 LAB — C-REACTIVE PROTEIN: CRP: 3 mg/dL — ABNORMAL HIGH (ref <1.0)

## 2021-09-15 LAB — MAGNESIUM: Magnesium: 2 mg/dL (ref 1.7–2.4)

## 2021-09-15 MED ORDER — FUROSEMIDE 10 MG/ML IJ SOLN
40.0000 mg | Freq: Once | INTRAMUSCULAR | Status: AC
  Administered 2021-09-15: 40 mg via INTRAVENOUS
  Filled 2021-09-15: qty 4

## 2021-09-15 MED ORDER — CALCIUM GLUCONATE-NACL 1-0.675 GM/50ML-% IV SOLN
1.0000 g | Freq: Once | INTRAVENOUS | Status: AC
  Administered 2021-09-15: 1000 mg via INTRAVENOUS
  Filled 2021-09-15 (×2): qty 50

## 2021-09-15 MED ORDER — SODIUM ZIRCONIUM CYCLOSILICATE 10 G PO PACK
10.0000 g | PACK | Freq: Two times a day (BID) | ORAL | Status: DC
  Administered 2021-09-15 – 2021-09-16 (×2): 10 g via ORAL
  Filled 2021-09-15 (×3): qty 1

## 2021-09-15 MED ORDER — HALOPERIDOL LACTATE 5 MG/ML IJ SOLN
1.0000 mg | Freq: Four times a day (QID) | INTRAMUSCULAR | Status: DC | PRN
  Administered 2021-09-16 – 2021-09-19 (×7): 1 mg via INTRAVENOUS
  Filled 2021-09-15 (×7): qty 1

## 2021-09-15 NOTE — Progress Notes (Addendum)
PROGRESS NOTE    [name]  [id number]:[id number] DOB: 09/21/1930 DOA: 09/10/2021 PCP: Beckie Salts, MD    Brief Narrative:  [name] [name] is a 87 y.o. female with medical history significant for dementia, diabetes mellitus with complications of stage IV chronic kidney disease, hypertension and dyslipidemia who presents to the emergency room from the skilled nursing facility where she resides for evaluation of change in mental status and increased somnolence. She was found to have COVID, chest x-ray showed a central pulmonary vessel prominence.  Patient was also found to have abnormal urine, placed on Rocephin for UTI.   Assessment & Plan:   Principal Problem:   Acute metabolic encephalopathy Active Problems:   Acute lower UTI   Sepsis ([place])   HTN (hypertension)   CKD stage 4 due to type 2 diabetes mellitus (HCC)   Dementia with behavioral disturbance   COVID-19 virus infection   Hyperkalemia   Bradycardia   UTI (urinary tract infection)   Pressure injury of skin  Sepsis is secondary to UTI. Urinary tract infection. Acute metabolic encephalopathy secondary to UTI and COVID. Sinus bradycardia. Condition had improved, still on Rocephin.  Will finish 5 days course.  Dementia with delirium. Patient had more agitation today.  We will give as needed Haldol at a lower dose of 1 mg every 6 hours as needed. I reviewed patient EKG performed 09/10/2021, QTC not prolonged.  I am aware that patient potassium was elevated, but currently we do not have good option for agitation.  I will try to avoid benzodiazepine as it can cause worsening delirium.  Type 2 diabetes with hypoglycemia. Anorexia. Added prednisone, megestrol.  Patient glucose appears to be better today.  I will decrease dose of D10.  Continue to monitor. I will discontinue fluids as soon as glucose is sustained.   Chronic kidney disease stage IV. Hyperkalemia. Renal function getting better, potassium 5.6 today.  I will increase  Lokelma to 10 mg twice a day.  Also given calcium IV. No insulin due to severe hypoglycemia.   Pancytopenia. COVID infection. Continue to follow.  Pancytopenia seems to be improving.  Patient refused to take [facility], will give 40mg  iv Lasix to reduce the potassium level.  DVT prophylaxis: Lovenox Code Status: full Family Communication: Updated granddaughter on the phone, patient most likely will go home in 2 to 3 days. Disposition Plan:      Status is: Inpatient   Remains inpatient appropriate because: Due to severity of disease, IV fluids        I/O last 3 completed shifts: In: 2550.1 [P.O.:706; I.V.:1644.1; IV Piggyback:200] Out: 1950 [YJEHU:3149] Total I/O In: -  Out: 600 [Urine:600]     Consultants:  none  Procedures: none  Antimicrobials: rocephin  Subjective: Patient had more confusion today, she is agitated today.  Per granddaughter, patient had intermittent confusion and agitation at home. She does not have any short of breath, she does not need oxygen. When I walk in the room, patient was feeding herself with her hand.  Looks like that she is eating. No abdominal pain or nausea vomiting. No short of breath or cough. No fever or chills.  Objective: Vitals:   09/14/21 2051 09/15/21 0022 09/15/21 0437 09/15/21 0817  BP: (!) 168/96 (!) 142/83 (!) 161/88 (!) 168/95  Pulse: 80 68 81 95  Resp: 20 (!) 21 17 20   Temp: 97.6 F (36.4 C) 98.9 F (37.2 C) 98.9 F (37.2 C) 97.6 F (36.4 C)  TempSrc: Oral  SpO2:  100% 100% 99%  Weight:      Height:        Intake/Output Summary (Last 24 hours) at 09/15/2021 1112 Last data filed at 09/15/2021 0900 Gross per 24 hour  Intake 2164.12 ml  Output 2550 ml  Net -385.88 ml   Filed Weights   09/10/21 1028 09/14/21 0600  Weight: 85 kg 89.9 kg    Examination:  General exam: Appears calm and comfortable  Respiratory system: Clear to auscultation. Respiratory effort normal. Cardiovascular system: S1 & S2  heard, RRR. No JVD, murmurs, rubs, gallops or clicks. No pedal edema. Gastrointestinal system: Abdomen is nondistended, soft and nontender. No organomegaly or masses felt. Normal bowel sounds heard. Central nervous system: Alert and oriented x1. No focal neurological deficits. Extremities: Symmetric 5 x 5 power. Skin: No rashes, lesions or ulcers    Data Reviewed: I have personally reviewed following labs and imaging studies  CBC: Recent Labs  Lab 09/11/21 0620 09/12/21 0639 09/13/21 0029 09/14/21 1025 09/15/21 0615  WBC 3.2* 5.0 3.6* 4.1 4.9  NEUTROABS  --  3.0 1.6* 2.1 3.1  HGB 8.9* 8.9* 7.9* 8.5* 9.2*  HCT 28.1* 29.5* 26.9* 28.2* 29.7*  MCV 101.1* 101.4* 102.7* 101.8* 100.3*  PLT 106* 138* 122* 122* 213*   Basic Metabolic Panel: Recent Labs  Lab 09/11/21 0620 09/12/21 0639 09/13/21 0029 09/14/21 1025 09/15/21 0615  NA 143 144 142 140 139  K 5.1 5.3* 4.8 5.3* 5.6*  CL 114* 113* 113* 112* 111  CO2 26 25 26 22 24   GLUCOSE 102* 71 100* 94 104*  BUN 49* 40* 34* 31* 30*  CREATININE 2.13* 2.09* 1.82* 1.67* 1.44*  CALCIUM 8.5* 9.0 8.7* 8.9 9.3  MG  --  1.7 1.6* 1.9 2.0  PHOS  --   --  3.7  --   --    GFR: Estimated Creatinine Clearance: 29.4 mL/min (A) (by C-G formula based on SCr of 1.44 mg/dL (H)). Liver Function Tests: Recent Labs  Lab 09/10/21 1045 09/12/21 0639  AST 30 32  ALT 29 32  ALKPHOS 79 91  BILITOT 0.7 0.7  PROT 7.3 7.2  ALBUMIN 3.2* 3.1*   No results for input(s): LIPASE, AMYLASE in the last 168 hours. No results for input(s): AMMONIA in the last 168 hours. Coagulation Profile: Recent Labs  Lab 09/10/21 1105  INR 1.1   Cardiac Enzymes: No results for input(s): CKTOTAL, CKMB, CKMBINDEX, TROPONINI in the last 168 hours. BNP (last 3 results) No results for input(s): PROBNP in the last 8760 hours. HbA1C: No results for input(s): HGBA1C in the last 72 hours. CBG: Recent Labs  Lab 09/14/21 1740 09/14/21 2033 09/15/21 0025 09/15/21 0436  09/15/21 0818  GLUCAP 111* 113* 134* 111* 111*   Lipid Profile: No results for input(s): CHOL, HDL, LDLCALC, TRIG, CHOLHDL, LDLDIRECT in the last 72 hours. Thyroid Function Tests: Recent Labs    09/13/21 0029  FREET4 0.73   Anemia Panel: No results for input(s): VITAMINB12, FOLATE, FERRITIN, TIBC, IRON, RETICCTPCT in the last 72 hours. Sepsis Labs: Recent Labs  Lab 09/10/21 1105 09/10/21 1537 09/13/21 0029  PROCALCITON  --   --  <0.10  LATICACIDVEN 1.0 1.3  --     Recent Results (from the past 240 hour(s))  Blood Culture (routine x 2)     Status: None   Collection Time: 09/10/21 11:05 AM   Specimen: BLOOD  Result Value Ref Range Status   Specimen Description BLOOD BLOOD RIGHT FOREARM  Final  Special Requests   Final    BOTTLES DRAWN AEROBIC AND ANAEROBIC Blood Culture adequate volume   Culture   Final    NO GROWTH 5 DAYS Performed at [facility], [address] [place], [place] [address]    Report Status 09/15/2021 FINAL  Final  Resp Panel by RT-PCR (Flu A&B, Covid) In/Out Cath Urine     Status: Abnormal   Collection Time: 09/10/21 11:37 AM   Specimen: In/Out Cath Urine; Nasopharyngeal(NP) swabs in vial transport medium  Result Value Ref Range Status   SARS Coronavirus 2 by RT PCR POSITIVE (A) NEGATIVE Final    Comment: RESULT CALLED TO, READ BACK BY AND VERIFIED WITH: JENNIFER BELCHER 09/10/21 1313 KLW (NOTE) SARS-CoV-2 target nucleic acids are DETECTED.  The SARS-CoV-2 RNA is generally detectable in upper respiratory specimens during the acute phase of infection. Positive results are indicative of the presence of the identified virus, but do not rule out bacterial infection or co-infection with other pathogens not detected by the test. Clinical correlation with patient history and other diagnostic information is necessary to determine patient infection status. The expected result is Negative.  Fact Sheet for  Patients: EntrepreneurPulse.com.au  Fact Sheet for Healthcare Providers: IncredibleEmployment.be  This test is not yet approved or cleared by the [place] FDA and  has been authorized for detection and/or diagnosis of SARS-CoV-2 by FDA under an Emergency Use Authorization (EUA).  This EUA will remain in effect (meaning this test can be  used) for the duration of  the COVID-19 declaration under Section 564(b)(1) of the Act, 21 U.S.C. section 360bbb-3(b)(1), unless the authorization is terminated or revoked sooner.     Influenza A by PCR NEGATIVE NEGATIVE Final   Influenza B by PCR NEGATIVE NEGATIVE Final    Comment: (NOTE) The Xpert Xpress SARS-CoV-2/FLU/RSV plus assay is intended as an aid in the diagnosis of influenza from Nasopharyngeal swab specimens and should not be used as a sole basis for treatment. Nasal washings and aspirates are unacceptable for Xpert Xpress SARS-CoV-2/FLU/RSV testing.  Fact Sheet for Patients: EntrepreneurPulse.com.au  Fact Sheet for Healthcare Providers: IncredibleEmployment.be  This test is not yet approved or cleared by the [place] FDA and has been authorized for detection and/or diagnosis of SARS-CoV-2 by FDA under an Emergency Use Authorization (EUA). This EUA will remain in effect (meaning this test can be used) for the duration of the COVID-19 declaration under Section 564(b)(1) of the Act, 21 U.S.C. section 360bbb-3(b)(1), unless the authorization is terminated or revoked.  Performed at [facility], [address] [place], [place] [address]   Blood Culture (routine x 2)     Status: None   Collection Time: 09/10/21 11:37 AM   Specimen: BLOOD  Result Value Ref Range Status   Specimen Description BLOOD BLOOD RIGHT HAND  Final   Special Requests   Final    BOTTLES DRAWN AEROBIC AND ANAEROBIC Blood Culture results may not be optimal due to an  inadequate volume of blood received in culture bottles   Culture   Final    NO GROWTH 5 DAYS Performed at [facility], [address], [place], [place] [address]    Report Status 09/15/2021 FINAL  Final  Urine Culture     Status: Abnormal   Collection Time: 09/10/21  2:24 PM   Specimen: In/Out Cath Urine  Result Value Ref Range Status   Specimen Description   Final    IN/OUT CATH URINE Performed at [facility], [address] [place], [place] [address]    Special Requests   Final    NONE Performed at [facility], [address] [place], [place] [address]    Culture MULTIPLE SPECIES PRESENT, SUGGEST RECOLLECTION (A)  Final   Report Status 09/11/2021 FINAL  Final         Radiology Studies: No results found.      Scheduled Meds:  albuterol  2 puff Inhalation Q6H   vitamin C  500 mg Oral Daily   megestrol  400 mg Oral Daily   predniSONE  20 mg Oral Q breakfast   sodium zirconium cyclosilicate  10 g Oral BID   zinc sulfate  220 mg Oral Daily   Continuous Infusions:  calcium gluconate     cefTRIAXone (ROCEPHIN)  IV Stopped (09/14/21 1130)   dextrose 10 % and 0.45 % NaCl 50 mL/hr at 09/15/21 0858     LOS: 5 days    Time spent: 32 minutes    Sharen Hones, MD [facility]   To contact the attending provider between 7A-7P or the covering provider during after hours 7P-7A, please log into the web site [email or web address] and access using universal [place] password for that web site. If you do not have the password, please call the hospital operator.  09/15/2021, 11:12 AM

## 2021-09-16 DIAGNOSIS — E1122 Type 2 diabetes mellitus with diabetic chronic kidney disease: Secondary | ICD-10-CM | POA: Diagnosis not present

## 2021-09-16 DIAGNOSIS — N390 Urinary tract infection, site not specified: Secondary | ICD-10-CM | POA: Diagnosis not present

## 2021-09-16 DIAGNOSIS — U071 COVID-19: Secondary | ICD-10-CM | POA: Diagnosis not present

## 2021-09-16 DIAGNOSIS — G9341 Metabolic encephalopathy: Secondary | ICD-10-CM | POA: Diagnosis not present

## 2021-09-16 LAB — BASIC METABOLIC PANEL
Anion gap: 5 (ref 5–15)
BUN: 33 mg/dL — ABNORMAL HIGH (ref 8–23)
CO2: 27 mmol/L (ref 22–32)
Calcium: 9.4 mg/dL (ref 8.9–10.3)
Chloride: 108 mmol/L (ref 98–111)
Creatinine, Ser: 1.53 mg/dL — ABNORMAL HIGH (ref 0.44–1.00)
GFR, Estimated: 32 mL/min — ABNORMAL LOW (ref >60)
Glucose, Bld: 78 mg/dL (ref 70–99)
Potassium: 5.1 mmol/L (ref 3.5–5.1)
Sodium: 140 mmol/L (ref 135–145)

## 2021-09-16 LAB — GLUCOSE, CAPILLARY
Glucose-Capillary: 118 mg/dL — ABNORMAL HIGH (ref 70–99)
Glucose-Capillary: 121 mg/dL — ABNORMAL HIGH (ref 70–99)
Glucose-Capillary: 192 mg/dL — ABNORMAL HIGH (ref 70–99)
Glucose-Capillary: 65 mg/dL — ABNORMAL LOW (ref 70–99)
Glucose-Capillary: 86 mg/dL (ref 70–99)
Glucose-Capillary: 96 mg/dL (ref 70–99)

## 2021-09-16 MED ORDER — PREDNISONE 20 MG PO TABS
20.0000 mg | ORAL_TABLET | Freq: Two times a day (BID) | ORAL | Status: DC
  Administered 2021-09-16 – 2021-09-19 (×6): 20 mg via ORAL
  Filled 2021-09-16 (×6): qty 1

## 2021-09-16 MED ORDER — SODIUM ZIRCONIUM CYCLOSILICATE 10 G PO PACK
10.0000 g | PACK | Freq: Every day | ORAL | Status: DC
  Administered 2021-09-17 – 2021-09-18 (×2): 10 g via ORAL
  Filled 2021-09-16 (×2): qty 1

## 2021-09-16 NOTE — Progress Notes (Signed)
Upon transfer to 1C MEWS score 2 d/t HR 114.  Rest of VSS.  Per previous documentation HR was up to 120 during the evening.  Patient denies pain.  She is confused.  Tele-sitter continues.  Will continue to monitor per yellow MEWS protocol.

## 2021-09-16 NOTE — Progress Notes (Signed)
PROGRESS NOTE    [name] [name]  NKN:[id number] DOB: [birth year] DOA: 09/10/2021 PCP: Beckie Salts, MD    Brief Narrative:  [name] [name] is a 87 y.o. female with medical history significant for dementia, diabetes mellitus with complications of stage IV chronic kidney disease, hypertension and dyslipidemia who presents to the emergency room from the skilled nursing facility where she resides for evaluation of change in mental status and increased somnolence. She was found to have COVID, chest x-ray showed a central pulmonary vessel prominence.  Patient was also found to have abnormal urine, placed on Rocephin for UTI.   Assessment & Plan:   Principal Problem:   Acute metabolic encephalopathy Active Problems:   Acute lower UTI   Sepsis ([place])   HTN (hypertension)   CKD stage 4 due to type 2 diabetes mellitus (HCC)   Dementia with behavioral disturbance   COVID-19 virus infection   Hyperkalemia   Bradycardia   UTI (urinary tract infection)   Pressure injury of skin  Type 2 diabetes with hypoglycemia. Anorexia. Patient currently treated with megestrol, prednisone, D10.  Patient still has very poor appetite, glucose still running low. Patient has a severe anorexia, medications not working.  I have discussed with patient and granddaughter for possible feeding tube, but family refused it. At this point, patient condition is terminal.  Palliative care consult has been obtained, will consider possibility of transitioning to comfort care.  Sepsis is secondary to UTI. Urinary tract infection. Acute metabolic encephalopathy secondary to UTI and COVID. Sinus bradycardia. Conditions are improved.   Chronic kidney disease stage IV. Hyperkalemia. Potassium level is better.  Renal function stable.    Pancytopenia. COVID infection. Continue to follow.  Pancytopenia seems to be improving.   DVT prophylaxis: Lovenox Code Status: full Family Communication: Updated granddaughter on the phone,  patient most likely will go home in 2 to 3 days. Disposition Plan:      Status is: Inpatient   Remains inpatient appropriate because: Due to severity of disease, IV fluids        I/O last 3 completed shifts: In: 1024 [P.O.:356; I.V.:521.2; Other:1; IV Piggyback:145.7] Out: 4800 [Urine:4800] No intake/output data recorded.     Subjective: Is confused as at baseline.  She has a very poor appetite, very little p.o. intake.  Not able to maintain glucose level. Still on oxygen, no significant short of breath. No fever or chills. No dysuria hematuria.  Objective: Vitals:   09/15/21 2054 09/16/21 0100 09/16/21 0836 09/16/21 1201  BP: (!) 154/92 126/76 128/87 (!) 154/86  Pulse: (!) 107 68 99 97  Resp: 18 20 18 18   Temp: 98.5 F (36.9 C) 98.2 F (36.8 C) 97.6 F (36.4 C) 98.1 F (36.7 C)  TempSrc: Oral Oral Oral Oral  SpO2: 100% 100% 100% 100%  Weight:      Height:        Intake/Output Summary (Last 24 hours) at 09/16/2021 1328 Last data filed at 09/16/2021 0500 Gross per 24 hour  Intake 726.38 ml  Output 2850 ml  Net -2123.62 ml   Filed Weights   09/10/21 1028 09/14/21 0600  Weight: 85 kg 89.9 kg    Examination:  General exam: Ill-appearing. Respiratory system: Clear to auscultation. Respiratory effort normal. Cardiovascular system: S1 & S2 heard, RRR. No JVD, murmurs, rubs, gallops or clicks. No pedal edema. Gastrointestinal system: Abdomen is nondistended, soft and nontender. No organomegaly or masses felt. Normal bowel sounds heard. Central nervous system: Alert and oriented x2. No focal neurological deficits.  Extremities: Symmetric 5 x 5 power. Skin: No rashes, lesions or ulcers Psychiatry: Mood & affect appropriate.     Data Reviewed: I have personally reviewed following labs and imaging studies  CBC: Recent Labs  Lab 09/11/21 0620 09/12/21 0639 09/13/21 0029 09/14/21 1025 09/15/21 0615  WBC 3.2* 5.0 3.6* 4.1 4.9  NEUTROABS  --  3.0 1.6* 2.1  3.1  HGB 8.9* 8.9* 7.9* 8.5* 9.2*  HCT 28.1* 29.5* 26.9* 28.2* 29.7*  MCV 101.1* 101.4* 102.7* 101.8* 100.3*  PLT 106* 138* 122* 122* 850*   Basic Metabolic Panel: Recent Labs  Lab 09/12/21 0639 09/13/21 0029 09/14/21 1025 09/15/21 0615 09/16/21 0547  NA 144 142 140 139 140  K 5.3* 4.8 5.3* 5.6* 5.1  CL 113* 113* 112* 111 108  CO2 25 26 22 24 27   GLUCOSE 71 100* 94 104* 78  BUN 40* 34* 31* 30* 33*  CREATININE 2.09* 1.82* 1.67* 1.44* 1.53*  CALCIUM 9.0 8.7* 8.9 9.3 9.4  MG 1.7 1.6* 1.9 2.0  --   PHOS  --  3.7  --   --   --    GFR: Estimated Creatinine Clearance: 27.6 mL/min (A) (by C-G formula based on SCr of 1.53 mg/dL (H)). Liver Function Tests: Recent Labs  Lab 09/10/21 1045 09/12/21 0639  AST 30 32  ALT 29 32  ALKPHOS 79 91  BILITOT 0.7 0.7  PROT 7.3 7.2  ALBUMIN 3.2* 3.1*   No results for input(s): LIPASE, AMYLASE in the last 168 hours. No results for input(s): AMMONIA in the last 168 hours. Coagulation Profile: Recent Labs  Lab 09/10/21 1105  INR 1.1   Cardiac Enzymes: No results for input(s): CKTOTAL, CKMB, CKMBINDEX, TROPONINI in the last 168 hours. BNP (last 3 results) No results for input(s): PROBNP in the last 8760 hours. HbA1C: No results for input(s): HGBA1C in the last 72 hours. CBG: Recent Labs  Lab 09/15/21 2024 09/15/21 2349 09/16/21 0428 09/16/21 0835 09/16/21 1159  GLUCAP 241* 133* 86 65* 96   Lipid Profile: No results for input(s): CHOL, HDL, LDLCALC, TRIG, CHOLHDL, LDLDIRECT in the last 72 hours. Thyroid Function Tests: No results for input(s): TSH, T4TOTAL, FREET4, T3FREE, THYROIDAB in the last 72 hours. Anemia Panel: No results for input(s): VITAMINB12, FOLATE, FERRITIN, TIBC, IRON, RETICCTPCT in the last 72 hours. Sepsis Labs: Recent Labs  Lab 09/10/21 1105 09/10/21 1537 09/13/21 0029  PROCALCITON  --   --  <0.10  LATICACIDVEN 1.0 1.3  --     Recent Results (from the past 240 hour(s))  Blood Culture (routine x 2)      Status: None   Collection Time: 09/10/21 11:05 AM   Specimen: BLOOD  Result Value Ref Range Status   Specimen Description BLOOD BLOOD RIGHT FOREARM  Final   Special Requests   Final    BOTTLES DRAWN AEROBIC AND ANAEROBIC Blood Culture adequate volume   Culture   Final    NO GROWTH 5 DAYS Performed at [facility], [address] [place], [place] [address]    Report Status 09/15/2021 FINAL  Final  Resp Panel by RT-PCR (Flu A&B, Covid) In/Out Cath Urine     Status: Abnormal   Collection Time: 09/10/21 11:37 AM   Specimen: In/Out Cath Urine; Nasopharyngeal(NP) swabs in vial transport medium  Result Value Ref Range Status   SARS Coronavirus 2 by RT PCR POSITIVE (A) NEGATIVE Final    Comment: RESULT CALLED TO, READ BACK BY AND VERIFIED WITH: JENNIFER BELCHER 09/10/21 1313 KLW (  NOTE) SARS-CoV-2 target nucleic acids are DETECTED.  The SARS-CoV-2 RNA is generally detectable in upper respiratory specimens during the acute phase of infection. Positive results are indicative of the presence of the identified virus, but do not rule out bacterial infection or co-infection with other pathogens not detected by the test. Clinical correlation with patient history and other diagnostic information is necessary to determine patient infection status. The expected result is Negative.  Fact Sheet for Patients: EntrepreneurPulse.com.au  Fact Sheet for Healthcare Providers: IncredibleEmployment.be  This test is not yet approved or cleared by the [place] FDA and  has been authorized for detection and/or diagnosis of SARS-CoV-2 by FDA under an Emergency Use Authorization (EUA).  This EUA will remain in effect (meaning this test can be  used) for the duration of  the COVID-19 declaration under Section 564(b)(1) of the Act, 21 U.S.C. section 360bbb-3(b)(1), unless the authorization is terminated or revoked sooner.     Influenza A by PCR NEGATIVE  NEGATIVE Final   Influenza B by PCR NEGATIVE NEGATIVE Final    Comment: (NOTE) The Xpert Xpress SARS-CoV-2/FLU/RSV plus assay is intended as an aid in the diagnosis of influenza from Nasopharyngeal swab specimens and should not be used as a sole basis for treatment. Nasal washings and aspirates are unacceptable for Xpert Xpress SARS-CoV-2/FLU/RSV testing.  Fact Sheet for Patients: EntrepreneurPulse.com.au  Fact Sheet for Healthcare Providers: IncredibleEmployment.be  This test is not yet approved or cleared by the [place] FDA and has been authorized for detection and/or diagnosis of SARS-CoV-2 by FDA under an Emergency Use Authorization (EUA). This EUA will remain in effect (meaning this test can be used) for the duration of the COVID-19 declaration under Section 564(b)(1) of the Act, 21 U.S.C. section 360bbb-3(b)(1), unless the authorization is terminated or revoked.  Performed at [facility], [address] [place], [place] [address]   Blood Culture (routine x 2)     Status: None   Collection Time: 09/10/21 11:37 AM   Specimen: BLOOD  Result Value Ref Range Status   Specimen Description BLOOD BLOOD RIGHT HAND  Final   Special Requests   Final    BOTTLES DRAWN AEROBIC AND ANAEROBIC Blood Culture results may not be optimal due to an inadequate volume of blood received in culture bottles   Culture   Final    NO GROWTH 5 DAYS Performed at [facility], [address] [place], [place] [address]    Report Status 09/15/2021 FINAL  Final  Urine Culture     Status: Abnormal   Collection Time: 09/10/21  2:24 PM   Specimen: In/Out Cath Urine  Result Value Ref Range Status   Specimen Description   Final    IN/OUT CATH URINE Performed at [facility], [address], [place], [place] [address]    Special Requests   Final    NONE Performed at [facility], [address] [place], [place]  [address]    Culture MULTIPLE SPECIES PRESENT, SUGGEST RECOLLECTION (A)  Final   Report Status 09/11/2021 FINAL  Final         Radiology Studies: No results found.      Scheduled Meds:  albuterol  2 puff Inhalation Q6H   vitamin C  500 mg Oral Daily   megestrol  400 mg Oral Daily   predniSONE  20 mg Oral BID WC   [START ON 09/17/2021] sodium zirconium cyclosilicate  10 g Oral Daily   zinc sulfate  220 mg  Oral Daily   Continuous Infusions:  dextrose 10 % and 0.45 % NaCl 50 mL/hr at 09/16/21 0946     LOS: 6 days    Time spent: 32 minutes    Sharen Hones, MD [facility]   To contact the attending provider between 7A-7P or the covering provider during after hours 7P-7A, please log into the web site [email or web address] and access using universal [place] password for that web site. If you do not have the password, please call the hospital operator.  09/16/2021, 1:28 PM

## 2021-09-16 NOTE — Progress Notes (Addendum)
After reviewing the patient's chart and epic notes, I attempted to speak with the granddaughter again since patient lacks capacity into own decisions.    Granddaughter Iran Ouch and daughter [name] did not answer my phone call.  HIPAA appropriate voicemail left for both women.  I will await a callback from either family member to discuss palliative medicine needs and goals of care.  [place] Ilsa Iha, FNP-BC Palliative Medicine Team Team Phone # [phone]  NO CHARGE

## 2021-09-17 DIAGNOSIS — U071 COVID-19: Secondary | ICD-10-CM | POA: Diagnosis not present

## 2021-09-17 DIAGNOSIS — N390 Urinary tract infection, site not specified: Secondary | ICD-10-CM | POA: Diagnosis not present

## 2021-09-17 DIAGNOSIS — G9341 Metabolic encephalopathy: Secondary | ICD-10-CM | POA: Diagnosis not present

## 2021-09-17 LAB — CBC WITH DIFFERENTIAL/PLATELET
Abs Immature Granulocytes: 0.16 10*3/uL — ABNORMAL HIGH (ref 0.00–0.07)
Basophils Absolute: 0 10*3/uL (ref 0.0–0.1)
Basophils Relative: 0 %
Eosinophils Absolute: 0 10*3/uL (ref 0.0–0.5)
Eosinophils Relative: 0 %
HCT: 26.8 % — ABNORMAL LOW (ref 36.0–46.0)
Hemoglobin: 8.5 g/dL — ABNORMAL LOW (ref 12.0–15.0)
Immature Granulocytes: 2 %
Lymphocytes Relative: 21 %
Lymphs Abs: 1.9 10*3/uL (ref 0.7–4.0)
MCH: 30 pg (ref 26.0–34.0)
MCHC: 31.7 g/dL (ref 30.0–36.0)
MCV: 94.7 fL (ref 80.0–100.0)
Monocytes Absolute: 1.1 10*3/uL — ABNORMAL HIGH (ref 0.1–1.0)
Monocytes Relative: 12 %
Neutro Abs: 5.9 10*3/uL (ref 1.7–7.7)
Neutrophils Relative %: 65 %
Platelets: 157 10*3/uL (ref 150–400)
RBC: 2.83 MIL/uL — ABNORMAL LOW (ref 3.87–5.11)
RDW: 14.9 % (ref 11.5–15.5)
WBC: 9.1 10*3/uL (ref 4.0–10.5)
nRBC: 0 % (ref 0.0–0.2)

## 2021-09-17 LAB — GLUCOSE, CAPILLARY
Glucose-Capillary: 122 mg/dL — ABNORMAL HIGH (ref 70–99)
Glucose-Capillary: 106 mg/dL — ABNORMAL HIGH (ref 70–99)
Glucose-Capillary: 151 mg/dL — ABNORMAL HIGH (ref 70–99)
Glucose-Capillary: 167 mg/dL — ABNORMAL HIGH (ref 70–99)
Glucose-Capillary: 192 mg/dL — ABNORMAL HIGH (ref 70–99)

## 2021-09-17 LAB — BASIC METABOLIC PANEL
Anion gap: 7 (ref 5–15)
BUN: 33 mg/dL — ABNORMAL HIGH (ref 8–23)
CO2: 24 mmol/L (ref 22–32)
Calcium: 9.1 mg/dL (ref 8.9–10.3)
Chloride: 108 mmol/L (ref 98–111)
Creatinine, Ser: 1.9 mg/dL — ABNORMAL HIGH (ref 0.44–1.00)
GFR, Estimated: 25 mL/min — ABNORMAL LOW (ref >60)
Glucose, Bld: 155 mg/dL — ABNORMAL HIGH (ref 70–99)
Potassium: 5 mmol/L (ref 3.5–5.1)
Sodium: 139 mmol/L (ref 135–145)

## 2021-09-17 NOTE — Progress Notes (Signed)
PROGRESS NOTE    [name] [name]  BJS:[id number] DOB: [birth year] DOA: 09/10/2021 PCP: Beckie Salts, MD    Brief Narrative:  [name] [name] is a 87 y.o. female with medical history significant for dementia, diabetes mellitus with complications of stage IV chronic kidney disease, hypertension and dyslipidemia who presents to the emergency room from the skilled nursing facility where she resides for evaluation of change in mental status and increased somnolence. She was found to have COVID, chest x-ray showed a central pulmonary vessel prominence.  Patient was also found to have abnormal urine, placed on Rocephin for UTI. Patient has completed antibiotics, however, patient had persistent hypoglycemia requiring D10 since admission.   Assessment & Plan:   Principal Problem:   Acute metabolic encephalopathy Active Problems:   Acute lower UTI   Sepsis ([place])   HTN (hypertension)   CKD stage 4 due to type 2 diabetes mellitus (HCC)   Dementia with behavioral disturbance   COVID-19 virus infection   Hyperkalemia   Bradycardia   UTI (urinary tract infection)   Pressure injury of skin  Type 2 diabetes with hypoglycemia. Anorexia. Patient is given megestrol, prednisone and D10.  Glucose finally appears to be better.  Prednisone dose as yesterday increased to 20 mg twice a day.  I will discontinue D10 for now. Patient appetite seem to be better.  Sepsis is secondary to UTI. Urinary tract infection. Acute metabolic encephalopathy secondary to UTI and COVID. Sinus bradycardia. Condition had improved.  Antibiotics completed.  Patient still is disoriented to place and time, she has baseline dementia.  Continue Haldol as needed for agitation.  Chronic kidney disease stage IV. Hyperkalemia. Patient potassium level finally came down, continue Lokelma twice a day.  Recheck BMP tomorrow.  Pancytopenia. COVID infection. Continue to follow.  Patient has been seen by palliative care, at this point,  family does not accept complete care.  They want the patient to be placed.   DVT prophylaxis: Lovenox Code Status: full Family Communication:  Disposition Plan:      Status is: Inpatient   Remains inpatient appropriate because: Due to severity of disease, IV fluids         I/O last 3 completed shifts: In: 236 [P.O.:236] Out: 2950 [Urine:2950] No intake/output data recorded.      Subjective: Patient glucose finally better, she she is able to eat a small meal today.  No nausea vomiting abdominal pain. Still confused, occasional agitation. No fever or chills. No short of breath or cough.  No hypoxia.  Objective: Vitals:   09/16/21 2354 09/17/21 0413 09/17/21 0820 09/17/21 1237  BP: 125/73 128/64 (!) 173/85 (!) 142/90  Pulse: (!) 107 88 67 93  Resp: 18 16 20 18   Temp: 98.3 F (36.8 C) 98.4 F (36.9 C) 97.7 F (36.5 C) 97.8 F (36.6 C)  TempSrc: Oral Oral Oral   SpO2: 100% 100% 100% 98%  Weight:      Height:        Intake/Output Summary (Last 24 hours) at 09/17/2021 1352 Last data filed at 09/17/2021 0415 Gross per 24 hour  Intake --  Output 900 ml  Net -900 ml   Filed Weights   09/10/21 1028 09/14/21 0600  Weight: 85 kg 89.9 kg    Examination:  General exam: Frail and confused. Respiratory system: Clear to auscultation. Respiratory effort normal. Cardiovascular system: S1 & S2 heard, RRR. No JVD, murmurs, rubs, gallops or clicks. No pedal edema. Gastrointestinal system: Abdomen is nondistended, soft and nontender. No organomegaly or  masses felt. Normal bowel sounds heard. Central nervous system: Alert and oriented x1. No focal neurological deficits. Extremities: Symmetric  Skin: No rashes, lesions or ulcers Psychiatry: Mood & affect appropriate.     Data Reviewed: I have personally reviewed following labs and imaging studies  CBC: Recent Labs  Lab 09/12/21 0639 09/13/21 0029 09/14/21 1025 09/15/21 0615 09/17/21 0556  WBC 5.0 3.6* 4.1 4.9  9.1  NEUTROABS 3.0 1.6* 2.1 3.1 5.9  HGB 8.9* 7.9* 8.5* 9.2* 8.5*  HCT 29.5* 26.9* 28.2* 29.7* 26.8*  MCV 101.4* 102.7* 101.8* 100.3* 94.7  PLT 138* 122* 122* 136* 527   Basic Metabolic Panel: Recent Labs  Lab 09/12/21 0639 09/13/21 0029 09/14/21 1025 09/15/21 0615 09/16/21 0547 09/17/21 0556  NA 144 142 140 139 140 139  K 5.3* 4.8 5.3* 5.6* 5.1 5.0  CL 113* 113* 112* 111 108 108  CO2 25 26 22 24 27 24   GLUCOSE 71 100* 94 104* 78 155*  BUN 40* 34* 31* 30* 33* 33*  CREATININE 2.09* 1.82* 1.67* 1.44* 1.53* 1.90*  CALCIUM 9.0 8.7* 8.9 9.3 9.4 9.1  MG 1.7 1.6* 1.9 2.0  --   --   PHOS  --  3.7  --   --   --   --    GFR: Estimated Creatinine Clearance: 22.2 mL/min (A) (by C-G formula based on SCr of 1.9 mg/dL (H)). Liver Function Tests: Recent Labs  Lab 09/12/21 0639  AST 32  ALT 32  ALKPHOS 91  BILITOT 0.7  PROT 7.2  ALBUMIN 3.1*   No results for input(s): LIPASE, AMYLASE in the last 168 hours. No results for input(s): AMMONIA in the last 168 hours. Coagulation Profile: No results for input(s): INR, PROTIME in the last 168 hours. Cardiac Enzymes: No results for input(s): CKTOTAL, CKMB, CKMBINDEX, TROPONINI in the last 168 hours. BNP (last 3 results) No results for input(s): PROBNP in the last 8760 hours. HbA1C: No results for input(s): HGBA1C in the last 72 hours. CBG: Recent Labs  Lab 09/16/21 1943 09/16/21 2349 09/17/21 0410 09/17/21 0823 09/17/21 1234  GLUCAP 192* 121* 122* 106* 151*   Lipid Profile: No results for input(s): CHOL, HDL, LDLCALC, TRIG, CHOLHDL, LDLDIRECT in the last 72 hours. Thyroid Function Tests: No results for input(s): TSH, T4TOTAL, FREET4, T3FREE, THYROIDAB in the last 72 hours. Anemia Panel: No results for input(s): VITAMINB12, FOLATE, FERRITIN, TIBC, IRON, RETICCTPCT in the last 72 hours. Sepsis Labs: Recent Labs  Lab 09/10/21 1537 09/13/21 0029  PROCALCITON  --  <0.10  LATICACIDVEN 1.3  --     Recent Results (from the  past 240 hour(s))  Blood Culture (routine x 2)     Status: None   Collection Time: 09/10/21 11:05 AM   Specimen: BLOOD  Result Value Ref Range Status   Specimen Description BLOOD BLOOD RIGHT FOREARM  Final   Special Requests   Final    BOTTLES DRAWN AEROBIC AND ANAEROBIC Blood Culture adequate volume   Culture   Final    NO GROWTH 5 DAYS Performed at [facility], [address] [place], [place] [address]    Report Status 09/15/2021 FINAL  Final  Resp Panel by RT-PCR (Flu A&B, Covid) In/Out Cath Urine     Status: Abnormal   Collection Time: 09/10/21 11:37 AM   Specimen: In/Out Cath Urine; Nasopharyngeal(NP) swabs in vial transport medium  Result Value Ref Range Status   SARS Coronavirus 2 by RT PCR POSITIVE (A) NEGATIVE Final    Comment:  RESULT CALLED TO, READ BACK BY AND VERIFIED WITH: JENNIFER BELCHER 09/10/21 1313 KLW (NOTE) SARS-CoV-2 target nucleic acids are DETECTED.  The SARS-CoV-2 RNA is generally detectable in upper respiratory specimens during the acute phase of infection. Positive results are indicative of the presence of the identified virus, but do not rule out bacterial infection or co-infection with other pathogens not detected by the test. Clinical correlation with patient history and other diagnostic information is necessary to determine patient infection status. The expected result is Negative.  Fact Sheet for Patients: EntrepreneurPulse.com.au  Fact Sheet for Healthcare Providers: IncredibleEmployment.be  This test is not yet approved or cleared by the [place] FDA and  has been authorized for detection and/or diagnosis of SARS-CoV-2 by FDA under an Emergency Use Authorization (EUA).  This EUA will remain in effect (meaning this test can be  used) for the duration of  the COVID-19 declaration under Section 564(b)(1) of the Act, 21 U.S.C. section 360bbb-3(b)(1), unless the authorization is terminated or  revoked sooner.     Influenza A by PCR NEGATIVE NEGATIVE Final   Influenza B by PCR NEGATIVE NEGATIVE Final    Comment: (NOTE) The Xpert Xpress SARS-CoV-2/FLU/RSV plus assay is intended as an aid in the diagnosis of influenza from Nasopharyngeal swab specimens and should not be used as a sole basis for treatment. Nasal washings and aspirates are unacceptable for Xpert Xpress SARS-CoV-2/FLU/RSV testing.  Fact Sheet for Patients: EntrepreneurPulse.com.au  Fact Sheet for Healthcare Providers: IncredibleEmployment.be  This test is not yet approved or cleared by the [place] FDA and has been authorized for detection and/or diagnosis of SARS-CoV-2 by FDA under an Emergency Use Authorization (EUA). This EUA will remain in effect (meaning this test can be used) for the duration of the COVID-19 declaration under Section 564(b)(1) of the Act, 21 U.S.C. section 360bbb-3(b)(1), unless the authorization is terminated or revoked.  Performed at [facility], [address] [place], [place] [address]   Blood Culture (routine x 2)     Status: None   Collection Time: 09/10/21 11:37 AM   Specimen: BLOOD  Result Value Ref Range Status   Specimen Description BLOOD BLOOD RIGHT HAND  Final   Special Requests   Final    BOTTLES DRAWN AEROBIC AND ANAEROBIC Blood Culture results may not be optimal due to an inadequate volume of blood received in culture bottles   Culture   Final    NO GROWTH 5 DAYS Performed at [facility], [address], [place], [place] [address]    Report Status 09/15/2021 FINAL  Final  Urine Culture     Status: Abnormal   Collection Time: 09/10/21  2:24 PM   Specimen: In/Out Cath Urine  Result Value Ref Range Status   Specimen Description   Final    IN/OUT CATH URINE Performed at [facility], [address] [place], [place] [address]    Special Requests   Final    NONE Performed at [facility], [address] [place], [place] [address]    Culture MULTIPLE SPECIES PRESENT, SUGGEST RECOLLECTION (A)  Final   Report Status 09/11/2021 FINAL  Final         Radiology Studies: No results found.      Scheduled Meds:  albuterol  2 puff Inhalation Q6H   vitamin C  500 mg Oral Daily   megestrol  400 mg Oral Daily   predniSONE  20 mg Oral BID WC   sodium zirconium cyclosilicate  10 g Oral Daily   zinc sulfate  220 mg Oral Daily   Continuous Infusions:   LOS: 7 days    Time spent: 28 minutes    Sharen Hones, MD [facility]   To contact the attending provider between 7A-7P or the covering provider during after hours 7P-7A, please log into the web site [email or web address] and access using universal [place] password for that web site. If you do not have the password, please call the hospital operator.  09/17/2021, 1:52 PM

## 2021-09-17 NOTE — Plan of Care (Signed)
Patient transferred from 2A last night at shift change.  Tele-sitter in place.  She was pleasantly confused and cooperative.  She drink during the night two orange juice, one apple juice and ate graham crackers.  CBG 122 this morning.  D10% and 0.45%NaCl infusion continues.  Patient reports that she is hungry and wants to eat breakfast.

## 2021-09-17 NOTE — Progress Notes (Signed)
After reviewing the patient's chart, epic notes, labs, and imaging, I assessed the patient.  She continues to be pleasantly confused and is asking when she can go home.  I again attempted to speak with the granddaughter as well as the daughter.  I received no answer from either.  I again left a HIPAA appropriate voicemail for granddaughter.  I will continue to await callback to discuss goals of care and advance care planning with the family.  Colon Ilsa Iha, FNP-BC Palliative Medicine Team Team Phone # [phone]  NO CHARGE

## 2021-09-17 NOTE — Consult Note (Signed)
Consultation Note Date: 09/17/2021   Patient Name: [name] [name]  DOB: [birth year]  MRN: [id number]  Age / Sex: 87 y.o., female  PCP: Beckie Salts, MD Referring Physician: Sharen Hones, MD  Reason for Consultation: Establishing goals of care  HPI/Patient Profile: 87 y.o. female  with past medical history of dementia, depression, diabetes type 2, anemia, chronic kidney disease, hypertension, dyslipidemia, and was admitted about 2 months ago as a result of a fall admitted on 09/10/2021 with AMS and was found to have COVID and abnormal urine.  This is her fourth admission in 6 months.  Palliative medicine was consulted to discuss goals of care.  Clinical Assessment and Goals of Care: I have reviewed medical records including EPIC notes, labs and imaging, received report from nursing, assessed the patient and then spoke with the patient's granddaughter Iran Ouch over the phone to discuss diagnosis, prognosis, GOC, EOL wishes, disposition and options.  During my assessment with the patient she was able to recall her name and date of birth.  She is not oriented to place, time, or situation.  She stated she is in her room and just needs to go across the hall.  She said that she is got to go to a wedding and that [name] has her money.  I appreciate that her mentation is poor due to her dementia and so I contacted her next of kin/granddaughter [name].  I introduced Palliative Medicine as specialized medical care for people living with serious illness. It focuses on providing relief from the symptoms and stress of a serious illness. The goal is to improve quality of life for both the patient and the family.  We discussed a brief life review of the patient.  Iran Ouch shares that the patient has not worked in over 53 years.  She stated home and had lots of company in and out.  She did not describe any hobbies or activities  that the patient formerly enjoyed.  As far as functional and nutritional status prior to admission Iran Ouch was able to take care of her until placing her in a skilled nursing facility a few months ago.  Patient is wheelchair-bound.  Iran Ouch shares that she has noticed the patient would make odd comments or stop eating every now and then.  She now looks back on it and realizes it was likely the early stages of dementia.  However, she feels the patient plays as if she can eat sometimes when she in fact can.  We discussed patient's current illness and what it means in the larger context of patient's on-going co-morbidities.  Natural disease trajectory and expectations at EOL were discussed.  I reviewed that dementia is a progressive and chronic disease that cannot be reversed or cured.  I shared that the decline that [name] noticed in her grandmother was likely due to her dementia.  This baseline neurological dysfunction could also be complicated by her COVID status and abnormal urine.   I attempted to elicit values and goals of care important to the patient. The  difference between aggressive medical intervention and comfort care was considered in light of the patient's goals of care.  Iran Ouch shares that she knows nobody lives forever and that her grandmother is old.  However she does not think she is at the end of her life.  Iran Ouch has been unhappy with the care that her grandmother received at the nursing facility.  She shared she often found her dirty, wet, or not even in the correct room.  Iran Ouch would like for the patient to go to a memory care unit and receive continued nursing care.   I detailed that the patient is currently receiving dextrose through her IV.  I shared that should this medication be stopped that she would need to have adequate oral intake for nutrition and hydration.  Iran Ouch is confident that the patient will continue to eat more.  I again shared that dementia is a  chronic and progressive disease that oftentimes changes are normal habits of eating, drinking, conversation, and functional ability.  [name] shared that her mother [name] was quite upset when receiving a phone call from palliative medicine.  Iran Ouch says she now has a better understanding of what palliative is and that it is not hospice.  She said she will relay this info to her mother [name].  I offered to speak with [name] at any time and answer any questions she has regarding her mother's plan of care.  In summary [name] does not want to start a comfort pathway at this time.  She instead would like to have her grandmother placed in a different facility, preferably a memory care unit that is equipped to handle people with advanced dementia.  Palliative care services outpatient were explained and offered.  Iran Ouch was appreciative our discussion and said she would like to continue her advanced care planning and goals of care discussions with the palliative medicine team outpatient..  Discussed with patient/family the importance of continued conversation with family and the medical providers regarding overall plan of care and treatment options, ensuring decisions are within the context of the patient's values and GOCs.    Questions and concerns were addressed. The family was encouraged to call with questions or concerns.   Primary Decision Maker NEXT OF KIN  Code Status/Advance Care Planning: DNR  Prognosis:   Unable to determine  Discharge Planning: [place] for rehab with Palliative care service follow-up  Primary Diagnoses: Present on Admission:  Acute metabolic encephalopathy  CKD stage 4 due to type 2 diabetes mellitus ([place])  Dementia with behavioral disturbance  HTN (hypertension)  COVID-19 virus infection  Hyperkalemia  Bradycardia  Acute lower UTI  Sepsis ([place])  UTI (urinary tract infection)   Physical Exam Vitals and nursing note reviewed.   Constitutional:      General: She is not in acute distress.    Appearance: She is not ill-appearing.  HENT:     Head: Normocephalic and atraumatic.     Mouth/Throat:     Mouth: Mucous membranes are moist.  Cardiovascular:     Rate and Rhythm: Normal rate.     Pulses: Normal pulses.  Pulmonary:     Effort: Pulmonary effort is normal.  Abdominal:     Palpations: Abdomen is soft.  Musculoskeletal:     Comments: Bedbound, uses wheelchair at baseline  Neurological:     Mental Status: She is alert. Mental status is at baseline. She is disoriented.  Psychiatric:        Mood and Affect: Mood normal.  Comments: Thought content not linear    Vital Signs: BP (!) 142/90 (BP Location: Right Arm)   Pulse 93   Temp 97.8 F (36.6 C)   Resp 18   Ht 5\' 5"  (1.651 m)   Wt 89.9 kg   SpO2 98%   BMI 32.98 kg/m  Pain Scale: 0-10   Pain Score: 0-No pain SpO2: SpO2: 98 % O2 Device:SpO2: 98 % O2 Flow Rate: .O2 Flow Rate (L/min): 1 L/min  Palliative Assessment/Data: 40%     I discussed this patient's plan of care with patient's gradndaughter, Dr. Roosevelt Locks, Case Manager Pryor Montes, and bedside nurse Di Kindle.  Thank you for this consult. Palliative medicine will continue to follow and assist holistically.   Time Total: 70 minutes Greater than 50%  of this time was spent counseling and coordinating care related to the above assessment and plan.  Signed by: Jordan Hawks, DNP, FNP-BC Palliative Medicine    Please contact Palliative Medicine Team phone at [phone] for questions and concerns.  For individual provider: See Shea Evans

## 2021-09-18 DIAGNOSIS — G9341 Metabolic encephalopathy: Secondary | ICD-10-CM | POA: Diagnosis not present

## 2021-09-18 LAB — GLUCOSE, CAPILLARY
Glucose-Capillary: 121 mg/dL — ABNORMAL HIGH (ref 70–99)
Glucose-Capillary: 163 mg/dL — ABNORMAL HIGH (ref 70–99)
Glucose-Capillary: 169 mg/dL — ABNORMAL HIGH (ref 70–99)
Glucose-Capillary: 208 mg/dL — ABNORMAL HIGH (ref 70–99)
Glucose-Capillary: 83 mg/dL (ref 70–99)
Glucose-Capillary: 95 mg/dL (ref 70–99)

## 2021-09-18 LAB — BASIC METABOLIC PANEL
Anion gap: 6 (ref 5–15)
BUN: 37 mg/dL — ABNORMAL HIGH (ref 8–23)
CO2: 26 mmol/L (ref 22–32)
Calcium: 9 mg/dL (ref 8.9–10.3)
Chloride: 108 mmol/L (ref 98–111)
Creatinine, Ser: 1.83 mg/dL — ABNORMAL HIGH (ref 0.44–1.00)
GFR, Estimated: 26 mL/min — ABNORMAL LOW (ref >60)
Glucose, Bld: 117 mg/dL — ABNORMAL HIGH (ref 70–99)
Potassium: 5.1 mmol/L (ref 3.5–5.1)
Sodium: 140 mmol/L (ref 135–145)

## 2021-09-18 MED ORDER — SODIUM POLYSTYRENE SULFONATE 15 GM/60ML PO SUSP
15.0000 g | Freq: Once | ORAL | Status: AC
  Administered 2021-09-18: 16:00:00 15 g via ORAL
  Filled 2021-09-18: qty 60

## 2021-09-18 NOTE — Progress Notes (Signed)
PROGRESS NOTE    Tammie Yanda  RSW:[id number] DOB: 11-12-1929 DOA: 09/10/2021 PCP: Beckie Salts, MD    Brief Narrative:  [name] [name] is a 87 y.o. female with medical history significant for dementia, diabetes mellitus with complications of stage IV chronic kidney disease, hypertension and dyslipidemia who presents to the emergency room from the skilled nursing facility where she resides for evaluation of change in mental status and increased somnolence. She was found to have COVID, chest x-ray showed a central pulmonary vessel prominence.  Patient was also found to have abnormal urine, placed on Rocephin for UTI. Patient has completed antibiotics, however, patient had persistent hypoglycemia requiring D10 since admission.  11/9 on no overnight issues   Assessment & Plan:   Principal Problem:   Acute metabolic encephalopathy Active Problems:   Acute lower UTI   Sepsis ([place])   HTN (hypertension)   CKD stage 4 due to type 2 diabetes mellitus (HCC)   Dementia with behavioral disturbance   COVID-19 virus infection   Hyperkalemia   Bradycardia   UTI (urinary tract infection)   Pressure injury of skin  Type 2 diabetes with hypoglycemia. Anorexia. Patient is given megestrol, prednisone and D10.  Glucose finally appears to be better.  Prednisone dose as yesterday increased to 20 mg twice a day.  11/9- D10 was discontinued Per nursing she ate her breakfast and part of her lunch We will continue to monitor   Sepsis is secondary to UTI. Urinary tract infection. Acute metabolic encephalopathy secondary to UTI and COVID. Sinus bradycardia. Condition had improved.  Antibiotics completed.   11/9 patient with baseline dementia  Haldol for agitation as needed    Chronic kidney disease stage IV. Hyperkalemia. K5.1 Will give Kayexalate 15 g x 1   Pancytopenia. COVID infection. Stable continue to monitor  Patient has been seen by palliative care, at this point, family does not  accept complete care.  They want the patient to be placed.   DVT prophylaxis: Lovenox Code Status: full Family Communication:  Disposition Plan: Placement pending     Status is: Inpatient   Remains inpatient appropriate because: Due to severity of disease, IV fluids         I/O last 3 completed shifts: In: 100 [P.O.:100] Out: 3200 [Urine:3200] No intake/output data recorded.      Subjective: Patient denies shortness of breath, chest pain, dizziness or abdominal pain  Objective: Vitals:   09/17/21 2044 09/18/21 0051 09/18/21 0646 09/18/21 0855  BP: (!) 144/75 (!) 146/81 120/75 128/67  Pulse: (!) 108 (!) 107 64 69  Resp: 16 16 16 18   Temp: 99.4 F (37.4 C) 99.5 F (37.5 C)  98.7 F (37.1 C)  TempSrc:    Oral  SpO2: 97% 100% 96% 100%  Weight:      Height:        Intake/Output Summary (Last 24 hours) at 09/18/2021 0920 Last data filed at 09/17/2021 2055 Gross per 24 hour  Intake 100 ml  Output 1900 ml  Net -1800 ml   Filed Weights   09/10/21 1028 09/14/21 0600  Weight: 85 kg 89.9 kg    Examination: NAD, calm decreased breath sounds no wheezing Regular S1-S2 no gallops Soft benign positive bowel sounds No edema Mood and affect appropriate in current setting    Data Reviewed: I have personally reviewed following labs and imaging studies  CBC: Recent Labs  Lab 09/12/21 0639 09/13/21 0029 09/14/21 1025 09/15/21 0615 09/17/21 0556  WBC 5.0 3.6* 4.1 4.9 9.1  NEUTROABS 3.0 1.6* 2.1 3.1 5.9  HGB 8.9* 7.9* 8.5* 9.2* 8.5*  HCT 29.5* 26.9* 28.2* 29.7* 26.8*  MCV 101.4* 102.7* 101.8* 100.3* 94.7  PLT 138* 122* 122* 136* 485   Basic Metabolic Panel: Recent Labs  Lab 09/12/21 0639 09/13/21 0029 09/14/21 1025 09/15/21 0615 09/16/21 0547 09/17/21 0556 09/18/21 0522  NA 144 142 140 139 140 139 140  K 5.3* 4.8 5.3* 5.6* 5.1 5.0 5.1  CL 113* 113* 112* 111 108 108 108  CO2 25 26 22 24 27 24 26   GLUCOSE 71 100* 94 104* 78 155* 117*  BUN 40* 34*  31* 30* 33* 33* 37*  CREATININE 2.09* 1.82* 1.67* 1.44* 1.53* 1.90* 1.83*  CALCIUM 9.0 8.7* 8.9 9.3 9.4 9.1 9.0  MG 1.7 1.6* 1.9 2.0  --   --   --   PHOS  --  3.7  --   --   --   --   --    GFR: Estimated Creatinine Clearance: 23.1 mL/min (A) (by C-G formula based on SCr of 1.83 mg/dL (H)). Liver Function Tests: Recent Labs  Lab 09/12/21 0639  AST 32  ALT 32  ALKPHOS 91  BILITOT 0.7  PROT 7.2  ALBUMIN 3.1*   No results for input(s): LIPASE, AMYLASE in the last 168 hours. No results for input(s): AMMONIA in the last 168 hours. Coagulation Profile: No results for input(s): INR, PROTIME in the last 168 hours. Cardiac Enzymes: No results for input(s): CKTOTAL, CKMB, CKMBINDEX, TROPONINI in the last 168 hours. BNP (last 3 results) No results for input(s): PROBNP in the last 8760 hours. HbA1C: No results for input(s): HGBA1C in the last 72 hours. CBG: Recent Labs  Lab 09/17/21 1234 09/17/21 1635 09/17/21 2043 09/18/21 0048 09/18/21 0854  GLUCAP 151* 167* 192* 121* 83   Lipid Profile: No results for input(s): CHOL, HDL, LDLCALC, TRIG, CHOLHDL, LDLDIRECT in the last 72 hours. Thyroid Function Tests: No results for input(s): TSH, T4TOTAL, FREET4, T3FREE, THYROIDAB in the last 72 hours. Anemia Panel: No results for input(s): VITAMINB12, FOLATE, FERRITIN, TIBC, IRON, RETICCTPCT in the last 72 hours. Sepsis Labs: Recent Labs  Lab 09/13/21 0029  PROCALCITON <0.10    Recent Results (from the past 240 hour(s))  Blood Culture (routine x 2)     Status: None   Collection Time: 09/10/21 11:05 AM   Specimen: BLOOD  Result Value Ref Range Status   Specimen Description BLOOD BLOOD RIGHT FOREARM  Final   Special Requests   Final    BOTTLES DRAWN AEROBIC AND ANAEROBIC Blood Culture adequate volume   Culture   Final    NO GROWTH 5 DAYS Performed at [facility], [address] [place], [place] [address]    Report Status 09/15/2021 FINAL  Final  Resp Panel by RT-PCR  (Flu A&B, Covid) In/Out Cath Urine     Status: Abnormal   Collection Time: 09/10/21 11:37 AM   Specimen: In/Out Cath Urine; Nasopharyngeal(NP) swabs in vial transport medium  Result Value Ref Range Status   SARS Coronavirus 2 by RT PCR POSITIVE (A) NEGATIVE Final    Comment: RESULT CALLED TO, READ BACK BY AND VERIFIED WITH: JENNIFER BELCHER 09/10/21 1313 KLW (NOTE) SARS-CoV-2 target nucleic acids are DETECTED.  The SARS-CoV-2 RNA is generally detectable in upper respiratory specimens during the acute phase of infection. Positive results are indicative of the presence of the identified virus, but do not rule out bacterial infection or co-infection with other pathogens not detected by the  test. Clinical correlation with patient history and other diagnostic information is necessary to determine patient infection status. The expected result is Negative.  Fact Sheet for Patients: EntrepreneurPulse.com.au  Fact Sheet for Healthcare Providers: IncredibleEmployment.be  This test is not yet approved or cleared by the [place] FDA and  has been authorized for detection and/or diagnosis of SARS-CoV-2 by FDA under an Emergency Use Authorization (EUA).  This EUA will remain in effect (meaning this test can be  used) for the duration of  the COVID-19 declaration under Section 564(b)(1) of the Act, 21 U.S.C. section 360bbb-3(b)(1), unless the authorization is terminated or revoked sooner.     Influenza A by PCR NEGATIVE NEGATIVE Final   Influenza B by PCR NEGATIVE NEGATIVE Final    Comment: (NOTE) The Xpert Xpress SARS-CoV-2/FLU/RSV plus assay is intended as an aid in the diagnosis of influenza from Nasopharyngeal swab specimens and should not be used as a sole basis for treatment. Nasal washings and aspirates are unacceptable for Xpert Xpress SARS-CoV-2/FLU/RSV testing.  Fact Sheet for Patients: EntrepreneurPulse.com.au  Fact  Sheet for Healthcare Providers: IncredibleEmployment.be  This test is not yet approved or cleared by the [place] FDA and has been authorized for detection and/or diagnosis of SARS-CoV-2 by FDA under an Emergency Use Authorization (EUA). This EUA will remain in effect (meaning this test can be used) for the duration of the COVID-19 declaration under Section 564(b)(1) of the Act, 21 U.S.C. section 360bbb-3(b)(1), unless the authorization is terminated or revoked.  Performed at [facility], [address] [place], [place] [address]   Blood Culture (routine x 2)     Status: None   Collection Time: 09/10/21 11:37 AM   Specimen: BLOOD  Result Value Ref Range Status   Specimen Description BLOOD BLOOD RIGHT HAND  Final   Special Requests   Final    BOTTLES DRAWN AEROBIC AND ANAEROBIC Blood Culture results may not be optimal due to an inadequate volume of blood received in culture bottles   Culture   Final    NO GROWTH 5 DAYS Performed at [facility], [address], [place], [place] [address]    Report Status 09/15/2021 FINAL  Final  Urine Culture     Status: Abnormal   Collection Time: 09/10/21  2:24 PM   Specimen: In/Out Cath Urine  Result Value Ref Range Status   Specimen Description   Final    IN/OUT CATH URINE Performed at [facility], [address], [place], [place] [address]    Special Requests   Final    NONE Performed at [facility], [address] [place], [place] [address]    Culture MULTIPLE SPECIES PRESENT, SUGGEST RECOLLECTION (A)  Final   Report Status 09/11/2021 FINAL  Final         Radiology Studies: No results found.      Scheduled Meds:  albuterol  2 puff Inhalation Q6H   vitamin C  500 mg Oral Daily   megestrol  400 mg Oral Daily   predniSONE  20 mg Oral BID WC   sodium zirconium cyclosilicate  10 g Oral Daily   zinc sulfate  220 mg Oral Daily   Continuous Infusions:    LOS: 8 days    Time spent: 35 minutes with more than 50% on COC    Nolberto Hanlon, MD [facility]   To contact the attending provider between 7A-7P or the covering provider during after hours 7P-7A, please log into the web  site [email or web address] and access using universal [place] password for that web site. If you do not have the password, please call the hospital operator.  09/18/2021, 9:20 AM

## 2021-09-19 DIAGNOSIS — G9341 Metabolic encephalopathy: Secondary | ICD-10-CM | POA: Diagnosis not present

## 2021-09-19 LAB — GLUCOSE, CAPILLARY
Glucose-Capillary: 129 mg/dL — ABNORMAL HIGH (ref 70–99)
Glucose-Capillary: 260 mg/dL — ABNORMAL HIGH (ref 70–99)
Glucose-Capillary: 174 mg/dL — ABNORMAL HIGH (ref 70–99)
Glucose-Capillary: 180 mg/dL — ABNORMAL HIGH (ref 70–99)
Glucose-Capillary: 179 mg/dL — ABNORMAL HIGH (ref 70–99)

## 2021-09-19 LAB — POTASSIUM: Potassium: 4.2 mmol/L (ref 3.5–5.1)

## 2021-09-19 LAB — CREATININE, SERUM
Creatinine, Ser: 1.7 mg/dL — ABNORMAL HIGH (ref 0.44–1.00)
GFR, Estimated: 28 mL/min — ABNORMAL LOW (ref >60)

## 2021-09-19 MED ORDER — ALBUTEROL SULFATE HFA 108 (90 BASE) MCG/ACT IN AERS
2.0000 | INHALATION_SPRAY | Freq: Four times a day (QID) | RESPIRATORY_TRACT | Status: DC | PRN
  Administered 2021-09-19: 20:00:00 2 via RESPIRATORY_TRACT
  Filled 2021-09-19: qty 6.7

## 2021-09-19 MED ORDER — ENOXAPARIN SODIUM 30 MG/0.3ML IJ SOSY
30.0000 mg | PREFILLED_SYRINGE | INTRAMUSCULAR | Status: DC
  Administered 2021-09-19: 22:00:00 30 mg via SUBCUTANEOUS
  Filled 2021-09-19: qty 0.3

## 2021-09-19 NOTE — Progress Notes (Signed)
After reviewing the patient's chart and epic notes, I assessed the patient at bedside.  She continues to be pleasantly confused.  I attempted to speak with her granddaughter Iran Ouch.  No answer and voicemail was left.  I shared that palliative medicine team will continue to monitor the patient on the peripheral.  We will intervene should the patient status decline or should the family reach out to [place] with needs.  [place] Ilsa Iha, FNP-BC Palliative Medicine Team Team Phone # [phone]   NO CHARGE

## 2021-09-19 NOTE — TOC Progression Note (Signed)
Transition of Care [facility]) - Progression Note    Patient Details  Name: [name] [name] MRN: [id number] Date of Birth: [birth year]  Transition of Care [facility]) CM/SW Contact  Shelbie Hutching, RN Phone Number: 09/19/2021, 1:04 PM  Clinical Narrative:    Day 10 of COVID isolation is tomorrow.  Patient does have bed offers from [facility] and [facility] in [place].  Message left for granddaughter, Iran Ouch, to discuss discharge planning.  Family voiced that they would like patient to not go back to [facility].     Expected Discharge Plan: [place] Barriers to Discharge: Continued Medical Work up  Expected Discharge Plan and Services Expected Discharge Plan: [place] Choice: [place] arrangements for the past 2 months: [place]                                       Social Determinants of Health (SDOH) Interventions    Readmission Risk Interventions Readmission Risk Prevention Plan 09/12/2021  Transportation Screening Complete  Medication Review Press photographer) Complete  SW Recovery Care/Counseling Consult Complete  Palliative Care Screening Complete  Skilled Nursing Facility Complete  Some recent data might be hidden

## 2021-09-19 NOTE — Progress Notes (Signed)
PROGRESS NOTE    [name] [name]  DHR:[id number] DOB: 1929/12/30 DOA: 09/10/2021 PCP: Beckie Salts, MD    Brief Narrative:  [name] [name] is a 87 y.o. female with medical history significant for dementia, diabetes mellitus with complications of stage IV chronic kidney disease, hypertension and dyslipidemia who presents to the emergency room from the skilled nursing facility where she resides for evaluation of change in mental status and increased somnolence. She was found to have COVID, chest x-ray showed a central pulmonary vessel prominence.  Patient was also found to have abnormal urine, placed on Rocephin for UTI. Patient has completed antibiotics, however, patient had persistent hypoglycemia requiring D10 since admission.  11/10 no overnight issues.  Ate breakfast.   Assessment & Plan:   Principal Problem:   Acute metabolic encephalopathy Active Problems:   Acute lower UTI   Sepsis (HCC)   HTN (hypertension)   CKD stage 4 due to type 2 diabetes mellitus (HCC)   Dementia with behavioral disturbance   COVID-19 virus infection   Hyperkalemia   Bradycardia   UTI (urinary tract infection)   Pressure injury of skin  Type 2 diabetes with hypoglycemia. Anorexia. Patient is given megestrol, prednisone and D10.  Glucose finally appears to be better.  Prednisone dose as yesterday increased to 20 mg twice a day.  D10 was discontinued 11/10 apparently eating better now. Will discontinue steroids  Per nursing she ate her breakfast and part of her lunch We will continue to monitor   Sepsis is secondary to UTI. Urinary tract infection. Acute metabolic encephalopathy secondary to UTI and COVID. Sinus bradycardia. Condition had improved.  Antibiotics completed.   1/10 patient has baseline dementia Haldol for agitation as needed    Chronic kidney disease stage IV. Hyperkalemia. Given Kayexalate now K stable at 4.2    Pancytopenia. COVID infection. Stable continue to monitor  10-day quarantine ends on 11/11      Patient has been seen by palliative care, at this point, family does not accept complete care.  They want the patient to be placed.   DVT prophylaxis: scd Code Status: full Family Communication:  Disposition Plan: Placement pending     Status is: Inpatient   Remains inpatient appropriate because: Due to severity of disease, IV fluids         I/O last 3 completed shifts: In: 200 [P.O.:200] Out: 1750 [Urine:1750] Total I/O In: 200 [P.O.:200] Out: 540 [Urine:540]      Subjective: Patient denies shortness of breath, chest pain, dizziness or abdominal pain  Objective: Vitals:   09/18/21 1939 09/19/21 0552 09/19/21 0725 09/19/21 1152  BP: 132/70 135/73 134/74 120/69  Pulse: 76 (!) 58 78 79  Resp: 18 17 18 18   Temp: 97.8 F (36.6 C) 98.1 F (36.7 C) 98.2 F (36.8 C) 98 F (36.7 C)  TempSrc: Oral   Oral  SpO2: 96% 100% 98% 100%  Weight:      Height:        Intake/Output Summary (Last 24 hours) at 09/19/2021 1351 Last data filed at 09/19/2021 0900 Gross per 24 hour  Intake 300 ml  Output 1790 ml  Net -1490 ml   Filed Weights   09/10/21 1028 09/14/21 0600  Weight: 85 kg 89.9 kg    Examination: NAD, calm decreased breath sounds no wheezing Regular S1-S2 no gallops Soft benign positive bowel sounds No edema Mood and affect appropriate in current setting    Data Reviewed: I have personally reviewed following labs and imaging studies  CBC:  Recent Labs  Lab 09/13/21 0029 09/14/21 1025 09/15/21 0615 09/17/21 0556  WBC 3.6* 4.1 4.9 9.1  NEUTROABS 1.6* 2.1 3.1 5.9  HGB 7.9* 8.5* 9.2* 8.5*  HCT 26.9* 28.2* 29.7* 26.8*  MCV 102.7* 101.8* 100.3* 94.7  PLT 122* 122* 136* 850   Basic Metabolic Panel: Recent Labs  Lab 09/13/21 0029 09/14/21 1025 09/15/21 0615 09/16/21 0547 09/17/21 0556 09/18/21 0522 09/19/21 1035  NA 142 140 139 140 139 140  --   K 4.8 5.3* 5.6* 5.1 5.0 5.1 4.2  CL 113* 112* 111 108  108 108  --   CO2 26 22 24 27 24 26   --   GLUCOSE 100* 94 104* 78 155* 117*  --   BUN 34* 31* 30* 33* 33* 37*  --   CREATININE 1.82* 1.67* 1.44* 1.53* 1.90* 1.83* 1.70*  CALCIUM 8.7* 8.9 9.3 9.4 9.1 9.0  --   MG 1.6* 1.9 2.0  --   --   --   --   PHOS 3.7  --   --   --   --   --   --    GFR: Estimated Creatinine Clearance: 24.9 mL/min (A) (by C-G formula based on SCr of 1.7 mg/dL (H)). Liver Function Tests: No results for input(s): AST, ALT, ALKPHOS, BILITOT, PROT, ALBUMIN in the last 168 hours.  No results for input(s): LIPASE, AMYLASE in the last 168 hours. No results for input(s): AMMONIA in the last 168 hours. Coagulation Profile: No results for input(s): INR, PROTIME in the last 168 hours. Cardiac Enzymes: No results for input(s): CKTOTAL, CKMB, CKMBINDEX, TROPONINI in the last 168 hours. BNP (last 3 results) No results for input(s): PROBNP in the last 8760 hours. HbA1C: No results for input(s): HGBA1C in the last 72 hours. CBG: Recent Labs  Lab 09/18/21 1931 09/18/21 2319 09/19/21 0359 09/19/21 0757 09/19/21 1204  GLUCAP 169* 208* 129* 260* 174*   Lipid Profile: No results for input(s): CHOL, HDL, LDLCALC, TRIG, CHOLHDL, LDLDIRECT in the last 72 hours. Thyroid Function Tests: No results for input(s): TSH, T4TOTAL, FREET4, T3FREE, THYROIDAB in the last 72 hours. Anemia Panel: No results for input(s): VITAMINB12, FOLATE, FERRITIN, TIBC, IRON, RETICCTPCT in the last 72 hours. Sepsis Labs: Recent Labs  Lab 09/13/21 0029  PROCALCITON <0.10    Recent Results (from the past 240 hour(s))  Blood Culture (routine x 2)     Status: None   Collection Time: 09/10/21 11:05 AM   Specimen: BLOOD  Result Value Ref Range Status   Specimen Description BLOOD BLOOD RIGHT FOREARM  Final   Special Requests   Final    BOTTLES DRAWN AEROBIC AND ANAEROBIC Blood Culture adequate volume   Culture   Final    NO GROWTH 5 DAYS Performed at [facility], [address]  [place], [place] [address]    Report Status 09/15/2021 FINAL  Final  Resp Panel by RT-PCR (Flu A&B, Covid) In/Out Cath Urine     Status: Abnormal   Collection Time: 09/10/21 11:37 AM   Specimen: In/Out Cath Urine; Nasopharyngeal(NP) swabs in vial transport medium  Result Value Ref Range Status   SARS Coronavirus 2 by RT PCR POSITIVE (A) NEGATIVE Final    Comment: RESULT CALLED TO, READ BACK BY AND VERIFIED WITH: JENNIFER BELCHER 09/10/21 1313 KLW (NOTE) SARS-CoV-2 target nucleic acids are DETECTED.  The SARS-CoV-2 RNA is generally detectable in upper respiratory specimens during the acute phase of infection. Positive results are indicative of the presence of the  identified virus, but do not rule out bacterial infection or co-infection with other pathogens not detected by the test. Clinical correlation with patient history and other diagnostic information is necessary to determine patient infection status. The expected result is Negative.  Fact Sheet for Patients: EntrepreneurPulse.com.au  Fact Sheet for Healthcare Providers: IncredibleEmployment.be  This test is not yet approved or cleared by the [place] FDA and  has been authorized for detection and/or diagnosis of SARS-CoV-2 by FDA under an Emergency Use Authorization (EUA).  This EUA will remain in effect (meaning this test can be  used) for the duration of  the COVID-19 declaration under Section 564(b)(1) of the Act, 21 U.S.C. section 360bbb-3(b)(1), unless the authorization is terminated or revoked sooner.     Influenza A by PCR NEGATIVE NEGATIVE Final   Influenza B by PCR NEGATIVE NEGATIVE Final    Comment: (NOTE) The Xpert Xpress SARS-CoV-2/FLU/RSV plus assay is intended as an aid in the diagnosis of influenza from Nasopharyngeal swab specimens and should not be used as a sole basis for treatment. Nasal washings and aspirates are unacceptable for Xpert Xpress  SARS-CoV-2/FLU/RSV testing.  Fact Sheet for Patients: EntrepreneurPulse.com.au  Fact Sheet for Healthcare Providers: IncredibleEmployment.be  This test is not yet approved or cleared by the [place] FDA and has been authorized for detection and/or diagnosis of SARS-CoV-2 by FDA under an Emergency Use Authorization (EUA). This EUA will remain in effect (meaning this test can be used) for the duration of the COVID-19 declaration under Section 564(b)(1) of the Act, 21 U.S.C. section 360bbb-3(b)(1), unless the authorization is terminated or revoked.  Performed at [facility], [address] [place], [place] [address]   Blood Culture (routine x 2)     Status: None   Collection Time: 09/10/21 11:37 AM   Specimen: BLOOD  Result Value Ref Range Status   Specimen Description BLOOD BLOOD RIGHT HAND  Final   Special Requests   Final    BOTTLES DRAWN AEROBIC AND ANAEROBIC Blood Culture results may not be optimal due to an inadequate volume of blood received in culture bottles   Culture   Final    NO GROWTH 5 DAYS Performed at [facility], [address], [place], [place] [address]    Report Status 09/15/2021 FINAL  Final  Urine Culture     Status: Abnormal   Collection Time: 09/10/21  2:24 PM   Specimen: In/Out Cath Urine  Result Value Ref Range Status   Specimen Description   Final    IN/OUT CATH URINE Performed at [facility], [address] [place], [place] [address]    Special Requests   Final    NONE Performed at [facility], [address] [place], [place] [address]    Culture MULTIPLE SPECIES PRESENT, SUGGEST RECOLLECTION (A)  Final   Report Status 09/11/2021 FINAL  Final         Radiology Studies: No results found.      Scheduled Meds:  vitamin C  500 mg Oral Daily   megestrol  400 mg Oral Daily   zinc sulfate  220 mg Oral Daily   Continuous Infusions:   LOS: 9 days     Time spent: 35 minutes with more than 50% on COC    Nolberto Hanlon, MD [facility]   To contact the attending provider between 7A-7P or the covering provider during after hours 7P-7A, please log into the web site [email or web address] and access using universal [place] password  for that web site. If you do not have the password, please call the hospital operator.  09/19/2021, 1:51 PM

## 2021-09-20 DIAGNOSIS — G9341 Metabolic encephalopathy: Secondary | ICD-10-CM | POA: Diagnosis not present

## 2021-09-20 LAB — GLUCOSE, CAPILLARY
Glucose-Capillary: 151 mg/dL — ABNORMAL HIGH (ref 70–99)
Glucose-Capillary: 180 mg/dL — ABNORMAL HIGH (ref 70–99)

## 2021-09-20 MED ORDER — ASCORBIC ACID 500 MG PO TABS: 500.0000 mg | ORAL_TABLET | Freq: Every day | ORAL | Status: DC

## 2021-09-20 MED ORDER — MEGESTROL ACETATE 400 MG/10ML PO SUSP
400.0000 mg | Freq: Every day | ORAL | 0 refills | Status: DC
Start: 2021-09-21 — End: 2022-04-05

## 2021-09-20 MED ORDER — AMLODIPINE BESYLATE 2.5 MG PO TABS
2.5000 mg | ORAL_TABLET | Freq: Every day | ORAL | Status: DC
Start: 2021-09-20 — End: 2021-10-08

## 2021-09-20 MED ORDER — ZINC SULFATE 220 (50 ZN) MG PO CAPS: 220.0000 mg | ORAL_CAPSULE | Freq: Every day | ORAL | Status: DC

## 2021-09-20 MED ORDER — AMLODIPINE BESYLATE 5 MG PO TABS
2.5000 mg | ORAL_TABLET | Freq: Every day | ORAL | Status: DC
  Administered 2021-09-20: 2.5 mg via ORAL
  Filled 2021-09-20: qty 1

## 2021-09-20 MED ORDER — ACETAMINOPHEN 325 MG PO TABS: 650.0000 mg | ORAL_TABLET | Freq: Four times a day (QID) | ORAL | Status: AC | PRN

## 2021-09-20 NOTE — TOC Progression Note (Signed)
Transition of Care [facility]) - Progression Note    Patient Details  Name: [name] [name] MRN: [id number] Date of Birth: 03/21/1930  Transition of Care [facility]) CM/SW Contact  Shelbie Hutching, RN Phone Number: 09/20/2021, 12:28 PM  Clinical Narrative:    Granddaughter has changed her mind and does not want patient to go to [facility].  [facility] because [facility] have a memory care unit.  [facility] asks that PT work with patient and we try to get auth for some rehab.     Expected Discharge Plan: [place] Barriers to Discharge: Continued Medical Work up  Expected Discharge Plan and Services Expected Discharge Plan: [place] Choice: [place] arrangements for the past 2 months: [place] Expected Discharge Date: 09/20/21                                     Social Determinants of Health (SDOH) Interventions    Readmission Risk Interventions Readmission Risk Prevention Plan 09/12/2021  Transportation Screening Complete  Medication Review Press photographer) Complete  SW Recovery Care/Counseling Consult Complete  Palliative Care Screening Complete  Skilled Nursing Facility Complete  Some recent data might be hidden

## 2021-09-20 NOTE — TOC Progression Note (Signed)
Transition of Care [facility]) - Progression Note    Patient Details  Name: Amany Rando MRN: [id number] Date of Birth: 1929/12/17  Transition of Care [facility]) CM/SW Contact  Shelbie Hutching, RN Phone Number: 09/20/2021, 1:12 PM  Clinical Narrative:    [name] does have a locked memory care unit and so Iran Ouch is on board with patient going to [facility]- it is closer than [place].     Expected Discharge Plan: [place] Barriers to Discharge: Continued Medical Work up  Expected Discharge Plan and Services Expected Discharge Plan: [place] Choice: [place] arrangements for the past 2 months: [place] Expected Discharge Date: 09/20/21                                     Social Determinants of Health (SDOH) Interventions    Readmission Risk Interventions Readmission Risk Prevention Plan 09/12/2021  Transportation Screening Complete  Medication Review Press photographer) Complete  SW Recovery Care/Counseling Consult Complete  Palliative Care Screening Complete  Skilled Nursing Facility Complete  Some recent data might be hidden

## 2021-09-20 NOTE — TOC Transition Note (Signed)
Transition of Care [facility]) - CM/SW Discharge Note   Patient Details  Name: [name] [name] MRN: [id number] Date of Birth: 20-Apr-1930  Transition of Care [facility] CM/SW Contact:  Shelbie Hutching, RN Phone Number: 09/20/2021, 1:32 PM   Clinical Narrative:    MD is currently working on discharge summary for patient to go to [facility].  Patient will go to [facility], bedside RN will call report to [phone].  RNCM has arranged EMS and patient is 2nd on the list for pick up.     Final next level of care: Skilled Nursing Facility Barriers to Discharge: Barriers Resolved   Patient Goals and CMS Choice Patient states their goals for this hospitalization and ongoing recovery are:: Granddaughter agrees with patient going to [facility] Dementia unit. CMS Medicare.gov Compare Post Acute Care list provided to:: Patient Represenative (must comment) Choice offered to / list presented to : Adult Children  Discharge Placement   Existing PASRR number confirmed : 09/20/21          Patient chooses bed at: [facility] Patient to be transferred to facility by: [facility] Name of family member notified: [name] Patient and family notified of of transfer: 09/20/21  Discharge Plan and Services     Post Acute Care Choice: [name]          DME Arranged: N/A DME Agency: NA       HH Arranged: NA HH Agency: NA        Social Determinants of Health (SDOH) Interventions     Readmission Risk Interventions Readmission Risk Prevention Plan 09/12/2021  Transportation Screening Complete  Medication Review Press photographer) Complete  SW Recovery Care/Counseling Consult Complete  Palliative Care Screening Complete  Skilled Nursing Facility Complete  Some recent data might be hidden

## 2021-09-20 NOTE — Care Management Important Message (Signed)
Important Message  Patient Details  Name: [name] [name] MRN: [id number] Date of Birth: 10/30/1930   Medicare Important Message Given:  Other (see comment)  Left a message for granddaughter, [name] [name] [phone] asking her to call me at her convenience so I could review the Important Message from Medicare.  Will await a return call.    [name] 09/20/2021, 1:24 PM

## 2021-09-20 NOTE — Discharge Summary (Signed)
[name] [name] STM:[id number] DOB: November 23, 1929 DOA: 09/10/2021  PCP: Beckie Salts, MD  Admit date: 09/10/2021 Discharge date: 09/20/2021  Admitted From: SNF Disposition:  SNF  Recommendations for Outpatient Follow-up:  Follow up with PCP in 1 week Please obtain BMP/CBC in one week    Discharge Condition:Stable CODE STATUS:DNR  Diet recommendation: Dysphagia 1  Extra Gravy on meats, potatoes.  Cream Soups. Yogurt, pudding. May have Oatmeal per Speech w/ butter/sugar  Brief/Interim Summary: Per HPI: [name] [name] is a 87 y.o. female with medical history significant for dementia, diabetes mellitus with complications of stage IV chronic kidney disease, hypertension and dyslipidemia who presents to the emergency room from the skilled nursing facility where she resides for evaluation of change in mental status and increased somnolence.She was found to have COVID, chest x-ray showed a central pulmonary vessel prominence.  Patient was also found to have abnormal urine, placed on Rocephin for UTI. Patient has completed antibiotics, however, patient had persistent hypoglycemia requiring D10 , now discontinued as her appetite has improved.   Type 2 diabetes with hypoglycemia. Anorexia. Patient is given megestrol Was placed on prednisone and D10 initially Now appetite improving     Sepsis is secondary to UTI. Urinary tract infection. Acute metabolic encephalopathy secondary to UTI and COVID. Sinus bradycardia. Condition had improved.  Antibiotics completed.   Patient at baseline dementia      Chronic kidney disease stage IV. Hyperkalemia. Given Kayexalate now K stable at 4.2       Pancytopenia. COVID infection. Stable  Completed 10-day quarantine ends on 11/11      Essential Hypertention Add amlodipine 2.5mg  qd, increase as warranted  Discharge Diagnoses:  Principal Problem:   Acute metabolic encephalopathy Active Problems:   Acute lower UTI   Sepsis ([place])   HTN  (hypertension)   CKD stage 4 due to type 2 diabetes mellitus (HCC)   Dementia with behavioral disturbance   COVID-19 virus infection   Hyperkalemia   Bradycardia   UTI (urinary tract infection)   Pressure injury of skin    Discharge Instructions  Discharge Instructions     Call MD for:  temperature >100.4   Complete by: As directed    Diet - low sodium heart healthy   Complete by: As directed    Discharge wound care:   Complete by: As directed    As above   Increase activity slowly   Complete by: As directed       Allergies as of 09/20/2021       Reactions   Alendronate    Other reaction(s): Other (See Comments) Poor renal function.        Medication List     STOP taking these medications    citalopram 20 MG tablet Commonly known as: CELEXA   furosemide 20 MG tablet Commonly known as: LASIX   OLANZapine 2.5 MG tablet Commonly known as: ZYPREXA   traZODone 100 MG tablet Commonly known as: DESYREL       TAKE these medications    acetaminophen 325 MG tablet Commonly known as: TYLENOL Take 2 tablets (650 mg total) by mouth every 6 (six) hours as needed for mild pain (or Fever >/= 101). What changed:  when to take this reasons to take this   amLODipine 2.5 MG tablet Commonly known as: NORVASC Take 1 tablet (2.5 mg total) by mouth daily.   ascorbic acid 500 MG tablet Commonly known as: VITAMIN C Take 1 tablet (500 mg total) by mouth daily. Start taking on:  September 21, 2021   atorvastatin 20 MG tablet Commonly known as: LIPITOR Take 20 mg by mouth daily.   megestrol 400 MG/10ML suspension Commonly known as: MEGACE Take 10 mLs (400 mg total) by mouth daily. Start taking on: September 21, 2021   pantoprazole 40 MG tablet Commonly known as: PROTONIX Take 40 mg by mouth daily.   Vitamin D (Ergocalciferol) 1.25 MG (50000 UNIT) Caps capsule Commonly known as: DRISDOL Take 50,000 Units by mouth once a week.   zinc sulfate 220 (50 Zn) MG  capsule Take 1 capsule (220 mg total) by mouth daily. Start taking on: September 21, 2021               Discharge Care Instructions  (From admission, onward)           Start     Ordered   09/20/21 0000  Discharge wound care:       Comments: As above   09/20/21 1211            Contact information for follow-up providers     Beckie Salts, MD Follow up in 1 week(s).   Specialty: Internal Medicine             Contact information for after-discharge care     [facility] .   Service: Skilled Nursing Contact information: [place] [address] [phone]                    Allergies  Allergen Reactions   Alendronate     Other reaction(s): Other (See Comments) Poor renal function.    Consultations:    Procedures/Studies: CT HEAD WO CONTRAST (5MM)  Result Date: 09/10/2021 CLINICAL DATA:  Altered mental status EXAM: CT HEAD WITHOUT CONTRAST TECHNIQUE: Contiguous axial images were obtained from the base of the skull through the vertex without intravenous contrast. COMPARISON:  08/18/2021 FINDINGS: Brain: No evidence of acute infarction, hemorrhage, hydrocephalus, extra-axial collection or mass lesion/mass effect. Periventricular and deep white matter hypodensity. Vascular: No hyperdense vessel or unexpected calcification. Skull: Normal. Negative for fracture or focal lesion. Sinuses/Orbits: No acute finding. Other: None. IMPRESSION: No acute intracranial pathology. Small-vessel white matter disease. Electronically Signed   By: Delanna Ahmadi M.D.   On: 09/10/2021 13:55   CT CHEST WO CONTRAST  Result Date: 09/10/2021 CLINICAL DATA:  Pneumonia, effusion or abscess suspected, xray done EXAM: CT CHEST WITHOUT CONTRAST TECHNIQUE: Multidetector CT imaging of the chest was performed following the standard protocol without IV contrast. COMPARISON:  Chest radiograph same day, chest CT 07/10/2021  FINDINGS: Cardiovascular: Normal cardiac size.No pericardial disease.Normal size main and branch pulmonary arteries.Mild atherosclerotic calcifications of the thoracic aorta. The ascending aorta measures up to 4.0 cm, unchanged. Mediastinum/Nodes: No lymphadenopathy.The thyroid is unremarkable.Esophagus is unremarkable.The trachea is unremarkable. Lungs/Pleura: There are small bilateral pleural effusions, new since the prior exam. There are multiple bilateral pulmonary nodules and areas tree-in-bud nodularity, similar to prior exam, largest nodule measuring 8 mm in the right lower lobe (series 4, image 73), unchanged. No pneumothorax. Bibasilar atelectasis. Upper Abdomen: Prior cholecystectomy.  No acute abnormality. Musculoskeletal: Chronic posttraumatic deformity of the left proximal humerus with likely pseudoarthrosis, partially visualized, and with unchanged multiple retained foreign bodies.There are subacute fractures of the left anterior third, fifth, and sixth ribs. There are new acute left anterolateral eighth and ninth rib fractures. IMPRESSION: New acute left anterolateral eighth and ninth rib fractures. Subacute left anterior third, fifth, and  sixth rib fractures. New small bilateral pleural effusions. Similar diffuse scattered pulmonary nodules and tree-in-bud nodularity bilaterally, largest nodule measuring 8 mm in the right lower lobe, possibly representing a chronic atypical infectious process as seen on multiple prior exams. Stable size of the ascending aorta, measuring 4.0 cm. Recommend annual imaging followup by CTA or MRA. This recommendation follows 2010 ACCF/AHA/AATS/ACR/ASA/SCA/SCAI/SIR/STS/SVM Guidelines for the Diagnosis and Management of Patients with Thoracic Aortic Disease. Circulation.2010; 121: N829-F621. Aortic aneurysm NOS (ICD10-I71.9) Aortic Atherosclerosis (ICD10-I70.0). 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 Electronically Signed   By: Maurine Simmering M.D.   On:  09/10/2021 14:10   US Venous Img Lower Bilateral  Result Date: 08/31/2021 CLINICAL DATA:  Bilateral lower extremity edema for 1 week EXAM: BILATERAL LOWER EXTREMITY VENOUS DOPPLER ULTRASOUND TECHNIQUE: Gray-scale sonography with graded compression, as well as color Doppler and duplex ultrasound were performed to evaluate the lower extremity deep venous systems from the level of the common femoral vein and including the common femoral, femoral, profunda femoral, popliteal and calf veins including the posterior tibial, peroneal and gastrocnemius veins when visible. The superficial great saphenous vein was also interrogated. Spectral Doppler was utilized to evaluate flow at rest and with distal augmentation maneuvers in the common femoral, femoral and popliteal veins. COMPARISON:  None. FINDINGS: RIGHT LOWER EXTREMITY Common Femoral Vein: No evidence of thrombus. Normal compressibility, respiratory phasicity and response to augmentation. Saphenofemoral Junction: No evidence of thrombus. Normal compressibility and flow on color Doppler imaging. Profunda Femoral Vein: No evidence of thrombus. Normal compressibility and flow on color Doppler imaging. Femoral Vein: No evidence of thrombus. Normal compressibility, respiratory phasicity and response to augmentation. Popliteal Vein: No evidence of thrombus. Normal compressibility, respiratory phasicity and response to augmentation. Calf Veins: No evidence of thrombus. Normal compressibility and flow on color Doppler imaging. Superficial Great Saphenous Vein: No evidence of thrombus. Normal compressibility. Venous Reflux:  None. Other Findings: Superficial subcutaneous edema behind the knee and in the proximal calf. LEFT LOWER EXTREMITY Common Femoral Vein: No evidence of thrombus. Normal compressibility, respiratory phasicity and response to augmentation. Saphenofemoral Junction: No evidence of thrombus. Normal compressibility and flow on color Doppler imaging. Profunda  Femoral Vein: No evidence of thrombus. Normal compressibility and flow on color Doppler imaging. Femoral Vein: No evidence of thrombus. Normal compressibility, respiratory phasicity and response to augmentation. Popliteal Vein: No evidence of thrombus. Normal compressibility, respiratory phasicity and response to augmentation. Calf Veins: No evidence of thrombus. Normal compressibility and flow on color Doppler imaging. Superficial Great Saphenous Vein: No evidence of thrombus. Normal compressibility. Venous Reflux:  None. Other Findings: Superficial subcutaneous edema behind the knee and in the proximal calf. IMPRESSION: No evidence of deep venous thrombosis in either lower extremity. Electronically Signed   By: Jacqulynn Cadet M.D.   On: 08/31/2021 06:39   US Venous Img Upper Uni Right(DVT)  Result Date: 08/31/2021 CLINICAL DATA:  Right upper extremity pain x5 days. EXAM: RIGHT UPPER EXTREMITY VENOUS DOPPLER ULTRASOUND TECHNIQUE: Gray-scale sonography with graded compression, as well as color Doppler and duplex ultrasound were performed to evaluate the upper extremity deep venous system from the level of the subclavian vein and including the jugular, axillary, basilic, radial, ulnar and upper cephalic vein. Spectral Doppler was utilized to evaluate flow at rest and with distal augmentation maneuvers. COMPARISON:  None. FINDINGS: Contralateral Subclavian Vein: Respiratory phasicity is normal and symmetric with the symptomatic side. No evidence of thrombus. Normal compressibility. Internal Jugular Vein: No evidence of thrombus. Normal compressibility, respiratory phasicity  and response to augmentation. Subclavian Vein: No evidence of thrombus. Normal compressibility, respiratory phasicity and response to augmentation. Axillary Vein: No evidence of thrombus. Normal compressibility, respiratory phasicity and response to augmentation. Cephalic Vein: No evidence of thrombus. Normal compressibility, respiratory  phasicity and response to augmentation. Basilic Vein: No evidence of thrombus. Normal compressibility, respiratory phasicity and response to augmentation. Brachial Veins: No evidence of thrombus. Normal compressibility, respiratory phasicity and response to augmentation. Radial Veins: No evidence of thrombus. Normal compressibility, respiratory phasicity and response to augmentation. Ulnar Veins: No evidence of thrombus. Normal compressibility, respiratory phasicity and response to augmentation. Venous Reflux:  None visualized. Other Findings:  None visualized. IMPRESSION: No evidence of DVT within the RIGHT upper extremity. Electronically Signed   By: Virgina Norfolk M.D.   On: 08/31/2021 00:30   DG Chest Port 1 View  Result Date: 09/10/2021 CLINICAL DATA:  Difficulty breathing EXAM: PORTABLE CHEST 1 VIEW COMPARISON:  Previous radiographs including the examination of 06/18/2021 and CT done on 07/10/2021 FINDINGS: Transverse diameter of heart is increased. Central pulmonary vessels are more prominent. Increased interstitial markings are seen in the parahilar regions and lower lung fields. There is no focal consolidation. There is minimal blunting of left lateral CP angle. There is no pneumothorax. 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 IMPRESSION: Cardiomegaly. Central pulmonary vessels are more prominent suggesting CHF. There is diffuse increase in interstitial markings in both lungs, more so on the right side suggesting asymmetric pulmonary edema. Possibility of underlying pneumonia is not excluded. Small left pleural effusion. Electronically Signed   By: Elmer Picker M.D.   On: 09/10/2021 11:51      Subjective: No sob, cp, dizziness  Discharge Exam: Vitals:   09/20/21 0939 09/20/21 1200  BP: (!) 143/56 (!) 161/76  Pulse: 69 85  Resp: 18 17  Temp: 98.5 F (36.9 C) 99.1 F (37.3 C)  SpO2: 98% 99%   Vitals:   09/19/21 2034  09/20/21 0001 09/20/21 0939 09/20/21 1200  BP: 138/70 (!) 141/58 (!) 143/56 (!) 161/76  Pulse: 80 79 69 85  Resp: 16 16 18 17   Temp: 99.2 F (37.3 C)  98.5 F (36.9 C) 99.1 F (37.3 C)  TempSrc:   Oral   SpO2: 100% 97% 98% 99%  Weight:      Height:        General: Pt is alert, awake, not in acute distress Cardiovascular: RRR, S1/S2 +, no rubs, no gallops Respiratory: CTA bilaterally, no wheezing, no rhonchi Abdominal: Soft, NT, ND, bowel sounds + Extremities: no edema, no cyanosis    The results of significant diagnostics from this hospitalization (including imaging, microbiology, ancillary and laboratory) are listed below for reference.     Microbiology: Recent Results (from the past 240 hour(s))  Urine Culture     Status: Abnormal   Collection Time: 09/10/21  2:24 PM   Specimen: In/Out Cath Urine  Result Value Ref Range Status   Specimen Description   Final    IN/OUT CATH URINE Performed at [facility], [address], [place], [place] [address]    Special Requests   Final    NONE Performed at [facility], [address] [place], [place] [address]    Culture MULTIPLE SPECIES PRESENT, SUGGEST RECOLLECTION (A)  Final   Report Status 09/11/2021 FINAL  Final     Labs: BNP (last 3 results) Recent Labs    08/30/21 2118  BNP 65.9  Basic Metabolic Panel: Recent Labs  Lab 09/14/21 1025 09/15/21 0615 09/16/21 0547 09/17/21 0556 09/18/21 0522 09/19/21 1035  NA 140 139 140 139 140  --   K 5.3* 5.6* 5.1 5.0 5.1 4.2  CL 112* 111 108 108 108  --   CO2 22 24 27 24 26   --   GLUCOSE 94 104* 78 155* 117*  --   BUN 31* 30* 33* 33* 37*  --   CREATININE 1.67* 1.44* 1.53* 1.90* 1.83* 1.70*  CALCIUM 8.9 9.3 9.4 9.1 9.0  --   MG 1.9 2.0  --   --   --   --    Liver Function Tests: No results for input(s): AST, ALT, ALKPHOS, BILITOT, PROT, ALBUMIN in the last 168 hours. No results for input(s): LIPASE, AMYLASE in the last 168 hours. No results  for input(s): AMMONIA in the last 168 hours. CBC: Recent Labs  Lab 09/14/21 1025 09/15/21 0615 09/17/21 0556  WBC 4.1 4.9 9.1  NEUTROABS 2.1 3.1 5.9  HGB 8.5* 9.2* 8.5*  HCT 28.2* 29.7* 26.8*  MCV 101.8* 100.3* 94.7  PLT 122* 136* 157   Cardiac Enzymes: No results for input(s): CKTOTAL, CKMB, CKMBINDEX, TROPONINI in the last 168 hours. BNP: Invalid input(s): POCBNP CBG: Recent Labs  Lab 09/19/21 1204 09/19/21 1649 09/19/21 2035 09/20/21 0001 09/20/21 1159  GLUCAP 174* 180* 179* 180* 151*   D-Dimer No results for input(s): DDIMER in the last 72 hours. Hgb A1c No results for input(s): HGBA1C in the last 72 hours. Lipid Profile No results for input(s): CHOL, HDL, LDLCALC, TRIG, CHOLHDL, LDLDIRECT in the last 72 hours. Thyroid function studies No results for input(s): TSH, T4TOTAL, T3FREE, THYROIDAB in the last 72 hours.  Invalid input(s): FREET3 Anemia work up No results for input(s): VITAMINB12, FOLATE, FERRITIN, TIBC, IRON, RETICCTPCT in the last 72 hours. Urinalysis    Component Value Date/Time   COLORURINE YELLOW (A) 09/10/2021 1424   APPEARANCEUR CLEAR (A) 09/10/2021 1424   LABSPEC 1.012 09/10/2021 1424   PHURINE 5.0 09/10/2021 1424   GLUCOSEU NEGATIVE 09/10/2021 1424   HGBUR NEGATIVE 09/10/2021 [place] 09/10/2021 1424   KETONESUR NEGATIVE 09/10/2021 1424   PROTEINUR NEGATIVE 09/10/2021 1424   UROBILINOGEN 1.0 12/30/2014 1333   NITRITE POSITIVE (A) 09/10/2021 1424   LEUKOCYTESUR MODERATE (A) 09/10/2021 1424   Sepsis Labs Invalid input(s): PROCALCITONIN,  WBC,  LACTICIDVEN Microbiology Recent Results (from the past 240 hour(s))  Urine Culture     Status: Abnormal   Collection Time: 09/10/21  2:24 PM   Specimen: In/Out Cath Urine  Result Value Ref Range Status   Specimen Description   Final    IN/OUT CATH URINE Performed at [facility], [address], [place], [place] [address]    Special Requests   Final     NONE Performed at [facility], [address] [place], [place] [address]    Culture MULTIPLE SPECIES PRESENT, SUGGEST RECOLLECTION (A)  Final   Report Status 09/11/2021 FINAL  Final     Time coordinating discharge: Over 30 minutes  SIGNED:   Nolberto Hanlon, MD  [facility] 09/20/2021, 1:22 PM Pager   If 7PM-7AM, please contact night-coverage [email or web address] Password [email or web address]

## 2021-09-20 NOTE — TOC Progression Note (Signed)
Transition of Care [facility]) - Progression Note    Patient Details  Name: [name] [name] MRN: [id number] Date of Birth: 1929-12-23  [facility]) CM/SW Contact  Shelbie Hutching, RN Phone Number: 09/20/2021, 10:03 AM  Clinical Narrative:    Patient has several bed offers, granddaughter Iran Ouch chooses [facility], she reports that the patient has been there in the past.  [name] can accept patient today, will discuss with MD to ensure medical stability before discharge.     Expected Discharge Plan: [place] Barriers to Discharge: Continued Medical Work up  Expected Discharge Plan and Services Expected Discharge Plan: [place] Choice: [place] arrangements for the past 2 months: [place]                                       Social Determinants of Health (SDOH) Interventions    Readmission Risk Interventions Readmission Risk Prevention Plan 09/12/2021  Transportation Screening Complete  Medication Review Press photographer) Complete  SW Recovery Care/Counseling Consult Complete  Palliative Care Screening Complete  Skilled Nursing Facility Complete  Some recent data might be hidden

## 2021-09-26 ENCOUNTER — Emergency Department: Payer: Medicare Other

## 2021-09-26 ENCOUNTER — Other Ambulatory Visit: Payer: Self-pay

## 2021-09-26 ENCOUNTER — Inpatient Hospital Stay
Admission: EM | Admit: 2021-09-26 | Discharge: 2021-10-08 | DRG: 683 | Disposition: A | Discharge status: Skilled Nursing Facility | Payer: Medicare Other | Attending: Hospitalist | Admitting: Hospitalist

## 2021-09-26 DIAGNOSIS — Z6832 Body mass index (BMI) 32.0-32.9, adult: Secondary | ICD-10-CM

## 2021-09-26 DIAGNOSIS — Z79899 Other long term (current) drug therapy: Secondary | ICD-10-CM

## 2021-09-26 DIAGNOSIS — J111 Influenza due to unidentified influenza virus with other respiratory manifestations: Secondary | ICD-10-CM | POA: Diagnosis not present

## 2021-09-26 DIAGNOSIS — F323 Major depressive disorder, single episode, severe with psychotic features: Secondary | ICD-10-CM | POA: Diagnosis present

## 2021-09-26 DIAGNOSIS — Z7689 Persons encountering health services in other specified circumstances: Secondary | ICD-10-CM

## 2021-09-26 DIAGNOSIS — I129 Hypertensive chronic kidney disease with stage 1 through stage 4 chronic kidney disease, or unspecified chronic kidney disease: Secondary | ICD-10-CM | POA: Diagnosis present

## 2021-09-26 DIAGNOSIS — R54 Age-related physical debility: Secondary | ICD-10-CM | POA: Diagnosis present

## 2021-09-26 DIAGNOSIS — F03C3 Unspecified dementia, severe, with mood disturbance: Secondary | ICD-10-CM | POA: Diagnosis present

## 2021-09-26 DIAGNOSIS — R509 Fever, unspecified: Secondary | ICD-10-CM

## 2021-09-26 DIAGNOSIS — E785 Hyperlipidemia, unspecified: Secondary | ICD-10-CM | POA: Diagnosis present

## 2021-09-26 DIAGNOSIS — G9341 Metabolic encephalopathy: Secondary | ICD-10-CM | POA: Diagnosis present

## 2021-09-26 DIAGNOSIS — N179 Acute kidney failure, unspecified: Principal | ICD-10-CM | POA: Diagnosis present

## 2021-09-26 DIAGNOSIS — T7601XA Adult neglect or abandonment, suspected, initial encounter: Secondary | ICD-10-CM

## 2021-09-26 DIAGNOSIS — R531 Weakness: Secondary | ICD-10-CM

## 2021-09-26 DIAGNOSIS — R4182 Altered mental status, unspecified: Secondary | ICD-10-CM

## 2021-09-26 DIAGNOSIS — E875 Hyperkalemia: Secondary | ICD-10-CM | POA: Diagnosis present

## 2021-09-26 DIAGNOSIS — W19XXXA Unspecified fall, initial encounter: Secondary | ICD-10-CM

## 2021-09-26 DIAGNOSIS — R296 Repeated falls: Secondary | ICD-10-CM

## 2021-09-26 DIAGNOSIS — K219 Gastro-esophageal reflux disease without esophagitis: Secondary | ICD-10-CM | POA: Diagnosis present

## 2021-09-26 DIAGNOSIS — E119 Type 2 diabetes mellitus without complications: Secondary | ICD-10-CM

## 2021-09-26 DIAGNOSIS — R5381 Other malaise: Secondary | ICD-10-CM | POA: Diagnosis present

## 2021-09-26 DIAGNOSIS — E1122 Type 2 diabetes mellitus with diabetic chronic kidney disease: Secondary | ICD-10-CM | POA: Diagnosis present

## 2021-09-26 DIAGNOSIS — R0602 Shortness of breath: Secondary | ICD-10-CM | POA: Diagnosis not present

## 2021-09-26 DIAGNOSIS — N390 Urinary tract infection, site not specified: Secondary | ICD-10-CM | POA: Diagnosis present

## 2021-09-26 DIAGNOSIS — N184 Chronic kidney disease, stage 4 (severe): Secondary | ICD-10-CM | POA: Diagnosis present

## 2021-09-26 DIAGNOSIS — I440 Atrioventricular block, first degree: Secondary | ICD-10-CM | POA: Diagnosis present

## 2021-09-26 DIAGNOSIS — I10 Essential (primary) hypertension: Secondary | ICD-10-CM | POA: Diagnosis present

## 2021-09-26 DIAGNOSIS — F322 Major depressive disorder, single episode, severe without psychotic features: Secondary | ICD-10-CM | POA: Diagnosis present

## 2021-09-26 DIAGNOSIS — R55 Syncope and collapse: Secondary | ICD-10-CM | POA: Diagnosis present

## 2021-09-26 DIAGNOSIS — Z8616 Personal history of COVID-19: Secondary | ICD-10-CM

## 2021-09-26 DIAGNOSIS — T502X5A Adverse effect of carbonic-anhydrase inhibitors, benzothiadiazides and other diuretics, initial encounter: Secondary | ICD-10-CM | POA: Diagnosis present

## 2021-09-26 DIAGNOSIS — R001 Bradycardia, unspecified: Secondary | ICD-10-CM | POA: Diagnosis present

## 2021-09-26 DIAGNOSIS — Z9049 Acquired absence of other specified parts of digestive tract: Secondary | ICD-10-CM

## 2021-09-26 LAB — CBC
HCT: 32.3 % — ABNORMAL LOW (ref 36.0–46.0)
Hemoglobin: 9.8 g/dL — ABNORMAL LOW (ref 12.0–15.0)
MCH: 30.2 pg (ref 26.0–34.0)
MCHC: 30.3 g/dL (ref 30.0–36.0)
MCV: 99.7 fL (ref 80.0–100.0)
Platelets: 181 10*3/uL (ref 150–400)
RBC: 3.24 MIL/uL — ABNORMAL LOW (ref 3.87–5.11)
RDW: 15.5 % (ref 11.5–15.5)
WBC: 10.4 10*3/uL (ref 4.0–10.5)
nRBC: 0 % (ref 0.0–0.2)

## 2021-09-26 LAB — RESP PANEL BY RT-PCR (FLU A&B, COVID) ARPGX2
Influenza A by PCR: NEGATIVE
Influenza B by PCR: NEGATIVE
SARS Coronavirus 2 by RT PCR: POSITIVE — AB

## 2021-09-26 LAB — HEPATIC FUNCTION PANEL
ALT: 14 U/L (ref 0–44)
AST: 17 U/L (ref 15–41)
Albumin: 3.5 g/dL (ref 3.5–5.0)
Alkaline Phosphatase: 96 U/L (ref 38–126)
Bilirubin, Direct: 0.1 mg/dL (ref 0.0–0.2)
Indirect Bilirubin: 0.9 mg/dL (ref 0.3–0.9)
Total Bilirubin: 1 mg/dL (ref 0.3–1.2)
Total Protein: 8 g/dL (ref 6.5–8.1)

## 2021-09-26 LAB — BASIC METABOLIC PANEL
Anion gap: 7 (ref 5–15)
BUN: 19 mg/dL (ref 8–23)
CO2: 21 mmol/L — ABNORMAL LOW (ref 22–32)
Calcium: 9.1 mg/dL (ref 8.9–10.3)
Chloride: 110 mmol/L (ref 98–111)
Creatinine, Ser: 1.74 mg/dL — ABNORMAL HIGH (ref 0.44–1.00)
GFR, Estimated: 28 mL/min — ABNORMAL LOW (ref >60)
Glucose, Bld: 81 mg/dL (ref 70–99)
Potassium: 5.4 mmol/L — ABNORMAL HIGH (ref 3.5–5.1)
Sodium: 138 mmol/L (ref 135–145)

## 2021-09-26 MED ORDER — ASCORBIC ACID 500 MG PO TABS
500.0000 mg | ORAL_TABLET | Freq: Every day | ORAL | Status: DC
  Administered 2021-09-26 – 2021-10-08 (×13): 500 mg via ORAL
  Filled 2021-09-26 (×13): qty 1

## 2021-09-26 MED ORDER — AMLODIPINE BESYLATE 5 MG PO TABS
2.5000 mg | ORAL_TABLET | Freq: Every day | ORAL | Status: DC
  Administered 2021-09-27 – 2021-10-08 (×11): 2.5 mg via ORAL
  Filled 2021-09-26 (×12): qty 1

## 2021-09-26 MED ORDER — ACETAMINOPHEN 325 MG PO TABS
650.0000 mg | ORAL_TABLET | Freq: Four times a day (QID) | ORAL | Status: DC | PRN
  Administered 2021-09-28: 650 mg via ORAL
  Filled 2021-09-26: qty 2

## 2021-09-26 MED ORDER — ATORVASTATIN CALCIUM 20 MG PO TABS
20.0000 mg | ORAL_TABLET | Freq: Every day | ORAL | Status: DC
  Administered 2021-09-26 – 2021-10-08 (×13): 20 mg via ORAL
  Filled 2021-09-26 (×13): qty 1

## 2021-09-26 MED ORDER — PANTOPRAZOLE SODIUM 40 MG PO TBEC
40.0000 mg | DELAYED_RELEASE_TABLET | Freq: Every day | ORAL | Status: DC
  Administered 2021-09-26 – 2021-10-08 (×13): 40 mg via ORAL
  Filled 2021-09-26 (×13): qty 1

## 2021-09-26 MED ORDER — ZINC SULFATE 220 (50 ZN) MG PO CAPS
220.0000 mg | ORAL_CAPSULE | Freq: Every day | ORAL | Status: DC
  Administered 2021-09-26 – 2021-10-08 (×13): 220 mg via ORAL
  Filled 2021-09-26 (×13): qty 1

## 2021-09-26 MED ORDER — VITAMIN D (ERGOCALCIFEROL) 1.25 MG (50000 UNIT) PO CAPS
50000.0000 [IU] | ORAL_CAPSULE | ORAL | Status: DC
  Administered 2021-09-26 – 2021-10-03 (×2): 50000 [IU] via ORAL
  Filled 2021-09-26 (×2): qty 1

## 2021-09-26 MED ORDER — MEGESTROL ACETATE 400 MG/10ML PO SUSP
400.0000 mg | Freq: Every day | ORAL | Status: DC
  Administered 2021-09-26 – 2021-10-08 (×13): 400 mg via ORAL
  Filled 2021-09-26 (×14): qty 10

## 2021-09-26 NOTE — ED Notes (Signed)
X-ray at bedside

## 2021-09-26 NOTE — ED Notes (Signed)
Lab at bedside to draw blood. Pt eating crackers, applesauce, oranje juice and water.

## 2021-09-26 NOTE — ED Notes (Signed)
Pt expressing frustration that she does not want to be stuck in hospital and she wants to move in with her grandson Geralyn Flash and she does not want to stay in a nursing home or the hospital. Validated pts concerns/feelings.

## 2021-09-26 NOTE — ED Notes (Signed)
Pt provided a dinner tray.

## 2021-09-26 NOTE — ED Notes (Signed)
Provided [place] sandwich tray, applesauce, crackers. Pt resting comfortably.

## 2021-09-26 NOTE — ED Notes (Signed)
Unable to get blood drawn for hepatic function panel. Lab will come draw.

## 2021-09-26 NOTE — ED Triage Notes (Addendum)
Pt brought in by [facility] from home with c/o a fall. EMS reports that she was called out by family because pt slipped out of her WC and sat on the ground for a bout three hours. They report that family does not want her back and that pt is Alert and Oriented times 4. They state that she was saturated in urine and was just discharged from a facility for rehab. [facility] reports that they are going to APS on the pts condition when they got there as she was sitting in two saturated briefs.
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
## 2021-09-27 LAB — URINALYSIS, COMPLETE (UACMP) WITH MICROSCOPIC
Bilirubin Urine: NEGATIVE
Glucose, UA: NEGATIVE mg/dL
Hgb urine dipstick: NEGATIVE
Ketones, ur: NEGATIVE mg/dL
Nitrite: POSITIVE — AB
Protein, ur: 100 mg/dL — AB
Specific Gravity, Urine: 1.011 (ref 1.005–1.030)
WBC, UA: >50 WBC/hpf — ABNORMAL HIGH (ref 0–5)
pH: 9 — ABNORMAL HIGH (ref 5.0–8.0)

## 2021-09-27 MED ORDER — CEPHALEXIN 250 MG PO CAPS
250.0000 mg | ORAL_CAPSULE | Freq: Three times a day (TID) | ORAL | Status: DC
  Administered 2021-09-27 – 2021-10-01 (×13): 250 mg via ORAL
  Filled 2021-09-27 (×12): qty 1

## 2021-09-27 NOTE — ED Notes (Signed)
RN to bedside to introduce self to pt. Pt sleeping.  

## 2021-09-27 NOTE — ED Notes (Signed)
Pt assisted w/ dinner tray.

## 2021-09-27 NOTE — ED Notes (Signed)
PT with pt at this time.

## 2021-09-27 NOTE — ED Notes (Signed)
RN to bedside to slide pt up in bed. Pt knocked over all over her drinks and food into the floor. EVS called to clean room.

## 2021-09-27 NOTE — ED Notes (Signed)
Pt repositioned in bed.
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
## 2021-09-27 NOTE — ED Notes (Signed)
Pt repositioned in bed. Will request hospital bed

## 2021-09-27 NOTE — ED Notes (Addendum)
New chux, brief, and purewick applied. Legs hanging out of bed- pt boosted up and repositioned with assistance. Perineal/sacral skin is warm, moist, appears intact.

## 2021-09-27 NOTE — ED Notes (Signed)
Pt encourage to cough, mouth suctioned. Pt boosted in bed and turned to left side. TV programmed for pt.

## 2021-09-27 NOTE — ED Notes (Signed)
Brief changed, pt repositioned in bed, lights dimmed for comfort

## 2021-09-27 NOTE — ED Notes (Signed)
Pt placed in hospital bed for comfort and safety. New linens and chux applied.

## 2021-09-27 NOTE — ED Notes (Signed)
RN to bedside to give meds. Pt wet. Linens changed. Clean brief placed.

## 2021-09-27 NOTE — Progress Notes (Signed)
Inpatient Rehab Admissions Coordinator :  Per therapy recommendations patient was screened for CIR candidacy by Danne Baxter RN MSN. Patient does not appear to demonstrate the medical neccesity for a [place] /CIR admit. I will not place a Rehab Consult. Recommend other Rehab Venues to be pursued.  Noted recent [facility] SNF discharge and [facility] pursuing SNF. Please contact me with any questions.  Danne Baxter RN MSN Admissions Coordinator [phone]

## 2021-09-27 NOTE — ED Provider Notes (Signed)
-----------------------------------------   5:15 AM on 09/27/2021 -----------------------------------------   Blood pressure (!) 147/88, pulse 85, temperature 97.7 F (36.5 C), temperature source Oral, resp. rate 20, height 5\' 5"  (1.651 m), weight 89.9 kg, SpO2 100 %.  The patient is calm and cooperative at this time.  There have been no acute events since the last update.  UA demonstrates leukocyte and positive UTI; will start Keflex.  Awaiting disposition plan from clinical social work.   Paulette Blanch, MD 09/27/21 [phone]

## 2021-09-27 NOTE — TOC Progression Note (Signed)
Transition of Care [facility]) - Progression Note    Patient Details  Name: [name] [name] MRN: [id number] Date of Birth: 1930/04/08  Transition of Care [facility]) CM/SW [place], [place] Phone Number: 09/27/2021, 5:10 PM  Clinical Narrative:     PT/OT recommend SNF placement.  SNF search started.     Barriers to Discharge: SNF Pending bed offer  Expected Discharge Plan and Services                                                 Social Determinants of Health (SDOH) Interventions    Readmission Risk Interventions Readmission Risk Prevention Plan 09/12/2021  Transportation Screening Complete  Medication Review Press photographer) Complete  SW Recovery Care/Counseling Consult Complete  Palliative Care Screening Complete  Skilled Nursing Facility Complete  Some recent data might be hidden

## 2021-09-27 NOTE — ED Notes (Signed)
Pt appears to have swallow issues with taking her PO meds. Rolls her tongue. Concern for aspiration risk. Will put in OT/speech consult.

## 2021-09-27 NOTE — Progress Notes (Signed)
SLP Cancellation Note  Patient Details Name: Marian Meneely MRN: [id number] DOB: Jun 05, 1930   Cancelled treatment:       Reason Eval/Treat Not Completed: SLP screened, no needs identified, will sign off   Pt recently discharged on dysphagia 1 diet with thin liquids, medicine crushed in puree. Will place this order. Spoke with pt's nurse and provided information.   Kendre Sires B. Rutherford Nail M.S., CCC-SLP, [place] Office [phone]   Stormy Fabian 09/27/2021, 1:47 PM

## 2021-09-27 NOTE — TOC Initial Note (Signed)
Transition of Care [facility]) - Progression Note    Patient Details  Name: [name] [name] MRN: [id number] Date of Birth: 10/27/1930  Transition of Care [facility]) CM/SW [place], [place] Phone Number: [phone] 09/27/2021, 1:58 PM  Clinical Narrative:     Patient presents to [facility] from home due to fall.  Patient was discharged recently to [facility] and returned home.  Patient's main contacts are Bud Face (Granddaughter) [phone] and Jolayne Haines [phone].  CSW left voicemail for both Ms. Bet[place] and [place].  Discharge plan is for SNF placement, pending PT/OT evaluation.      Barriers to Discharge: SNF Pending bed offer  Expected Discharge Plan and Services                                                 Social Determinants of Health (SDOH) Interventions    Readmission Risk Interventions Readmission Risk Prevention Plan 09/12/2021  Transportation Screening Complete  Medication Review Press photographer) Complete  SW Recovery Care/Counseling Consult Complete  Palliative Care Screening Complete  Skilled Nursing Facility Complete  Some recent data might be hidden

## 2021-09-27 NOTE — Evaluation (Addendum)
Physical Therapy Evaluation Patient Details Name: [name] [name] MRN: [id number] DOB: [birth year] Today's Date: 09/27/2021  History of Present Illness  87 y.o. female  with medical history significant for dementia, DM with complications of stage IV chronic kidney disease, HTN and HDL as well as recent hospitalization 11/1-11/11 for sepsis secondary to UTI and COVID. Pt had just d/c from SNF (per pt request stating she wanted to go home with granddaughter) the day prior to ED arrival. Pt arrives to ED s/p fall where she found sitting on the floor for 3 hours.  Clinical Impression  Pt received supine in bed, eyes closed however easily aroused. Pt agreeable to therapy. Pt attempted to participate with supine>sit, PT assisted with lifting trunk and managing BLE off EOB. Pt remained sitting EOB for >5 minutes as PT attempted to encourage STS. Pt counted down the stand with PT and verbalized understanding of task however was unable to participate in stand as she forcibly leaned back rather than forward to stand. Multimodal cueing attempted. 3 attempts at STS. During sitting time, pt ranged from CGA-MOD A with decreased righting responses. Pt then returned to bed with total assist as she made no initiative to return to bed (although reporting she would like to lay down). PT rec pt d/c to SNF and family begin looking into long-term care plan including memory care if no longer able to care for pt at home. Would benefit from skilled PT to address above deficits and promote optimal return to PLOF.      Recommendations for follow up therapy are one component of a multi-disciplinary discharge planning process, led by the attending physician.  Recommendations may be updated based on patient status, additional functional criteria and insurance authorization.  Follow Up Recommendations Skilled nursing-short term rehab (<3 hours/day)    Assistance Recommended at Discharge Frequent or constant Supervision/Assistance   Functional Status Assessment Patient has had a recent decline in their functional status and demonstrates the ability to make significant improvements in function in a reasonable and predictable amount of time.  Equipment Recommendations  None recommended by PT    Recommendations for Other Services       Precautions / Restrictions Precautions Precautions: Fall Restrictions Weight Bearing Restrictions: No      Mobility  Bed Mobility Overal bed mobility: Needs Assistance Bed Mobility: Supine to Sit;Sit to Supine     Supine to sit: Max assist;HOB elevated Sit to supine: Total assist   General bed mobility comments: Pt participated with supine>sit; limited participation throughout remaining session.    Transfers                   General transfer comment: Attempted - pt unable to follow commands to stand. Pt pushed backwards as PT attempted to provide tactile cueing to lean forward and stand.    Ambulation/Gait               General Gait Details: deferred - unable to stand  Stairs            Wheelchair Mobility    Modified Rankin (Stroke Patients Only)       Balance Overall balance assessment: Needs assistance Sitting-balance support: Bilateral upper extremity supported;Feet supported Sitting balance-Leahy Scale: Poor Sitting balance - Comments: occasional bouts of LOB while sitting EOB with large trunk sway. Ranged from CGA-MOD A with VC to correct as pt had limited righting response.  Pertinent Vitals/Pain Pain Assessment: No/denies pain    Home Living Family/patient expects to be discharged to:: Private residence Living Arrangements: Other relatives (granddaughter) Available Help at Discharge: Family;Available PRN/intermittently           Home Layout: One level   Additional Comments: Home set-up/PLOF information obtained from chart review and previous recent admission as pt was  unable to provide information in session. No family present.    Prior Function Prior Level of Function : Needs assist       Physical Assist : Mobility (physical);ADLs (physical)     Mobility Comments: Pt unable to provide info. D/c from SNF 1 day prior to arrival at ED. ADLs Comments: Pt unable to provide info. D/c from SNF 1 day prior to arrival at ED.     Hand Dominance        Extremity/Trunk Assessment   Upper Extremity Assessment Upper Extremity Assessment: Difficult to assess due to impaired cognition;Generalized weakness    Lower Extremity Assessment Lower Extremity Assessment: Difficult to assess due to impaired cognition;Generalized weakness    Cervical / Trunk Assessment Cervical / Trunk Assessment: Kyphotic  Communication   Communication: No difficulties  Cognition Arousal/Alertness: Lethargic Behavior During Therapy: WFL for tasks assessed/performed Overall Cognitive Status: History of cognitive impairments - at baseline                                 General Comments: Pt oriented to self, month, "hospital" and situation. Unable to provide specific information such as date/day of the week, specific hospital. Pt had difficulty following 1 step commands in today's evaluation limiting participation.        General Comments      Exercises     Assessment/Plan    PT Assessment Patient needs continued PT services  PT Problem List Decreased strength;Decreased mobility;Decreased safety awareness;Decreased range of motion;Decreased activity tolerance;Decreased cognition;Decreased balance       PT Treatment Interventions DME instruction;Therapeutic exercise;Gait training;Balance training;Neuromuscular re-education;Functional mobility training;Therapeutic activities;Patient/family education    PT Goals (Current goals can be found in the Care Plan section)  Acute Rehab PT Goals PT Goal Formulation: Patient unable to participate in goal setting     Frequency Min 2X/week   Barriers to discharge Decreased caregiver support granddaughter unable to provide level of care    Co-evaluation               AM-PAC PT "6 Clicks" Mobility  Outcome Measure Help needed turning from your back to your side while in a flat bed without using bedrails?: A Lot Help needed moving from lying on your back to sitting on the side of a flat bed without using bedrails?: A Lot Help needed moving to and from a bed to a chair (including a wheelchair)?: A Lot Help needed standing up from a chair using your arms (e.g., wheelchair or bedside chair)?: Total Help needed to walk in hospital room?: Total Help needed climbing 3-5 steps with a railing? : Total 6 Click Score: 9    End of Session Equipment Utilized During Treatment: Gait belt Activity Tolerance: Other (comment) (limited by cognition) Patient left: in bed;with call bell/phone within reach;with bed alarm set Nurse Communication: Mobility status PT Visit Diagnosis: Muscle weakness (generalized) (M62.81);History of falling (Z91.81)    Time: 1478-2956 PT Time Calculation (min) (ACUTE ONLY): 23 min   Charges:   PT Evaluation $PT Eval Moderate Complexity: 1 Mod PT Treatments $Therapeutic  Activity: 8-22 mins        Patrina Levering PT, DPT 09/27/21 4:10 PM [phone]

## 2021-09-28 NOTE — ED Notes (Signed)
Pt will not keep purewick, gown, or blankets on top of her. Pt still completely naked in the bed. Explained that there were people walking around the department, pt states "I want to go home" Will continue to encourage pt to keep covered.

## 2021-09-28 NOTE — Evaluation (Signed)
Occupational Therapy Evaluation Patient Details Name: [name] [name] MRN: [id number] DOB: Sep 04, 1930 Today's Date: 09/28/2021   History of Present Illness 87 y.o. female  with medical history significant for dementia, DM with complications of stage IV chronic kidney disease, HTN and HDL as well as recent hospitalization 11/1-11/11 for sepsis secondary to UTI and COVID. Pt had just d/c from SNF (per pt request stating she wanted to go home with granddaughter) the day prior to ED arrival. Pt arrives to ED s/p fall where she found sitting on the floor for 3 hours.   Clinical Impression   Chart reviewed, RN cleared pt for participation in OT evaluation. At start of evaluation, pt is noted to have no clothing, stating "Jesus get me out of here". Pt re-oriented to place and situation, is agreeable to all tasks with increased time. Throughout evaluation, pt is oriented to self, place and generally situation. Pt presents presents with generalized weakness throughout affecting ADL completion in addition to cognitive impairments. Of note, pt appeared with R hand tremor- RN notified. Pt would benefit from discharge to SNF to address functional deficits. Pt is left as received, NAD, all needs met. RN aware of pt status. OT will continue to follow.      Recommendations for follow up therapy are one component of a multi-disciplinary discharge planning process, led by the attending physician.  Recommendations may be updated based on patient status, additional functional criteria and insurance authorization.   Follow Up Recommendations  Skilled nursing-short term rehab (<3 hours/day)    Assistance Recommended at Discharge Frequent or constant Supervision/Assistance  Functional Status Assessment  Patient has had a recent decline in their functional status and/or demonstrates limited ability to make significant improvements in function in a reasonable and predictable amount of time  Equipment Recommendations  Other  (comment) (per next venue of care)    Recommendations for Other Services       Precautions / Restrictions Precautions Precautions: Fall Restrictions Weight Bearing Restrictions: No      Mobility Bed Mobility Overal bed mobility: Needs Assistance Bed Mobility: Supine to Sit;Sit to Supine     Supine to sit: HOB elevated;Max assist Sit to supine: Max assist        Transfers                          Balance                                           ADL either performed or assessed with clinical judgement   ADL Overall ADL's : Needs assistance/impaired Eating/Feeding: Minimal assistance   Grooming: Wash/dry face;Wash/dry hands;Minimal assistance;Bed level           Upper Body Dressing : Maximal assistance;Bed level   Lower Body Dressing: Maximal assistance       Toileting- Clothing Manipulation and Hygiene: Maximal assistance;Bed level         General ADL Comments: STS not attempted on this date as pt is not safe to attempt with +1- swinging legs over bed, saying "I am going to walk out of here"     Vision   Additional Comments: unable to appropriately assess due to cogntive impairments; will continue to assess     Perception     Praxis      Pertinent Vitals/Pain Pain Assessment: Faces Faces Pain Scale: Hurts  a little bit Pain Intervention(s): Limited activity within patient's tolerance;Monitored during session;Repositioned     Hand Dominance     Extremity/Trunk Assessment Upper Extremity Assessment Upper Extremity Assessment: Generalized weakness;LUE deficits/detail;Difficult to assess due to impaired cognition LUE Deficits / Details: difficult to assess due to cognitive impairments, however pt appears with LUE generalized weakness   Lower Extremity Assessment Lower Extremity Assessment: Generalized weakness   Cervical / Trunk Assessment Cervical / Trunk Assessment: Kyphotic   Communication     Cognition  Arousal/Alertness: Lethargic Behavior During Therapy: Restless;Anxious Overall Cognitive Status: No family/caregiver present to determine baseline cognitive functioning Area of Impairment: Orientation;Attention;Memory;Following commands;Safety/judgement;Awareness;Problem solving                 Orientation Level: Disoriented to;Time Current Attention Level: Focused   Following Commands: Follows one step commands with increased time Safety/Judgement: Decreased awareness of safety;Decreased awareness of deficits Awareness: Intellectual Problem Solving: Slow processing;Difficulty sequencing;Requires verbal cues;Requires tactile cues;Decreased initiation General Comments: At start of session pt was noted to be in bed with no clothing, legs hanging over rails; Pt re-oriented with significant multi modal cueing, then able to engage in conversation and task; Pt oriented to self, place, generally to situation; Increased time required for all task completion;     General Comments       Exercises Other Exercises Other Exercises: significant reorientation back to tasks at hand required throughout evaluation   Shoulder Instructions      Home Living Family/patient expects to be discharged to:: Private residence Living Arrangements: Other relatives Available Help at Discharge: Family;Available PRN/intermittently                             Additional Comments: home set up/PLOF obtainted via chart review as pt unable to provide information, no family present      Prior Functioning/Environment Prior Level of Function : Needs assist             Mobility Comments: pt unable to provide, however dc'd from SNF1 day before arrival to ED ADLs Comments: pt unable to provide, however dc'd from SNF1 day before arrival to ED        OT Problem List: Decreased strength;Impaired balance (sitting and/or standing);Decreased cognition;Decreased safety awareness;Decreased activity  tolerance;Decreased coordination      OT Treatment/Interventions: Self-care/ADL training;Manual therapy;Therapeutic exercise;DME and/or AE instruction;Therapeutic activities;Patient/family education    OT Goals(Current goals can be found in the care plan section) Acute Rehab OT Goals Patient Stated Goal: to get out of here OT Goal Formulation: With patient Time For Goal Achievement: 10/12/21 Potential to Achieve Goals: Fair ADL Goals Pt Will Perform Grooming: with min assist;sitting Pt Will Perform Upper Body Dressing: with min assist;sitting Pt Will Perform Lower Body Dressing: with min assist;sitting/lateral leans Pt Will Perform Toileting - Clothing Manipulation and hygiene: with mod assist  OT Frequency: Min 1X/week   Barriers to D/C: Decreased caregiver support          Co-evaluation              AM-PAC OT "6 Clicks" Daily Activity     Outcome Measure Help from another person eating meals?: A Little Help from another person taking care of personal grooming?: A Lot Help from another person toileting, which includes using toliet, bedpan, or urinal?: A Lot Help from another person bathing (including washing, rinsing, drying)?: A Lot Help from another person to put on and taking off regular upper body clothing?: A  Lot Help from another person to put on and taking off regular lower body clothing?: A Lot 6 Click Score: 13   End of Session Nurse Communication: Mobility status  Activity Tolerance: Patient limited by lethargy Patient left: in bed;with call bell/phone within reach;with bed alarm set  OT Visit Diagnosis: Other symptoms and signs involving cognitive function;Muscle weakness (generalized) (M62.81)                Time: 8916-9450 OT Time Calculation (min): 28 min Charges:  OT General Charges $OT Visit: 1 Visit OT Evaluation $OT Eval Moderate Complexity: 1 Mod OT Treatments $Self Care/Home Management : 8-22 mins  Shanon Payor, OTD OTR/L  09/28/21, 5:39 PM

## 2021-09-28 NOTE — ED Provider Notes (Signed)
Today's Vitals   09/27/21 1612 09/27/21 1630 09/27/21 1700 09/28/21 0518  BP:   95/73 (!) 108/58  Pulse: 96   97  Resp: (!) 25 (!) 29 (!) 22 20  Temp:    98.3 F (36.8 C)  TempSrc:    Oral  SpO2:    97%  Weight:      Height:      PainSc:       Body mass index is 32.98 kg/m.   Patient resting comfortably with no acute complaints.  Awaiting social work disposition.   Pearlena Ow, Delice Bison, DO 09/28/21 [phone]

## 2021-09-28 NOTE — ED Notes (Addendum)
Pt completely naked on the bed. Pt is moaning and yelling out "Jesus Christ" This RN and Jacque, changed chucks pad, brief, and purewick at this time.

## 2021-09-28 NOTE — ED Notes (Addendum)
[place] Therapist, sports and NT at bedside. Pt legs were hanging off the bed. Pt had full linen changed and repositioned in the bed. Pt is alert but oriented to only self.

## 2021-09-28 NOTE — ED Notes (Signed)
Pt cleansed of urine and bed linens changed.

## 2021-09-28 NOTE — ED Notes (Addendum)
Pt found laying perpendicular to the bed. Pt's legs were hanging off the bed at this time. Pt tearful stating he wanted to go home. Explained pt had to stay here until she found her somewhere to stay. Pt upset with this explanation. Placed bed alarm on this pt.

## 2021-09-28 NOTE — ED Notes (Addendum)
Lunch meal tray given. Pt is currently sleeping at this time.

## 2021-09-28 NOTE — ED Notes (Signed)
Pt ate 100% of meal tray at this time. Pt unable to feed self. This RN had to feed pt. Pt also takes medication crushed in applesauce.

## 2021-09-29 LAB — URINE CULTURE

## 2021-09-29 NOTE — ED Notes (Signed)
Pt is resting comfortably in bed at this time. Equal rise and fall of the chest.

## 2021-09-29 NOTE — ED Notes (Signed)
Pt was trying get out of bed. This tech and Laura,RN repositioned pt back in bed. Peri/Inc care provided. Applied a new brief and purewick. . Bed alarm on.

## 2021-09-30 NOTE — ED Notes (Signed)
Patient situated and pulled up in bed. Assisting patient with food tray at this time

## 2021-09-30 NOTE — ED Notes (Signed)
Patient found laying sideways on bed. Patient cleaned and pulled up in bed by this RN, Faith tech, and [employer]

## 2021-09-30 NOTE — ED Notes (Signed)
Pt is awake and pleasant at this time.

## 2021-09-30 NOTE — TOC Progression Note (Addendum)
Transition of Care [facility]) - Progression Note    Patient Details  Name: [name] [name] MRN: [id number] Date of Birth: Jul 28, 1930  [facility]) CM/SW Contact  Kerin Salen, RN Phone Number: 09/30/2021, 11:01 AM  Clinical Narrative:  Attempted to call granddaughter to discuss [facility] SNF bedoffer no answer LVM to return call. Called alternate number and was told that granddaughter was sleep and will call back when she wake up.  11:35am Granddaughter returned call, discussed bed offer and she accepts [place]. Lorenzo spoke with Shirlee Limerick, admissions coordinator who will check for SNF days and call me back with decision to proceed with authorization.     Barriers to Discharge: SNF Pending bed offer  Expected Discharge Plan and Services                                                 Social Determinants of Health (SDOH) Interventions    Readmission Risk Interventions Readmission Risk Prevention Plan 09/12/2021  Transportation Screening Complete  Medication Review Press photographer) Complete  SW Recovery Care/Counseling Consult Complete  Palliative Care Screening Complete  Skilled Nursing Facility Complete  Some recent data might be hidden

## 2021-09-30 NOTE — Progress Notes (Signed)
PT Cancellation Note  Patient Details Name: [name] [name] MRN: [id number] DOB: [birth year]   Cancelled Treatment:    Reason Eval/Treat Not Completed: Other (comment). Nursing staff in room performing care/hygiene with pt. RN states pt is hungry, breakfast tray is in room. PT will re-attempt treatment at later time/date.    Patrina Levering PT, DPT 09/30/21 12:44 PM [phone]

## 2021-09-30 NOTE — ED Notes (Signed)
Patient pulled up in bed. Assisting patient with lunch tray

## 2021-10-01 ENCOUNTER — Observation Stay: Payer: Medicare Other

## 2021-10-01 ENCOUNTER — Encounter: Payer: Self-pay | Admitting: Internal Medicine

## 2021-10-01 DIAGNOSIS — G9341 Metabolic encephalopathy: Secondary | ICD-10-CM | POA: Diagnosis not present

## 2021-10-01 DIAGNOSIS — R531 Weakness: Secondary | ICD-10-CM

## 2021-10-01 LAB — CBC WITH DIFFERENTIAL/PLATELET
Abs Immature Granulocytes: 0.05 10*3/uL (ref 0.00–0.07)
Basophils Absolute: 0 10*3/uL (ref 0.0–0.1)
Basophils Relative: 0 %
Eosinophils Absolute: 0.2 10*3/uL (ref 0.0–0.5)
Eosinophils Relative: 2 %
HCT: 31 % — ABNORMAL LOW (ref 36.0–46.0)
Hemoglobin: 9.9 g/dL — ABNORMAL LOW (ref 12.0–15.0)
Immature Granulocytes: 1 %
Lymphocytes Relative: 29 %
Lymphs Abs: 2.5 10*3/uL (ref 0.7–4.0)
MCH: 31.2 pg (ref 26.0–34.0)
MCHC: 31.9 g/dL (ref 30.0–36.0)
MCV: 97.8 fL (ref 80.0–100.0)
Monocytes Absolute: 0.9 10*3/uL (ref 0.1–1.0)
Monocytes Relative: 10 %
Neutro Abs: 5 10*3/uL (ref 1.7–7.7)
Neutrophils Relative %: 58 %
Platelets: 275 10*3/uL (ref 150–400)
RBC: 3.17 MIL/uL — ABNORMAL LOW (ref 3.87–5.11)
RDW: 14.9 % (ref 11.5–15.5)
WBC: 8.7 10*3/uL (ref 4.0–10.5)
nRBC: 0 % (ref 0.0–0.2)

## 2021-10-01 LAB — PHOSPHORUS: Phosphorus: 3.5 mg/dL (ref 2.5–4.6)

## 2021-10-01 LAB — BASIC METABOLIC PANEL
Anion gap: 6 (ref 5–15)
BUN: 23 mg/dL (ref 8–23)
CO2: 21 mmol/L — ABNORMAL LOW (ref 22–32)
Calcium: 9.1 mg/dL (ref 8.9–10.3)
Chloride: 111 mmol/L (ref 98–111)
Creatinine, Ser: 1.84 mg/dL — ABNORMAL HIGH (ref 0.44–1.00)
GFR, Estimated: 26 mL/min — ABNORMAL LOW (ref >60)
Glucose, Bld: 89 mg/dL (ref 70–99)
Potassium: 5.5 mmol/L — ABNORMAL HIGH (ref 3.5–5.1)
Sodium: 138 mmol/L (ref 135–145)

## 2021-10-01 LAB — VITAMIN B12: Vitamin B-12: 247 pg/mL (ref 180–914)

## 2021-10-01 LAB — TSH: TSH: 1.971 u[IU]/mL (ref 0.350–4.500)

## 2021-10-01 LAB — AMMONIA: Ammonia: 16 umol/L (ref 9–35)

## 2021-10-01 LAB — MAGNESIUM: Magnesium: 1.7 mg/dL (ref 1.7–2.4)

## 2021-10-01 MED ORDER — ACETAMINOPHEN 650 MG RE SUPP: 650.0000 mg | Freq: Four times a day (QID) | RECTAL | Status: AC | PRN

## 2021-10-01 MED ORDER — FUROSEMIDE 10 MG/ML IJ SOLN
40.0000 mg | Freq: Once | INTRAMUSCULAR | Status: AC
  Administered 2021-10-01: 40 mg via INTRAVENOUS
  Filled 2021-10-01: qty 4

## 2021-10-01 MED ORDER — ONDANSETRON HCL 4 MG/2ML IJ SOLN: 4.0000 mg | Freq: Four times a day (QID) | INTRAMUSCULAR | Status: DC | PRN

## 2021-10-01 MED ORDER — ACETAMINOPHEN 325 MG PO TABS
650.0000 mg | ORAL_TABLET | Freq: Four times a day (QID) | ORAL | Status: AC | PRN
  Administered 2021-10-02: 650 mg via ORAL
  Filled 2021-10-01 (×2): qty 2

## 2021-10-01 MED ORDER — HEPARIN SODIUM (PORCINE) 5000 UNIT/ML IJ SOLN
5000.0000 [IU] | Freq: Three times a day (TID) | INTRAMUSCULAR | Status: DC
  Administered 2021-10-01 – 2021-10-05 (×12): 5000 [IU] via SUBCUTANEOUS
  Filled 2021-10-01 (×12): qty 1

## 2021-10-01 MED ORDER — SODIUM CHLORIDE 0.9 % IV SOLN
2.0000 g | INTRAVENOUS | Status: DC
  Administered 2021-10-01 – 2021-10-04 (×4): 2 g via INTRAVENOUS
  Filled 2021-10-01 (×2): qty 20
  Filled 2021-10-01 (×3): qty 2

## 2021-10-01 MED ORDER — ONDANSETRON HCL 4 MG PO TABS: 4.0000 mg | ORAL_TABLET | Freq: Four times a day (QID) | ORAL | Status: DC | PRN

## 2021-10-01 MED ORDER — SODIUM ZIRCONIUM CYCLOSILICATE 10 G PO PACK
10.0000 g | PACK | Freq: Once | ORAL | Status: AC
  Administered 2021-10-01: 10 g via ORAL
  Filled 2021-10-01: qty 1

## 2021-10-01 MED ORDER — SODIUM CHLORIDE 0.9 % IV BOLUS
500.0000 mL | Freq: Once | INTRAVENOUS | Status: AC
  Administered 2021-10-01: 500 mL via INTRAVENOUS

## 2021-10-01 NOTE — ED Notes (Signed)
Informed RN bed assigned 

## 2021-10-01 NOTE — Progress Notes (Signed)
Occupational Therapy Treatment Patient Details Name: [name] [name] MRN: [id number] DOB: 06-20-1930 Today's Date: 10/01/2021   History of present illness 87 y.o. female  with medical history significant for dementia, DM with complications of stage IV chronic kidney disease, HTN and HDL as well as recent hospitalization 11/1-11/11 for sepsis secondary to UTI and COVID. Pt had just d/c from SNF (per pt request stating she wanted to go home with granddaughter) the day prior to ED arrival. Pt arrives to ED s/p fall where she found sitting on the floor for 3 hours.   OT comments  Pt seen for co-tx with PT to address ADL/mobility. Pt received in bed, eyes closed, and requiring cues to open eyes. She is able to respond to simple questions appropriately but requires intermittent cueing to maintain alertness initially. Pt noted to have saturated brief/linens. RN called to assist. MAX A x2 for rolling side to side + pericare/linens change. MAX A x2 for sup>sit EOB with significant stiffness noted in BLE. Pt required initial MOD A for sitting balance improving to CGA-MOD A and requiring repositioning of BLE to improve sitting balance and positioning in preparation for additional standing attempts. Pt stood total of 2 times from EOB, significant posterior lean requiring cues to attempt to correct,  ultimately requiring MAX A x2 to maintain standing. When asked, pt endorses feeling pleased with standing today. Pt continues to benefit from skilled OT services. Continue to recommend SNF at this time.    Recommendations for follow up therapy are one component of a multi-disciplinary discharge planning process, led by the attending physician.  Recommendations may be updated based on patient status, additional functional criteria and insurance authorization.    Follow Up Recommendations  Skilled nursing-short term rehab (<3 hours/day)    Assistance Recommended at Discharge Frequent or constant Supervision/Assistance   Equipment Recommendations  Other (comment) (defer to next venue)    Recommendations for Other Services      Precautions / Restrictions Precautions Precautions: Fall Restrictions Weight Bearing Restrictions: No       Mobility Bed Mobility Overal bed mobility: Needs Assistance Bed Mobility: Rolling;Supine to Sit;Sit to Supine Rolling: Max assist;+2 for physical assistance   Supine to sit: Max assist;+2 for physical assistance Sit to supine: Max assist;+2 for physical assistance   General bed mobility comments: cues for sequencing    Transfers Overall transfer level: Needs assistance Equipment used: 2 person hand held assist Transfers: Sit to/from Stand Sit to Stand: Max assist;+2 physical assistance           General transfer comment: unable to maintain standing balance requiring significant assist to maintain standing, significant posterior lean     Balance Overall balance assessment: Needs assistance Sitting-balance support: No upper extremity supported;Feet supported Sitting balance-Leahy Scale: Poor Sitting balance - Comments: requires CGA-MOD A for static sitting balance Postural control: Posterior lean   Standing balance-Leahy Scale: Zero Standing balance comment: requires MAX A +2 to maintain static standing                           ADL either performed or assessed with clinical judgement   ADL Overall ADL's : Needs assistance/impaired     Grooming: Sitting;Set up;Supervision/safety;Wash/dry hands;Wash/dry face;Minimal assistance;Cueing for sequencing Grooming Details (indicate cue type and reason): cues for sequencing as she tends to freeze periodically                     Toileting- Clothing Manipulation and  Hygiene: Maximal assistance;Bed level;Cueing for sequencing;+2 for physical assistance Toileting - Clothing Manipulation Details (indicate cue type and reason): MAX A +2 for pericare/hygiene after toileting with +2 for  rolling            Extremity/Trunk Assessment              Vision       Perception     Praxis      Cognition Arousal/Alertness: Lethargic Behavior During Therapy: WFL for tasks assessed/performed Overall Cognitive Status: No family/caregiver present to determine baseline cognitive functioning                                 General Comments: Pt initially a bit lethargic but did follow simple commands with cues + time, alertness improving with functional tasks          Exercises Other Exercises Other Exercises: bed level toileting/hygiene/linens change, ADL transfer training   Shoulder Instructions       General Comments      Pertinent Vitals/ Pain       Pain Assessment: No/denies pain  Home Living                                          Prior Functioning/Environment              Frequency  Min 2X/week        Progress Toward Goals  OT Goals(current goals can now be found in the care plan section)  Progress towards OT goals: Progressing toward goals  Acute Rehab OT Goals Patient Stated Goal: to get out of here OT Goal Formulation: With patient Time For Goal Achievement: 10/12/21 Potential to Achieve Goals: [place] Discharge plan remains appropriate;Frequency needs to be updated    Co-evaluation    PT/OT/SLP Co-Evaluation/Treatment: Yes Reason for Co-Treatment: Complexity of the patient's impairments (multi-system involvement);Necessary to address cognition/behavior during functional activity;For patient/therapist safety;To address functional/ADL transfers PT goals addressed during session: Mobility/safety with mobility;Balance OT goals addressed during session: ADL's and self-care      AM-PAC OT "6 Clicks" Daily Activity     Outcome Measure   Help from another person eating meals?: A Little Help from another person taking care of personal grooming?: A Little Help from another person toileting,  which includes using toliet, bedpan, or urinal?: Total Help from another person bathing (including washing, rinsing, drying)?: A Lot Help from another person to put on and taking off regular upper body clothing?: A Lot Help from another person to put on and taking off regular lower body clothing?: A Lot 6 Click Score: 13    End of Session Equipment Utilized During Treatment: Gait belt  OT Visit Diagnosis: Other symptoms and signs involving cognitive function;Muscle weakness (generalized) (M62.81)   Activity Tolerance Patient tolerated treatment well   Patient Left in bed;with call bell/phone within reach;with bed alarm set   Nurse Communication          Time: 1418-1500 OT Time Calculation (min): 42 min  Charges: OT General Charges $OT Visit: 1 Visit OT Treatments $Self Care/Home Management : 8-22 mins  Ardeth Perfect., MPH, MS, OTR/L ascom [phone] 10/01/21, 3:30 PM

## 2021-10-01 NOTE — ED Provider Notes (Addendum)
  Patient has been boarding for the past few days awaiting social placement.  Repeat labs performed this morning due to her mild hyperkalemia on presentation.  Repeats demonstrate worsening of this, potassium now 5.5.  Repeat EKG with increased amplitude of T waves.  We will initiate treatment for mild hyperkalemia. I discuss with hospitalist service who provides recommendations but declines admission. We will continue treatment in the ED and recheck another potassium this afternoon   Vladimir Crofts, MD 10/01/21 [place], [place], MD 10/01/21 [phone]

## 2021-10-01 NOTE — TOC Progression Note (Signed)
Transition of Care [facility]) - Progression Note    Patient Details  Name: [name] [name] MRN: [id number] Date of Birth: [birth year]  [facility] CM/SW Contact  Shelbie Hutching, RN Phone Number: 10/01/2021, 1:55 PM  Clinical Narrative:    RNCM spoke with [facility] via phone.  She reports that she was pressured by the patient and by her mother to take patient home after she was discharged to [facility] last admission.  Then when the patient got home with her patient didn't want to be there and she was having significantly hard time caring for her.  Iran Ouch says that she would like for the patient to go back to [facility] and she will sign any paperwork that needs to be signed. [facility] said they needed to talk to the business office before saying they could accept.  Message left with [facility] admissions for return call.       Barriers to Discharge: SNF Pending bed offer  Expected Discharge Plan and Services   In-house Referral: Clinical Social Work Discharge Planning Services: CM Consult Post Acute Care Choice: [name] Living arrangements for the past 2 months: [place], [place]                 DME Arranged: N/A DME Agency: NA       HH Arranged: NA           Social Determinants of Health (SDOH) Interventions    Readmission Risk Interventions Readmission Risk Prevention Plan 09/12/2021  Transportation Screening Complete  Medication Review Press photographer) Complete  SW Recovery Care/Counseling Consult Complete  Palliative Care Screening Complete  Skilled Nursing Facility Complete  Some recent data might be hidden

## 2021-10-01 NOTE — ED Notes (Signed)
Pt sleeping, resting comfortably in bed, NAD, chest rise & fall. No needs identified at this time. Bed low & locked. Call light & personal items within reach.

## 2021-10-01 NOTE — ED Notes (Signed)
Full linen change, pt cleaned up, new brief applied by this RN with the help of PT at bedside.
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
## 2021-10-01 NOTE — TOC Progression Note (Signed)
Transition of Care [facility]) - Progression Note    Patient Details  Name: [name] [name] MRN: [id number] Date of Birth: 04-Dec-1929  [facility] of [facility]) CM/SW Contact  Shelbie Hutching, RN Phone Number: 10/01/2021, 4:39 PM  Clinical Narrative:    [name], they would like to try for insurance authorization through [facility].  RNCM started insurance auth via [employer].  Granddaughter must go and sign admission paperwork before the patient can be admitted, she never went to sign admission paperwork last time.       Barriers to Discharge: SNF Pending bed offer  Expected Discharge Plan and Services   In-house Referral: Clinical Social Work Discharge Planning Services: CM Consult Post Acute Care Choice: [place] Living arrangements for the past 2 months: [place], [place]                 DME Arranged: N/A DME Agency: NA       HH Arranged: NA           Social Determinants of Health (SDOH) Interventions    Readmission Risk Interventions Readmission Risk Prevention Plan 09/12/2021  Transportation Screening Complete  Medication Review Press photographer) Complete  SW Recovery Care/Counseling Consult Complete  Palliative Care Screening Complete  Skilled Nursing Facility Complete  Some recent data might be hidden

## 2021-10-01 NOTE — Progress Notes (Signed)
Physical Therapy Treatment Patient Details Name: [name] [name] MRN: [id number] DOB: 03-May-1930 Today's Date: 10/01/2021   History of Present Illness 87 y.o. female  with medical history significant for dementia, DM with complications of stage IV chronic kidney disease, HTN and HDL as well as recent hospitalization 11/1-11/11 for sepsis secondary to UTI and COVID. Pt had just d/c from SNF (per pt request stating she wanted to go home with granddaughter) the day prior to ED arrival. Pt arrives to ED s/p fall where she found sitting on the floor for 3 hours.    PT Comments    Pt received supine in bed, asleep however easily aroused. Pt stated she would like to get out of bed. Bed level toileting/hygiene was performed at beginning of session. Limited participation with rolling and limited verbalized communication while in bed. Participation improved when pt was asked to perform functional tasks such as "sit on the edge of the bed" and "stand up". Pt progressed mobility by performing 2 stands today however standing balance and functional endurance remain significantly impaired. Would benefit from skilled PT to address above deficits and promote optimal return to PLOF.    Recommendations for follow up therapy are one component of a multi-disciplinary discharge planning process, led by the attending physician.  Recommendations may be updated based on patient status, additional functional criteria and insurance authorization.  Follow Up Recommendations  Skilled nursing-short term rehab (<3 hours/day)     Assistance Recommended at Discharge Frequent or constant Supervision/Assistance  Equipment Recommendations  None recommended by PT    Recommendations for Other Services       Precautions / Restrictions Precautions Precautions: Fall Restrictions Weight Bearing Restrictions: No     Mobility  Bed Mobility Overal bed mobility: Needs Assistance Bed Mobility: Rolling;Supine to Sit;Sit to  Supine Rolling: Max assist;+2 for physical assistance   Supine to sit: Max assist;+2 for physical assistance Sit to supine: Max assist;+2 for physical assistance   General bed mobility comments: cues for sequencing and participation. Little participation with rolling however pt did attempt to sit up to EOB.    Transfers Overall transfer level: Needs assistance Equipment used: 2 person hand held assist Transfers: Sit to/from Stand Sit to Stand: Max assist;+2 physical assistance           General transfer comment: MAX Ax2 to initiate, lift and steady. Knee block provided. Poor standing balance requiring significant assist to maintain due to posterior and leftward lean.    Ambulation/Gait               General Gait Details: attempted side steps - pt unable follow commands   Stairs             Wheelchair Mobility    Modified Rankin (Stroke Patients Only)       Balance Overall balance assessment: Needs assistance Sitting-balance support: No upper extremity supported;Feet supported Sitting balance-Leahy Scale: Poor Sitting balance - Comments: requires CGA-MOD A for static sitting balance EOB Postural control: Posterior lean   Standing balance-Leahy Scale: Zero Standing balance comment: requires MAX A +2 to maintain static standing due to posterior lean and leftward weight shift                            Cognition Arousal/Alertness: Lethargic Behavior During Therapy: WFL for tasks assessed/performed Overall Cognitive Status: No family/caregiver present to determine baseline cognitive functioning  General Comments: Pt initially a bit lethargic however will open eyes on command; alertness improved with functional tasks        Exercises Other Exercises Other Exercises: bed level toileting/hygiene/linens change, ADL transfer training    General Comments        Pertinent Vitals/Pain Pain  Assessment: No/denies pain    Home Living                          Prior Function            PT Goals (current goals can now be found in the care plan section) Acute Rehab PT Goals PT Goal Formulation: Patient unable to participate in goal setting    Frequency    Min 2X/week      PT Plan      Co-evaluation PT/OT/SLP Co-Evaluation/Treatment: Yes Reason for Co-Treatment: Complexity of the patient's impairments (multi-system involvement);Necessary to address cognition/behavior during functional activity;For patient/therapist safety;To address functional/ADL transfers PT goals addressed during session: Mobility/safety with mobility;Balance OT goals addressed during session: ADL's and self-care      AM-PAC PT "6 Clicks" Mobility   Outcome Measure  Help needed turning from your back to your side while in a flat bed without using bedrails?: A Lot Help needed moving from lying on your back to sitting on the side of a flat bed without using bedrails?: A Lot Help needed moving to and from a bed to a chair (including a wheelchair)?: A Lot Help needed standing up from a chair using your arms (e.g., wheelchair or bedside chair)?: A Lot Help needed to walk in hospital room?: Total Help needed climbing 3-5 steps with a railing? : Total 6 Click Score: 10    End of Session Equipment Utilized During Treatment: Gait belt Activity Tolerance: Patient tolerated treatment well Patient left: in bed;with call bell/phone within reach;with bed alarm set Nurse Communication: Mobility status PT Visit Diagnosis: Muscle weakness (generalized) (M62.81);History of falling (Z91.81);Unsteadiness on feet (R26.81);Other abnormalities of gait and mobility (R26.89);Difficulty in walking, not elsewhere classified (R26.2)     Time: 1418-1500 PT Time Calculation (min) (ACUTE ONLY): 42 min  Charges:  $Therapeutic Activity: 23-37 mins                     Patrina Levering PT, DPT 10/01/21 4:10  PM [phone]

## 2021-10-02 DIAGNOSIS — G9341 Metabolic encephalopathy: Secondary | ICD-10-CM | POA: Diagnosis not present

## 2021-10-02 DIAGNOSIS — I129 Hypertensive chronic kidney disease with stage 1 through stage 4 chronic kidney disease, or unspecified chronic kidney disease: Secondary | ICD-10-CM | POA: Diagnosis present

## 2021-10-02 DIAGNOSIS — K219 Gastro-esophageal reflux disease without esophagitis: Secondary | ICD-10-CM | POA: Diagnosis present

## 2021-10-02 DIAGNOSIS — J111 Influenza due to unidentified influenza virus with other respiratory manifestations: Secondary | ICD-10-CM | POA: Diagnosis not present

## 2021-10-02 DIAGNOSIS — Z9049 Acquired absence of other specified parts of digestive tract: Secondary | ICD-10-CM | POA: Diagnosis not present

## 2021-10-02 DIAGNOSIS — T7601XA Adult neglect or abandonment, suspected, initial encounter: Secondary | ICD-10-CM | POA: Diagnosis present

## 2021-10-02 DIAGNOSIS — E875 Hyperkalemia: Secondary | ICD-10-CM | POA: Diagnosis present

## 2021-10-02 DIAGNOSIS — E785 Hyperlipidemia, unspecified: Secondary | ICD-10-CM | POA: Diagnosis present

## 2021-10-02 DIAGNOSIS — Z79899 Other long term (current) drug therapy: Secondary | ICD-10-CM | POA: Diagnosis not present

## 2021-10-02 DIAGNOSIS — R296 Repeated falls: Secondary | ICD-10-CM | POA: Diagnosis present

## 2021-10-02 DIAGNOSIS — R54 Age-related physical debility: Secondary | ICD-10-CM | POA: Diagnosis present

## 2021-10-02 DIAGNOSIS — Z6832 Body mass index (BMI) 32.0-32.9, adult: Secondary | ICD-10-CM | POA: Diagnosis not present

## 2021-10-02 DIAGNOSIS — N184 Chronic kidney disease, stage 4 (severe): Secondary | ICD-10-CM | POA: Diagnosis present

## 2021-10-02 DIAGNOSIS — E1122 Type 2 diabetes mellitus with diabetic chronic kidney disease: Secondary | ICD-10-CM | POA: Diagnosis present

## 2021-10-02 DIAGNOSIS — N179 Acute kidney failure, unspecified: Secondary | ICD-10-CM | POA: Diagnosis present

## 2021-10-02 DIAGNOSIS — Z8616 Personal history of COVID-19: Secondary | ICD-10-CM | POA: Diagnosis not present

## 2021-10-02 DIAGNOSIS — R5381 Other malaise: Secondary | ICD-10-CM | POA: Diagnosis present

## 2021-10-02 DIAGNOSIS — F322 Major depressive disorder, single episode, severe without psychotic features: Secondary | ICD-10-CM | POA: Diagnosis present

## 2021-10-02 DIAGNOSIS — I440 Atrioventricular block, first degree: Secondary | ICD-10-CM | POA: Diagnosis present

## 2021-10-02 DIAGNOSIS — R0602 Shortness of breath: Secondary | ICD-10-CM | POA: Diagnosis present

## 2021-10-02 DIAGNOSIS — F03C3 Unspecified dementia, severe, with mood disturbance: Secondary | ICD-10-CM | POA: Diagnosis present

## 2021-10-02 DIAGNOSIS — N390 Urinary tract infection, site not specified: Secondary | ICD-10-CM | POA: Diagnosis present

## 2021-10-02 DIAGNOSIS — T502X5A Adverse effect of carbonic-anhydrase inhibitors, benzothiadiazides and other diuretics, initial encounter: Secondary | ICD-10-CM | POA: Diagnosis present

## 2021-10-02 LAB — CBC
HCT: 30.8 % — ABNORMAL LOW (ref 36.0–46.0)
Hemoglobin: 9.9 g/dL — ABNORMAL LOW (ref 12.0–15.0)
MCH: 30.8 pg (ref 26.0–34.0)
MCHC: 32.1 g/dL (ref 30.0–36.0)
MCV: 96 fL (ref 80.0–100.0)
Platelets: 275 10*3/uL (ref 150–400)
RBC: 3.21 MIL/uL — ABNORMAL LOW (ref 3.87–5.11)
RDW: 14.9 % (ref 11.5–15.5)
WBC: 6.4 10*3/uL (ref 4.0–10.5)
nRBC: 0 % (ref 0.0–0.2)

## 2021-10-02 LAB — BASIC METABOLIC PANEL
Anion gap: 7 (ref 5–15)
BUN: 30 mg/dL — ABNORMAL HIGH (ref 8–23)
CO2: 24 mmol/L (ref 22–32)
Calcium: 9.3 mg/dL (ref 8.9–10.3)
Chloride: 108 mmol/L (ref 98–111)
Creatinine, Ser: 2.42 mg/dL — ABNORMAL HIGH (ref 0.44–1.00)
GFR, Estimated: 19 mL/min — ABNORMAL LOW (ref >60)
Glucose, Bld: 116 mg/dL — ABNORMAL HIGH (ref 70–99)
Potassium: 5.2 mmol/L — ABNORMAL HIGH (ref 3.5–5.1)
Sodium: 139 mmol/L (ref 135–145)

## 2021-10-02 MED ORDER — GUAIFENESIN-DM 100-10 MG/5ML PO SYRP
5.0000 mL | ORAL_SOLUTION | ORAL | Status: DC | PRN
  Administered 2021-10-02 – 2021-10-06 (×4): 5 mL via ORAL
  Filled 2021-10-02 (×4): qty 5

## 2021-10-02 MED ORDER — SODIUM CHLORIDE 0.45 % IV SOLN: INTRAVENOUS | Status: DC

## 2021-10-02 NOTE — Progress Notes (Signed)
Nutrition Brief Note  Patient identified on the Malnutrition Screening Tool (MST) Report  Wt Readings from Last 15 Encounters:  10/01/21 89.9 kg  09/14/21 89.9 kg  08/30/21 85 kg  07/17/21 85 kg  07/10/21 84.7 kg  07/01/21 84.7 kg  02/04/21 57.6 kg  11/16/20 127 kg  11/06/20 114.4 kg  01/01/15 114.1 kg  12/01/12 115.7 kg  06/22/12 112.9 kg   [name] [name] is a 87 y.o. female with medical history significant for hypertension, morbid obesity, debility, hyperlipidemia, non-insulin-dependent diabetes mellitus,CKD stage IV, dementia, who presents emergency department for chief concerns of frequent falls.  Pt admitted with acute metabolic encephalopathy, AMS, and UTI.   Reviewed I/O's: -1 L x 24 hours and -2.2 L since admission  UOP: 1 L x 24 hours   Nutrition-Focused physical exam completed. Findings are no fat depletion, no muscle depletion, and no edema.    Reviewed wt hx; wt has been stable over the past 3 months.   Medications reviewed and include vitamin C, megace, vitamin D, and zinc sulfate.   Labs reviewed: K: 5.2.    Current diet order is dysphagia 1 diet with thin liquids, patient is consuming approximately 100% of meals at this time. Labs and medications reviewed.   No nutrition interventions warranted at this time. If nutrition issues arise, please consult RD.   Loistine Chance, RD, LDN, [place] Registered Dietitian II Certified Diabetes Care and Education Specialist Please refer to [facility] for RD and/or RD on-call/weekend/after hours pager

## 2021-10-02 NOTE — TOC Progression Note (Signed)
Transition of Care [facility]) - Progression Note    Patient Details  Name: Colleena Kurtenbach MRN: [id number] Date of Birth: 08-05-1930  [facility]) CM/SW Contact  Shelbie Hutching, RN Phone Number: 10/02/2021, 11:03 AM  Clinical Narrative:    [name] but they need to have [name] sign the admission paperwork first.  Crystal at [facility] has tried her twice today with no answer.  RNCM also called and left a message for [name] to return call.  Insurance Josem Kaufmann has been approved today through 11/28.       Barriers to Discharge: SNF Pending bed offer  Expected Discharge Plan and Services   In-house Referral: Clinical Social Work Discharge Planning Services: CM Consult Post Acute Care Choice: [place] Living arrangements for the past 2 months: [place], [place]                 DME Arranged: N/A DME Agency: NA       HH Arranged: NA           Social Determinants of Health (SDOH) Interventions    Readmission Risk Interventions Readmission Risk Prevention Plan 09/12/2021  Transportation Screening Complete  Medication Review Press photographer) Complete  SW Recovery Care/Counseling Consult Complete  Palliative Care Screening Complete  Skilled Nursing Facility Complete  Some recent data might be hidden

## 2021-10-02 NOTE — TOC Progression Note (Signed)
Transition of Care [facility]) - Progression Note    Patient Details  Name: Dyneshia Baccam MRN: [id number] Date of Birth: 03/23/1930  Transition of Care [facility]) CM/SW Contact  Shelbie Hutching, RN Phone Number: 10/02/2021, 2:51 PM  Clinical Narrative:    RNCM was able to speak with [facility] via phone.  She is out of town and not coming back until Monday.  Crystal at [facility] did say that she could email and do docu sign with the granddaughter.  Iran Ouch was going to see if she could get her mother to go and sign paper work.       Barriers to Discharge: SNF Pending bed offer  Expected Discharge Plan and Services   In-house Referral: Clinical Social Work Discharge Planning Services: CM Consult Post Acute Care Choice: [place] Living arrangements for the past 2 months: [place], [place]                 DME Arranged: N/A DME Agency: NA       HH Arranged: NA           Social Determinants of Health (SDOH) Interventions    Readmission Risk Interventions Readmission Risk Prevention Plan 09/12/2021  Transportation Screening Complete  Medication Review Press photographer) Complete  SW Recovery Care/Counseling Consult Complete  Palliative Care Screening Complete  Skilled Nursing Facility Complete  Some recent data might be hidden
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
## 2021-10-02 NOTE — Progress Notes (Signed)
Physical Therapy Treatment Patient Details Name: [name] [name] MRN: [id number] DOB: June 30, 1930 Today's Date: 10/02/2021   History of Present Illness 87 y.o. female  with medical history significant for dementia, DM with complications of stage IV chronic kidney disease, HTN and HDL as well as recent hospitalization 11/1-11/11 for sepsis secondary to UTI and COVID. Pt had just d/c from SNF (per pt request stating she wanted to go home with granddaughter) the day prior to ED arrival. Pt arrives to ED s/p fall where she found sitting on the floor for 3 hours.    PT Comments    Pt received up in chair upon arrival, much more alert and conversational than previous session. Pt completed B LE strengthening exercises in sitting prior to standing from chair to RW with Min/ModA. Initial posterior lean in standing, easily corrected with MinA "Push me forward".  Pt completed gait training with RW 3ft x 1, 60ft x 2 with CGA followed behind with chair for seated rest breaks.  Pt very motivated to work with therapy and anxious to return home.   Recommendations for follow up therapy are one component of a multi-disciplinary discharge planning process, led by the attending physician.  Recommendations may be updated based on patient status, additional functional criteria and insurance authorization.  Follow Up Recommendations  Skilled nursing-short term rehab (<3 hours/day)     Assistance Recommended at Discharge Frequent or constant Supervision/Assistance  Equipment Recommendations  None recommended by PT    Recommendations for Other Services       Precautions / Restrictions Precautions Precautions: Fall Restrictions Weight Bearing Restrictions: No     Mobility  Bed Mobility                    Transfers Overall transfer level: Needs assistance Equipment used: Rolling walker (2 wheels) Transfers: Sit to/from Stand Sit to Stand: Min assist;Mod assist           General transfer  comment:  (vc's for hand placement)    Ambulation/Gait Ambulation/Gait assistance: Min guard Gait Distance (Feet): 65 Feet Assistive device: Rolling walker (2 wheels) Gait Pattern/deviations: WFL(Within Functional Limits) Gait velocity: decreased     General Gait Details: 88ft x 1, 37ft x 2  with seated rest breaks   Stairs             Wheelchair Mobility    Modified Rankin (Stroke Patients Only)       Balance Overall balance assessment: Needs assistance Sitting-balance support: No upper extremity supported;Feet supported Sitting balance-Leahy Scale: Fair Sitting balance - Comments:  (leans to right)   Standing balance support: Bilateral upper extremity supported Standing balance-Leahy Scale: Fair Standing balance comment:  (initial posterior lean corrected with minA)                            Cognition Arousal/Alertness: Awake/alert Behavior During Therapy: WFL for tasks assessed/performed Overall Cognitive Status: No family/caregiver present to determine baseline cognitive functioning Area of Impairment: Orientation;Attention;Memory;Following commands;Safety/judgement;Awareness;Problem solving                 Orientation Level: Disoriented to;Time Current Attention Level: Focused   Following Commands: Follows one step commands consistently Safety/Judgement: Decreased awareness of safety;Decreased awareness of deficits Awareness: Intellectual Problem Solving: Requires verbal cues          Exercises General Exercises - Lower Extremity Long Arc Quad: AROM;Both;15 reps Hip Flexion/Marching: AROM;Both;10 reps Toe Raises: AROM;Both;15 reps Heel Raises: AROM;Both;15 reps  General Comments General comments (skin integrity, edema, etc.): vss, purewick intact      Pertinent Vitals/Pain Pain Assessment: No/denies pain    Home Living                          Prior Function            PT Goals (current goals can now be  found in the care plan section) Acute Rehab PT Goals Patient Stated Goal:  (go home) Progress towards PT goals: Progressing toward goals    Frequency    Min 2X/week      PT Plan Current plan remains appropriate    Co-evaluation              AM-PAC PT "6 Clicks" Mobility   Outcome Measure  Help needed turning from your back to your side while in a flat bed without using bedrails?: A Lot Help needed moving from lying on your back to sitting on the side of a flat bed without using bedrails?: A Lot Help needed moving to and from a bed to a chair (including a wheelchair)?: A Little Help needed standing up from a chair using your arms (e.g., wheelchair or bedside chair)?: A Little Help needed to walk in hospital room?: A Little Help needed climbing 3-5 steps with a railing? : Total 6 Click Score: 14    End of Session Equipment Utilized During Treatment: Gait belt Activity Tolerance: Patient tolerated treatment well Patient left: in chair;with call bell/phone within reach;with chair alarm set Nurse Communication: Mobility status PT Visit Diagnosis: Muscle weakness (generalized) (M62.81);History of falling (Z91.81);Unsteadiness on feet (R26.81);Other abnormalities of gait and mobility (R26.89);Difficulty in walking, not elsewhere classified (R26.2)     Time: 4270-6237 PT Time Calculation (min) (ACUTE ONLY): 45 min  Charges:  $Gait Training: 8-22 mins $Therapeutic Exercise: 8-22 mins $Therapeutic Activity: 8-22 mins                    Mikel Cella, PTA    Josie Dixon 10/02/2021, 1:32 PM

## 2021-10-03 DIAGNOSIS — G9341 Metabolic encephalopathy: Secondary | ICD-10-CM | POA: Diagnosis not present

## 2021-10-03 LAB — BASIC METABOLIC PANEL
Anion gap: 7 (ref 5–15)
BUN: 43 mg/dL — ABNORMAL HIGH (ref 8–23)
CO2: 23 mmol/L (ref 22–32)
Calcium: 8.6 mg/dL — ABNORMAL LOW (ref 8.9–10.3)
Chloride: 110 mmol/L (ref 98–111)
Creatinine, Ser: 2.71 mg/dL — ABNORMAL HIGH (ref 0.44–1.00)
GFR, Estimated: 16 mL/min — ABNORMAL LOW (ref >60)
Glucose, Bld: 95 mg/dL (ref 70–99)
Potassium: 5 mmol/L (ref 3.5–5.1)
Sodium: 140 mmol/L (ref 135–145)

## 2021-10-03 LAB — URINE CULTURE
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
## 2021-10-03 NOTE — Progress Notes (Signed)
Pt had a good night, slept, but woke up at this time irritable, angry and anxious, stating that it is thanksgiving and she wants to go home and that she is leaving today one way or another, reassured her that she could speak with doctor and case manager today when they round regarding discharge

## 2021-10-04 ENCOUNTER — Inpatient Hospital Stay: Payer: Medicare Other

## 2021-10-04 DIAGNOSIS — G9341 Metabolic encephalopathy: Secondary | ICD-10-CM | POA: Diagnosis not present

## 2021-10-04 LAB — CBC
HCT: 28 % — ABNORMAL LOW (ref 36.0–46.0)
Hemoglobin: 8.8 g/dL — ABNORMAL LOW (ref 12.0–15.0)
MCH: 30.4 pg (ref 26.0–34.0)
MCHC: 31.4 g/dL (ref 30.0–36.0)
MCV: 96.9 fL (ref 80.0–100.0)
Platelets: 210 10*3/uL (ref 150–400)
RBC: 2.89 MIL/uL — ABNORMAL LOW (ref 3.87–5.11)
RDW: 15.4 % (ref 11.5–15.5)
WBC: 6.5 10*3/uL (ref 4.0–10.5)
nRBC: 0 % (ref 0.0–0.2)

## 2021-10-04 LAB — RESPIRATORY PANEL BY PCR

## 2021-10-04 LAB — BASIC METABOLIC PANEL
Anion gap: 8 (ref 5–15)
BUN: 43 mg/dL — ABNORMAL HIGH (ref 8–23)
CO2: 21 mmol/L — ABNORMAL LOW (ref 22–32)
Calcium: 8.8 mg/dL — ABNORMAL LOW (ref 8.9–10.3)
Chloride: 111 mmol/L (ref 98–111)
Creatinine, Ser: 2.18 mg/dL — ABNORMAL HIGH (ref 0.44–1.00)
GFR, Estimated: 21 mL/min — ABNORMAL LOW (ref >60)
Glucose, Bld: 124 mg/dL — ABNORMAL HIGH (ref 70–99)
Potassium: 5.1 mmol/L (ref 3.5–5.1)
Sodium: 140 mmol/L (ref 135–145)

## 2021-10-04 LAB — LEGIONELLA PNEUMOPHILA SEROGP 1 UR AG: L. pneumophila Serogp 1 Ur Ag: NEGATIVE

## 2021-10-04 MED ORDER — MELATONIN 5 MG PO TABS
5.0000 mg | ORAL_TABLET | Freq: Every day | ORAL | Status: DC
  Administered 2021-10-04 – 2021-10-06 (×3): 5 mg via ORAL
  Filled 2021-10-04 (×5): qty 1

## 2021-10-04 NOTE — Progress Notes (Signed)
Physical Therapy Treatment Patient Details Name: [name] [name] MRN: [id number] DOB: 1930/11/09 Today's Date: 10/04/2021   History of Present Illness 87 y.o. female  with medical history significant for dementia, DM with complications of stage IV chronic kidney disease, HTN and HDL as well as recent hospitalization 11/1-11/11 for sepsis secondary to UTI and COVID. Pt had just d/c from SNF (per pt request stating she wanted to go home with granddaughter) the day prior to ED arrival. Pt arrives to ED s/p fall where she found sitting on the floor for 3 hours.    PT Comments    Patient globally confused, generally agitated during session attempt this PM. Believes therapist to be "starving her", "taking my disability" and "keeping me here to suffer".  Attempted reorientation and redirection multiple times with limited/no success.  Of note, nursing reports new meal tray has been ordered for patient; awaiting delivery. Requiring total assist +1-2 for bed mobility and care during session; generally resistive to all movement attempts/facilitation.  Unsafe for additional mobility or OOB attempts this date; will continue efforts next session.  May plan to trial AM session to see if participation improved during morning hours.    Recommendations for follow up therapy are one component of a multi-disciplinary discharge planning process, led by the attending physician.  Recommendations may be updated based on patient status, additional functional criteria and insurance authorization.  Follow Up Recommendations  Skilled nursing-short term rehab (<3 hours/day)     Assistance Recommended at Discharge Frequent or constant Supervision/Assistance  Equipment Recommendations       Recommendations for Other Services       Precautions / Restrictions Precautions Precautions: Fall Restrictions Weight Bearing Restrictions: No     Mobility  Bed Mobility Overal bed mobility: Needs Assistance Bed Mobility:  Rolling Rolling: Total assist;+2 for safety/equipment   Supine to sit: Min assist;HOB elevated Sit to supine: Max assist   General bed mobility comments: generally resistive to any/all movement attempts; total assist to initiate/complete bed  mobility    Transfers Overall transfer level: Needs assistance Equipment used: Rolling walker (2 wheels) Transfers: Sit to/from Stand Sit to Stand: Mod assist;+2 physical assistance;From elevated surface;Max assist           General transfer comment: unsafe/unable    Ambulation/Gait               General Gait Details: unsafe/unable   Stairs             Wheelchair Mobility    Modified Rankin (Stroke Patients Only)       Balance Overall balance assessment: Needs assistance Sitting-balance support: No upper extremity supported;Feet supported Sitting balance-Leahy Scale: Fair Sitting balance - Comments: initial posterior and   Standing balance support: Bilateral upper extremity supported Standing balance-Leahy Scale: Fair Standing balance comment: initial posterior lean requiring VC and CGA-Min A to correct                            Cognition Arousal/Alertness: Awake/alert Behavior During Therapy: Agitated Overall Cognitive Status: No family/caregiver present to determine baseline cognitive functioning                                 General Comments: generally agitated and angry; unable to redirect despite multiple attempts and strategies        Exercises Other Exercises Other Exercises: Rolling bilat, total assist +1, for peri-care  and linen change after incontinent bladder episode.  Dep of second person for hygiene.  Generally resistive to facilitation, limited/no active effort with task; did attempt to grab/swing at staff x1 during encounter. Other Exercises: STS ADL transfers EOB +RW during bed linens change by nurse tech, tolerated standing ~79min prior to need to sit again     General Comments        Pertinent Vitals/Pain Pain Assessment: Faces Faces Pain Scale: No hurt    Home Living                          Prior Function            PT Goals (current goals can now be found in the care plan section) Acute Rehab PT Goals PT Goal Formulation: Patient unable to participate in goal setting Progress towards PT goals: Not progressing toward goals - comment (participation limited due to agitation)    Frequency    Min 2X/week      PT Plan Current plan remains appropriate    Co-evaluation              AM-PAC PT "6 Clicks" Mobility   Outcome Measure  Help needed turning from your back to your side while in a flat bed without using bedrails?: Total Help needed moving from lying on your back to sitting on the side of a flat bed without using bedrails?: Total Help needed moving to and from a bed to a chair (including a wheelchair)?: Total Help needed standing up from a chair using your arms (e.g., wheelchair or bedside chair)?: Total Help needed to walk in hospital room?: Total Help needed climbing 3-5 steps with a railing? : Total 6 Click Score: 6    End of Session   Activity Tolerance: Treatment limited secondary to agitation Patient left: in bed;with call bell/phone within reach;with bed alarm set   PT Visit Diagnosis: Muscle weakness (generalized) (M62.81);History of falling (Z91.81);Unsteadiness on feet (R26.81);Other abnormalities of gait and mobility (R26.89);Difficulty in walking, not elsewhere classified (R26.2)     Time: 2458-0998 PT Time Calculation (min) (ACUTE ONLY): 19 min  Charges:  $Therapeutic Activity: 8-22 mins                     Dal Blew H. Owens Shark, PT, DPT, NCS 10/04/21, 2:46 PM [phone]
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
## 2021-10-04 NOTE — Progress Notes (Signed)
Occupational Therapy Treatment Patient Details Name: [name] [name] MRN: [id number] DOB: 10-10-1930 Today's Date: 10/04/2021   History of present illness 87 y.o. female  with medical history significant for dementia, DM with complications of stage IV chronic kidney disease, HTN and HDL as well as recent hospitalization 11/1-11/11 for sepsis secondary to UTI and COVID. Pt had just d/c from SNF (per pt request stating she wanted to go home with granddaughter) the day prior to ED arrival. Pt arrives to ED s/p fall where she found sitting on the floor for 3 hours.   OT comments  Pt seen for OT tx this date. Pt required MIN A to come sit EOB with VC for hand placement and use of bed rail to help. Pt sat EOB with intermittent VC to correct for R lateral lean in sitting with visual cues provided by mirror in front of patient during seated grooming tasks. Pt much more alert and able to follow simple commands this date, even begins to sing a song. MAX A for changing socks. MOD A +2 from std height bed for initial transfer, improving to MOD-MAX A x1 with bed elevated ~5". Pt tolerated standing ~25min while nurse tech changed bed linens. Pt progressing towards goals, cognition improved this date, allowing for additional progress.    Recommendations for follow up therapy are one component of a multi-disciplinary discharge planning process, led by the attending physician.  Recommendations may be updated based on patient status, additional functional criteria and insurance authorization.    Follow Up Recommendations  Skilled nursing-short term rehab (<3 hours/day)    Assistance Recommended at Discharge Frequent or constant Supervision/Assistance  Equipment Recommendations  BSC/3in1    Recommendations for Other Services      Precautions / Restrictions Precautions Precautions: Fall Restrictions Weight Bearing Restrictions: No       Mobility Bed Mobility Overal bed mobility: Needs Assistance Bed Mobility:  Supine to Sit;Sit to Supine     Supine to sit: Min assist;HOB elevated Sit to supine: Max assist   General bed mobility comments: cues for sequencing, bed rail    Transfers Overall transfer level: Needs assistance Equipment used: Rolling walker (2 wheels) Transfers: Sit to/from Stand Sit to Stand: Mod assist;+2 physical assistance;From elevated surface;Max assist           General transfer comment: Initial transfer from std height bed MOD A x2 (with nurse tech) + VC for RW, hand/foot placement; 2nd attempt from elevated bed (~+5") requiring MOD-MAX A x1     Balance Overall balance assessment: Needs assistance Sitting-balance support: No upper extremity supported;Feet supported Sitting balance-Leahy Scale: Fair Sitting balance - Comments: initial posterior and   Standing balance support: Bilateral upper extremity supported Standing balance-Leahy Scale: Fair Standing balance comment: initial posterior lean requiring VC and CGA-Min A to correct                           ADL either performed or assessed with clinical judgement   ADL Overall ADL's : Needs assistance/impaired     Grooming: Sitting;Supervision/safety;Set up;Wash/dry face                                      Extremity/Trunk Assessment              Vision       Perception     Praxis  Cognition Arousal/Alertness: Awake/alert Behavior During Therapy: WFL for tasks assessed/performed Overall Cognitive Status: No family/caregiver present to determine baseline cognitive functioning                                 General Comments: oriented to self, follows simple commands with cues/time          Exercises Other Exercises Other Exercises: STS ADL transfers EOB +RW during bed linens change by nurse tech, tolerated standing ~37min prior to need to sit again   Shoulder Instructions       General Comments      Pertinent Vitals/ Pain       Pain  Assessment: No/denies pain  Home Living                                          Prior Functioning/Environment              Frequency  Min 2X/week        Progress Toward Goals  OT Goals(current goals can now be found in the care plan section)  Progress towards OT goals: Progressing toward goals  Acute Rehab OT Goals Patient Stated Goal: to get out of here OT Goal Formulation: With patient Time For Goal Achievement: 10/12/21 Potential to Achieve Goals: Good  Plan Discharge plan remains appropriate;Frequency remains appropriate    Co-evaluation                 AM-PAC OT "6 Clicks" Daily Activity     Outcome Measure   Help from another person eating meals?: A Little Help from another person taking care of personal grooming?: A Little Help from another person toileting, which includes using toliet, bedpan, or urinal?: A Lot Help from another person bathing (including washing, rinsing, drying)?: A Lot Help from another person to put on and taking off regular upper body clothing?: A Little Help from another person to put on and taking off regular lower body clothing?: A Lot 6 Click Score: 15    End of Session Equipment Utilized During Treatment: Gait belt;Rolling walker (2 wheels)  OT Visit Diagnosis: Other symptoms and signs involving cognitive function;Muscle weakness (generalized) (M62.81)   Activity Tolerance Patient tolerated treatment well   Patient Left in bed;with call bell/phone within reach;with bed alarm set   Nurse Communication          Time: 4707-6151 OT Time Calculation (min): 23 min  Charges: OT General Charges $OT Visit: 1 Visit OT Treatments $Self Care/Home Management : 23-37 mins  Ardeth Perfect., MPH, MS, OTR/L ascom [phone] 10/04/21, 1:06 PM

## 2021-10-04 NOTE — TOC Progression Note (Signed)
Transition of Care [facility]) - Progression Note    Patient Details  Name: [name] [name] MRN: [id number] Date of Birth: 05/29/1930  [facility] CM/SW Contact  Shelbie Hutching, RN Phone Number: 10/04/2021, 12:44 PM  Clinical Narrative:    Donella Stade at [facility] reports that she has emailed the admission paperwork to [facility] but has not gotten it back yet.   Patient was running a fever this morning and MD has ordered CT of the chest.      Barriers to Discharge: Continued Medical Work up  Expected Discharge Plan and Services   In-house Referral: Clinical Social Work Discharge Planning Services: CM Consult Post Acute Care Choice: [place] arrangements for the past 2 months: [place], [place]                 DME Arranged: N/A DME Agency: NA       HH Arranged: NA           Social Determinants of Health (SDOH) Interventions    Readmission Risk Interventions Readmission Risk Prevention Plan 09/12/2021  Transportation Screening Complete  Medication Review Press photographer) Complete  SW Recovery Care/Counseling Consult Complete  Palliative Care Screening Complete  Skilled Nursing Facility Complete  Some recent data might be hidden

## 2021-10-05 DIAGNOSIS — G9341 Metabolic encephalopathy: Secondary | ICD-10-CM | POA: Diagnosis not present

## 2021-10-05 MED ORDER — HALOPERIDOL LACTATE 5 MG/ML IJ SOLN: 2.0000 mg | Freq: Four times a day (QID) | INTRAMUSCULAR | Status: DC | PRN

## 2021-10-05 MED ORDER — OSELTAMIVIR PHOSPHATE 30 MG PO CAPS
30.0000 mg | ORAL_CAPSULE | Freq: Every day | ORAL | Status: DC
  Administered 2021-10-05 – 2021-10-08 (×4): 30 mg via ORAL
  Filled 2021-10-05 (×4): qty 1

## 2021-10-05 NOTE — Progress Notes (Addendum)
Patient showing signs of confusion this afternoon. Possible sundowning. stating I am holding her hostage. Stating she has to leave. Throwing blankets off bed. Yelling at NT and Nurse when trying to do patient care. Acting as if bell call is the phone. Multiple attempts to reorient patient to no success.  Notified MD
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
## 2021-10-06 DIAGNOSIS — G9341 Metabolic encephalopathy: Secondary | ICD-10-CM | POA: Diagnosis not present

## 2021-10-06 LAB — CBC
HCT: 28.7 % — ABNORMAL LOW (ref 36.0–46.0)
Hemoglobin: 9 g/dL — ABNORMAL LOW (ref 12.0–15.0)
MCH: 30.2 pg (ref 26.0–34.0)
MCHC: 31.4 g/dL (ref 30.0–36.0)
MCV: 96.3 fL (ref 80.0–100.0)
Platelets: 197 10*3/uL (ref 150–400)
RBC: 2.98 MIL/uL — ABNORMAL LOW (ref 3.87–5.11)
RDW: 15.1 % (ref 11.5–15.5)
WBC: 5.5 10*3/uL (ref 4.0–10.5)
nRBC: 0 % (ref 0.0–0.2)

## 2021-10-06 LAB — BASIC METABOLIC PANEL
Anion gap: 5 (ref 5–15)
BUN: 46 mg/dL — ABNORMAL HIGH (ref 8–23)
CO2: 23 mmol/L (ref 22–32)
Calcium: 9.1 mg/dL (ref 8.9–10.3)
Chloride: 112 mmol/L — ABNORMAL HIGH (ref 98–111)
Creatinine, Ser: 1.8 mg/dL — ABNORMAL HIGH (ref 0.44–1.00)
GFR, Estimated: 27 mL/min — ABNORMAL LOW (ref >60)
Glucose, Bld: 122 mg/dL — ABNORMAL HIGH (ref 70–99)
Potassium: 5.1 mmol/L (ref 3.5–5.1)
Sodium: 140 mmol/L (ref 135–145)

## 2021-10-06 LAB — MAGNESIUM: Magnesium: 1.7 mg/dL (ref 1.7–2.4)

## 2021-10-06 MED ORDER — ENOXAPARIN SODIUM 40 MG/0.4ML IJ SOSY
40.0000 mg | PREFILLED_SYRINGE | INTRAMUSCULAR | Status: DC
  Administered 2021-10-06 – 2021-10-08 (×3): 40 mg via SUBCUTANEOUS
  Filled 2021-10-06 (×3): qty 0.4

## 2021-10-06 MED ORDER — ENOXAPARIN SODIUM 30 MG/0.3ML IJ SOSY: 30.0000 mg | PREFILLED_SYRINGE | INTRAMUSCULAR | Status: DC

## 2021-10-06 MED ORDER — TRAZODONE HCL 50 MG PO TABS
25.0000 mg | ORAL_TABLET | Freq: Every evening | ORAL | Status: DC | PRN
  Administered 2021-10-06 (×2): 25 mg via ORAL
  Filled 2021-10-06 (×2): qty 1

## 2021-10-06 MED ORDER — HALOPERIDOL LACTATE 5 MG/ML IJ SOLN
2.0000 mg | Freq: Four times a day (QID) | INTRAMUSCULAR | Status: DC | PRN
  Administered 2021-10-06: 2 mg via INTRAMUSCULAR
  Filled 2021-10-06: qty 1

## 2021-10-06 NOTE — TOC Progression Note (Addendum)
Transition of Care [facility]) - Progression Note    Patient Details  Name: [name] [name] MRN: [id number] Date of Birth: 1930-05-10  [facility] CM/SW Contact  Boris Sharper, LCSW Phone Number: 10/06/2021, 10:56 AM  Clinical Narrative:    CSW attempted to call Crystal at [facility] as well as pt's granddaughter to see if paperwork had been filled out but received no answer from either one. CSW left a Advertising account executive for [employer] and pt's granddaughter. CSW will continue to follow up to see if paperwork has been filled out.  4pm: tried to call granddaughter again, no answer, left voicemail     Barriers to Discharge: Continued Medical Work up  Expected Discharge Plan and Services   In-house Referral: Clinical Social Work Discharge Planning Services: CM Consult Post Acute Care Choice: [place] arrangements for the past 2 months: [place], [place]                 DME Arranged: N/A DME Agency: NA       HH Arranged: NA           Social Determinants of Health (SDOH) Interventions    Readmission Risk Interventions Readmission Risk Prevention Plan 09/12/2021  Transportation Screening Complete  Medication Review Press photographer) Complete  SW Recovery Care/Counseling Consult Complete  Palliative Care Screening Complete  Skilled Nursing Facility Complete  Some recent data might be hidden
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
## 2021-10-06 NOTE — Progress Notes (Signed)
Patient lying in bed, has removed gown and covers and thrown on floor, removed PIV, confused and agitated. Notified Mansy, MD - prn Haldol changed to IM route and order for IV team placed.   IV team requesting mitts be placed on patient as she has removed several during this admission. Will apply after access is gained.

## 2021-10-07 DIAGNOSIS — G9341 Metabolic encephalopathy: Secondary | ICD-10-CM | POA: Diagnosis not present
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
## 2021-10-07 NOTE — Progress Notes (Signed)
Occupational Therapy Treatment Patient Details Name: [name] [name] MRN: [id number] DOB: February 04, 1930 Today's Date: 10/07/2021   History of present illness 87 y.o. female  with medical history significant for dementia, DM with complications of stage IV chronic kidney disease, HTN and HDL as well as recent hospitalization 11/1-11/11 for sepsis secondary to UTI and COVID. Pt had just d/c from SNF (per pt request stating she wanted to go home with granddaughter) the day prior to ED arrival. Pt arrives to ED s/p fall where she found sitting on the floor for 3 hours.   OT comments  Pt seen for OT treatment on this date. Upon arrival to room, pt asleep however easily awoken following light touch. Pt initially a bit lethargic however alertness improved with functional tasks and agreeable throughout. Pt A&Ox3 and reporting no pain. Pt currently requires MIN GUARD for bed mobility, SET-UP A- MIN A for UB bathing/dressing, MAX A for sit>stand transfers with RW, and MIN A for lateral steps with RW d/t decreased balance, decreased strength, and impaired cognition. Pt left seated upright in chair with breakfast tray. Pt is making good progress toward goals and continues to benefit from skilled OT services to maximize return to PLOF and minimize risk of future falls, injury, caregiver burden, and readmission. Will continue to follow POC. Discharge recommendation remains appropriate.     Recommendations for follow up therapy are one component of a multi-disciplinary discharge planning process, led by the attending physician.  Recommendations may be updated based on patient status, additional functional criteria and insurance authorization.    Follow Up Recommendations  Skilled nursing-short term rehab (<3 hours/day)    Assistance Recommended at Discharge Frequent or constant Supervision/Assistance  Equipment Recommendations  BSC/3in1    Recommendations for Other Services      Precautions / Restrictions  Precautions Precautions: Fall Restrictions Weight Bearing Restrictions: No       Mobility Bed Mobility Overal bed mobility: Needs Assistance Bed Mobility: Supine to Sit     Supine to sit: Min guard;HOB elevated     General bed mobility comments: Requires verbal cues for scooting hips toward EOB    Transfers Overall transfer level: Needs assistance Equipment used: Rolling walker (2 wheels) Transfers: Sit to/from Stand Sit to Stand: Max assist                 Balance Overall balance assessment: Needs assistance Sitting-balance support: No upper extremity supported;Feet supported Sitting balance-Leahy Scale: Good Sitting balance - Comments: Requires supervision for sitting EOB and reaching within BOS during UB bathing/dressing   Standing balance support: Bilateral upper extremity supported;During functional activity;Reliant on assistive device for balance Standing balance-Leahy Scale: Poor Standing balance comment: initial posterior lean requiring verbal cues and MIN A to correct                           ADL either performed or assessed with clinical judgement   ADL Overall ADL's : Needs assistance/impaired Eating/Feeding: Minimal assistance;Sitting Eating/Feeding Details (indicate cue type and reason): MIN A for opening food containers Grooming: Sitting;Supervision/safety;Set up;Wash/dry face   Upper Body Bathing: Supervision/ safety;Set up;Sitting       Upper Body Dressing : Minimal assistance;Sitting Upper Body Dressing Details (indicate cue type and reason): to don/doff gown. MIN A for donning over elbows/shoulders                          Cognition Arousal/Alertness: Awake/alert Behavior During  Therapy: Flat affect Overall Cognitive Status: No family/caregiver present to determine baseline cognitive functioning                                 General Comments: Alert and oriented to self, place, and month/year.  Disoriented to situation. Pt initially a bit lethargic however alertness improved with functional tasks. Agreeable throughout                     Pertinent Vitals/ Pain       Pain Assessment: No/denies pain         Frequency  Min 2X/week        Progress Toward Goals  OT Goals(current goals can now be found in the care plan section)  Progress towards OT goals: Progressing toward goals  Acute Rehab OT Goals Patient Stated Goal: to get out of here OT Goal Formulation: With patient Time For Goal Achievement: 10/12/21 Potential to Achieve Goals: Good  Plan Discharge plan remains appropriate;Frequency remains appropriate       AM-PAC OT "6 Clicks" Daily Activity     Outcome Measure   Help from another person eating meals?: A Little Help from another person taking care of personal grooming?: A Little Help from another person toileting, which includes using toliet, bedpan, or urinal?: A Lot Help from another person bathing (including washing, rinsing, drying)?: A Lot Help from another person to put on and taking off regular upper body clothing?: A Little Help from another person to put on and taking off regular lower body clothing?: A Lot 6 Click Score: 15    End of Session Equipment Utilized During Treatment: Gait belt;Rolling walker (2 wheels)  OT Visit Diagnosis: Other symptoms and signs involving cognitive function;Muscle weakness (generalized) (M62.81)   Activity Tolerance Patient tolerated treatment well   Patient Left in chair;with call bell/phone within reach;with chair alarm set   Nurse Communication Mobility status        Time: 5366-4403 OT Time Calculation (min): 27 min  Charges: OT General Charges $OT Visit: 1 Visit OT Treatments $Self Care/Home Management : 23-37 mins  Fredirick Maudlin, OTR/L [place]

## 2021-10-07 NOTE — TOC Progression Note (Signed)
Transition of Care [facility]) - Progression Note    Patient Details  Name: [name] [name] MRN: [id number] Date of Birth: August 16, 1930  Transition [facility] Care [facility]) CM/SW Contact  Shelbie Hutching, RN Phone Number: 10/07/2021, 3:58 PM  Clinical Narrative:    Granddaughter returned call this afternoon, she is supposed to be going to sign admission paperwork this afternoon around 4.  Plan for discharge to [facility] tomorrow.      Barriers to Discharge: Family Issues  Expected Discharge Plan and Services   In-house Referral: Clinical Social Work Discharge Planning Services: CM Consult Post Acute Care Choice: [place] Living arrangements for the past 2 months: [place], [place]                 DME Arranged: N/A DME Agency: NA       HH Arranged: NA           Social Determinants of Health (SDOH) Interventions    Readmission Risk Interventions Readmission Risk Prevention Plan 09/12/2021  Transportation Screening Complete  Medication Review Press photographer) Complete  SW Recovery Care/Counseling Consult Complete  Palliative Care Screening Complete  Skilled Nursing Facility Complete  Some recent data might be hidden

## 2021-10-07 NOTE — TOC Progression Note (Signed)
Transition of Care [facility]) - Progression Note    Patient Details  Name: [name] [name] MRN: [id number] Date of Birth: 01/19/1930  Transition of Care [facility]) CM/SW Contact  Shelbie Hutching, RN Phone Number: 10/07/2021, 11:12 AM  Clinical Narrative:     Message left with granddaughter and daughter this morning explaining the need for them to sign the admission paperwork at [facility].  Crystal at [facility] has not heard from them and has not received the paper work back even though she did email it to [employer].      Barriers to Discharge: Family Issues  Expected Discharge Plan and Services   In-house Referral: Clinical Social Work Discharge Planning Services: CM Consult Post Acute Care Choice: [place] Living arrangements for the past 2 months: [place], [place]                 DME Arranged: N/A DME Agency: NA       HH Arranged: NA           Social Determinants of Health (SDOH) Interventions    Readmission Risk Interventions Readmission Risk Prevention Plan 09/12/2021  Transportation Screening Complete  Medication Review Press photographer) Complete  SW Recovery Care/Counseling Consult Complete  Palliative Care Screening Complete  Skilled Nursing Facility Complete  Some recent data might be hidden

## 2021-10-07 NOTE — Progress Notes (Signed)
PT Cancellation Note  Patient Details Name: [name] [name] MRN: [id number] DOB: December 04, 1929   Cancelled Treatment:    Reason Eval/Treat Not Completed: Fatigue/lethargy limiting ability to participate.  Pt sleeping in recliner upon PT arrival.  Pt responded (talking to therapist) with vc's and tactile cues but pt very rarely opened her eyes during attempted session.  Pt reporting wanting to go for a walk but therapist unable to get pt to initiate movement d/t pt being too drowsy (nurse notified and aware).  Will re-attempt PT session at a later date/time.   Leitha Bleak, PT 10/07/21, 10:54 AM

## 2021-10-07 NOTE — Evaluation (Signed)
Clinical/Bedside Swallow Evaluation Patient Details  Name: [name] [name] MRN: [id number] Date of Birth: 01/19/1930  Today's Date: 10/07/2021 Time: SLP Start Time (ACUTE ONLY): 1208 SLP Stop Time (ACUTE ONLY): 1235 SLP Time Calculation (min) (ACUTE ONLY): 27 min  Past Medical History:  Past Medical History:  Diagnosis Date   Diabetes mellitus    Hyperlipidemia    Hypertension    Past Surgical History:  Past Surgical History:  Procedure Laterality Date   APPENDECTOMY     CHOLECYSTECTOMY     COLONOSCOPY  06/23/2012   Procedure: COLONOSCOPY;  Surgeon: Lear Ng, MD;  Location: [place];  Service: Endoscopy;  Laterality: N/A;   ESOPHAGOGASTRODUODENOSCOPY  06/23/2012   Procedure: ESOPHAGOGASTRODUODENOSCOPY (EGD);  Surgeon: Lear Ng, MD;  Location: [facility] ENDOSCOPY;  Service: Endoscopy;  Laterality: N/A;   HPI:  Per 72 H&P "[name] [name] is a 87 y.o. female with medical history significant for Dementia, diabetes mellitus with complications of stage IV chronic kidney disease, hypertension, dyslipidemia, and Falls who presents to the emergency room from the skilled nursing facility where she resides for evaluation of change in mental status and increased somnolence.  She was found to have COVID+, chest x-ray showed a central pulmonary vessel prominence.  Chest CT: acute left anterolateral eighth and ninth rib fractures.  Subacute left anterior third, fifth, and sixth rib fractures.  New small bilateral pleural effusions.  Similar diffuse scattered pulmonary nodules and tree-in-bud  nodularity bilaterally, largest nodule measuring 8 mm in the right  lower lobe, possibly representing a chronic atypical infectious  process as seen on multiple prior exams. [name] [name] is COVID+.  Patient was also found to have abnormal urine, placed on Rocephin for UTI.  [name] [name] has had multiple visits to the ED in recent 3 months." CT Chest 10/04/21 "There are scattered subcentimeter nodular densities in the mid and   lower lung fields suggesting resolving pneumonia or underlying  neoplastic process. There are no new focal infiltrates or signs of  alveolar pulmonary edema. There is interval clearing of small  bilateral pleural effusions.     There is ectasia of main pulmonary artery suggesting pulmonary  arterial hypertension. Small hiatal hernia. Other findings as  described in the body of the report."    Assessment / Plan / Recommendation  Clinical Impression  Repeat SLP consult for clinical swallowing evaluation received. MD requesting diet upgrade. [name] [name] seen earlier this admission with recommendation for pureed diet with thin liquids and safe swallowing strategies/aspiratoin precuations (see notes for details).   Based on today's evaluation, [name] [name] presents with s/sx mild oral dysphagia c/b prolonged mastication of solids likely due to dental status. Complete oral clearance achieved without use of liquid wash. [name] [name]'s pharyngeal swallow appears WFL per clinical assessment. No overt s/sx pharyngeal dysphagia. To palpation, timely swallow initiation and seemingly adequate laryngeal elevation. No appreciable change to vocal quality across trials.   Recommend diet upgrade to Dysphagia 2 (chopped) diet with Thin Liquids and safe swallowing strategies/aspiration precautions as outlined below.   [name] [name] remains at increased risk for aspiration/aspiration PNA given mental status, dental status, and multiple medical comorbidities.   Given clinical risk factors, SLP to f/u x1 for diet tolerance.   Pt, RN, and NT made aware of diet recommendations, safe swallowing strategies/aspiration precautions, and SLP POC. Likely reduced understanding by [name] [name] given overall mental status. Diet recommendations updated on communication board in [name] [name]'s room.  SLP Visit Diagnosis: Dysphagia, oral phase (R13.11)    Aspiration Risk  Mild aspiration risk;Risk for inadequate nutrition/hydration  Diet Recommendation Dysphagia 2 (Fine chop);Thin liquid    Medication Administration: Crushed with puree Supervision:  ([name] [name] requires set up for self feeding) Compensations: Slow rate;Small sips/bites Postural Changes: Seated upright at 90 degrees;Remain upright for at least 30 minutes after po intake    Other  Recommendations Oral Care Recommendations: Oral care BID;Oral care before and after PO;Staff/trained caregiver to provide oral care    Recommendations for follow up therapy are one component of a multi-disciplinary discharge planning process, led by the attending physician.  Recommendations may be updated based on patient status, additional functional criteria and insurance authorization.  Follow up Recommendations Home health SLP      Assistance Recommended at Discharge Frequent or constant Supervision/Assistance  Functional Status Assessment Patient has had a recent decline in their functional status and demonstrates the ability to make significant improvements in function in a reasonable and predictable amount of time.  Frequency and Duration min 2x/week  2 weeks       Prognosis Prognosis for Safe Diet Advancement: Fair Barriers to Reach Goals: Cognitive deficits;Time post onset;Severity of deficits;Behavior      Swallow Study   General Date of Onset: 09/10/21 HPI: Per 3 H&P "[name] [name] is a 87 y.o. female with medical history significant for Dementia, diabetes mellitus with complications of stage IV chronic kidney disease, hypertension, dyslipidemia, and Falls who presents to the emergency room from the skilled nursing facility where she resides for evaluation of change in mental status and increased somnolence.  She was found to have COVID+, chest x-ray showed a central pulmonary vessel prominence.  Chest CT: acute left anterolateral eighth and ninth rib fractures.  Subacute left anterior third, fifth, and sixth rib fractures.  New small bilateral pleural effusions.  Similar diffuse scattered pulmonary nodules and tree-in-bud   nodularity bilaterally, largest nodule measuring 8 mm in the right  lower lobe, possibly representing a chronic atypical infectious  process as seen on multiple prior exams. [name] [name] is COVID+.  Patient was also found to have abnormal urine, placed on Rocephin for UTI.  [name] [name] has had multiple visits to the ED in recent 3 months." CT Chest 10/04/21 "There are scattered subcentimeter nodular densities in the mid and  lower lung fields suggesting resolving pneumonia or underlying  neoplastic process. There are no new focal infiltrates or signs of  alveolar pulmonary edema. There is interval clearing of small  bilateral pleural effusions.     There is ectasia of main pulmonary artery suggesting pulmonary  arterial hypertension. Small hiatal hernia. Other findings as  described in the body of the report." Type of Study: Bedside Swallow Evaluation Previous Swallow Assessment: clinical swallowing evaluation completed this admission with recommendation for pureed diet with thin liquids Diet Prior to this Study: Dysphagia 1 (puree);Thin liquids Temperature Spikes Noted: No Respiratory Status: Room air History of Recent Intubation: No Behavior/Cognition: Alert;Cooperative;Pleasant mood;Confused;Distractible;Requires cueing Oral Cavity Assessment: Within Functional Limits Oral Care Completed by SLP: Yes Oral Cavity - Dentition: Edentulous Vision: Functional for self-feeding Self-Feeding Abilities: Needs set up Patient Positioning: Upright in chair Baseline Vocal Quality: Normal Volitional Cough: Cognitively unable to elicit Volitional Swallow: Unable to elicit    Oral/Motor/Sensory Function Overall Oral Motor/Sensory Function: Generalized oral weakness   Thin Liquid Thin Liquid: Within functional limits Presentation: Cup;Straw    Puree Puree: Within functional limits Presentation: Spoon   Solid     Solid: Impaired Presentation: Self Fed Oral Phase Impairments: Impaired mastication Oral Phase Functional  Implications: Impaired mastication;Prolonged oral transit  Cherrie Gauze, M.S., [facility] [phone] (ASCOM)  Clearnce Sorrel Aurelio Mccamy 10/07/2021,2:11 PM

## 2021-10-08 DIAGNOSIS — G9341 Metabolic encephalopathy: Secondary | ICD-10-CM | POA: Diagnosis not present

## 2021-10-08 MED ORDER — OSELTAMIVIR PHOSPHATE 30 MG PO CAPS: 30.0000 mg | ORAL_CAPSULE | Freq: Every day | ORAL | 0 refills | Status: AC

## 2021-10-08 NOTE — TOC Progression Note (Addendum)
Transition of Care [facility]) - Progression Note    Patient Details  Name: [name] [name] MRN: [id number] Date of Birth: 06-14-1930  Transition of Care [facility]) CM/SW [place], LCSW Phone Number: 10/08/2021, 9:49 AM  Clinical Narrative: Insurance authorization expired yesterday. Uploaded clinicals into [place] portal to restart. Patient tested positive for flu on 11/25. SNF admissions coordinator has been notified and that per RN she is not experiencing symptoms but is still taking Tamiflu. They are still able to accept today if auth approved.    11:16 am: Left voicemail for granddaughter.    Barriers to Discharge: Family Issues  Expected Discharge Plan and Services   In-house Referral: Clinical Social Work Discharge Planning Services: CM Consult Post Acute Care Choice: [place] Living arrangements for the past 2 months: [place], [place] Expected Discharge Date: 10/08/21               DME Arranged: N/A DME Agency: NA       HH Arranged: NA           Social Determinants of Health (SDOH) Interventions    Readmission Risk Interventions Readmission Risk Prevention Plan 09/12/2021  Transportation Screening Complete  Medication Review Press photographer) Complete  SW Recovery Care/Counseling Consult Complete  Palliative Care Screening Complete  Skilled Nursing Facility Complete  Some recent data might be hidden

## 2021-10-08 NOTE — Progress Notes (Signed)
Called granddaughter and told her that patient is being transferred to [facility] today and also gave her room number for patient. Gathered all belongings and sent them with patient.   Called report to [facility] and gave nurse report, Paschal. Patient taken via EMS to facility.

## 2021-10-08 NOTE — TOC Transition Note (Signed)
Transition of Care [facility]) - CM/SW Discharge Note   Patient Details  Name: [name] [name] MRN: [id number] Date of Birth: May 05, 1930  Transition of Care [facility]) CM/SW Contact:  Candie Chroman, LCSW Phone Number: 10/08/2021, 1:52 PM   Clinical Narrative:  Patient has orders to discharge home today. Per [facility] representative, patient's insurance approval is still valid through tonight at 11:59 pm. RN will call report to [phone] (Room [place]). EMS transport has been arranged. No further concerns. CSW signing off.   Final next level of care: Skilled Nursing Facility Barriers to Discharge: Barriers Resolved   Patient Goals and CMS Choice Patient states their goals for this hospitalization and ongoing recovery are:: GRanddaughter agrees with patient going to [facility] again to their Dementia Unit CMS Medicare.gov Compare Post Acute Care list provided to:: Patient Represenative (must comment) Choice offered to / list presented to : Adult Children  Discharge Placement   Existing PASRR number confirmed : 09/27/21          Patient chooses bed at: [facility] Patient to be transferred to facility by: EMS Name of family member notified: Alric Quan Patient and family notified of of transfer: 10/08/21  Discharge Plan and Services In-house Referral: Clinical Social Work Discharge Planning Services: CM Consult Post Acute Care Choice: [place]          DME Arranged: N/A DME Agency: NA       HH Arranged: NA          Social Determinants of Health (SDOH) Interventions     Readmission Risk Interventions Readmission Risk Prevention Plan 09/12/2021  Transportation Screening Complete  Medication Review Press photographer) Complete  SW Recovery Care/Counseling Consult Complete  Palliative Care Screening Complete  Skilled Nursing Facility Complete  Some recent data might be hidden
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
## 2021-10-08 NOTE — Care Management Important Message (Signed)
Important Message  Patient Details  Name: Ninah Moccio MRN: [id number] Date of Birth: 20-Apr-1930   Medicare Important Message Given:  Other (see comment)  Left Message with granddaughter, Bud Face [phone] to call me back at her convenience to review the Important Message from Medicare.  Will await a return call.    Juliann Pulse A Audryanna Zurita 10/08/2021, 9:30 AM

## 2021-10-09 NOTE — Care Management Important Message (Signed)
Important Message  Patient Details  Name: [name] [name] MRN: [id number] Date of Birth: [birth year]   Medicare Important Message Given:  Yes  Late entry: Granddaughter, Bud Face returned my call @ 1:02 pm and left a voice message that she was in agreement with the discharge.     Juliann Pulse A Ellanora Rayborn 10/09/2021, 7:53 AM

## 2021-10-13 ENCOUNTER — Other Ambulatory Visit: Payer: Self-pay

## 2021-10-13 ENCOUNTER — Observation Stay (HOSPITAL_COMMUNITY)
Admission: EM | Admit: 2021-10-13 | Discharge: 2021-10-14 | Disposition: A | Discharge status: Home / Self Care | Payer: Medicare Other | Attending: Internal Medicine | Admitting: Internal Medicine

## 2021-10-13 ENCOUNTER — Emergency Department (HOSPITAL_COMMUNITY): Payer: Medicare Other

## 2021-10-13 ENCOUNTER — Encounter (HOSPITAL_COMMUNITY): Payer: Self-pay | Admitting: Emergency Medicine

## 2021-10-13 DIAGNOSIS — E1122 Type 2 diabetes mellitus with diabetic chronic kidney disease: Secondary | ICD-10-CM | POA: Diagnosis not present

## 2021-10-13 DIAGNOSIS — Z8616 Personal history of COVID-19: Secondary | ICD-10-CM | POA: Insufficient documentation

## 2021-10-13 DIAGNOSIS — W19XXXA Unspecified fall, initial encounter: Secondary | ICD-10-CM | POA: Diagnosis not present

## 2021-10-13 DIAGNOSIS — E875 Hyperkalemia: Secondary | ICD-10-CM | POA: Diagnosis not present

## 2021-10-13 DIAGNOSIS — F0390 Unspecified dementia without behavioral disturbance: Secondary | ICD-10-CM | POA: Insufficient documentation

## 2021-10-13 DIAGNOSIS — E785 Hyperlipidemia, unspecified: Secondary | ICD-10-CM | POA: Diagnosis present

## 2021-10-13 DIAGNOSIS — I129 Hypertensive chronic kidney disease with stage 1 through stage 4 chronic kidney disease, or unspecified chronic kidney disease: Secondary | ICD-10-CM | POA: Diagnosis not present

## 2021-10-13 DIAGNOSIS — N184 Chronic kidney disease, stage 4 (severe): Secondary | ICD-10-CM | POA: Insufficient documentation

## 2021-10-13 DIAGNOSIS — Z79899 Other long term (current) drug therapy: Secondary | ICD-10-CM | POA: Insufficient documentation

## 2021-10-13 DIAGNOSIS — S0101XA Laceration without foreign body of scalp, initial encounter: Principal | ICD-10-CM | POA: Insufficient documentation

## 2021-10-13 DIAGNOSIS — F03918 Unspecified dementia, unspecified severity, with other behavioral disturbance: Secondary | ICD-10-CM | POA: Diagnosis present

## 2021-10-13 DIAGNOSIS — I10 Essential (primary) hypertension: Secondary | ICD-10-CM | POA: Diagnosis present

## 2021-10-13 DIAGNOSIS — Z23 Encounter for immunization: Secondary | ICD-10-CM | POA: Diagnosis not present

## 2021-10-13 DIAGNOSIS — E1165 Type 2 diabetes mellitus with hyperglycemia: Secondary | ICD-10-CM

## 2021-10-13 DIAGNOSIS — N179 Acute kidney failure, unspecified: Secondary | ICD-10-CM | POA: Diagnosis present

## 2021-10-13 DIAGNOSIS — F0394 Unspecified dementia, unspecified severity, with anxiety: Secondary | ICD-10-CM | POA: Diagnosis present

## 2021-10-13 DIAGNOSIS — E119 Type 2 diabetes mellitus without complications: Secondary | ICD-10-CM

## 2021-10-13 DIAGNOSIS — S0990XA Unspecified injury of head, initial encounter: Secondary | ICD-10-CM | POA: Diagnosis present

## 2021-10-13 LAB — BASIC METABOLIC PANEL
Anion gap: 7 (ref 5–15)
Anion gap: 7 (ref 5–15)
BUN: 49 mg/dL — ABNORMAL HIGH (ref 8–23)
BUN: 48 mg/dL — ABNORMAL HIGH (ref 8–23)
CO2: 18 mmol/L — ABNORMAL LOW (ref 22–32)
CO2: 19 mmol/L — ABNORMAL LOW (ref 22–32)
Calcium: 9.2 mg/dL (ref 8.9–10.3)
Calcium: 9.2 mg/dL (ref 8.9–10.3)
Chloride: 112 mmol/L — ABNORMAL HIGH (ref 98–111)
Chloride: 114 mmol/L — ABNORMAL HIGH (ref 98–111)
Creatinine, Ser: 2.38 mg/dL — ABNORMAL HIGH (ref 0.44–1.00)
Creatinine, Ser: 2.11 mg/dL — ABNORMAL HIGH (ref 0.44–1.00)
GFR, Estimated: 19 mL/min — ABNORMAL LOW (ref >60)
GFR, Estimated: 22 mL/min — ABNORMAL LOW (ref >60)
Glucose, Bld: 97 mg/dL (ref 70–99)
Glucose, Bld: 124 mg/dL — ABNORMAL HIGH (ref 70–99)
Potassium: 6 mmol/L — ABNORMAL HIGH (ref 3.5–5.1)
Potassium: 4.7 mmol/L (ref 3.5–5.1)
Sodium: 137 mmol/L (ref 135–145)
Sodium: 140 mmol/L (ref 135–145)

## 2021-10-13 LAB — CBC WITH DIFFERENTIAL/PLATELET
Abs Immature Granulocytes: 0.12 10*3/uL — ABNORMAL HIGH (ref 0.00–0.07)
Basophils Absolute: 0 10*3/uL (ref 0.0–0.1)
Basophils Relative: 0 %
Eosinophils Absolute: 0.3 10*3/uL (ref 0.0–0.5)
Eosinophils Relative: 3 %
HCT: 33.5 % — ABNORMAL LOW (ref 36.0–46.0)
Hemoglobin: 10.2 g/dL — ABNORMAL LOW (ref 12.0–15.0)
Immature Granulocytes: 1 %
Lymphocytes Relative: 25 %
Lymphs Abs: 2.3 10*3/uL (ref 0.7–4.0)
MCH: 29.7 pg (ref 26.0–34.0)
MCHC: 30.4 g/dL (ref 30.0–36.0)
MCV: 97.7 fL (ref 80.0–100.0)
Monocytes Absolute: 1.3 10*3/uL — ABNORMAL HIGH (ref 0.1–1.0)
Monocytes Relative: 14 %
Neutro Abs: 5.1 10*3/uL (ref 1.7–7.7)
Neutrophils Relative %: 57 %
Platelets: 211 10*3/uL (ref 150–400)
RBC: 3.43 MIL/uL — ABNORMAL LOW (ref 3.87–5.11)
RDW: 15.2 % (ref 11.5–15.5)
WBC: 9 10*3/uL (ref 4.0–10.5)
nRBC: 0 % (ref 0.0–0.2)

## 2021-10-13 LAB — URINALYSIS, ROUTINE W REFLEX MICROSCOPIC
Bilirubin Urine: NEGATIVE
Glucose, UA: NEGATIVE mg/dL
Ketones, ur: NEGATIVE mg/dL
Nitrite: NEGATIVE
Protein, ur: 30 mg/dL — AB
Specific Gravity, Urine: 1.02 (ref 1.005–1.030)
pH: 5 (ref 5.0–8.0)

## 2021-10-13 LAB — CBG MONITORING, ED: Glucose-Capillary: 91 mg/dL (ref 70–99)

## 2021-10-13 MED ORDER — CALCIUM GLUCONATE-NACL 1-0.675 GM/50ML-% IV SOLN
1.0000 g | Freq: Once | INTRAVENOUS | Status: AC
  Administered 2021-10-13: 07:00:00 1000 mg via INTRAVENOUS
  Filled 2021-10-13: qty 50

## 2021-10-13 MED ORDER — ALBUTEROL SULFATE (2.5 MG/3ML) 0.083% IN NEBU
5.0000 mg | INHALATION_SOLUTION | Freq: Once | RESPIRATORY_TRACT | Status: AC
  Administered 2021-10-13: 07:00:00 5 mg via RESPIRATORY_TRACT
  Filled 2021-10-13: qty 6

## 2021-10-13 MED ORDER — LORAZEPAM 2 MG/ML IJ SOLN
1.0000 mg | Freq: Once | INTRAMUSCULAR | Status: AC
  Administered 2021-10-13: 07:00:00 1 mg via INTRAVENOUS

## 2021-10-13 MED ORDER — PANTOPRAZOLE SODIUM 40 MG PO TBEC
40.0000 mg | DELAYED_RELEASE_TABLET | Freq: Every day | ORAL | Status: DC
  Administered 2021-10-13 – 2021-10-14 (×2): 40 mg via ORAL
  Filled 2021-10-13 (×2): qty 1

## 2021-10-13 MED ORDER — ENOXAPARIN SODIUM 30 MG/0.3ML IJ SOSY
30.0000 mg | PREFILLED_SYRINGE | INTRAMUSCULAR | Status: DC
  Administered 2021-10-13 – 2021-10-14 (×2): 30 mg via SUBCUTANEOUS
  Filled 2021-10-13 (×2): qty 0.3

## 2021-10-13 MED ORDER — SODIUM CHLORIDE 0.9 % IV BOLUS
1000.0000 mL | Freq: Once | INTRAVENOUS | Status: AC
  Administered 2021-10-13: 07:00:00 1000 mL via INTRAVENOUS

## 2021-10-13 MED ORDER — SODIUM CHLORIDE 0.9 % IV BOLUS
500.0000 mL | Freq: Once | INTRAVENOUS | Status: AC
  Administered 2021-10-13: 20:00:00 500 mL via INTRAVENOUS

## 2021-10-13 MED ORDER — LORAZEPAM 2 MG/ML IJ SOLN
INTRAMUSCULAR | Status: AC
  Filled 2021-10-13: qty 1

## 2021-10-13 MED ORDER — ATORVASTATIN CALCIUM 10 MG PO TABS
20.0000 mg | ORAL_TABLET | Freq: Every day | ORAL | Status: DC
  Administered 2021-10-13 – 2021-10-14 (×2): 20 mg via ORAL
  Filled 2021-10-13 (×2): qty 2

## 2021-10-13 MED ORDER — TETANUS-DIPHTH-ACELL PERTUSSIS 5-2.5-18.5 LF-MCG/0.5 IM SUSY
0.5000 mL | PREFILLED_SYRINGE | Freq: Once | INTRAMUSCULAR | Status: AC
  Administered 2021-10-13: 02:00:00 0.5 mL via INTRAMUSCULAR
  Filled 2021-10-13: qty 0.5

## 2021-10-13 MED ORDER — HYDROXYZINE HCL 50 MG/ML IM SOLN
25.0000 mg | Freq: Once | INTRAMUSCULAR | Status: AC
  Administered 2021-10-13: 23:00:00 25 mg via INTRAMUSCULAR
  Filled 2021-10-13: qty 1

## 2021-10-13 MED ORDER — MEGESTROL ACETATE 400 MG/10ML PO SUSP
400.0000 mg | Freq: Every day | ORAL | Status: DC
  Administered 2021-10-13 – 2021-10-14 (×2): 400 mg via ORAL
  Filled 2021-10-13 (×2): qty 10

## 2021-10-13 MED ORDER — LORAZEPAM 2 MG/ML IJ SOLN: 1.0000 mg | Freq: Once | INTRAMUSCULAR | Status: DC

## 2021-10-13 MED ORDER — SODIUM ZIRCONIUM CYCLOSILICATE 10 G PO PACK
10.0000 g | PACK | Freq: Every day | ORAL | Status: AC
  Administered 2021-10-13: 08:00:00 10 g via ORAL
  Filled 2021-10-13: qty 1

## 2021-10-13 MED ORDER — INSULIN ASPART 100 UNIT/ML IV SOLN
5.0000 [IU] | Freq: Once | INTRAVENOUS | Status: AC
  Administered 2021-10-13: 07:00:00 5 [IU] via INTRAVENOUS
  Filled 2021-10-13: qty 0.05

## 2021-10-13 MED ORDER — SODIUM CHLORIDE 0.9 % IV SOLN: INTRAVENOUS | Status: AC

## 2021-10-13 MED ORDER — DEXTROSE 50 % IV SOLN
1.0000 | Freq: Once | INTRAVENOUS | Status: AC
  Administered 2021-10-13: 07:00:00 50 mL via INTRAVENOUS
  Filled 2021-10-13: qty 50

## 2021-10-13 MED ORDER — ALBUTEROL SULFATE (2.5 MG/3ML) 0.083% IN NEBU: 10.0000 mg | INHALATION_SOLUTION | Freq: Once | RESPIRATORY_TRACT | Status: DC

## 2021-10-13 MED ORDER — LIDOCAINE-EPINEPHRINE (PF) 2 %-1:200000 IJ SOLN
INTRAMUSCULAR | Status: AC
  Filled 2021-10-13: qty 20

## 2021-10-13 MED ORDER — LIDOCAINE-EPINEPHRINE 2 %-1:100000 IJ SOLN: 20.0000 mL | Freq: Once | INTRAMUSCULAR | Status: DC

## 2021-10-13 MED ORDER — SODIUM ZIRCONIUM CYCLOSILICATE 10 G PO PACK
10.0000 g | PACK | Freq: Once | ORAL | Status: AC
  Administered 2021-10-13: 07:00:00 10 g via ORAL
  Filled 2021-10-13: qty 1

## 2021-10-13 NOTE — ED Notes (Signed)
Patient noted to be attempting to get out of the bed, yelling, moaning and singing. When attempting to help patient, or perform care, patient gets agitated and begins to swing and kick. Patient laying in bed at this time yelling in her room. Mid-level messaged, awaiting orders

## 2021-10-13 NOTE — H&P (Signed)
History and Physical    [name] [name] GYK:[id number] DOB: [birth year] DOA: 10/13/2021  PCP: Beckie Salts, MD   Patient coming from: [facility].  I have personally briefly reviewed patient's old medical records in [place]  Chief Complaint: Fall.  HPI: [name] [name] is a 87 y.o. female with medical history significant of type 2 diabetes, hyperlipidemia, hypertension, stage IV CKD, class I obesity with a BMI of 32.98 kg/m, history of COVID-19 last month, diastolic dysfunction, depression, history of suicidal ideations, history of prolonged QT interval, syncope who was discharged 5 days ago after a 12-day of admission due to AKI, hyperkalemia, altered mental status and frequent falls is coming from her facility via EMS after she had a witnessed fall from her bed sustaining a small laceration to her frontal area.  The facility staff pelvis there were no changes in mental status.  Patient stated that she had a a nervous breakdown, turned over in bed and hit her head.  She is still sedated from the lorazepam, able to answer simple questions but unable to elaborate this time.  Denies headache, back, chest or abdominal pain.  ED Course: Initial vital signs were temperature 98.2 F, pulse 65, respiration 18, BP 124/68 mmHg and O2 sat 99% on room air.  The patient received 1000 mL NS bolus, Lokelma 10 g p.o., Td booster, 5 mg albuterol nebulizer treatment, calcium gluconate 1 g IVPB, insulin 5 units with 25 g of dextrose and 1 mg of lorazepam IVP prior to frontal wound suture.  Review of Systems: As per HPI otherwise all other systems reviewed and are negative.  Past Medical History:  Diagnosis Date   Diabetes mellitus    Hyperlipidemia    Hypertension     Past Surgical History:  Procedure Laterality Date   APPENDECTOMY     CHOLECYSTECTOMY     COLONOSCOPY  06/23/2012   Procedure: COLONOSCOPY;  Surgeon: Lear Ng, MD;  Location: [place];  Service: Endoscopy;   Laterality: N/A;   ESOPHAGOGASTRODUODENOSCOPY  06/23/2012   Procedure: ESOPHAGOGASTRODUODENOSCOPY (EGD);  Surgeon: Lear Ng, MD;  Location: [facility] ENDOSCOPY;  Service: Endoscopy;  Laterality: N/A;    Social History  reports that she has never smoked. She has never used smokeless tobacco. She reports that she does not drink alcohol and does not use drugs.  Allergies  Allergen Reactions   Alendronate     Other reaction(s): Other (See Comments) Poor renal function.    History reviewed. No pertinent family history.  Prior to Admission medications   Medication Sig Start Date End Date Taking? Authorizing Provider  atorvastatin (LIPITOR) 20 MG tablet Take 20 mg by mouth daily. 06/13/21  Yes [provider]  megestrol (MEGACE) 400 MG/10ML suspension Take 10 mLs (400 mg total) by mouth daily. 09/21/21  Yes Nolberto Hanlon, MD  pantoprazole (PROTONIX) 40 MG tablet Take 40 mg by mouth daily.   Yes [provider]  acetaminophen (TYLENOL) 325 MG tablet Take 2 tablets (650 mg total) by mouth every 6 (six) hours as needed for mild pain (or Fever >/= 101). 09/20/21   Nolberto Hanlon, MD  Vitamin D, Ergocalciferol, (DRISDOL) 1.25 MG (50000 UNIT) CAPS capsule Take 50,000 Units by mouth once a week. 06/06/21   [provider]   Physical Exam: Vitals:   10/13/21 0234 10/13/21 0536 10/13/21 0645 10/13/21 1030  BP: 109/64 107/68 119/78 101/77  Pulse: 62 72 90 89  Resp: 16 18 20 17   Temp:  SpO2: 99% 99% 100% 98%  Weight:      Height:       Constitutional: NAD, calm, comfortable Eyes: PERRL, lids and conjunctivae normal. ENMT: Mucous membranes are mildly dry.  Posterior pharynx clear of any exudate or lesions. Neck: normal, supple, no masses, no thyromegaly Respiratory: clear to auscultation bilaterally, no wheezing, no crackles. Normal respiratory effort. No accessory muscle use.  Cardiovascular: Regular rate and rhythm, no murmurs / rubs / gallops. No extremity edema.  2+ pedal pulses. No carotid bruits.  Abdomen: Obese, no distention.  Soft, no tenderness, no masses palpated. No hepatosplenomegaly. Bowel sounds positive.  Musculoskeletal: no clubbing / cyanosis. Good ROM, no contractures.  Relaxed muscle tone.  Skin: Frontal area suture. Neurologic: CN 2-12 grossly intact. Sensation intact, DTR normal. Strength 5/5 in all 4.  Psychiatric: The patient was still sedated but able to answer simple questions.  Labs on Admission: I have personally reviewed following labs and imaging studies  CBC: Recent Labs  Lab 10/13/21 0520  WBC 9.0  NEUTROABS 5.1  HGB 10.2*  HCT 33.5*  MCV 97.7  PLT 884   Basic Metabolic Panel: Recent Labs  Lab 10/13/21 0520  NA 137  K 6.0*  CL 112*  CO2 18*  GLUCOSE 97  BUN 49*  CREATININE 2.38*  CALCIUM 9.2   GFR: Estimated Creatinine Clearance: 17.8 mL/min (A) (by C-G formula based on SCr of 2.38 mg/dL (H)).  Liver Function Tests: No results for input(s): AST, ALT, ALKPHOS, BILITOT, PROT, ALBUMIN in the last 168 hours.  Radiological Exams on Admission: DG Chest 2 View  Result Date: 10/13/2021 CLINICAL DATA:  87 year old female status post fall. EXAM: CHEST - 2 VIEW COMPARISON:  Chest CT 10/04/2021 and earlier. FINDINGS: Semi upright AP and lateral views of the chest at 0350 hours. Chronically fractured left humerus with regional retained metallic foreign body redemonstrated. Lung volumes and mediastinal contours remain stable. Visualized tracheal air column is within normal limits. Stable ventilation. Allowing for portable technique the lungs are clear, although subcentimeter lower lung predominant pulmonary nodules were recently demonstrated by CT. No pneumothorax or pleural effusion. No acute osseous abnormality identified. Negative visible bowel gas. Stable cholecystectomy clips. IMPRESSION: 1. Stable ventilation from the CT last month which demonstrated lower lung predominant small pulmonary nodules suspicious for  atypical infection (such as MAI). 2. No acute traumatic injury identified. Chronic left humerus fracture with retained metallic foreign bodies. Electronically Signed   By: Genevie Ann M.D.   On: 10/13/2021 04:03   DG Pelvis 1-2 Views  Result Date: 10/13/2021 CLINICAL DATA:  87 year old female status post fall. EXAM: PELVIS - 1-2 VIEW COMPARISON:  Pelvis 09/26/2021 and earlier. FINDINGS: AP view at 0352 hours. Mildly rotated to the right. Femoral heads remain normally located. Grossly intact proximal femurs. No pelvis fracture identified. Left hemipelvis uterine fibroid calcification. Vascular calcifications. IMPRESSION: No acute fracture or dislocation identified about the pelvis. If there is lateralizing hip pain recommend dedicated hip series. Electronically Signed   By: Genevie Ann M.D.   On: 10/13/2021 03:59   CT Head Wo Contrast  Result Date: 10/13/2021 CLINICAL DATA:  Fall EXAM: CT HEAD WITHOUT CONTRAST CT CERVICAL SPINE WITHOUT CONTRAST TECHNIQUE: Multidetector CT imaging of the head and cervical spine was performed following the standard protocol without intravenous contrast. Multiplanar CT image reconstructions of the cervical spine were also generated. COMPARISON:  None. FINDINGS: CT HEAD FINDINGS Brain: There is no mass, hemorrhage or extra-axial collection. There is generalized atrophy without lobar predilection.  There is hypoattenuation of the periventricular white matter, most commonly indicating chronic ischemic microangiopathy. There are multiple old basal ganglia small vessel infarcts. Vascular: No abnormal hyperdensity of the major intracranial arteries or dural venous sinuses. No intracranial atherosclerosis. Skull: The visualized skull base, calvarium and extracranial soft tissues are normal. Sinuses/Orbits: No fluid levels or advanced mucosal thickening of the visualized paranasal sinuses. No mastoid or middle ear effusion. The orbits are normal. CT CERVICAL SPINE FINDINGS Alignment: No static  subluxation. Facets are aligned. Occipital condyles are normally positioned. Skull base and vertebrae: No acute fracture. Soft tissues and spinal canal: No prevertebral fluid or swelling. No visible canal hematoma. Disc levels: No advanced spinal canal or neural foraminal stenosis. Upper chest: No pneumothorax, pulmonary nodule or pleural effusion. Other: Normal visualized paraspinal cervical soft tissues. IMPRESSION: 1. Chronic ischemic microangiopathy and generalized atrophy without acute intracranial abnormality. 2. No acute fracture or static subluxation of the cervical spine. Electronically Signed   By: Ulyses Jarred M.D.   On: 10/13/2021 04:00   CT Cervical Spine Wo Contrast  Result Date: 10/13/2021 CLINICAL DATA:  Fall EXAM: CT HEAD WITHOUT CONTRAST CT CERVICAL SPINE WITHOUT CONTRAST TECHNIQUE: Multidetector CT imaging of the head and cervical spine was performed following the standard protocol without intravenous contrast. Multiplanar CT image reconstructions of the cervical spine were also generated. COMPARISON:  None. FINDINGS: CT HEAD FINDINGS Brain: There is no mass, hemorrhage or extra-axial collection. There is generalized atrophy without lobar predilection. There is hypoattenuation of the periventricular white matter, most commonly indicating chronic ischemic microangiopathy. There are multiple old basal ganglia small vessel infarcts. Vascular: No abnormal hyperdensity of the major intracranial arteries or dural venous sinuses. No intracranial atherosclerosis. Skull: The visualized skull base, calvarium and extracranial soft tissues are normal. Sinuses/Orbits: No fluid levels or advanced mucosal thickening of the visualized paranasal sinuses. No mastoid or middle ear effusion. The orbits are normal. CT CERVICAL SPINE FINDINGS Alignment: No static subluxation. Facets are aligned. Occipital condyles are normally positioned. Skull base and vertebrae: No acute fracture. Soft tissues and spinal canal:  No prevertebral fluid or swelling. No visible canal hematoma. Disc levels: No advanced spinal canal or neural foraminal stenosis. Upper chest: No pneumothorax, pulmonary nodule or pleural effusion. Other: Normal visualized paraspinal cervical soft tissues. IMPRESSION: 1. Chronic ischemic microangiopathy and generalized atrophy without acute intracranial abnormality. 2. No acute fracture or static subluxation of the cervical spine. Electronically Signed   By: Ulyses Jarred M.D.   On: 10/13/2021 04:00    EKG: Independently reviewed.  Vent. rate 67 BPM PR interval 266 ms QRS duration 76 ms QT/QTcB 376/397 ms P-R-T axes 76 6 31 Sinus rhythm with 1st degree A-V block with  Premature supraventricular complexes and  Premature ventricular complexes or Fusion complexes Otherwise normal ECG  Assessment/Plan Principal Problem:   AKI (acute kidney injury) ([place]) Superimposed on   CKD stage 4 due to type 2 diabetes mellitus ([place]) Observation/telemetry. Continue gentle IV hydration. Monitor intake and output. Avoid hypotension. Avoid nephrotoxins. Follow-up renal function electrolytes in AM.  Active Problems:   Hyperkalemia Resolved. Continue IV fluids. Follow-up potassium level.    Diabetes mellitus type 2, diet-controlled ([facility]) Carbohydrate modified diet.    Hyperlipidemia Continue atorvastatin 20 mg p.o. daily.    HTN (hypertension) Currently not on antihypertensives. Monitor blood pressure.    Dementia with behavioral disturbance Supportive care. Reorient as needed.    DVT prophylaxis: Lovenox SQ. Code Status:   Full code. Family Communication:   Disposition Plan:  Patient is from:  SNF.  Anticipated DC to:  SNF.  Anticipated DC date:  10/14/2021 or 10/15/2021.  Anticipated DC barriers: Clinical status.  Consults called:   Admission status:  Observation/telemetry.    Severity of Illness:High severity after presenting to the emergency department with a history of a fall  and recurrence of acute kidney injury on stage IV CKD.  The patient will remain in observation at the hospital for IV hydration and close monitoring of her renal function.  Reubin Milan MD [facility]  How to contact the [facility] Attending or Consulting provider [place] or covering provider during after hours [place], for this patient?   Check the care team in [facility] [address] and look for a) attending/consulting [facility] provider listed and b) the [facility] team listed Log into [email or web address] and use [place]'s universal password to access. If you do not have the password, please contact the hospital operator. Locate the [facility] provider you are looking for under [facility] and page to a number that you can be directly reached. If you still have difficulty reaching the provider, please page the [facility] (Director on Call) for the Hospitalists listed on amion for assistance.  10/13/2021, 10:34 AM   This document was prepared using Dragon voice recognition software and may contain unintended transcription errors.

## 2021-10-13 NOTE — ED Notes (Addendum)
Pt cleaned, with new purewick and diaper placed. Mitts applied to keep pt from pulling at lines

## 2021-10-13 NOTE — ED Notes (Signed)
Pt cussing at staff

## 2021-10-13 NOTE — Discharge Instructions (Signed)
CT scan of head and neck are negative.  Sutures should be removed from forehead in 5-7 days.  Keep wound clean and dry.  Follow-up with your doctor.  Return to the ED with new or worsening symptoms.

## 2021-10-13 NOTE — ED Triage Notes (Signed)
Pt BIB [facility] from [place] after a witnessed fall. Pt has a small lac to her forehead, no LOC. Per SNF pt is acting normally. Pt refused [facility]-[place] with EMS.  VS with EMS: 150/90 62 HR  94% on RA CBG 135
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
## 2021-10-14 DIAGNOSIS — N184 Chronic kidney disease, stage 4 (severe): Secondary | ICD-10-CM

## 2021-10-14 DIAGNOSIS — S0181XA Laceration without foreign body of other part of head, initial encounter: Secondary | ICD-10-CM

## 2021-10-14 DIAGNOSIS — F03918 Unspecified dementia, unspecified severity, with other behavioral disturbance: Secondary | ICD-10-CM | POA: Diagnosis not present

## 2021-10-14 DIAGNOSIS — S0101XA Laceration without foreign body of scalp, initial encounter: Secondary | ICD-10-CM | POA: Diagnosis not present

## 2021-10-14 DIAGNOSIS — E119 Type 2 diabetes mellitus without complications: Secondary | ICD-10-CM

## 2021-10-14 DIAGNOSIS — E1122 Type 2 diabetes mellitus with diabetic chronic kidney disease: Secondary | ICD-10-CM | POA: Diagnosis not present

## 2021-10-14 DIAGNOSIS — E785 Hyperlipidemia, unspecified: Secondary | ICD-10-CM

## 2021-10-14 DIAGNOSIS — W19XXXA Unspecified fall, initial encounter: Secondary | ICD-10-CM

## 2021-10-14 DIAGNOSIS — I10 Essential (primary) hypertension: Secondary | ICD-10-CM

## 2021-10-14 DIAGNOSIS — Y92129 Unspecified place in nursing home as the place of occurrence of the external cause: Secondary | ICD-10-CM

## 2021-10-14 DIAGNOSIS — N179 Acute kidney failure, unspecified: Secondary | ICD-10-CM | POA: Diagnosis not present

## 2021-10-14 LAB — CBC
HCT: 33.3 % — ABNORMAL LOW (ref 36.0–46.0)
Hemoglobin: 9.9 g/dL — ABNORMAL LOW (ref 12.0–15.0)
MCH: 29.7 pg (ref 26.0–34.0)
MCHC: 29.7 g/dL — ABNORMAL LOW (ref 30.0–36.0)
MCV: 100 fL (ref 80.0–100.0)
Platelets: 202 10*3/uL (ref 150–400)
RBC: 3.33 MIL/uL — ABNORMAL LOW (ref 3.87–5.11)
RDW: 15.2 % (ref 11.5–15.5)
WBC: 8.3 10*3/uL (ref 4.0–10.5)
nRBC: 0 % (ref 0.0–0.2)

## 2021-10-14 LAB — BASIC METABOLIC PANEL
Anion gap: 6 (ref 5–15)
BUN: 44 mg/dL — ABNORMAL HIGH (ref 8–23)
CO2: 18 mmol/L — ABNORMAL LOW (ref 22–32)
Calcium: 9 mg/dL (ref 8.9–10.3)
Chloride: 115 mmol/L — ABNORMAL HIGH (ref 98–111)
Creatinine, Ser: 1.89 mg/dL — ABNORMAL HIGH (ref 0.44–1.00)
GFR, Estimated: 25 mL/min — ABNORMAL LOW (ref >60)
Glucose, Bld: 85 mg/dL (ref 70–99)
Potassium: 4.7 mmol/L (ref 3.5–5.1)
Sodium: 139 mmol/L (ref 135–145)

## 2021-10-14 LAB — C DIFFICILE QUICK SCREEN W PCR REFLEX
C Diff antigen: POSITIVE — AB
C Diff toxin: NEGATIVE

## 2021-10-14 MED ORDER — LOPERAMIDE HCL 2 MG PO CAPS: 2.0000 mg | ORAL_CAPSULE | ORAL | 0 refills | Status: AC | PRN

## 2021-10-14 NOTE — Progress Notes (Addendum)
CSW attempted to contact [name] to informed facility pt will return not response  left HIPPA compliant VM.   3:15pm CSW faxed pt's AVS to [facility] [phone].    Arlie Solomons.Fin Hupp, MSW, [place]  Transitions of Care Clinical Social Worker I Direct Dial: [phone]  Fax: [phone] [email or web address]

## 2021-10-14 NOTE — ED Notes (Signed)
PTAR at bedside taking pt back to facility

## 2021-10-14 NOTE — Discharge Summary (Signed)
Physician Discharge Summary  [name] [name] NTI:[id number] DOB: 1930-01-11 DOA: 10/13/2021  PCP: Beckie Salts, MD  Admit date: 10/13/2021 Discharge date: 10/14/2021  Admitted From: Skilled nursing facility  Discharge disposition: Skilled nursing facility  Recommendations for Outpatient Follow-Up:   Follow up with your primary care provider  at the skilled nursing facility in 3 to 5 days Check CBC, BMP, magnesium in the next visit Consider suture removal in the forehead in 5 to 7 days.  Discharge Diagnosis:   Principal Problem:   AKI (acute kidney injury) ([place]) Active Problems:   Diabetes mellitus type 2, diet-controlled ([place])   Hyperlipidemia   HTN (hypertension)   CKD stage 4 due to type 2 diabetes mellitus ([place])   Dementia with behavioral disturbance   Hyperkalemia   Discharge Condition: Improved.  Diet recommendation: Low sodium, heart healthy.  Carbohydrate-modified.    Wound care: None.  Code status: Full.   History of Present Illness:   [name] [name] is a 87 y.o. female with medical history significant of type 2 diabetes, hyperlipidemia, hypertension, stage IV CKD, class I obesity with a BMI of 32.98 kg/m, history of COVID-19 last month, diastolic dysfunction, depression, history of suicidal ideations, history of prolonged QT interval, syncope who was discharged 5 days prior to this presentation ago after  admission due to AKI, hyperkalemia, altered mental status and frequent falls presented to hospital after having a witnessed fall from her bed with a small laceration to the frontal area.  In the ED vitals were stable.  Patient received a normal saline bolus Lokelma, calcium gluconate for hyperkalemia and wound suture was done and patient was placed in observation.  Hospital Course:   Following conditions were addressed during hospitalization as listed below,  AKI on CKD stage 4  Patient received IV fluid bolus followed by IV fluid hydration.  Creatinine prior to  discharge was 1.8.  Creatinine on presentation was 2.3..  Creatinine at baseline at this time.  Frontal area laceration 2 cm received nylon suture in the ED.  Consider removal of suture in 5 to 7 days.    Hyperkalemia with peaked T waves on presentation. Resolved.  Initially received temporizing measures.  Potassium prior to discharge was 4.7.    Diabetes mellitus type 2, diet-controlled Continue diabetic diet on discharge.     Hyperlipidemia On Lipitor to be continued   History of essential hypertension. Not on antihypertensives at this time.  Could address as outpatient.  Blood pressures were stable.     Dementia with behavioral disturbance At baseline patient has behavioral disturbances at the skilled nursing facility.  Spoke with the patient's daughter about it.  Currently at baseline  Diffuse pulmonary nodules.  Unchanged from previous x-ray.  Symptoms at baseline.   Disposition.  At this time, patient is stable for disposition to skilled nursing facility.  Spoke with the patient's daughter prior to disposition.  Medical Consultants:   None.  Procedures:    None Subjective:   Today, patient was seen and examined at bedside.  Singing Jesus sounds.  Denies pain, nausea, vomiting.  Poor historian.  Has underlying dementia with behavioral disturbance.  Discharge Exam:   Vitals:   10/14/21 1230 10/14/21 1300  BP: (!) 153/92 (!) 160/87  Pulse: 95 95  Resp: 17 (!) 23  Temp:    SpO2: 98% 100%   Vitals:   10/14/21 1130 10/14/21 1200 10/14/21 1230 10/14/21 1300  BP: 125/66 125/78 (!) 153/92 (!) 160/87  Pulse: 80 98 95 95  Resp: Marland Kitchen)  21 18 17  (!) 23  Temp:      SpO2: 99% 99% 98% 100%  Weight:      Height:       General: Alert awake, not in obvious distress, has underlying dementia, HENT: pupils equally reacting to light,  No scleral pallor or icterus noted. Oral mucosa is dry.  Frontal area laceration status post suture. Chest:  Clear breath sounds.  Diminished  breath sounds bilaterally. No crackles or wheezes.  CVS: S1 &S2 heard. No murmur.  Regular rate and rhythm. Abdomen: Soft, nontender, nondistended.  Bowel sounds are heard.   Extremities: No cyanosis, clubbing or edema.  Peripheral pulses are palpable. Psych: Alert, awake and oriented to self.  Confused with underlying dementia. CNS:  No cranial nerve deficits.  All extremities. Skin: Warm and dry.  No rashes noted.  The results of significant diagnostics from this hospitalization (including imaging, microbiology, ancillary and laboratory) are listed below for reference.     Diagnostic Studies:   DG Chest 2 View  Result Date: 10/13/2021 CLINICAL DATA:  87 year old female status post fall. EXAM: CHEST - 2 VIEW COMPARISON:  Chest CT 10/04/2021 and earlier. FINDINGS: Semi upright AP and lateral views of the chest at 0350 hours. Chronically fractured left humerus with regional retained metallic foreign body redemonstrated. Lung volumes and mediastinal contours remain stable. Visualized tracheal air column is within normal limits. Stable ventilation. Allowing for portable technique the lungs are clear, although subcentimeter lower lung predominant pulmonary nodules were recently demonstrated by CT. No pneumothorax or pleural effusion. No acute osseous abnormality identified. Negative visible bowel gas. Stable cholecystectomy clips. IMPRESSION: 1. Stable ventilation from the CT last month which demonstrated lower lung predominant small pulmonary nodules suspicious for atypical infection (such as MAI). 2. No acute traumatic injury identified. Chronic left humerus fracture with retained metallic foreign bodies. Electronically Signed   By: Genevie Ann M.D.   On: 10/13/2021 04:03   DG Pelvis 1-2 Views  Result Date: 10/13/2021 CLINICAL DATA:  87 year old female status post fall. EXAM: PELVIS - 1-2 VIEW COMPARISON:  Pelvis 09/26/2021 and earlier. FINDINGS: AP view at 0352 hours. Mildly rotated to the right.  Femoral heads remain normally located. Grossly intact proximal femurs. No pelvis fracture identified. Left hemipelvis uterine fibroid calcification. Vascular calcifications. IMPRESSION: No acute fracture or dislocation identified about the pelvis. If there is lateralizing hip pain recommend dedicated hip series. Electronically Signed   By: Genevie Ann M.D.   On: 10/13/2021 03:59   CT Head Wo Contrast  Result Date: 10/13/2021 CLINICAL DATA:  Fall EXAM: CT HEAD WITHOUT CONTRAST CT CERVICAL SPINE WITHOUT CONTRAST TECHNIQUE: Multidetector CT imaging of the head and cervical spine was performed following the standard protocol without intravenous contrast. Multiplanar CT image reconstructions of the cervical spine were also generated. COMPARISON:  None. FINDINGS: CT HEAD FINDINGS Brain: There is no mass, hemorrhage or extra-axial collection. There is generalized atrophy without lobar predilection. There is hypoattenuation of the periventricular white matter, most commonly indicating chronic ischemic microangiopathy. There are multiple old basal ganglia small vessel infarcts. Vascular: No abnormal hyperdensity of the major intracranial arteries or dural venous sinuses. No intracranial atherosclerosis. Skull: The visualized skull base, calvarium and extracranial soft tissues are normal. Sinuses/Orbits: No fluid levels or advanced mucosal thickening of the visualized paranasal sinuses. No mastoid or middle ear effusion. The orbits are normal. CT CERVICAL SPINE FINDINGS Alignment: No static subluxation. Facets are aligned. Occipital condyles are normally positioned. Skull base and vertebrae: No acute fracture. Soft  tissues and spinal canal: No prevertebral fluid or swelling. No visible canal hematoma. Disc levels: No advanced spinal canal or neural foraminal stenosis. Upper chest: No pneumothorax, pulmonary nodule or pleural effusion. Other: Normal visualized paraspinal cervical soft tissues. IMPRESSION: 1. Chronic ischemic  microangiopathy and generalized atrophy without acute intracranial abnormality. 2. No acute fracture or static subluxation of the cervical spine. Electronically Signed   By: Ulyses Jarred M.D.   On: 10/13/2021 04:00   CT Cervical Spine Wo Contrast  Result Date: 10/13/2021 CLINICAL DATA:  Fall EXAM: CT HEAD WITHOUT CONTRAST CT CERVICAL SPINE WITHOUT CONTRAST TECHNIQUE: Multidetector CT imaging of the head and cervical spine was performed following the standard protocol without intravenous contrast. Multiplanar CT image reconstructions of the cervical spine were also generated. COMPARISON:  None. FINDINGS: CT HEAD FINDINGS Brain: There is no mass, hemorrhage or extra-axial collection. There is generalized atrophy without lobar predilection. There is hypoattenuation of the periventricular white matter, most commonly indicating chronic ischemic microangiopathy. There are multiple old basal ganglia small vessel infarcts. Vascular: No abnormal hyperdensity of the major intracranial arteries or dural venous sinuses. No intracranial atherosclerosis. Skull: The visualized skull base, calvarium and extracranial soft tissues are normal. Sinuses/Orbits: No fluid levels or advanced mucosal thickening of the visualized paranasal sinuses. No mastoid or middle ear effusion. The orbits are normal. CT CERVICAL SPINE FINDINGS Alignment: No static subluxation. Facets are aligned. Occipital condyles are normally positioned. Skull base and vertebrae: No acute fracture. Soft tissues and spinal canal: No prevertebral fluid or swelling. No visible canal hematoma. Disc levels: No advanced spinal canal or neural foraminal stenosis. Upper chest: No pneumothorax, pulmonary nodule or pleural effusion. Other: Normal visualized paraspinal cervical soft tissues. IMPRESSION: 1. Chronic ischemic microangiopathy and generalized atrophy without acute intracranial abnormality. 2. No acute fracture or static subluxation of the cervical spine.  Electronically Signed   By: Ulyses Jarred M.D.   On: 10/13/2021 04:00     Labs:   Basic Metabolic Panel: Recent Labs  Lab 10/13/21 0520 10/13/21 1553 10/14/21 0550  NA 137 140 139  K 6.0* 4.7 4.7  CL 112* 114* 115*  CO2 18* 19* 18*  GLUCOSE 97 124* 85  BUN 49* 48* 44*  CREATININE 2.38* 2.11* 1.89*  CALCIUM 9.2 9.2 9.0   GFR Estimated Creatinine Clearance: 22.4 mL/min (A) (by C-G formula based on SCr of 1.89 mg/dL (H)). Liver Function Tests: No results for input(s): AST, ALT, ALKPHOS, BILITOT, PROT, ALBUMIN in the last 168 hours. No results for input(s): LIPASE, AMYLASE in the last 168 hours. No results for input(s): AMMONIA in the last 168 hours. Coagulation profile No results for input(s): INR, PROTIME in the last 168 hours.  CBC: Recent Labs  Lab 10/13/21 0520 10/14/21 0550  WBC 9.0 8.3  NEUTROABS 5.1  --   HGB 10.2* 9.9*  HCT 33.5* 33.3*  MCV 97.7 100.0  PLT 211 202   Cardiac Enzymes: No results for input(s): CKTOTAL, CKMB, CKMBINDEX, TROPONINI in the last 168 hours. BNP: Invalid input(s): POCBNP CBG: Recent Labs  Lab 10/13/21 0242  GLUCAP 91   D-Dimer No results for input(s): DDIMER in the last 72 hours. Hgb A1c No results for input(s): HGBA1C in the last 72 hours. Lipid Profile No results for input(s): CHOL, HDL, LDLCALC, TRIG, CHOLHDL, LDLDIRECT in the last 72 hours. Thyroid function studies No results for input(s): TSH, T4TOTAL, T3FREE, THYROIDAB in the last 72 hours.  Invalid input(s): FREET3 Anemia work up No results for input(s): VITAMINB12, FOLATE, FERRITIN,  TIBC, IRON, RETICCTPCT in the last 72 hours. Microbiology Recent Results (from the past 240 hour(s))  Respiratory (~20 pathogens) panel by PCR     Status: Abnormal   Collection Time: 10/04/21  2:00 PM   Specimen: Nasopharyngeal Swab; Respiratory  Result Value Ref Range Status   Adenovirus NOT DETECTED NOT DETECTED Final   Coronavirus 229E NOT DETECTED NOT DETECTED Final     Comment: (NOTE) The Coronavirus on the Respiratory Panel, DOES NOT test for the novel  Coronavirus (2019 nCoV)    Coronavirus HKU1 NOT DETECTED NOT DETECTED Final   Coronavirus NL63 NOT DETECTED NOT DETECTED Final   Coronavirus OC43 NOT DETECTED NOT DETECTED Final   Metapneumovirus NOT DETECTED NOT DETECTED Final   Rhinovirus / Enterovirus NOT DETECTED NOT DETECTED Final   Influenza A H3 DETECTED (A) NOT DETECTED Final   Influenza B NOT DETECTED NOT DETECTED Final   Parainfluenza Virus 1 NOT DETECTED NOT DETECTED Final   Parainfluenza Virus 2 NOT DETECTED NOT DETECTED Final   Parainfluenza Virus 3 NOT DETECTED NOT DETECTED Final   Parainfluenza Virus 4 NOT DETECTED NOT DETECTED Final   Respiratory Syncytial Virus NOT DETECTED NOT DETECTED Final   Bordetella pertussis NOT DETECTED NOT DETECTED Final   Bordetella Parapertussis NOT DETECTED NOT DETECTED Final   Chlamydophila pneumoniae NOT DETECTED NOT DETECTED Final   Mycoplasma pneumoniae NOT DETECTED NOT DETECTED Final    Comment: Performed at [facility], [place]. [address], [place], [place] [address]  C Difficile Quick Screen w PCR reflex     Status: Abnormal   Collection Time: 10/14/21 12:27 PM   Specimen: STOOL  Result Value Ref Range Status   C Diff antigen POSITIVE (A) NEGATIVE Final   C Diff toxin NEGATIVE NEGATIVE Final   C Diff interpretation Results are indeterminate. See PCR results.  Final    Comment: Performed at [facility], [place] [address] [place], [place] [address]     Discharge Instructions:   Discharge Instructions     Call MD for:  persistant nausea and vomiting   Complete by: As directed    Call MD for:  temperature >100.4   Complete by: As directed    Diet - low sodium heart healthy   Complete by: As directed    Diet Carb Modified   Complete by: As directed    Discharge instructions   Complete by: As directed    Follow-up with primary care provider at the skilled nursing  facility in 3 to 5 days.   Increase activity slowly   Complete by: As directed    No wound care   Complete by: As directed       Allergies as of 10/14/2021       Reactions   Alendronate    Other reaction(s): Other (See Comments) Poor renal function.        Medication List     TAKE these medications    acetaminophen 325 MG tablet Commonly known as: TYLENOL Take 2 tablets (650 mg total) by mouth every 6 (six) hours as needed for mild pain (or Fever >/= 101).   atorvastatin 20 MG tablet Commonly known as: LIPITOR Take 20 mg by mouth daily.   loperamide 2 MG capsule Commonly known as: IMODIUM Take 1 capsule (2 mg total) by mouth as needed for diarrhea or loose stools (upto 3 times a day).   megestrol 400 MG/10ML suspension Commonly known as: MEGACE Take 10 mLs (400 mg total) by mouth  daily.   pantoprazole 40 MG tablet Commonly known as: PROTONIX Take 40 mg by mouth daily.   Vitamin D (Ergocalciferol) 1.25 MG (50000 UNIT) Caps capsule Commonly known as: DRISDOL Take 50,000 Units by mouth once a week. Friday        Follow-up Information     Beckie Salts, MD. Schedule an appointment as soon as possible for a visit in 2 days.   Specialty: Internal Medicine Contact information: [address] [facility] [address] [phone]                  Time coordinating discharge: 39 minutes  Signed:  Tao Satz  [facility] 10/14/2021, 1:53 PM

## 2021-10-14 NOTE — ED Notes (Signed)
Patient noted to be yelling and moaning loudly in the room. Patient states she is singing to [place]. Will continue to monitor.

## 2021-10-14 NOTE — ED Notes (Signed)
Report called to [facility], report given to [employer].  Glenard Haring states understanding and agreement of pt coming back to facility.  Denies questions or concerns at this time.

## 2021-10-14 NOTE — ED Notes (Signed)
[facility] called and ride arranged for transporting pt back to care facility.  No eta available at this time.

## 2021-10-14 NOTE — ED Notes (Signed)
Pt checked at this time, pt not needing to be changed at this time. Pt awaiting [facility] transport back to [facility] care facility.

## 2021-10-14 NOTE — ED Notes (Signed)
Attempted to call report to facility.  Unable to get ahold of a RN to give report too.  ED RN will continue to attempt to give report.

## 2021-10-15 LAB — CLOSTRIDIUM DIFFICILE BY PCR, REFLEXED: Toxigenic C. Difficile by PCR: POSITIVE — AB

## 2022-04-04 ENCOUNTER — Encounter (HOSPITAL_COMMUNITY): Payer: Self-pay | Admitting: Emergency Medicine

## 2022-04-04 ENCOUNTER — Inpatient Hospital Stay (HOSPITAL_COMMUNITY)
Admission: EM | Admit: 2022-04-04 | Discharge: 2022-04-18 | DRG: 871 | Disposition: A | Discharge status: Skilled Nursing Facility | Payer: Medicare Other | Source: Skilled Nursing Facility | Attending: Family Medicine | Admitting: Family Medicine

## 2022-04-04 ENCOUNTER — Emergency Department (HOSPITAL_COMMUNITY): Payer: Medicare Other

## 2022-04-04 ENCOUNTER — Other Ambulatory Visit: Payer: Self-pay

## 2022-04-04 DIAGNOSIS — Z5320 Procedure and treatment not carried out because of patient's decision for unspecified reasons: Secondary | ICD-10-CM | POA: Diagnosis not present

## 2022-04-04 DIAGNOSIS — F0394 Unspecified dementia, unspecified severity, with anxiety: Secondary | ICD-10-CM | POA: Diagnosis present

## 2022-04-04 DIAGNOSIS — R001 Bradycardia, unspecified: Secondary | ICD-10-CM | POA: Diagnosis present

## 2022-04-04 DIAGNOSIS — I130 Hypertensive heart and chronic kidney disease with heart failure and stage 1 through stage 4 chronic kidney disease, or unspecified chronic kidney disease: Secondary | ICD-10-CM | POA: Diagnosis present

## 2022-04-04 DIAGNOSIS — E11649 Type 2 diabetes mellitus with hypoglycemia without coma: Secondary | ICD-10-CM | POA: Diagnosis not present

## 2022-04-04 DIAGNOSIS — B9689 Other specified bacterial agents as the cause of diseases classified elsewhere: Secondary | ICD-10-CM | POA: Diagnosis present

## 2022-04-04 DIAGNOSIS — E875 Hyperkalemia: Secondary | ICD-10-CM | POA: Diagnosis present

## 2022-04-04 DIAGNOSIS — N184 Chronic kidney disease, stage 4 (severe): Secondary | ICD-10-CM | POA: Diagnosis present

## 2022-04-04 DIAGNOSIS — F03911 Unspecified dementia, unspecified severity, with agitation: Secondary | ICD-10-CM | POA: Diagnosis present

## 2022-04-04 DIAGNOSIS — A4189 Other specified sepsis: Secondary | ICD-10-CM | POA: Diagnosis not present

## 2022-04-04 DIAGNOSIS — Z888 Allergy status to other drugs, medicaments and biological substances status: Secondary | ICD-10-CM

## 2022-04-04 DIAGNOSIS — A498 Other bacterial infections of unspecified site: Secondary | ICD-10-CM

## 2022-04-04 DIAGNOSIS — R0682 Tachypnea, not elsewhere classified: Secondary | ICD-10-CM | POA: Diagnosis present

## 2022-04-04 DIAGNOSIS — A419 Sepsis, unspecified organism: Secondary | ICD-10-CM | POA: Diagnosis not present

## 2022-04-04 DIAGNOSIS — Z8673 Personal history of transient ischemic attack (TIA), and cerebral infarction without residual deficits: Secondary | ICD-10-CM

## 2022-04-04 DIAGNOSIS — D649 Anemia, unspecified: Secondary | ICD-10-CM | POA: Diagnosis present

## 2022-04-04 DIAGNOSIS — N179 Acute kidney failure, unspecified: Secondary | ICD-10-CM | POA: Diagnosis present

## 2022-04-04 DIAGNOSIS — I440 Atrioventricular block, first degree: Secondary | ICD-10-CM | POA: Diagnosis present

## 2022-04-04 DIAGNOSIS — I2510 Atherosclerotic heart disease of native coronary artery without angina pectoris: Secondary | ICD-10-CM | POA: Diagnosis present

## 2022-04-04 DIAGNOSIS — G9341 Metabolic encephalopathy: Secondary | ICD-10-CM | POA: Diagnosis present

## 2022-04-04 DIAGNOSIS — T68XXXA Hypothermia, initial encounter: Secondary | ICD-10-CM | POA: Diagnosis present

## 2022-04-04 DIAGNOSIS — F03918 Unspecified dementia, unspecified severity, with other behavioral disturbance: Secondary | ICD-10-CM | POA: Diagnosis present

## 2022-04-04 DIAGNOSIS — D539 Nutritional anemia, unspecified: Secondary | ICD-10-CM

## 2022-04-04 DIAGNOSIS — I5032 Chronic diastolic (congestive) heart failure: Secondary | ICD-10-CM | POA: Diagnosis present

## 2022-04-04 DIAGNOSIS — E162 Hypoglycemia, unspecified: Secondary | ICD-10-CM

## 2022-04-04 DIAGNOSIS — R6521 Severe sepsis with septic shock: Secondary | ICD-10-CM | POA: Diagnosis present

## 2022-04-04 DIAGNOSIS — D631 Anemia in chronic kidney disease: Secondary | ICD-10-CM | POA: Diagnosis present

## 2022-04-04 DIAGNOSIS — E785 Hyperlipidemia, unspecified: Secondary | ICD-10-CM | POA: Diagnosis present

## 2022-04-04 DIAGNOSIS — F0392 Unspecified dementia, unspecified severity, with psychotic disturbance: Secondary | ICD-10-CM | POA: Diagnosis present

## 2022-04-04 DIAGNOSIS — Z66 Do not resuscitate: Secondary | ICD-10-CM | POA: Diagnosis present

## 2022-04-04 DIAGNOSIS — D6959 Other secondary thrombocytopenia: Secondary | ICD-10-CM | POA: Diagnosis present

## 2022-04-04 DIAGNOSIS — E872 Acidosis, unspecified: Secondary | ICD-10-CM | POA: Diagnosis present

## 2022-04-04 DIAGNOSIS — E1122 Type 2 diabetes mellitus with diabetic chronic kidney disease: Secondary | ICD-10-CM | POA: Diagnosis present

## 2022-04-04 DIAGNOSIS — Z79899 Other long term (current) drug therapy: Secondary | ICD-10-CM

## 2022-04-04 DIAGNOSIS — N390 Urinary tract infection, site not specified: Secondary | ICD-10-CM | POA: Diagnosis present

## 2022-04-04 HISTORY — DX: Unspecified dementia, unspecified severity, without behavioral disturbance, psychotic disturbance, mood disturbance, and anxiety: F03.90

## 2022-04-04 LAB — URINALYSIS, ROUTINE W REFLEX MICROSCOPIC
Bilirubin Urine: NEGATIVE
Glucose, UA: NEGATIVE mg/dL
Ketones, ur: NEGATIVE mg/dL
Nitrite: NEGATIVE
Protein, ur: 100 mg/dL — AB
RBC / HPF: >50 RBC/hpf — ABNORMAL HIGH (ref 0–5)
Specific Gravity, Urine: 1.014 (ref 1.005–1.030)
WBC, UA: >50 WBC/hpf — ABNORMAL HIGH (ref 0–5)
pH: 5 (ref 5.0–8.0)

## 2022-04-04 MED ORDER — LACTATED RINGERS IV BOLUS
1000.0000 mL | Freq: Once | INTRAVENOUS | Status: AC
  Administered 2022-04-04: 1000 mL via INTRAVENOUS

## 2022-04-04 NOTE — ED Provider Notes (Signed)
[facility] EMERGENCY DEPARTMENT Provider Note   CSN: [id number] Arrival date & time: 04/04/22  2307     History {Add pertinent medical, surgical, social history, OB history to HPI:1} No chief complaint on file.   [name] [name] is a 88 y.o. female.  The history is provided by the EMS personnel and the nursing home. The history is limited by the condition of the patient (Altered mental status).  She has history of hypertension, diabetes, hyperlipidemia, diastolic heart failure, dementia and was brought in by ambulance from a skilled nursing facility because of weakness and somnolence.  Apparently, she had received several doses of lorazepam at the facility.  Of note, she is DNR although that paperwork was not sent with her.   Home Medications Prior to Admission medications   Medication Sig Start Date End Date Taking? Authorizing Provider  acetaminophen (TYLENOL) 325 MG tablet Take 2 tablets (650 mg total) by mouth every 6 (six) hours as needed for mild pain (or Fever >/= 101). 09/20/21   Nolberto Hanlon, MD  atorvastatin (LIPITOR) 20 MG tablet Take 20 mg by mouth daily. 06/13/21   [provider]  loperamide (IMODIUM) 2 MG capsule Take 1 capsule (2 mg total) by mouth as needed for diarrhea or loose stools (upto 3 times a day). 10/14/21   Pokhrel, Corrie Mckusick, MD  megestrol (MEGACE) 400 MG/10ML suspension Take 10 mLs (400 mg total) by mouth daily. 09/21/21   Nolberto Hanlon, MD  pantoprazole (PROTONIX) 40 MG tablet Take 40 mg by mouth daily.    [provider]  Vitamin D, Ergocalciferol, (DRISDOL) 1.25 MG (50000 UNIT) CAPS capsule Take 50,000 Units by mouth once a week. Friday 06/06/21   [provider]      Allergies    Alendronate    Review of Systems   Review of Systems  Unable to perform ROS: Mental status change   Physical Exam Updated Vital Signs BP (!) 113/53   Resp 17  Physical Exam Vitals and nursing note reviewed.  88 year old female,  somnolent but arousable, and in no acute distress. Vital signs are ***. Oxygen saturation is ***%, which is normal. Head is normocephalic and atraumatic. PERRLA. Oropharynx is clear. Neck is nontender and supple without adenopathy or JVD. Back is nontender and there is no CVA tenderness. Lungs are clear without rales, wheezes, or rhonchi. Chest is nontender. Heart is bradycardic without murmur. Abdomen is soft, flat, nontender. Extremities have no cyanosis or edema. Skin is warm and dry without rash. Neurologic: Somnolent but does respond to voice and pain, but will only moan and does not answer questions and does not say anything intelligible.  Does seem to move all extremities equally.  ED Results / Procedures / Treatments   Labs (all labs ordered are listed, but only abnormal results are displayed) Labs Reviewed  URINALYSIS, ROUTINE W REFLEX MICROSCOPIC  COMPREHENSIVE METABOLIC PANEL  CBC WITH DIFFERENTIAL/PLATELET  VALPROIC ACID LEVEL  LACTIC ACID, PLASMA  MAGNESIUM  TROPONIN I (HIGH SENSITIVITY)    EKG EKG Interpretation  Date/Time:  Friday Apr 04 2022 23:07:45 EDT Ventricular Rate:  34 PR Interval:  358 QRS Duration: 134 QT Interval:  537 QTC Calculation: 404 R Axis:   2 Text Interpretation: Sinus or ectopic atrial bradycardia Prolonged PR interval When compared with ECG of 10/13/2021, HEART RATE has decreased Premature atrial complexes are no longer present Confirmed by Delora Fuel ([id number]) on 04/04/2022 11:10:51 PM  Radiology No results found.  Procedures Procedures  Cardiac  monitor shows sinus bradycardia, per my interpretation.  Medications Ordered in ED Medications - No data to display  ED Course/ Medical Decision Making/ A&P                           Medical Decision Making Amount and/or Complexity of Data Reviewed Labs: ordered. Radiology: ordered.   Somnolence, probably excessive sedation.  Severe bradycardia, most likely from sick sinus syndrome.   Review of her medications shows that she is not on anything that should slow the heart rate, and she does carry a diagnosis of bradycardia although current bradycardia is more extreme than what has been documented in the past.  Bradycardia may also be contributing to decreased mentation because of decreased cardiac output from bradycardia-even if she is not hypotensive.  Consider occult infection.  We will check chest x-ray and urinalysis.  Also, we will check troponin to rule out occult myocardial infarction.  ECG is obtained and is independently viewed and interpreted by me and shows severe sinus bradycardia without any ST or T changes.  Old records are reviewed, and she has several ED visits for falls and admission for syncope, but I cannot find any documented heart rates lower than 50.  Patient is found to be markedly hypothermic, this certainly can account for many, if not all, of her symptoms.  However, occult infection may still be behind her hypothermia.  She started on external warming.  {Document critical care time when appropriate:1} {Document review of labs and clinical decision tools ie heart score, Chads2Vasc2 etc:1}  {Document your independent review of radiology images, and any outside records:1} {Document your discussion with family members, caretakers, and with consultants:1} {Document social determinants of health affecting pt's care:1} {Document your decision making why or why not admission, treatments were needed:1} Final Clinical Impression(s) / ED Diagnoses Final diagnoses:  None    Rx / DC Orders ED Discharge Orders     None

## 2022-04-04 NOTE — ED Triage Notes (Signed)
Pt arrived from [facility] for ams.[facility] arrived and noted pt to be bradycardic 30-40s; called for EMS assistance; hx of dementia

## 2022-04-05 ENCOUNTER — Inpatient Hospital Stay (HOSPITAL_COMMUNITY): Payer: Medicare Other

## 2022-04-05 DIAGNOSIS — R6521 Severe sepsis with septic shock: Secondary | ICD-10-CM

## 2022-04-05 DIAGNOSIS — Z515 Encounter for palliative care: Secondary | ICD-10-CM

## 2022-04-05 DIAGNOSIS — N179 Acute kidney failure, unspecified: Secondary | ICD-10-CM | POA: Diagnosis present

## 2022-04-05 DIAGNOSIS — E785 Hyperlipidemia, unspecified: Secondary | ICD-10-CM | POA: Diagnosis present

## 2022-04-05 DIAGNOSIS — I440 Atrioventricular block, first degree: Secondary | ICD-10-CM | POA: Diagnosis present

## 2022-04-05 DIAGNOSIS — E11649 Type 2 diabetes mellitus with hypoglycemia without coma: Secondary | ICD-10-CM | POA: Diagnosis not present

## 2022-04-05 DIAGNOSIS — D631 Anemia in chronic kidney disease: Secondary | ICD-10-CM | POA: Diagnosis present

## 2022-04-05 DIAGNOSIS — I130 Hypertensive heart and chronic kidney disease with heart failure and stage 1 through stage 4 chronic kidney disease, or unspecified chronic kidney disease: Secondary | ICD-10-CM | POA: Diagnosis present

## 2022-04-05 DIAGNOSIS — G9341 Metabolic encephalopathy: Secondary | ICD-10-CM | POA: Diagnosis present

## 2022-04-05 DIAGNOSIS — E872 Acidosis, unspecified: Secondary | ICD-10-CM | POA: Diagnosis present

## 2022-04-05 DIAGNOSIS — Z5320 Procedure and treatment not carried out because of patient's decision for unspecified reasons: Secondary | ICD-10-CM | POA: Diagnosis not present

## 2022-04-05 DIAGNOSIS — N184 Chronic kidney disease, stage 4 (severe): Secondary | ICD-10-CM | POA: Diagnosis present

## 2022-04-05 DIAGNOSIS — Z8673 Personal history of transient ischemic attack (TIA), and cerebral infarction without residual deficits: Secondary | ICD-10-CM | POA: Diagnosis not present

## 2022-04-05 DIAGNOSIS — A419 Sepsis, unspecified organism: Secondary | ICD-10-CM | POA: Diagnosis present

## 2022-04-05 DIAGNOSIS — T68XXXA Hypothermia, initial encounter: Secondary | ICD-10-CM | POA: Insufficient documentation

## 2022-04-05 DIAGNOSIS — A4189 Other specified sepsis: Secondary | ICD-10-CM | POA: Diagnosis present

## 2022-04-05 DIAGNOSIS — Z66 Do not resuscitate: Secondary | ICD-10-CM | POA: Diagnosis present

## 2022-04-05 DIAGNOSIS — Z7189 Other specified counseling: Secondary | ICD-10-CM | POA: Diagnosis not present

## 2022-04-05 DIAGNOSIS — F03918 Unspecified dementia, unspecified severity, with other behavioral disturbance: Secondary | ICD-10-CM | POA: Diagnosis present

## 2022-04-05 DIAGNOSIS — B9689 Other specified bacterial agents as the cause of diseases classified elsewhere: Secondary | ICD-10-CM | POA: Diagnosis present

## 2022-04-05 DIAGNOSIS — E1122 Type 2 diabetes mellitus with diabetic chronic kidney disease: Secondary | ICD-10-CM | POA: Diagnosis present

## 2022-04-05 DIAGNOSIS — F0392 Unspecified dementia, unspecified severity, with psychotic disturbance: Secondary | ICD-10-CM | POA: Diagnosis present

## 2022-04-05 DIAGNOSIS — I5032 Chronic diastolic (congestive) heart failure: Secondary | ICD-10-CM | POA: Diagnosis present

## 2022-04-05 DIAGNOSIS — R001 Bradycardia, unspecified: Secondary | ICD-10-CM

## 2022-04-05 DIAGNOSIS — N390 Urinary tract infection, site not specified: Secondary | ICD-10-CM | POA: Diagnosis present

## 2022-04-05 DIAGNOSIS — E875 Hyperkalemia: Secondary | ICD-10-CM | POA: Diagnosis not present

## 2022-04-05 DIAGNOSIS — D6959 Other secondary thrombocytopenia: Secondary | ICD-10-CM | POA: Diagnosis present

## 2022-04-05 DIAGNOSIS — F03911 Unspecified dementia, unspecified severity, with agitation: Secondary | ICD-10-CM | POA: Diagnosis present

## 2022-04-05 DIAGNOSIS — I2510 Atherosclerotic heart disease of native coronary artery without angina pectoris: Secondary | ICD-10-CM | POA: Diagnosis present

## 2022-04-05 LAB — BASIC METABOLIC PANEL
Anion gap: 10 (ref 5–15)
BUN: 74 mg/dL — ABNORMAL HIGH (ref 8–23)
CO2: 21 mmol/L — ABNORMAL LOW (ref 22–32)
Calcium: 8.9 mg/dL (ref 8.9–10.3)
Chloride: 104 mmol/L (ref 98–111)
Creatinine, Ser: 2.4 mg/dL — ABNORMAL HIGH (ref 0.44–1.00)
GFR, Estimated: 19 mL/min — ABNORMAL LOW (ref >60)
Glucose, Bld: 81 mg/dL (ref 70–99)
Potassium: 5.2 mmol/L — ABNORMAL HIGH (ref 3.5–5.1)
Sodium: 135 mmol/L (ref 135–145)

## 2022-04-05 LAB — CBC WITH DIFFERENTIAL/PLATELET
Abs Immature Granulocytes: 0.03 10*3/uL (ref 0.00–0.07)
Basophils Absolute: 0 10*3/uL (ref 0.0–0.1)
Basophils Relative: 0 %
Eosinophils Absolute: 0.2 10*3/uL (ref 0.0–0.5)
Eosinophils Relative: 3 %
HCT: 38.7 % (ref 36.0–46.0)
Hemoglobin: 11.8 g/dL — ABNORMAL LOW (ref 12.0–15.0)
Immature Granulocytes: 1 %
Lymphocytes Relative: 47 %
Lymphs Abs: 2.5 10*3/uL (ref 0.7–4.0)
MCH: 30.7 pg (ref 26.0–34.0)
MCHC: 30.5 g/dL (ref 30.0–36.0)
MCV: 100.8 fL — ABNORMAL HIGH (ref 80.0–100.0)
Monocytes Absolute: 0.7 10*3/uL (ref 0.1–1.0)
Monocytes Relative: 13 %
Neutro Abs: 1.9 10*3/uL (ref 1.7–7.7)
Neutrophils Relative %: 36 %
Platelets: 81 10*3/uL — ABNORMAL LOW (ref 150–400)
RBC: 3.84 MIL/uL — ABNORMAL LOW (ref 3.87–5.11)
RDW: 13.4 % (ref 11.5–15.5)
Smear Review: NORMAL
WBC: 5.3 10*3/uL (ref 4.0–10.5)
nRBC: 0 % (ref 0.0–0.2)

## 2022-04-05 LAB — CBC
HCT: 31 % — ABNORMAL LOW (ref 36.0–46.0)
Hemoglobin: 9.6 g/dL — ABNORMAL LOW (ref 12.0–15.0)
MCH: 30.7 pg (ref 26.0–34.0)
MCHC: 31 g/dL (ref 30.0–36.0)
MCV: 99 fL (ref 80.0–100.0)
Platelets: 89 10*3/uL — ABNORMAL LOW (ref 150–400)
RBC: 3.13 MIL/uL — ABNORMAL LOW (ref 3.87–5.11)
RDW: 13.4 % (ref 11.5–15.5)
WBC: 5.1 10*3/uL (ref 4.0–10.5)
nRBC: 0 % (ref 0.0–0.2)

## 2022-04-05 LAB — COMPREHENSIVE METABOLIC PANEL
ALT: 28 U/L (ref 0–44)
AST: 23 U/L (ref 15–41)
Albumin: 3.4 g/dL — ABNORMAL LOW (ref 3.5–5.0)
Alkaline Phosphatase: 65 U/L (ref 38–126)
Anion gap: 10 (ref 5–15)
BUN: 84 mg/dL — ABNORMAL HIGH (ref 8–23)
CO2: 21 mmol/L — ABNORMAL LOW (ref 22–32)
Calcium: 9.8 mg/dL (ref 8.9–10.3)
Chloride: 109 mmol/L (ref 98–111)
Creatinine, Ser: 2.58 mg/dL — ABNORMAL HIGH (ref 0.44–1.00)
GFR, Estimated: 17 mL/min — ABNORMAL LOW (ref >60)
Glucose, Bld: 109 mg/dL — ABNORMAL HIGH (ref 70–99)
Potassium: 6.3 mmol/L (ref 3.5–5.1)
Sodium: 140 mmol/L (ref 135–145)
Total Bilirubin: 0.4 mg/dL (ref 0.3–1.2)
Total Protein: 7.1 g/dL (ref 6.5–8.1)

## 2022-04-05 LAB — ECHOCARDIOGRAM COMPLETE
AR max vel: 2.02 cm2
AV Area VTI: 1.98 cm2
AV Area mean vel: 1.89 cm2
AV Mean grad: 8 mmHg
AV Peak grad: 14.1 mmHg
Ao pk vel: 1.88 m/s
Height: 68 in
MV VTI: 1.97 cm2
P 1/2 time: 361 msec
S' Lateral: 1.59 cm
Weight: 3315.72 oz

## 2022-04-05 LAB — TROPONIN I (HIGH SENSITIVITY)
Troponin I (High Sensitivity): 14 ng/L (ref <18)
Troponin I (High Sensitivity): 15 ng/L (ref <18)

## 2022-04-05 LAB — HEMOGLOBIN A1C
Hgb A1c MFr Bld: 5.7 % — ABNORMAL HIGH (ref 4.8–5.6)
Mean Plasma Glucose: 116.89 mg/dL

## 2022-04-05 LAB — CBG MONITORING, ED
Glucose-Capillary: 206 mg/dL — ABNORMAL HIGH (ref 70–99)
Glucose-Capillary: 75 mg/dL (ref 70–99)

## 2022-04-05 LAB — PROCALCITONIN: Procalcitonin: 0.3 ng/mL

## 2022-04-05 LAB — APTT: aPTT: 23 seconds — ABNORMAL LOW (ref 24–36)

## 2022-04-05 LAB — GLUCOSE, CAPILLARY
Glucose-Capillary: 91 mg/dL (ref 70–99)
Glucose-Capillary: 87 mg/dL (ref 70–99)
Glucose-Capillary: 85 mg/dL (ref 70–99)
Glucose-Capillary: 102 mg/dL — ABNORMAL HIGH (ref 70–99)
Glucose-Capillary: 175 mg/dL — ABNORMAL HIGH (ref 70–99)

## 2022-04-05 LAB — PROTIME-INR
INR: 1.1 (ref 0.8–1.2)
Prothrombin Time: 14 seconds (ref 11.4–15.2)

## 2022-04-05 LAB — LACTIC ACID, PLASMA
Lactic Acid, Venous: 2.2 mmol/L (ref 0.5–1.9)
Lactic Acid, Venous: 3.7 mmol/L (ref 0.5–1.9)
Lactic Acid, Venous: 2 mmol/L (ref 0.5–1.9)

## 2022-04-05 LAB — MAGNESIUM: Magnesium: 2.1 mg/dL (ref 1.7–2.4)

## 2022-04-05 LAB — VALPROIC ACID LEVEL: Valproic Acid Lvl: 67 ug/mL (ref 50.0–100.0)

## 2022-04-05 LAB — MRSA NEXT GEN BY PCR, NASAL: MRSA by PCR Next Gen: NOT DETECTED

## 2022-04-05 MED ORDER — LACTATED RINGERS IV BOLUS
1000.0000 mL | Freq: Once | INTRAVENOUS | Status: AC
  Administered 2022-04-05: 1000 mL via INTRAVENOUS

## 2022-04-05 MED ORDER — INSULIN ASPART 100 UNIT/ML IV SOLN
5.0000 [IU] | Freq: Once | INTRAVENOUS | Status: AC
  Administered 2022-04-05: 5 [IU] via INTRAVENOUS

## 2022-04-05 MED ORDER — LACTATED RINGERS IV SOLN: INTRAVENOUS | Status: AC

## 2022-04-05 MED ORDER — PERFLUTREN LIPID MICROSPHERE
1.0000 mL | INTRAVENOUS | Status: AC | PRN
  Administered 2022-04-05: 2 mL via INTRAVENOUS

## 2022-04-05 MED ORDER — SODIUM CHLORIDE 0.9 % IV SOLN
250.0000 mL | INTRAVENOUS | Status: DC
  Administered 2022-04-05 (×2): 250 mL via INTRAVENOUS

## 2022-04-05 MED ORDER — PANTOPRAZOLE SODIUM 40 MG IV SOLR
40.0000 mg | Freq: Every day | INTRAVENOUS | Status: DC
  Administered 2022-04-05 – 2022-04-06 (×2): 40 mg via INTRAVENOUS
  Filled 2022-04-05 (×3): qty 10

## 2022-04-05 MED ORDER — ALBUTEROL SULFATE (2.5 MG/3ML) 0.083% IN NEBU
10.0000 mg | INHALATION_SOLUTION | Freq: Once | RESPIRATORY_TRACT | Status: AC
  Administered 2022-04-05: 10 mg via RESPIRATORY_TRACT
  Filled 2022-04-05: qty 12

## 2022-04-05 MED ORDER — CHLORHEXIDINE GLUCONATE CLOTH 2 % EX PADS
6.0000 | MEDICATED_PAD | Freq: Every day | CUTANEOUS | Status: DC
  Administered 2022-04-06 (×2): 6 via TOPICAL

## 2022-04-05 MED ORDER — CHLORHEXIDINE GLUCONATE CLOTH 2 % EX PADS
6.0000 | MEDICATED_PAD | Freq: Every day | CUTANEOUS | Status: DC
  Administered 2022-04-05: 6 via TOPICAL

## 2022-04-05 MED ORDER — HEPARIN SODIUM (PORCINE) 5000 UNIT/ML IJ SOLN
5000.0000 [IU] | Freq: Three times a day (TID) | INTRAMUSCULAR | Status: DC
  Administered 2022-04-05 – 2022-04-18 (×27): 5000 [IU] via SUBCUTANEOUS
  Filled 2022-04-05 (×34): qty 1

## 2022-04-05 MED ORDER — POLYETHYLENE GLYCOL 3350 17 G PO PACK
17.0000 g | PACK | Freq: Every day | ORAL | Status: DC | PRN
  Filled 2022-04-05: qty 1

## 2022-04-05 MED ORDER — ORAL CARE MOUTH RINSE
15.0000 mL | Freq: Two times a day (BID) | OROMUCOSAL | Status: DC
  Administered 2022-04-05 – 2022-04-18 (×12): 15 mL via OROMUCOSAL

## 2022-04-05 MED ORDER — DOCUSATE SODIUM 100 MG PO CAPS
100.0000 mg | ORAL_CAPSULE | Freq: Two times a day (BID) | ORAL | Status: DC | PRN
  Administered 2022-04-12: 100 mg via ORAL
  Filled 2022-04-05 (×2): qty 1

## 2022-04-05 MED ORDER — DEXTROSE 50 % IV SOLN
1.0000 | Freq: Once | INTRAVENOUS | Status: AC
  Administered 2022-04-05: 50 mL via INTRAVENOUS
  Filled 2022-04-05: qty 50

## 2022-04-05 MED ORDER — CHLORHEXIDINE GLUCONATE 0.12 % MT SOLN
15.0000 mL | Freq: Two times a day (BID) | OROMUCOSAL | Status: DC
  Administered 2022-04-05 – 2022-04-17 (×14): 15 mL via OROMUCOSAL
  Filled 2022-04-05 (×19): qty 15

## 2022-04-05 MED ORDER — SODIUM CHLORIDE 0.9 % IV SOLN
2.0000 g | INTRAVENOUS | Status: AC
  Administered 2022-04-05 – 2022-04-11 (×7): 2 g via INTRAVENOUS
  Filled 2022-04-05 (×8): qty 20

## 2022-04-05 MED ORDER — NOREPINEPHRINE 4 MG/250ML-% IV SOLN
2.0000 ug/min | INTRAVENOUS | Status: DC
  Administered 2022-04-05: 7 ug/min via INTRAVENOUS
  Administered 2022-04-05: 5 ug/min via INTRAVENOUS
  Filled 2022-04-05 (×2): qty 250

## 2022-04-05 MED ORDER — INSULIN ASPART 100 UNIT/ML IJ SOLN
0.0000 [IU] | INTRAMUSCULAR | Status: DC
  Administered 2022-04-05: 3 [IU] via SUBCUTANEOUS
  Administered 2022-04-06 – 2022-04-12 (×3): 2 [IU] via SUBCUTANEOUS
  Administered 2022-04-13: 3 [IU] via SUBCUTANEOUS
  Administered 2022-04-13 – 2022-04-17 (×3): 2 [IU] via SUBCUTANEOUS

## 2022-04-05 NOTE — ED Notes (Signed)
Only able to obtain 1 set of blood cultures.

## 2022-04-05 NOTE — Sepsis Progress Note (Signed)
Monitoring for the code sepsis protocol. °

## 2022-04-05 NOTE — ED Notes (Signed)
Patient was wet linens changed and patient repositioned, still on bearhugger.

## 2022-04-05 NOTE — Progress Notes (Signed)
An USGPIV (ultrasound guided PIV) has been placed for short-term vasopressor infusion. A correctly placed ivWatch must be used when administering Vasopressors. Should this treatment be needed beyond 72 hours, central line access should be obtained.  It will be the responsibility of the bedside nurse to follow best practice to prevent extravasations.   ?

## 2022-04-05 NOTE — Progress Notes (Signed)
   Palliative Medicine Inpatient Follow Up Note   Palliative care has acknowledged the consult for [name] [name].  Have left VM's for patients daughter, [name] [name] and granddaughter, [name] [name].   Presently, awaiting a call back to schedule a family meeting.  No Charge ______________________________________________________________________________________ [place] Team Team Cell Phone: [phone] Please utilize secure chat with additional questions, if there is no response within 30 minutes please call the above phone number  Palliative Medicine Team providers are available by phone from 7am to 7pm daily and can be reached through the team cell phone.  Should this patient require assistance outside of these hours, please call the patient's attending physician.

## 2022-04-05 NOTE — H&P (Signed)
NAME:  [name] [name], MRN:  [id number], DOB:  April 13, 1930, LOS: 0 ADMISSION DATE:  04/04/2022, CONSULTATION DATE:  5/27 REFERRING MD:  Roxanne Mins, CHIEF COMPLAINT:  AMS   History of Present Illness:  Patient is encephalopathic and/or intubated. Therefore history has been obtained from chart review.   [name] [name], is a 88 y.o. female, who presented to the [facility] ED with a chief complaint of AMS  They have a pertinent past medical history of DM2, HLD, HTN, CKD 4, dementia  While at [facility] SNF patient became bradycardic with heart rate in the 30s to 40s, EMS was called.  She was found to be hypothermic in the emergency department with a temperature of 89.1 F, 59/39, WBC 5.3, lactic 2.2 > 3.7.  A code sepsis was called.  Blood cultures were ordered.  She was started on ceftriaxone.  PCCM was consulted for admission  Pertinent  Medical History  DM2, HLD, HTN, CKD 4, dementia  Significant Hospital Events: Including procedures, antibiotic start and stop dates in addition to other pertinent events   5/27 presented to [facility], PCCM consult, ceftriaxone  Interim History / Subjective:  See above  Unable to obtain subjective evaluation due to patient status  Objective   Blood pressure 102/66, pulse 75, temperature (!) 91.2 F (32.9 C), temperature source Rectal, resp. rate 14, SpO2 96 %. On 3LNC       No intake or output data in the 24 hours ending 04/05/22 0443 There were no vitals filed for this visit.  Examination: General: In bed, NAD, appears comfortable, frail appearing HEENT: MM pink/moist, anicteric, atraumatic Neuro: RASS -2, left eye 43mm, right eye 88mm, reactive to light bilaterally, Thumbs up to command, incomprehensible speech, opens eyes to voice CV: S1S2, NSR, no m/r/g appreciated PULM:  air movement in all lobes, trachea midline, chest expansion symmetric GI: soft, bsx4 active, non-tender   Extremities: warm/dry, no pretibial edema, capillary refill less than 3 seconds  Skin:  no  rashes or lesions noted  Labs Troponin 14 K6.3 Creatinine 2.58, BUN 84 (baseline 1.8-2.1) Hemoglobin 11.8 WBC 5.3 Platelet 81 Valproic acid level within normal limits Mag 2.1 UA UC pending Blood cultures pending Lactic acid 2.2 > 3.7  X-ray: No pneumo, no effusion, no infiltrate, vascular congestion Twelve-lead: Sinus bradycardia, question first-degree heart block, no ST changes noted  Resolved Hospital Problem list     Assessment & Plan:  Shock, suspect septic, UA looks dirty. Hypotension could be related to bradycardia. Troponin WNL Hypothermia, secondary to above Lactic acidosis, secondary to shock Septic Shock -Goal MAP 65 or greater. Peripheral levophed ordered. Titrate medication to goal -S/P 4L fluid resuscitation -Follow up [facility] and UC -Continue ceftriaxone. Narrow as cultures result. -Trend lactate -Obtain procalcitonin, MRSA PCR -Obtain ECHO -Goal rewarming 1 degree or less an hour.  Acute metabolic encephalopathy, suspect secondary to sepsis Dementia -Continue supportive care -Obtain head CT -follow up home medication regimen  Hyperkalemia K 6.3, S/P 4 L IVF resuscitation. Temporized in ED with Dextrose/insulin, albuterol -stat bmp, if K still elevated, temporize  Bradycardia NSR on examLast echo 8/22-LVEF 50 to 55%, grade 2 DD, RVSF WNL. K level could be contributing -continious tele -Montor  Thrombocytopenia Suspect secondary to sepsis -Monitor -Supportive care  DM2 -Blood Glucose goal 140-180. -SSI  HLD, HX HTN Currently hypotensive -Holding home antihypertensives at this time  CKD4 on AKI Creatinine 2.58, BUN 84 (baseline 1.8-2.1), secondary to shock -Ensure renal perfusion. Goal MAP 65 or greater. -Avoid neprotoxic drugs as possible. -  Strict I&O's -Follow up AM creatinine -Continue foley  DNR -ongoing [place] conversations  Best Practice (right click and "Reselect all SmartList Selections" daily)   Diet/type: NPO w/ oral meds DVT  prophylaxis: prophylactic heparin  GI prophylaxis: PPI Lines: N/A Foley:  N/A Code Status:  DNR Last date of multidisciplinary goals of care discussion [pending]  Labs   CBC: Recent Labs  Lab 04/04/22 2318  WBC 5.3  NEUTROABS 1.9  HGB 11.8*  HCT 38.7  MCV 100.8*  PLT 81*    Basic Metabolic Panel: Recent Labs  Lab 04/04/22 2318  NA 140  K 6.3*  CL 109  CO2 21*  GLUCOSE 109*  BUN 84*  CREATININE 2.58*  CALCIUM 9.8  MG 2.1   GFR: CrCl cannot be calculated (Unknown ideal weight.). Recent Labs  Lab 04/04/22 2318 04/05/22 0251  WBC 5.3  --   LATICACIDVEN 2.2* 3.7*    Liver Function Tests: Recent Labs  Lab 04/04/22 2318  AST 23  ALT 28  ALKPHOS 65  BILITOT 0.4  PROT 7.1  ALBUMIN 3.4*   No results for input(s): LIPASE, AMYLASE in the last 168 hours. No results for input(s): AMMONIA in the last 168 hours.  ABG No results found for: PHART, PCO2ART, PO2ART, HCO3, TCO2, ACIDBASEDEF, O2SAT   Coagulation Profile: Recent Labs  Lab 04/05/22 0015  INR 1.1    Cardiac Enzymes: No results for input(s): CKTOTAL, CKMB, CKMBINDEX, TROPONINI in the last 168 hours.  HbA1C: Hgb A1c MFr Bld  Date/Time Value Ref Range Status  06/19/2021 07:12 AM 5.6 4.8 - 5.6 % Final    Comment:    (NOTE) Pre diabetes:          5.7%-6.4%  Diabetes:              >6.4%  Glycemic control for   <7.0% adults with diabetes   02/04/2021 08:11 PM 5.9 (H) 4.8 - 5.6 % Final    Comment:    (NOTE) Pre diabetes:          5.7%-6.4%  Diabetes:              >6.4%  Glycemic control for   <7.0% adults with diabetes     CBG: Recent Labs  Lab 04/05/22 0137  GLUCAP 206*    Review of Systems:   Unable to obtain review of systems due to patient status  Past Medical History:  She,  has a past medical history of Diabetes mellitus, Hyperlipidemia, and Hypertension.   Surgical History:   Past Surgical History:  Procedure Laterality Date   APPENDECTOMY     CHOLECYSTECTOMY      COLONOSCOPY  06/23/2012   Procedure: COLONOSCOPY;  Surgeon: Lear Ng, MD;  Location: [facility] ENDOSCOPY;  Service: Endoscopy;  Laterality: N/A;   ESOPHAGOGASTRODUODENOSCOPY  06/23/2012   Procedure: ESOPHAGOGASTRODUODENOSCOPY (EGD);  Surgeon: Lear Ng, MD;  Location: [facility] ENDOSCOPY;  Service: Endoscopy;  Laterality: N/A;     Social History:   reports that she has never smoked. She has never used smokeless tobacco. She reports that she does not drink alcohol and does not use drugs.   Family History:  Her family history is not on file.   Allergies Allergies  Allergen Reactions   Alendronate     Other reaction(s): Other (See Comments) Poor renal function.     Home Medications  Prior to Admission medications   Medication Sig Start Date End Date Taking? Authorizing Provider  acetaminophen (TYLENOL) 325 MG tablet Take 2 tablets (  650 mg total) by mouth every 6 (six) hours as needed for mild pain (or Fever >/= 101). 09/20/21  Yes Nolberto Hanlon, MD  atorvastatin (LIPITOR) 20 MG tablet Take 20 mg by mouth daily. 06/13/21  Yes [provider]  divalproex (DEPAKOTE SPRINKLE) 125 MG capsule Take 250 mg by mouth 3 (three) times daily.   Yes [provider]  loperamide (IMODIUM) 2 MG capsule Take 1 capsule (2 mg total) by mouth as needed for diarrhea or loose stools (upto 3 times a day). 10/14/21  Yes Pokhrel, Laxman, MD  LORazepam (ATIVAN) 2 MG/ML injection Inject 1 mg into the muscle See admin instructions. Every 12 hours x 14 days for severe anxiety and agitation due to UTI   Yes [provider]  nitrofurantoin, macrocrystal-monohydrate, (MACROBID) 100 MG capsule Take 100 mg by mouth See admin instructions. Bid x 5 days   Yes [provider]  Nutritional Supplements (NUTRITIONAL SUPPLEMENT PO) Take 120 mLs by mouth 3 (three) times daily.   Yes [provider]  pantoprazole (PROTONIX) 40 MG tablet Take 40 mg by mouth daily.   Yes [provider]  PRESCRIPTION MEDICATION Place 0.5 mLs onto the skin in the morning and at bedtime. Lorazepam gel 1mg /ml   Yes [provider]  QUEtiapine (SEROQUEL) 25 MG tablet Take 25 mg by mouth at bedtime.   Yes [provider]  Vitamin D, Ergocalciferol, (DRISDOL) 1.25 MG (50000 UNIT) CAPS capsule Take 50,000 Units by mouth every Friday. 06/06/21  Yes [provider]  diphenhydrAMINE (BENADRYL) 50 MG/ML injection Inject 50 mg into the vein stat. Patient not taking: Reported on 04/05/2022    [provider]  LORazepam (ATIVAN) 2 MG/ML injection Inject 2 mg into the muscle stat. Patient not taking: Reported on 04/05/2022    [provider]  LORazepam (ATIVAN) 2 MG/ML injection Inject 2 mg into the muscle See admin instructions. Every 12 hours x 14 days as needed for severe anxiety Patient not taking: Reported on 04/05/2022    [provider]     Critical care time: 42 minutes     Redmond School., MSN, APRN, AGACNP-BC [facility]  04/05/2022 , 5:04 AM  Please see [email or web address] for pager details  If no response, please call [phone] After hours, please call Elink at [phone]

## 2022-04-05 NOTE — Progress Notes (Signed)
04/05/2022  Seen and examined. Remains encephalopathic Levophed doses are escalating Lactic acid is rising Repeat labs pending On exam, still looks dry, ext warm, withdraws x 4 If labs are worse would advocate for comfort care If full scope desired will need line Not a candidate for dialysis Will need GOC talk, palliative consult placed, I can start talks if time is limited  Additional 35 min cc time Erskine Emery MD PCCM

## 2022-04-05 NOTE — ED Notes (Signed)
CCM at bedside 

## 2022-04-05 NOTE — Sepsis Progress Note (Addendum)
Notified provider of need to order vasopressors or additional IVF bolus.  Per MD, will order more IVF.

## 2022-04-05 NOTE — Sepsis Progress Note (Signed)
Notified provider of need to order repeat lactic acid. ° °

## 2022-04-05 NOTE — Consult Note (Incomplete)
Palliative Medicine Inpatient Consult Note  Consulting Provider: Smith, Daniel C, MD  Reason for consult:   Palliative Care Consult Services Palliative Medicine Consult  Reason for Consult? 88 year old demented DNR with severe sepsis   04/05/2022  HPI:  Per intake H&P --> Briefly, 88 year old female SNF resident with dementia, CKD stage IV, HTN and DM2 who presents for altered mental status and bradycardia. In the ED she was hypothermic with lactic acidosis to 3.7. Started on antibiotics for suspected UTI.In ICU for urosepsis on support with levophed. Palliative care has been asked to get involved to discuss goals of care.   Clinical Assessment/Goals of Care:  *Please note that this is a verbal dictation therefore any spelling or grammatical errors are due to the "Dragon Medical One" system interpretation.  I have reviewed medical records including EPIC notes, labs and imaging, received report from bedside RN, assessed the patient.    I met with *** to further discuss diagnosis prognosis, GOC, EOL wishes, disposition and options.   I introduced Palliative Medicine as specialized medical care for people living with serious illness. It focuses on providing relief from the symptoms and stress of a serious illness. The goal is to improve quality of life for both the patient and the family.  Medical History Review and Understanding:  Concern about patients compliance with medications as she often refuses them in the nursing facility inhibiting them from getting her daily medications and antibiotics into her.  Social History:    Functional and Nutritional State:  I was able to speak to patient nurse ar [place], [name] [name]. She shares that [name] [name] requires 1(+) assistance with all bADLs. She has some episodes of combativeness at the facility. She has a fair appetite though sometimes will not eat anything depending on her behaviors that day.   Palliative Symptoms:    Advance  Directives: A detailed discussion was had today regarding advanced directives.    Code Status: Concepts specific to code status, artifical feeding and hydration, continued IV antibiotics and rehospitalization was had.  The difference between a aggressive medical intervention path  and a palliative comfort care path for this patient at this time was had.   Encouraged patient/family to consider DNR/DNI status understanding evidenced based poor outcomes in similar hospitalized patient, as the cause of arrest is likely associated with advanced chronic/terminal illness rather than an easily reversible acute cardio-pulmonary event. I explained that DNR/DNI does not change the medical plan and it only comes into effect after a person has arrested (died).  It is a protective measure to keep us from harming the patient in their last moments of life. *** was agreeable to DNR/DNI with understanding that patient would not receive CPR, defibrillation, ACLS medications, or intubation.   Discussion:    Discussed the importance of continued conversation with family and their  medical providers regarding overall plan of care and treatment options, ensuring decisions are within the context of the patients values and GOCs.  Provided *** "Hard Choices for Loving People" booklet.   Provided *** "Gone From My Site" booklet.  Decision Maker:  SUMMARY OF RECOMMENDATIONS    Code Status/Advance Care Planning: FULL CODE  DNAR/DNI  Modified CODE   Symptom Management:   Palliative Prophylaxis:  Aspiration, Bowel Regimen, Delirium Protocol, Frequent Pain Assessment, Oral Care, Palliative Wound Care, and Turn Reposition  Additional Recommendations (Limitations, Scope, Preferences): Avoid Hospitalization, Full Scope Treatment, No Artificial Feeding, No Surgical Procedures, and No Tracheostomy  Psycho-social/Spiritual:  Desire for   further Chaplaincy support:  Additional Recommendations:    Prognosis:    Discharge Planning:   ROS  Oral Intake %:   I/O:   Bowel Movements:   Mobility:   Vitals:   04/05/22 0855 04/05/22 0900  BP: 125/75 123/77  Pulse: (!) 102 (!) 105  Resp: (!) 24 19  Temp: 97.8 F (36.6 C)   SpO2: 98% 100%   No intake or output data in the 24 hours ending 04/05/22 0956 Last Weight  Most recent update: 04/05/2022  9:05 AM    Weight  94 kg (207 lb 3.7 oz)             Gen:  NAD HEENT: moist mucous membranes CV: Regular rate and rhythm, no murmurs rubs or gallops PULM: clear to auscultation bilaterally. No wheezes/rales/rhonchi*** ABD: soft/nontender/nondistended/normal bowel sounds*** EXT: No edema*** Neuro: Alert and oriented x3***  PPS:   This conversation/these recommendations were discussed with patient primary care team, Dr. Marland Kitchen  Time In: Time Out: Total Time: ***  Billing based on MDM: ***  {Problems Addressed:304933}  {Amount and/or Complexity of XTKW:409735}  {Risks:304936} ______________________________________________________ [place] Palliative Medicine Team Team Cell Phone: [phone] Please utilize secure chat with additional questions, if there is no response within 30 minutes please call the above phone number  Palliative Medicine Team providers are available by phone from 7am to 7pm daily and can be reached through the team cell phone.  Should this patient require assistance outside of these hours, please call the patient's attending physician.

## 2022-04-05 NOTE — Progress Notes (Signed)
Labs fortunately improved. Continue supportive care as outlined, will speak with family when they come in.  Erskine Emery MD PCCM

## 2022-04-05 NOTE — Sepsis Progress Note (Signed)
Notified bedside nurse of need to draw repeat lactic acid. 

## 2022-04-06 DIAGNOSIS — A419 Sepsis, unspecified organism: Secondary | ICD-10-CM | POA: Diagnosis not present

## 2022-04-06 DIAGNOSIS — R6521 Severe sepsis with septic shock: Secondary | ICD-10-CM | POA: Diagnosis not present

## 2022-04-06 DIAGNOSIS — Z515 Encounter for palliative care: Secondary | ICD-10-CM | POA: Diagnosis not present

## 2022-04-06 DIAGNOSIS — Z7189 Other specified counseling: Secondary | ICD-10-CM | POA: Diagnosis not present

## 2022-04-06 LAB — CBC
HCT: 31.5 % — ABNORMAL LOW (ref 36.0–46.0)
Hemoglobin: 9.9 g/dL — ABNORMAL LOW (ref 12.0–15.0)
MCH: 31.3 pg (ref 26.0–34.0)
MCHC: 31.4 g/dL (ref 30.0–36.0)
MCV: 99.7 fL (ref 80.0–100.0)
Platelets: 96 10*3/uL — ABNORMAL LOW (ref 150–400)
RBC: 3.16 MIL/uL — ABNORMAL LOW (ref 3.87–5.11)
RDW: 13.4 % (ref 11.5–15.5)
WBC: 5.6 10*3/uL (ref 4.0–10.5)
nRBC: 0 % (ref 0.0–0.2)

## 2022-04-06 LAB — GLUCOSE, CAPILLARY
Glucose-Capillary: 46 mg/dL — ABNORMAL LOW (ref 70–99)
Glucose-Capillary: 65 mg/dL — ABNORMAL LOW (ref 70–99)
Glucose-Capillary: 86 mg/dL (ref 70–99)
Glucose-Capillary: 138 mg/dL — ABNORMAL HIGH (ref 70–99)
Glucose-Capillary: 80 mg/dL (ref 70–99)
Glucose-Capillary: 122 mg/dL — ABNORMAL HIGH (ref 70–99)
Glucose-Capillary: 83 mg/dL (ref 70–99)
Glucose-Capillary: 76 mg/dL (ref 70–99)
Glucose-Capillary: 114 mg/dL — ABNORMAL HIGH (ref 70–99)

## 2022-04-06 LAB — BASIC METABOLIC PANEL
Anion gap: 6 (ref 5–15)
BUN: 64 mg/dL — ABNORMAL HIGH (ref 8–23)
CO2: 23 mmol/L (ref 22–32)
Calcium: 9.5 mg/dL (ref 8.9–10.3)
Chloride: 110 mmol/L (ref 98–111)
Creatinine, Ser: 2.04 mg/dL — ABNORMAL HIGH (ref 0.44–1.00)
GFR, Estimated: 23 mL/min — ABNORMAL LOW (ref >60)
Glucose, Bld: 83 mg/dL (ref 70–99)
Potassium: 6 mmol/L — ABNORMAL HIGH (ref 3.5–5.1)
Sodium: 139 mmol/L (ref 135–145)

## 2022-04-06 LAB — PHOSPHORUS: Phosphorus: 4 mg/dL (ref 2.5–4.6)

## 2022-04-06 LAB — MAGNESIUM: Magnesium: 1.8 mg/dL (ref 1.7–2.4)

## 2022-04-06 MED ORDER — ACETAMINOPHEN 325 MG PO TABS
650.0000 mg | ORAL_TABLET | Freq: Four times a day (QID) | ORAL | Status: DC | PRN
  Administered 2022-04-06: 650 mg via ORAL
  Filled 2022-04-06 (×2): qty 2

## 2022-04-06 MED ORDER — ALBUTEROL SULFATE (2.5 MG/3ML) 0.083% IN NEBU
10.0000 mg | INHALATION_SOLUTION | Freq: Once | RESPIRATORY_TRACT | Status: AC
  Administered 2022-04-06: 10 mg via RESPIRATORY_TRACT
  Filled 2022-04-06: qty 12

## 2022-04-06 MED ORDER — DEXTROSE 50 % IV SOLN
1.0000 | Freq: Once | INTRAVENOUS | Status: AC
  Administered 2022-04-06: 50 mL via INTRAVENOUS
  Filled 2022-04-06: qty 50

## 2022-04-06 MED ORDER — INSULIN ASPART 100 UNIT/ML IV SOLN
5.0000 [IU] | Freq: Once | INTRAVENOUS | Status: AC
  Administered 2022-04-06: 5 [IU] via INTRAVENOUS

## 2022-04-06 MED ORDER — CALCIUM GLUCONATE-NACL 1-0.675 GM/50ML-% IV SOLN
1.0000 g | Freq: Once | INTRAVENOUS | Status: AC
  Administered 2022-04-06: 1000 mg via INTRAVENOUS
  Filled 2022-04-06: qty 50

## 2022-04-06 MED ORDER — CALCIUM GLUCONATE 10 % IV SOLN
1.0000 g | Freq: Once | INTRAVENOUS | Status: DC
  Filled 2022-04-06: qty 10

## 2022-04-06 MED ORDER — SODIUM ZIRCONIUM CYCLOSILICATE 10 G PO PACK
10.0000 g | PACK | Freq: Once | ORAL | Status: AC
  Administered 2022-04-06: 10 g via ORAL
  Filled 2022-04-06: qty 1

## 2022-04-06 MED ORDER — QUETIAPINE FUMARATE 25 MG PO TABS
25.0000 mg | ORAL_TABLET | Freq: Every day | ORAL | Status: DC
  Administered 2022-04-06 – 2022-04-11 (×4): 25 mg via ORAL
  Filled 2022-04-06 (×7): qty 1

## 2022-04-06 NOTE — Consult Note (Addendum)
Palliative Medicine Inpatient Consult Note  Consulting Provider: Candee Furbish, MD  Reason for consult:   [facility] Palliative Medicine Consult  Reason for Consult? 88 year old demented DNR with severe sepsis   04/06/2022  HPI:  Per intake H&P --> [name] [name], is a 88 y.o. female, who presented to the [facility] ED with a chief complaint of AMS. They have a pertinent past medical history of DM2, HLD, HTN, CKD 4, dementia. While at [facility] SNF patient became bradycardic with heart rate in the 30s to 40s, EMS was called. She was found to be hypothermic in the emergency department with a temperature of 89.1 F, 59/39, WBC 5.3, lactic 2.2 > 3.7.  A code sepsis was called.  Blood cultures were ordered.  She was started on ceftriaxone and sent to the ICU for pressor support. She has been weaned off pressors and is now stable and transitioning to the post acute care unit.   Palliative care has been asked to get involved to further discuss goals of care.   Clinical Assessment/Goals of Care:  *Please note that this is a verbal dictation therefore any spelling or grammatical errors are due to the "[employer] One" system interpretation.  I have reviewed medical records including EPIC notes, labs and imaging, received report from bedside RN - patient had e/o sundowning, assessed the patient who was resting in bed. She was pleasant and aware of who she was, in no distress.   Of note [name] [name]'s family has been exceptionally difficult to get a hold of please review prior palliative progress notes as well as clinical Education officer, museum notes.  A well care visit was performed this morning and patient's daughter, [name] [name] was able to participate in a phone conversation.  She plans to come into the hospital between the hours of 2 to 3 PM today.   I called [name] [name] to further discuss diagnosis prognosis, GOC, EOL wishes, disposition and options.   I introduced Palliative Medicine as  specialized medical care for people living with serious illness. It focuses on providing relief from the symptoms and stress of a serious illness. The goal is to improve quality of life for both the patient and the family.  Medical History Review and Understanding:  Discussed patient's past medical history of CKD, depression, anemia, CKD, dementia with behavioral symptoms.  Reviewed the reasons for admission in the setting of urosepsis.  Discussed with patient's daughter that this warranted admission to the intensive care unit and use of pressors given the degree of patient's sepsis.  Social History:  [name] [name] lives in [place], [place].  She is a widow.  She had 7 children 4 of whom are living.  She formally worked as a Quarry manager.  She is a woman of faith and practices within the [facility] denomination.  Functional and Nutritional State:  Prior to admission [name] [name] had been living at [facility] for the past few months.  She was prior living with her daughter [name] [name] in a single-family home though her care needs were too great and she was transition to long-term care at [facility].  I was able to call and speak to the staff at [facility] who share that [name] [name] is reliant on staff and is a 1 person assist for all basic activities of daily living.  She is able to feed herself independently.  She has a variable appetite as sometimes she refuses to eat due to her behavioral symptoms associated with her dementia.  Advance  Directives:  A detailed discussion was had today regarding advanced directives.  There are no advanced directives identified on [place] though patient's daughter, [name] [name] expresses that she is the patient's surrogate Media planner.  I am concerned that with the lack of advance directives and the knowledge that there are 3 other children that it may be prudent to obtain their contact information as well.  Code Status:  [name] [name] is an established DNAR/DNI.  Discussion:  I shared with  [name] [name] my concerns over [name]'s health state in the setting of her behavioral symptoms.  We reviewed that this is inhibiting her from getting necessary medications and I worry that her hospital admission is in the setting of her having not taken her antibiotics for an established urinary infection on May 1.  I gently broached the topic of hospice care as I suspect [name] [name] will continue to circulate in and out of the hospital given her lack of ability to comply with appropriate medical care.  Patient's daughter [name] [name] shared she was overwhelmed at the concept of hospice.  Alternatively I talked about outpatient palliative support which would support [name] and if she continued to decline more conversations could be held regarding the topic of hospice transition.  We reviewed that there are some alternatives in terms of medication management to help Najat's behavioral symptoms inclusive of low-dose Seroquel.  Discussed meeting later in the day for further conversations.   Discussed the importance of continued conversation with family and their  medical providers regarding overall plan of care and treatment options, ensuring decisions are within the context of the patients values and GOCs. ________________________________________________________ Addendum:  Patients daughter, [name] [name] agreed to meet this afternoon between 2-3PM.   [name] [name] did not show up for out meeting.   Decision Maker: [name] [name] (daughter) [phone]  SUMMARY OF RECOMMENDATIONS   DNAR/DNI  Given that there are no advanced directives on file it would be prudent to obtain the names and numbers of patient's 3 other children  Gently broached the topic of outpatient hospice care given patient's noncompliance and rehospital admissions.  Described the differences between hospice and palliative care.  Patient would benefit from low-dose Seroquel 25 mg at 5 PM nightly to better aid in behavioral/agitational episodes during sundown  hours  We will request outpatient palliative support on discharge  We will continue to follow incrementally during hospital stay, I will not be present tomorrow though my colleague Josseline Burt Knack will see [name] [name].   Code Status/Advance Care Planning: DNAR/DNI  Palliative Prophylaxis:  Aspiration, Bowel Regimen, Delirium Protocol, Frequent Pain Assessment, Oral Care, Palliative Wound Care, and Turn Reposition  Additional Recommendations (Limitations, Scope, Preferences): Treat the treatable  Psycho-social/Spiritual:  Desire for further Chaplaincy support: No Additional Recommendations: Education on chronic disease trajectory   Prognosis: Patient has endured 2 hospital admissions in the last 6 months.  Patient has chronic comorbid conditions placing her at high risk for 60-month mortality.    Discharge Planning: Discharge to [facility] once medically optimized.  Vitals:   04/06/22 1143 04/06/22 1200  BP: (!) 146/55 126/65  Pulse: 90 62  Resp: 20 (!) 31  Temp:    SpO2: 98% 97%    Intake/Output Summary (Last 24 hours) at 04/06/2022 1226 Last data filed at 04/06/2022 1000 Gross per 24 hour  Intake 2710.54 ml  Output 850 ml  Net 1860.54 ml   Last Weight  Most recent update: 04/06/2022  4:17 AM    Weight  97.3 kg (214 lb 8.1 oz)  Gen: Elderly obese African-American female HEENT: Dry mucous membranes CV: Regular rate and rhythm PULM: On room air breathing is even and nonlabored ABD: soft/nontender EXT: No edema Neuro: Alert and oriented to self  PPS: 30%   This conversation/these recommendations were discussed with patient primary care team, Dr. Valeta Harms   Billing based on MDM: 117 --> Tremendous time spent to identify family members. Coordinated with MSW for a wellness check. Family was very difficult to contact.   Problems Addressed: One acute or chronic illness or injury that poses a threat to life or bodily function  Amount and/or Complexity of Data:  Category 3:Discussion of management or test interpretation with external physician/other qualified health care professional/appropriate source (not separately reported)  Risks: Decision not to resuscitate or to de-escalate care because of poor prognosis ______________________________________________________ [place] Team Team Cell Phone: [phone] Please utilize secure chat with additional questions, if there is no response within 30 minutes please call the above phone number  Palliative Medicine Team providers are available by phone from 7am to 7pm daily and can be reached through the team cell phone.  Should this patient require assistance outside of these hours, please call the patient's attending physician.
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
## 2022-04-06 NOTE — Progress Notes (Signed)
eLink Physician-Brief Progress Note Patient Name: Kerisha Goughnour DOB: 09-11-1930 MRN: [id number]   Date of Service  04/06/2022  HPI/Events of Note  Hyperkalemia of 6.7. Bicarb is ok  eICU Interventions  Insulin, d50, calcium, albuterol ordered Noted recent hypoglycemia so will check a glucose now first and withhold insulin therapy if hypoglycemic  Follow up K in 1 hour      Intervention Category Major Interventions: Electrolyte abnormality - evaluation and management  Dmetrius Ambs G Kamoni Gentles 04/06/2022, 5:59 AM

## 2022-04-06 NOTE — Progress Notes (Signed)
   Palliative Medicine Inpatient Follow Up Note   Patients granddaughter, Iran Ouch and daughter, [name] have been called six times. Three voicemail's in total for each. I have sent text messages to the cellular phones of each requesting a call back.   At this time I will request the help of the MSW to identify other family members.   I have spoken to [place] who continues to be concerned about Dakoda in the setting of her combative episodes and lack of want to take medications much of the time. They share that she had a UTI on May 1st identified and was prescribed antibiotics but would often refuse these.  Emmalie is a patient who would be hospice appropriate for consideration at this time, though I am unable to pursue this conversation given the lack of family responsiveness.  We will continue to attempt contact though if unsuccessful will need to sign off.  No Charge ______________________________________________________________________________________ [place] Team Team Cell Phone: [phone] Please utilize secure chat with additional questions, if there is no response within 30 minutes please call the above phone number  Palliative Medicine Team providers are available by phone from 7am to 7pm daily and can be reached through the team cell phone.  Should this patient require assistance outside of these hours, please call the patient's attending physician.

## 2022-04-06 NOTE — Progress Notes (Signed)
NAME:  [name] [name], MRN:  [id number], DOB:  Feb 28, 1930, LOS: 1 ADMISSION DATE:  04/04/2022, CONSULTATION DATE:  5/27 REFERRING MD:  Roxanne Mins, CHIEF COMPLAINT:  AMS   History of Present Illness:  Patient is encephalopathic and/or intubated. Therefore history has been obtained from chart review.   [name] [name], is a 88 y.o. female, who presented to the [facility] ED with a chief complaint of AMS  They have a pertinent past medical history of DM2, HLD, HTN, CKD 4, dementia  While at [facility] SN[facility] patient became bradycardic with heart rate in the 30s to 40s, EMS was called.  She was found to be hypothermic in the emergency department with a temperature of 89.1 F, 59/39, WBC 5.3, lactic 2.2 > 3.7.  A code sepsis was called.  Blood cultures were ordered.  She was started on ceftriaxone.  PCCM was consulted for admission  Pertinent  Medical History  DM2, HLD, HTN, CKD 4, dementia  Significant Hospital Events: Including procedures, antibiotic start and stop dates in addition to other pertinent events   5/27 presented to [facility], PCCM consult, ceftriaxone  Interim History / Subjective:   States that she is feeling better.  She would like to go home if possible.  Objective   Blood pressure 113/64, pulse (!) 103, temperature 98.2 F (36.8 C), temperature source Oral, resp. rate 19, height 6' (1.829 m), weight 97.3 kg, SpO2 98 %. On 3LNC        Intake/Output Summary (Last 24 hours) at 04/06/2022 0849 Last data filed at 04/06/2022 0700 Gross per 24 hour  Intake 2736.29 ml  Output 850 ml  Net 1886.29 ml   Filed Weights   04/05/22 0855 04/06/22 0417  Weight: 94 kg 97.3 kg    Examination: General: Elderly female, comfortable resting in bed HEENT: Mucous membranes moist, tracking appropriately Neuro: Alert following commands, does not know where she is, alert to person CV: Regular rate rhythm S1-S2, no telemetry changes related to hyperkalemia PULM: Clear to auscultation bilaterally GI: Soft,  nontender nondistended Extremities: Warm dry no significant edema Skin: No rash  Labs Reviewed Potassium 6.3  Resolved Hospital Problem list     Assessment & Plan:   Shock, suspect septic, UA looks dirty. Hypotension could be related to bradycardia. Troponin WNL Hypothermia, secondary to above Lactic acidosis, secondary to shock Septic Shock Plan: Patient has responded nicely to treatments for septic shock. Complete course of antibiotics.  Would complete 7 days ceftriaxone  Acute metabolic encephalopathy, suspect secondary to sepsis Dementia Continue supportive care Improved  Hyperkalemia K 6.3, S/P 4 L IVF resuscitation. Temporized in ED with Dextrose/insulin, albuterol Plan: Still has hyperkalemia. Again given treatments, insulin, d50, albuterol, and Lokelma today Repeat BMP at 1 PM.  Bradycardia NSR on examLast echo 8/22-LVEF 50 to 55%, grade 2 DD, RVSF WNL. K level could be contributing Plan: Continue to monitor  Thrombocytopenia Suspect secondary to sepsis Plan: Secondary to above, supportive care  DM2 -Blood Glucose goal 140-180. CBGs with SSI  HLD, HX HTN Currently hypotensive Continue to hold home antihypertensives Just came off of vasopressors overnight  CKD4 on AKI Creatinine 2.58, BUN 84 (baseline 1.8-2.1), secondary to shock Plan: Support blood pressure No indication for dialysis Still has urine output  DNR -ongoing [place] conversations  Weakness At her age I do think she needs PT OT eval Make sure that she is safe for consideration of discharge home.  Stable for transfer from the intensive care unit later this afternoon.  Best Practice (right  click and "Reselect all SmartList Selections" daily)   Diet/type: NPO w/ oral meds DVT prophylaxis: prophylactic heparin  GI prophylaxis: PPI Lines: N/A Foley:  N/A Code Status:  DNR Last date of multidisciplinary goals of care discussion [pending]  Labs   CBC: Recent Labs  Lab  04/04/22 2318 04/05/22 0725 04/06/22 0505  WBC 5.3 5.1 5.6  NEUTROABS 1.9  --   --   HGB 11.8* 9.6* 9.9*  HCT 38.7 31.0* 31.5*  MCV 100.8* 99.0 99.7  PLT 81* 89* 96*    Basic Metabolic Panel: Recent Labs  Lab 04/04/22 2318 04/05/22 0725 04/06/22 0505  NA 140 135 141  K 6.3* 5.2* 6.7*  CL 109 104 111  CO2 21* 21* 23  GLUCOSE 109* 81 89  BUN 84* 74* 65*  CREATININE 2.58* 2.40* 2.18*  CALCIUM 9.8 8.9 9.3  MG 2.1  --  1.8  PHOS  --   --  4.0   GFR: Estimated Creatinine Clearance: 22.4 mL/min (A) (by C-G formula based on SCr of 2.18 mg/dL (H)). Recent Labs  Lab 04/04/22 2318 04/05/22 0251 04/05/22 0725 04/06/22 0505  PROCALCITON  --   --  0.30  --   WBC 5.3  --  5.1 5.6  LATICACIDVEN 2.2* 3.7* 2.0*  --     Liver Function Tests: Recent Labs  Lab 04/04/22 2318  AST 23  ALT 28  ALKPHOS 65  BILITOT 0.4  PROT 7.1  ALBUMIN 3.4*   No results for input(s): LIPASE, AMYLASE in the last 168 hours. No results for input(s): AMMONIA in the last 168 hours.  ABG No results found for: PHART, PCO2ART, PO2ART, HCO3, TCO2, ACIDBASEDEF, O2SAT   Coagulation Profile: Recent Labs  Lab 04/05/22 0015  INR 1.1    Cardiac Enzymes: No results for input(s): CKTOTAL, CKMB, CKMBINDEX, TROPONINI in the last 168 hours.  HbA1C: Hgb A1c MFr Bld  Date/Time Value Ref Range Status  04/05/2022 07:25 AM 5.7 (H) 4.8 - 5.6 % Final    Comment:    (NOTE) Pre diabetes:          5.7%-6.4%  Diabetes:              >6.4%  Glycemic control for   <7.0% adults with diabetes   06/19/2021 07:12 AM 5.6 4.8 - 5.6 % Final    Comment:    (NOTE) Pre diabetes:          5.7%-6.4%  Diabetes:              >6.4%  Glycemic control for   <7.0% adults with diabetes     CBG: Recent Labs  Lab 04/06/22 0311 04/06/22 0342 04/06/22 0402 04/06/22 0628 04/06/22 0708  GLUCAP 46* 65* 86 138* 80    Review of Systems:   Unable to obtain review of systems due to patient status  Past Medical  History:  She,  has a past medical history of Diabetes mellitus, Hyperlipidemia, and Hypertension.   Surgical History:   Past Surgical History:  Procedure Laterality Date   APPENDECTOMY     CHOLECYSTECTOMY     COLONOSCOPY  06/23/2012   Procedure: COLONOSCOPY;  Surgeon: Lear Ng, MD;  Location: [facility] ENDOSCOPY;  Service: Endoscopy;  Laterality: N/A;   ESOPHAGOGASTRODUODENOSCOPY  06/23/2012   Procedure: ESOPHAGOGASTRODUODENOSCOPY (EGD);  Surgeon: Lear Ng, MD;  Location: [facility] ENDOSCOPY;  Service: Endoscopy;  Laterality: N/A;     Social History:   reports that she has never smoked. She has never used smokeless  tobacco. She reports that she does not drink alcohol and does not use drugs.   Family History:  Her family history is not on file.   Allergies Allergies  Allergen Reactions   Alendronate     Other reaction(s): Other (See Comments) Poor renal function.     Home Medications  Prior to Admission medications   Medication Sig Start Date End Date Taking? Authorizing Provider  acetaminophen (TYLENOL) 325 MG tablet Take 2 tablets (650 mg total) by mouth every 6 (six) hours as needed for mild pain (or Fever >/= 101). 09/20/21  Yes Nolberto Hanlon, MD  atorvastatin (LIPITOR) 20 MG tablet Take 20 mg by mouth daily. 06/13/21  Yes [provider]  divalproex (DEPAKOTE SPRINKLE) 125 MG capsule Take 250 mg by mouth 3 (three) times daily.   Yes [provider]  loperamide (IMODIUM) 2 MG capsule Take 1 capsule (2 mg total) by mouth as needed for diarrhea or loose stools (upto 3 times a day). 10/14/21  Yes Pokhrel, Laxman, MD  LORazepam (ATIVAN) 2 MG/ML injection Inject 1 mg into the muscle See admin instructions. Every 12 hours x 14 days for severe anxiety and agitation due to UTI   Yes [provider]  nitrofurantoin, macrocrystal-monohydrate, (MACROBID) 100 MG capsule Take 100 mg by mouth See admin instructions. Bid x 5 days   Yes [provider]   Nutritional Supplements (NUTRITIONAL SUPPLEMENT PO) Take 120 mLs by mouth 3 (three) times daily.   Yes [provider]  pantoprazole (PROTONIX) 40 MG tablet Take 40 mg by mouth daily.   Yes [provider]  PRESCRIPTION MEDICATION Place 0.5 mLs onto the skin in the morning and at bedtime. Lorazepam gel 1mg /ml   Yes [provider]  QUEtiapine (SEROQUEL) 25 MG tablet Take 25 mg by mouth at bedtime.   Yes [provider]  Vitamin D, Ergocalciferol, (DRISDOL) 1.25 MG (50000 UNIT) CAPS capsule Take 50,000 Units by mouth every Friday. 06/06/21  Yes [provider]  diphenhydrAMINE (BENADRYL) 50 MG/ML injection Inject 50 mg into the vein stat. Patient not taking: Reported on 04/05/2022    [provider]  LORazepam (ATIVAN) 2 MG/ML injection Inject 2 mg into the muscle stat. Patient not taking: Reported on 04/05/2022    [provider]  LORazepam (ATIVAN) 2 MG/ML injection Inject 2 mg into the muscle See admin instructions. Every 12 hours x 14 days as needed for severe anxiety Patient not taking: Reported on 04/05/2022    [provider]    This patient is critically ill with multiple organ system failure; which, requires frequent high complexity decision making, assessment, support, evaluation, and titration of therapies. This was completed through the application of advanced monitoring technologies and extensive interpretation of multiple databases. During this encounter critical care time was devoted to patient care services described in this note for 32 minutes.    Garner Nash, DO [facility] 04/06/2022 8:51 AM

## 2022-04-06 NOTE — Progress Notes (Signed)
Hypoglycemic Event  CBG: 46  Treatment: 8 oz juice/soda  Symptoms: None  Follow-up CBG: Time:0342 CBG Result:65  Additional 8oz fluid given. Repeat CBG 86 at 0402.   Possible Reasons for Event: Inadequate meal intake   Delrae Sawyers

## 2022-04-06 NOTE — Progress Notes (Signed)
[place] Progress Note Patient Name: Undrea Archbold DOB: 1930-07-22 MRN: [id number]   Date of Service  04/06/2022  HPI/Events of Note  Notified of generalized discomfort and patient requesting tylenol.   LFTs within normal limits and patient is able to take PO.  eICU Interventions  Tylenol PRN ordered.      Intervention Category Minor Interventions: Other:  Elsie Lincoln 04/06/2022, 11:30 PM

## 2022-04-07 DIAGNOSIS — R6521 Severe sepsis with septic shock: Secondary | ICD-10-CM | POA: Diagnosis not present

## 2022-04-07 DIAGNOSIS — A419 Sepsis, unspecified organism: Secondary | ICD-10-CM | POA: Diagnosis not present

## 2022-04-07 LAB — GLUCOSE, CAPILLARY
Glucose-Capillary: 100 mg/dL — ABNORMAL HIGH (ref 70–99)
Glucose-Capillary: 140 mg/dL — ABNORMAL HIGH (ref 70–99)
Glucose-Capillary: 80 mg/dL (ref 70–99)
Glucose-Capillary: 81 mg/dL (ref 70–99)
Glucose-Capillary: 87 mg/dL (ref 70–99)

## 2022-04-07 LAB — URINE CULTURE: Culture: >=100000 — AB

## 2022-04-07 LAB — BASIC METABOLIC PANEL
Anion gap: 2 — ABNORMAL LOW (ref 5–15)
BUN: 53 mg/dL — ABNORMAL HIGH (ref 8–23)
CO2: 27 mmol/L (ref 22–32)
Calcium: 9.6 mg/dL (ref 8.9–10.3)
Chloride: 110 mmol/L (ref 98–111)
Creatinine, Ser: 1.96 mg/dL — ABNORMAL HIGH (ref 0.44–1.00)
GFR, Estimated: 24 mL/min — ABNORMAL LOW (ref >60)
Glucose, Bld: 78 mg/dL (ref 70–99)
Potassium: 5.6 mmol/L — ABNORMAL HIGH (ref 3.5–5.1)
Sodium: 139 mmol/L (ref 135–145)

## 2022-04-07 LAB — POTASSIUM: Potassium: 5.3 mmol/L — ABNORMAL HIGH (ref 3.5–5.1)

## 2022-04-07 MED ORDER — LORAZEPAM 0.5 MG PO TABS
0.5000 mg | ORAL_TABLET | Freq: Once | ORAL | Status: DC
Start: 2022-04-07 — End: 2022-04-08
  Filled 2022-04-07: qty 1

## 2022-04-07 MED ORDER — SODIUM ZIRCONIUM CYCLOSILICATE 10 G PO PACK
10.0000 g | PACK | Freq: Once | ORAL | Status: AC
  Administered 2022-04-07: 10 g via ORAL
  Filled 2022-04-07 (×2): qty 1

## 2022-04-07 NOTE — Progress Notes (Signed)
Daily Progress Note   Patient Name: [name] [name]       Date: 04/07/2022 DOB: [birth year]  Age: 88 y.o. MRN#: [id number] Attending Physician: Shawna Clamp, MD Primary Care Physician: Pcp, No Admit Date: 04/04/2022  Reason for Consultation/Follow-up: {Reason for Consult:23484}  Subjective: ***  Length of Stay: 2   Physical Exam          Vital Signs: BP (!) 157/95 (BP Location: Right Arm)   Pulse 91   Temp 97.8 F (36.6 C) (Axillary)   Resp 17   Ht 6' (1.829 m)   Wt 100.3 kg   SpO2 98%   BMI 29.99 kg/m  SpO2: SpO2: 98 % O2 Device: O2 Device: Room Air O2 Flow Rate: O2 Flow Rate (L/min): 1 L/min      Palliative Assessment/Data:       Palliative Care Assessment & Plan   Patient Profile: Per intake H&P --> [name] [name], is a 88 y.o. female, who presented to the [facility] ED with a chief complaint of AMS. They have a pertinent past medical history of DM2, HLD, HTN, CKD 4, dementia. While at [facility] SNF patient became bradycardic with heart rate in the 30s to 40s, EMS was called. She was found to be hypothermic in the emergency department with a temperature of 89.1 F, 59/39, WBC 5.3, lactic 2.2 > 3.7.  A code sepsis was called.  Blood cultures were ordered.  She was started on ceftriaxone and sent to the ICU for pressor support. She has been weaned off pressors and is now stable and transitioning to the post acute care unit.    Palliative care has been asked to get involved to further discuss goals of care.   Assessment: ***  Recommendations/Plan: ***   Prognosis:  {Palliative Care Prognosis:23504}  Discharge Planning: {Palliative dispostion:23505}  Care plan was discussed with ***   Total time: I spent *** minutes in the care of the patient today in the above  activities and documenting the encounter.  MDM ***         Norberta Keens, PA-C  Palliative Medicine Team Team phone # [phone]  Thank you for allowing the Palliative Medicine Team to assist in the care of this patient. Please utilize secure chat with additional questions, if there is no response within 30 minutes please call  the above phone number.  Palliative Medicine Team providers are available by phone from 7am to 7pm daily and can be reached through the team cell phone.  Should this patient require assistance outside of these hours, please call the patient's attending physician.

## 2022-04-07 NOTE — Progress Notes (Signed)
Pt is alert and oriented to self and place.  Per shift report she was agitated during the day and refusing medications.  Pt continues to refuse vital signs, assessments and medication.  She states she feels better and needs to go home.  Pt states her grand daughter is keeping her away from her home.  Offered patient food and beverage, pt also declined.  Notified Dr. Hal Hope of pt refusal and will continue attempts to reassess and administer medications.
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
## 2022-04-07 NOTE — Progress Notes (Addendum)
PROGRESS NOTE    [name] [name]  JQB:[id number] DOB: [birth year] DOA: 04/04/2022  PCP: Pcp, No    Brief Narrative:  This 88 years old female with PMH significant for type 2 diabetes, hyperlipidemia, hypertension, CKD stage IV, dementia presented in the ED with complaints of altered mentation.  Patient was sent from [facility] skilled nursing facility.  Patient became bradycardic with a heart rate of 30s to 40s.  she was found to be hypothermic with a temp of 89.1 F, blood pressure 59/39,  lactic acid 3.7.  Patient was admitted for septic shock, initially intubated and admitted in ICU.  She subsequently extubated and started on IV antibiotics,  found to have UTI.  Hypothermia,  lactic acidosis and septic shock resolved.  Patient is on antibiotics.  PCCM pickup 04/07/2022.  Assessment & Plan:   Principal Problem:   Septic shock ([place])  Septic shock secondary to UTI; Patient presented with bradycardia, tachypnea, hypothermia, hypotension, lactic acid 3.7. UA positive for UTI Continue IV antibiotics. Sepsis physiology has resolved. Lactic acidosis resolved.  Hypothermia resolved. Complete course of antibiotics (ceftriaxone for 7 days)  Acute metabolic encephalopathy: Likely secondary to UTI.  Continue supportive care. Back to baseline mental status.  Hyperkalemia: Potassium 6.3, patient was given D50, albuterol, insulin, Lokelma. K 5.6 today.  Lokelma given . Recheck potassium.  Bradycardia > Resolved. Normal sinus rhythm on exam.  Last echocardiogram shows LVEF 50 to 55%. Continue to monitor  Thrombocytopenia: Likely secondary to sepsis.  Continue supportive care No signs of bleeding.  Diabetes mellitus type 2: Continue regular insulin sliding scale.  Essential hypertension: Blood pressure remains low. will hold antihypertensive medications.  AKI on CKD stage IV: Serum creatinine improving.  No indication for dialysis. Avoid nephrotoxic medications.  DNR: Continue ongoing  goals of care discussions.  Generalized weakness: Could be secondary to sepsis.  PT and OT evaluation  DVT prophylaxis: heparin sq Code Status: DNR Family Communication: No family at bed side. Disposition Plan:  Status is: Inpatient Remains inpatient appropriate because: Admitted for septic shock secondary to UTI and altered mentation,  intubated and then subsequently extubated.  Patient is on antibiotics.    Consultants:  PCCM Cardiology  Procedures:  Intubated /Extubated.  Antimicrobials:  Anti-infectives (From admission, onward)    Start     Dose/Rate Route Frequency Ordered Stop   04/05/22 0145  cefTRIAXone (ROCEPHIN) 2 g in sodium chloride 0.9 % 100 mL IVPB        2 g 200 mL/hr over 30 Minutes Intravenous Every 24 hours 04/05/22 0144 04/11/22 2359        Subjective: Patient was seen and examined at bedside.  Overnight events noted. Patient reports feeling better, wants to go home.  She appears confused but comfortable.  Objective: Vitals:   04/07/22 0010 04/07/22 0420 04/07/22 0716 04/07/22 1142  BP: 134/71 (!) 147/95 (!) 143/67 (!) 157/95  Pulse: 60 68 65 91  Resp: 19 19 19 17   Temp: 97.6 F (36.4 C) 97.6 F (36.4 C) 97.9 F (36.6 C) 97.8 F (36.6 C)  TempSrc: Oral Axillary Axillary Axillary  SpO2: 97% 99% 99% 98%  Weight:  100.3 kg    Height:        Intake/Output Summary (Last 24 hours) at 04/07/2022 1353 Last data filed at 04/07/2022 0848 Gross per 24 hour  Intake 1240 ml  Output 1450 ml  Net -210 ml   Filed Weights   04/05/22 0855 04/06/22 0417 04/07/22 0420  Weight: 94 kg 97.3 kg  100.3 kg    Examination:  General exam: Appears comfortable, not in any acute distress.  Deconditioned Respiratory system: CTA bilaterally, no wheezing, no crackles, normal respiratory effort. Cardiovascular system: S1 & S2 heard, regular rate and rhythm, no murmur. Gastrointestinal system: Abdomen is soft, non tender, non distended, BS+ Central nervous system:  Alert and oriented x 2 . No focal neurological deficits. Extremities: No edema, no cyanosis, no clubbing. Skin: No rashes, lesions or ulcers Psychiatry: Mood and affect appropriate    Data Reviewed: I have personally reviewed following labs and imaging studies  CBC: Recent Labs  Lab 04/04/22 2318 04/05/22 0725 04/06/22 0505  WBC 5.3 5.1 5.6  NEUTROABS 1.9  --   --   HGB 11.8* 9.6* 9.9*  HCT 38.7 31.0* 31.5*  MCV 100.8* 99.0 99.7  PLT 81* 89* 96*   Basic Metabolic Panel: Recent Labs  Lab 04/04/22 2318 04/05/22 0725 04/06/22 0505 04/06/22 1355 04/07/22 0634  NA 140 135 141 139 139  K 6.3* 5.2* 6.7* 6.0* 5.6*  CL 109 104 111 110 110  CO2 21* 21* 23 23 27   GLUCOSE 109* 81 89 83 78  BUN 84* 74* 65* 64* 53*  CREATININE 2.58* 2.40* 2.18* 2.04* 1.96*  CALCIUM 9.8 8.9 9.3 9.5 9.6  MG 2.1  --  1.8  --   --   PHOS  --   --  4.0  --   --    GFR: Estimated Creatinine Clearance: 25.3 mL/min (A) (by C-G formula based on SCr of 1.96 mg/dL (H)). Liver Function Tests: Recent Labs  Lab 04/04/22 2318  AST 23  ALT 28  ALKPHOS 65  BILITOT 0.4  PROT 7.1  ALBUMIN 3.4*   No results for input(s): LIPASE, AMYLASE in the last 168 hours. No results for input(s): AMMONIA in the last 168 hours. Coagulation Profile: Recent Labs  Lab 04/05/22 0015  INR 1.1   Cardiac Enzymes: No results for input(s): CKTOTAL, CKMB, CKMBINDEX, TROPONINI in the last 168 hours. BNP (last 3 results) No results for input(s): PROBNP in the last 8760 hours. HbA1C: Recent Labs    04/05/22 0725  HGBA1C 5.7*   CBG: Recent Labs  Lab 04/06/22 2034 04/07/22 0009 04/07/22 0415 04/07/22 0715 04/07/22 1141  GLUCAP 114* 100* 81 87 140*   Lipid Profile: No results for input(s): CHOL, HDL, LDLCALC, TRIG, CHOLHDL, LDLDIRECT in the last 72 hours. Thyroid Function Tests: No results for input(s): TSH, T4TOTAL, FREET4, T3FREE, THYROIDAB in the last 72 hours. Anemia Panel: No results for input(s):  VITAMINB12, FOLATE, FERRITIN, TIBC, IRON, RETICCTPCT in the last 72 hours. Sepsis Labs: Recent Labs  Lab 04/04/22 2318 04/05/22 0251 04/05/22 0725  PROCALCITON  --   --  0.30  LATICACIDVEN 2.2* 3.7* 2.0*    Recent Results (from the past 240 hour(s))  Culture, blood (routine x 2)     Status: None (Preliminary result)   Collection Time: 04/04/22 12:15 AM   Specimen: BLOOD RIGHT HAND  Result Value Ref Range Status   Specimen Description BLOOD RIGHT HAND  Final   Special Requests   Final    BOTTLES DRAWN AEROBIC AND ANAEROBIC Blood Culture results may not be optimal due to an inadequate volume of blood received in culture bottles   Culture   Final    NO GROWTH 2 DAYS Performed at [facility], [place] [address], [place], [place] [address]    Report Status PENDING  Incomplete  Urine Culture     Status: Abnormal  Collection Time: 04/04/22 11:54 PM   Specimen: In/Out Cath Urine  Result Value Ref Range Status   Specimen Description IN/OUT CATH URINE  Final   Special Requests   Final    NONE Performed at [facility], [address], [place], [place] [address]    Culture >=100,000 COLONIES/mL CITROBACTER FREUNDII (A)  Final   Report Status 04/07/2022 FINAL  Final   Organism ID, Bacteria CITROBACTER FREUNDII (A)  Final      Susceptibility   Citrobacter freundii - MIC*    CEFAZOLIN >=64 RESISTANT Resistant     CEFEPIME <=0.12 SENSITIVE Sensitive     CEFTRIAXONE <=0.25 SENSITIVE Sensitive     CIPROFLOXACIN <=0.25 SENSITIVE Sensitive     GENTAMICIN <=1 SENSITIVE Sensitive     IMIPENEM 0.5 SENSITIVE Sensitive     NITROFURANTOIN <=16 SENSITIVE Sensitive     TRIMETH/SULFA <=20 SENSITIVE Sensitive     PIP/TAZO <=4 SENSITIVE Sensitive     * >=100,000 COLONIES/mL CITROBACTER FREUNDII  Culture, blood (routine x 2)     Status: None (Preliminary result)   Collection Time: 04/05/22 12:37 AM   Specimen: BLOOD RIGHT ARM  Result Value Ref Range Status   Specimen Description BLOOD  RIGHT ARM  Final   Special Requests   Final    BOTTLES DRAWN AEROBIC AND ANAEROBIC Blood Culture adequate volume   Culture   Final    NO GROWTH 2 DAYS Performed at [facility], [address] [place], [place] [address]    Report Status PENDING  Incomplete  MRSA Next Gen by PCR, Nasal     Status: None   Collection Time: 04/05/22  9:00 AM   Specimen: Nasal Mucosa; Nasal Swab  Result Value Ref Range Status   MRSA by PCR Next Gen NOT DETECTED NOT DETECTED Final    Comment: (NOTE) The GeneXpert MRSA Assay (FDA approved for NASAL specimens only), is one component of a comprehensive MRSA colonization surveillance program. It is not intended to diagnose MRSA infection nor to guide or monitor treatment for MRSA infections. Test performance is not FDA approved in patients less than 23 years old. Performed at [facility], [place] [address] [place], [place] [address]     Radiology Studies: No results found.  Scheduled Meds:  chlorhexidine  15 mL Mouth Rinse BID   heparin  5,000 Units Subcutaneous Q8H   insulin aspart  0-15 Units Subcutaneous Q4H   mouth rinse  15 mL Mouth Rinse q12n4p   pantoprazole (PROTONIX) IV  40 mg Intravenous QHS   QUEtiapine  25 mg Oral Daily   Continuous Infusions:  sodium chloride 10 mL/hr at 04/06/22 1000   cefTRIAXone (ROCEPHIN)  IV Stopped (04/06/22 2336)     LOS: 2 days    Time spent: 50 mins    Yeraldy Spike, MD [facility]   If 7PM-7AM, please contact night-coverage

## 2022-04-08 DIAGNOSIS — A419 Sepsis, unspecified organism: Secondary | ICD-10-CM | POA: Diagnosis not present

## 2022-04-08 DIAGNOSIS — R6521 Severe sepsis with septic shock: Secondary | ICD-10-CM | POA: Diagnosis not present

## 2022-04-08 LAB — GLUCOSE, CAPILLARY
Glucose-Capillary: 65 mg/dL — ABNORMAL LOW (ref 70–99)
Glucose-Capillary: 67 mg/dL — ABNORMAL LOW (ref 70–99)
Glucose-Capillary: 126 mg/dL — ABNORMAL HIGH (ref 70–99)
Glucose-Capillary: 94 mg/dL (ref 70–99)
Glucose-Capillary: 83 mg/dL (ref 70–99)

## 2022-04-08 MED ORDER — PANTOPRAZOLE SODIUM 40 MG PO TBEC
40.0000 mg | DELAYED_RELEASE_TABLET | Freq: Every day | ORAL | Status: DC
  Administered 2022-04-09 – 2022-04-18 (×7): 40 mg via ORAL
  Filled 2022-04-08 (×8): qty 1

## 2022-04-08 MED ORDER — DEXTROSE 50 % IV SOLN
1.0000 | Freq: Once | INTRAVENOUS | Status: AC
  Administered 2022-04-11: 50 mL via INTRAVENOUS
  Filled 2022-04-08: qty 50

## 2022-04-08 MED ORDER — LORAZEPAM 1 MG PO TABS
1.0000 mg | ORAL_TABLET | Freq: Two times a day (BID) | ORAL | Status: DC
  Administered 2022-04-11 – 2022-04-14 (×5): 1 mg via ORAL
  Filled 2022-04-08 (×7): qty 1

## 2022-04-08 MED ORDER — SODIUM ZIRCONIUM CYCLOSILICATE 10 G PO PACK
10.0000 g | PACK | Freq: Once | ORAL | Status: AC
Start: 2022-04-08 — End: 2022-04-08
  Administered 2022-04-08: 10 g via ORAL
  Filled 2022-04-08: qty 1

## 2022-04-08 MED ORDER — LORAZEPAM 2 MG/ML IJ SOLN
1.0000 mg | Freq: Two times a day (BID) | INTRAMUSCULAR | Status: DC
  Administered 2022-04-08 – 2022-04-13 (×7): 1 mg via INTRAVENOUS
  Filled 2022-04-08 (×7): qty 1

## 2022-04-08 MED ORDER — SODIUM ZIRCONIUM CYCLOSILICATE 10 G PO PACK
10.0000 g | PACK | Freq: Once | ORAL | Status: DC
Start: 2022-04-08 — End: 2022-04-08

## 2022-04-08 MED ORDER — MELATONIN 3 MG PO TABS
3.0000 mg | ORAL_TABLET | Freq: Every day | ORAL | Status: DC
  Administered 2022-04-08 – 2022-04-17 (×6): 3 mg via ORAL
  Filled 2022-04-08 (×9): qty 1

## 2022-04-08 MED ORDER — DEXTROSE 50 % IV SOLN
INTRAVENOUS | Status: AC
  Administered 2022-04-08: 50 mL
  Filled 2022-04-08: qty 50

## 2022-04-08 NOTE — Progress Notes (Signed)
Daily Progress Note   Patient Name: [name] [name]       Date: 04/08/2022 DOB: 04-19-1930  Age: 88 y.o. MRN#: [id number] Attending Physician: Shawna Clamp, MD Primary Care Physician: Pcp, No Admit Date: 04/04/2022  Reason for Consultation/Follow-up: Establishing goals of care  Subjective: Medical records reviewed including progress notes, labs. Patient assessed at the bedside.  She is sleeping comfortably, did not attempt to arouse.  No family present during my visit.  Called patient's daughter [name] [name] upon receiving update from LCSW that she would like to speak with me.  She is hoping for further clarification of hospice after speaking with [name] [name], her daughter.  Reviewed hospice philosophy and available resources in detail, recommending consideration of this additional support and emphasis on quality of life.  I shared that patient has refused medications and interventions both during admission and previously at facility, indicating that this is causing her distress and suffering.  [name] [name] states that "you just want me to let her die" and I attempted to provide reassurance that patient would be supported with great care and attention as the natural disease process will determine how long she would survive.  She is very resistant to this, sharing that her uncle and brother both were enrolled in hospice and died very shortly after.  Explained that hospice patients are often enrolled too late and do not receive adequate care in their final months.  She is not agreeable to discontinuing any life-prolonging interventions at this time.  Outpatient palliative care referral was explained and offered.  She is open to this and consents to referral being placed.  Questions and concerns addressed. PMT will continue  to support holistically.   Length of Stay: 3   Physical Exam Vitals and nursing note reviewed.  Constitutional:      General: She is not in acute distress. Cardiovascular:     Rate and Rhythm: Normal rate.  Pulmonary:     Effort: Pulmonary effort is normal.  Skin:    General: Skin is warm and dry.  Neurological:     Mental Status: She is alert. She is disoriented.  Psychiatric:        Behavior: Behavior is agitated.            Vital Signs: BP 130/61 (BP Location: Right Arm)   Pulse 62  Temp 98 F (36.7 C) (Axillary)   Resp 14   Ht 6' (1.829 m)   Wt 98.7 kg   SpO2 93%   BMI 29.51 kg/m  SpO2: SpO2: 93 % O2 Device: O2 Device: Room Air O2 Flow Rate: O2 Flow Rate (L/min): 1 L/min       Palliative Care Assessment & Plan   Patient Profile: Per intake H&P --> [name] [name], is a 88 y.o. female, who presented to the [facility] ED with a chief complaint of AMS. They have a pertinent past medical history of DM2, HLD, HTN, CKD 4, dementia. While at [facility] SNF patient became bradycardic with heart rate in the 30s to 40s, EMS was called. She was found to be hypothermic in the emergency department with a temperature of 89.1 F, 59/39, WBC 5.3, lactic 2.2 > 3.7.  A code sepsis was called.  Blood cultures were ordered.  She was started on ceftriaxone and sent to the ICU for pressor support. She has been weaned off pressors and is now stable and transitioning to the post acute care unit.    Palliative care has been asked to get involved to further discuss goals of care.   Assessment: Goals of care conversation Dementia  Recommendations/Plan: DNR Continue current care  Patient's daughter is not ready for hospice but agreeable to outpatient palliative care referral at SNF, [facility] assistance appreciated PMT remains available for acute needs.  Please secure chat or call team line should urgent needs arise  Prognosis:  Guarded   Discharge Planning: [place] for rehab with  Palliative care service follow-up   Care plan was discussed with patient, patient's daughter, LCSW  Total time: I spent 35 minutes in the care of the patient today in the above activities and documenting the encounter.   [name], PA-C Palliative Medicine Team Team phone # [phone]  Thank you for allowing the Palliative Medicine Team to assist in the care of this patient. Please utilize secure chat with additional questions, if there is no response within 30 minutes please call the above phone number.  Palliative Medicine Team providers are available by phone from 7am to 7pm daily and can be reached through the team cell phone.  Should this patient require assistance outside of these hours, please call the patient's attending physician.

## 2022-04-08 NOTE — Progress Notes (Signed)
Pt agreeable to letting us check her sugars. BG 67. MD aware.  1 amp of D50 ordered and given. Repeat BG 15 minutes later is 126.

## 2022-04-08 NOTE — Progress Notes (Signed)
PROGRESS NOTE    [name] [name]  POE:[id number] DOB: 1930/07/21 DOA: 04/04/2022  PCP: Pcp, No    Brief Narrative:  This 88 years old female with PMH significant for type 2 diabetes, hyperlipidemia, hypertension, CKD stage IV, dementia presented in the ED with complaints of altered mentation.  Patient was sent from [facility] skilled nursing facility.  Patient became bradycardic with a heart rate of 30s to 40s.  she was found to be hypothermic with a temp of 89.1 F, blood pressure 59/39,  lactic acid 3.7.  Patient was admitted for septic shock, initially intubated and admitted in ICU.  She subsequently extubated and started on IV antibiotics,  found to have UTI.  Hypothermia,  lactic acidosis and septic shock resolved.  Patient is on antibiotics.  PCCM pickup 04/07/2022.  Assessment & Plan:   Principal Problem:   Septic shock ([place])  Septic shock secondary to UTI; Patient presented with bradycardia, tachypnea, hypothermia, hypotension, lactic acid 3.7. UA positive for UTI Continue IV antibiotics. Sepsis physiology has resolved. Lactic acidosis resolved.  Hypothermia resolved. Complete course of antibiotics (ceftriaxone for 7 days) Last date 04/11/22.  Acute metabolic encephalopathy: Likely secondary to UTI.  Continue supportive care. Patient has in and out change in mental status likely delirium. Patient has hallucinations, anger outbursts. Continue Seroquel.  Psychiatry consulted for evaluation.  Hyperkalemia: Potassium 6.3, patient was given D50, albuterol, insulin, Lokelma. K 5.3 today.  Lokelma given . Recheck potassium.  Bradycardia > Resolved. Normal sinus rhythm on exam.  Last echocardiogram shows LVEF 50 to 55%. Continue to monitor  Thrombocytopenia: Likely secondary to sepsis.  Continue supportive care No signs of bleeding.  Diabetes mellitus type 2: Continue regular insulin sliding scale.  Essential hypertension: Blood pressure improving Resume antiHTN  medications.  AKI on CKD stage IV: Serum creatinine improving.  No indication for dialysis. Avoid nephrotoxic medications.  DNR: Continue ongoing goals of care discussions.  Generalized weakness: Could be secondary to sepsis.  PT and OT evaluation PT: SNF  DVT prophylaxis: heparin sq Code Status: DNR Family Communication: No family at bed side. Disposition Plan:  Status is: Inpatient Remains inpatient appropriate because:  Admitted for septic shock secondary to UTI and altered mentation,  intubated and then subsequently extubated.  Patient is on antibiotics.    Consultants:  PCCM Cardiology  Procedures:  Intubated /Extubated.  Antimicrobials:  Anti-infectives (From admission, onward)    Start     Dose/Rate Route Frequency Ordered Stop   04/05/22 0145  cefTRIAXone (ROCEPHIN) 2 g in sodium chloride 0.9 % 100 mL IVPB        2 g 200 mL/hr over 30 Minutes Intravenous Every 24 hours 04/05/22 0144 04/11/22 2359        Subjective: Patient was seen and examined at bedside. Overnight events noted. Patient reports feeling better,  states she wants to go home.  She appears confused and hallucinating. She is comfortable,  denies any pain.   Objective: Vitals:   04/08/22 0330 04/08/22 0731 04/08/22 0911 04/08/22 1127  BP:  (!) 147/69 134/68 130/61  Pulse:  68 (!) 59 62  Resp:  20 20 14   Temp:  97.8 F (36.6 C)  98 F (36.7 C)  TempSrc:  Oral  Axillary  SpO2:  98% 96% 93%  Weight: 98.7 kg     Height:        Intake/Output Summary (Last 24 hours) at 04/08/2022 1350 Last data filed at 04/08/2022 0323 Gross per 24 hour  Intake 580 ml  Output --  Net 580 ml   Filed Weights   04/06/22 0417 04/07/22 0420 04/08/22 0330  Weight: 97.3 kg 100.3 kg 98.7 kg    Examination:  General exam: Appears comfortable, not in any acute distress.  Deconditioned Respiratory system: CTA bilaterally, no wheezing, normal respiratory effort.  RR 15 Cardiovascular system: S1-S2 heard,  regular rate and rhythm, no murmur. Gastrointestinal system: Abdomen is soft, non tender, non distended, BS+ Central nervous system: Alert and oriented x 2 . No focal neurological deficits. Extremities: No edema, no cyanosis, no clubbing. Skin: No rashes, lesions or ulcers Psychiatry: Mood and affect appropriate.    Data Reviewed: I have personally reviewed following labs and imaging studies  CBC: Recent Labs  Lab 04/04/22 2318 04/05/22 0725 04/06/22 0505  WBC 5.3 5.1 5.6  NEUTROABS 1.9  --   --   HGB 11.8* 9.6* 9.9*  HCT 38.7 31.0* 31.5*  MCV 100.8* 99.0 99.7  PLT 81* 89* 96*   Basic Metabolic Panel: Recent Labs  Lab 04/04/22 2318 04/05/22 0725 04/06/22 0505 04/06/22 1355 04/07/22 0634 04/07/22 1402  NA 140 135 141 139 139  --   K 6.3* 5.2* 6.7* 6.0* 5.6* 5.3*  CL 109 104 111 110 110  --   CO2 21* 21* 23 23 27   --   GLUCOSE 109* 81 89 83 78  --   BUN 84* 74* 65* 64* 53*  --   CREATININE 2.58* 2.40* 2.18* 2.04* 1.96*  --   CALCIUM 9.8 8.9 9.3 9.5 9.6  --   MG 2.1  --  1.8  --   --   --   PHOS  --   --  4.0  --   --   --    GFR: Estimated Creatinine Clearance: 25.1 mL/min (A) (by C-G formula based on SCr of 1.96 mg/dL (H)). Liver Function Tests: Recent Labs  Lab 04/04/22 2318  AST 23  ALT 28  ALKPHOS 65  BILITOT 0.4  PROT 7.1  ALBUMIN 3.4*   No results for input(s): LIPASE, AMYLASE in the last 168 hours. No results for input(s): AMMONIA in the last 168 hours. Coagulation Profile: Recent Labs  Lab 04/05/22 0015  INR 1.1   Cardiac Enzymes: No results for input(s): CKTOTAL, CKMB, CKMBINDEX, TROPONINI in the last 168 hours. BNP (last 3 results) No results for input(s): PROBNP in the last 8760 hours. HbA1C: No results for input(s): HGBA1C in the last 72 hours.  CBG: Recent Labs  Lab 04/07/22 1141 04/07/22 1628 04/08/22 0314 04/08/22 1129 04/08/22 1157  GLUCAP 140* 80 65* 67* 126*   Lipid Profile: No results for input(s): CHOL, HDL, LDLCALC,  TRIG, CHOLHDL, LDLDIRECT in the last 72 hours. Thyroid Function Tests: No results for input(s): TSH, T4TOTAL, FREET4, T3FREE, THYROIDAB in the last 72 hours. Anemia Panel: No results for input(s): VITAMINB12, FOLATE, FERRITIN, TIBC, IRON, RETICCTPCT in the last 72 hours. Sepsis Labs: Recent Labs  Lab 04/04/22 2318 04/05/22 0251 04/05/22 0725  PROCALCITON  --   --  0.30  LATICACIDVEN 2.2* 3.7* 2.0*    Recent Results (from the past 240 hour(s))  Culture, blood (routine x 2)     Status: None (Preliminary result)   Collection Time: 04/04/22 12:15 AM   Specimen: BLOOD RIGHT HAND  Result Value Ref Range Status   Specimen Description BLOOD RIGHT HAND  Final   Special Requests   Final    BOTTLES DRAWN AEROBIC AND ANAEROBIC Blood Culture results may not be optimal due to  an inadequate volume of blood received in culture bottles   Culture   Final    NO GROWTH 3 DAYS Performed at [facility], [place] [address] [place], [place] [address]    Report Status PENDING  Incomplete  Urine Culture     Status: Abnormal   Collection Time: 04/04/22 11:54 PM   Specimen: In/Out Cath Urine  Result Value Ref Range Status   Specimen Description IN/OUT CATH URINE  Final   Special Requests   Final    NONE Performed at [facility], [place] [address], [place], [place] [address]    Culture >=100,000 COLONIES/mL CITROBACTER FREUNDII (A)  Final   Report Status 04/07/2022 FINAL  Final   Organism ID, Bacteria CITROBACTER FREUNDII (A)  Final      Susceptibility   Citrobacter freundii - MIC*    CEFAZOLIN >=64 RESISTANT Resistant     CEFEPIME <=0.12 SENSITIVE Sensitive     CEFTRIAXONE <=0.25 SENSITIVE Sensitive     CIPROFLOXACIN <=0.25 SENSITIVE Sensitive     GENTAMICIN <=1 SENSITIVE Sensitive     IMIPENEM 0.5 SENSITIVE Sensitive     NITROFURANTOIN <=16 SENSITIVE Sensitive     TRIMETH/SULFA <=20 SENSITIVE Sensitive     PIP/TAZO <=4 SENSITIVE Sensitive     * >=100,000 COLONIES/mL CITROBACTER  FREUNDII  Culture, blood (routine x 2)     Status: None (Preliminary result)   Collection Time: 04/05/22 12:37 AM   Specimen: BLOOD RIGHT ARM  Result Value Ref Range Status   Specimen Description BLOOD RIGHT ARM  Final   Special Requests   Final    BOTTLES DRAWN AEROBIC AND ANAEROBIC Blood Culture adequate volume   Culture   Final    NO GROWTH 3 DAYS Performed at [facility], [address] [place], [place] [address]    Report Status PENDING  Incomplete  MRSA Next Gen by PCR, Nasal     Status: None   Collection Time: 04/05/22  9:00 AM   Specimen: Nasal Mucosa; Nasal Swab  Result Value Ref Range Status   MRSA by PCR Next Gen NOT DETECTED NOT DETECTED Final    Comment: (NOTE) The GeneXpert MRSA Assay (FDA approved for NASAL specimens only), is one component of a comprehensive MRSA colonization surveillance program. It is not intended to diagnose MRSA infection nor to guide or monitor treatment for MRSA infections. Test performance is not FDA approved in patients less than 20 years old. Performed at [facility], [place] [address], [place], [place] [address]     Radiology Studies: No results found.  Scheduled Meds:  chlorhexidine  15 mL Mouth Rinse BID   dextrose  1 ampule Intravenous Once   heparin  5,000 Units Subcutaneous Q8H   insulin aspart  0-15 Units Subcutaneous Q4H   LORazepam  0.5 mg Oral Once   mouth rinse  15 mL Mouth Rinse q12n4p   pantoprazole  40 mg Oral Daily   QUEtiapine  25 mg Oral Daily   sodium zirconium cyclosilicate  10 g Oral Once   Continuous Infusions:  sodium chloride 10 mL/hr at 04/06/22 1000   cefTRIAXone (ROCEPHIN)  IV 2 g (04/08/22 0323)     LOS: 3 days    Time spent: 35 mins   Oneta Sigman, MD [facility]   If 7PM-7AM, please contact night-coverage

## 2022-04-08 NOTE — Consult Note (Cosign Needed Addendum)
[facility] New Face-to-Face Psychiatric Evaluation   Service Date: Apr 08, 2022 LOS:  LOS: 3 days    Assessment  [name] [name] is a 88 y.o. female admitted medically for 04/04/2022 11:07 PM for septic shock and acute metabolic encephalopathy in the setting of UTI and dementia. Psychiatry was consulted for patient's hallucinations and confusion by Dr. Shawna Clamp.   At this time, the patient's presentation of altered mental status and disorientation is most consistent with delirium, most likely due to multiple etiologies including but not limited to patient's UTI, medications, altered sleep/wake cycle, and limited mobility. The patient would strongly benefit from medical treatment of UTI and management of insomnia. During this time period, minimization of delirogenic insults will be of utmost importance; this includes promoting the normal circadian cycle, minimizing lines/tubes, avoiding deliriogenic medications such as benzodiazepines and anticholinergic medications, and frequently reorienting the patient. Symptomatic treatment for agitation can be provided by antipsychotic medications, though it is important to remember that these do not treat the underlying etiology of delirium. Notably, there can be a time lag effect between treatment of a medical problem and resolution of delirium. This time lag effect may be of longer duration in the elderly, and those with underlying cognitive impairment such as this patient's dementia.  Diagnoses:  Active Hospital problems: Principal Problem:   Septic shock ([place])     Plan  ## Safety and Observation Level:  - Based on my clinical evaluation, I estimate the patient to be at moderate risk of self harm in the current setting - At this time, we recommend a frequent level of observation with delirium, elopement and fall precautions. This decision is based on my review of the chart including patient's history and current presentation, interview  of the patient, mental status examination, and consideration of suicide risk including evaluating suicidal ideation, plan, intent, suicidal or self-harm behaviors, risk factors, and protective factors. This judgment is based on our ability to directly address suicide risk, implement suicide prevention strategies and develop a safety plan while the patient is in the clinical setting. Please contact our team if there is a concern that risk level has changed.  ## Medications:  -- Start low-dose Ativan 1 mg, oral, BID for anxiety and insomnia. -- Back-up plan: IV Ativan 1 mg, BID if patient does not tolerate oral. -- Melatonin 3 mg, oral, at bedtime for insomnia.  ## Management: -- CIWA q8h  ## Medical Decision Making Capacity:  Not formally assessed  ## Further Work-up:  -- most recent EKG on 5/26 had QtC of 404  ## Disposition:  -- Patient can return to her SNF once medically clear.  Thank you for this consult request. Recommendations have been communicated to the primary team.  We will continue to follow at this time.  [name], Medical Student   NEW history  Relevant Aspects of Hospital Course:  Admitted on 04/04/2022 for acute toxic metabolic encephalopathy in the setting of dementia and UTI.  Patient Report:  Patient is oriented to person, place, and month but mentioned that the year is 28. Patient could not recall 3 object words after 5 minutes. Patient does not know who the current president is. Patient's speech is slow and occasionally garbled.  Patient does not comprehend reason for her hospitalization and continually expressed that she wants to go home - expressed belief that she is hospitalized because of her daughter and/or granddaughter who want her to remain hospitalized permanently. Reassured patient that she was in the  hospital to be treated for her infection and needed to remain hospitalized for the time being. Patient had perseveration on conflict with  her daughter and/or granddaughter and upcoming legal proceedings/investigations of her daughter/granddaughter. Patient's explanation of this was difficult to understand given her altered mental status, and it was difficult to comprehend patient's speech at times. Suspect patient could have been recounting previous experience of being placed into SNF, but this is unclear. Patient also kept referring to medical student interviewing her as [name].  Patient is agreeable to plan for medical team to help with her sleep during her hospital stay with melatonin and she is agreeable to taking Ativan.  ROS:  Patient reports no SI, HI, and nightmares today or during this hospitalization. Patient reports no AVH currently but seemed to be hinting at [place] earlier in her hospital stay, but her elaboration on this was unclear.   Patient reports difficulty sleeping during hospital stay - she reports 3 hours of sleep last night with difficulty falling asleep and waking up multiple times throughout the night.  Collateral information:  Patient's nurse reported that overnight patient was talking to her brother who was not present in the room and was responding as her brother as well.  2. Was able to contact patient's care team at her SNF and spoke with staff member who takes care of her: [facility] .   Service: Skilled Nursing Contact information: [place] [phone]  SNF reported the past 2 weeks patient has becoming increasingly combative, including spitting on people, tripping staff, and making bizarre statements such as she "wants to go back to her mansion". SNF reported that she was somber when she first arrived to facility and would have occasional agitated episodes but not as frequent. SNF reported that patient's speech is usually clear. SNF reported that patient usually ambulates via wheelchair.  Psychiatric History:  Information collected from  patient - Patient reports no previous psychiatric hospitalizations, diagnoses, or medications in the past.  From chart: patient has hx of MDD and previous SI.  Family psych history: Unable to obtain  Social History:  Unable to obtain  Family History:  The patient's family history is not on file.  Medical History: Past Medical History:  Diagnosis Date   Diabetes mellitus    Hyperlipidemia    Hypertension     Surgical History: Past Surgical History:  Procedure Laterality Date   APPENDECTOMY     CHOLECYSTECTOMY     COLONOSCOPY  06/23/2012   Procedure: COLONOSCOPY;  Surgeon: Lear Ng, MD;  Location: [facility];  Service: Endoscopy;  Laterality: N/A;   ESOPHAGOGASTRODUODENOSCOPY  06/23/2012   Procedure: ESOPHAGOGASTRODUODENOSCOPY (EGD);  Surgeon: Lear Ng, MD;  Location: [facility] ENDOSCOPY;  Service: Endoscopy;  Laterality: N/A;    Medications:   Current Facility-Administered Medications:    0.9 %  sodium chloride infusion, 250 mL, Intravenous, Continuous, Estill Cotta, NP, Last Rate: 10 mL/hr at 04/06/22 1000, Infusion Verify at 04/06/22 1000   acetaminophen (TYLENOL) tablet 650 mg, 650 mg, Oral, Q6H PRN, Elsie Lincoln, MD, 650 mg at 04/06/22 2349   cefTRIAXone (ROCEPHIN) 2 g in sodium chloride 0.9 % 100 mL IVPB, 2 g, Intravenous, W80H, Delora Fuel, MD, Last Rate: 200 mL/hr at 04/08/22 0323, 2 g at 04/08/22 0323   chlorhexidine (PERIDEX) 0.12 % solution 15 mL, 15 mL, Mouth Rinse, BID, Candee Furbish, MD, 15 mL at 04/07/22 0834   dextrose 50 % solution 50 mL,  1 ampule, Intravenous, Once, Shawna Clamp, MD   docusate sodium (COLACE) capsule 100 mg, 100 mg, Oral, BID PRN, Estill Cotta, NP   heparin injection 5,000 Units, 5,000 Units, Subcutaneous, Q8H, Estill Cotta, NP, 5,000 Units at 04/06/22 2256   insulin aspart (novoLOG) injection 0-15 Units, 0-15 Units, Subcutaneous, Q4H, Estill Cotta, NP, 2 Units at 04/07/22 1213   LORazepam (ATIVAN)  tablet 0.5 mg, 0.5 mg, Oral, Once, Shawna Clamp, MD   MEDLINE mouth rinse, 15 mL, Mouth Rinse, q12n4p, Candee Furbish, MD, 15 mL at 04/06/22 1143   pantoprazole (PROTONIX) EC tablet 40 mg, 40 mg, Oral, Daily, Shawna Clamp, MD   polyethylene glycol (MIRALAX / GLYCOLAX) packet 17 g, 17 g, Oral, Daily PRN, Estill Cotta, NP   QUEtiapine (SEROQUEL) tablet 25 mg, 25 mg, Oral, Daily, Rosezella Rumpf, NP, 25 mg at 04/06/22 2310  Allergies: Allergies  Allergen Reactions   Alendronate     Other reaction(s): Other (See Comments) Poor renal function.       Objective  Vital signs:  Temp:  [97.8 F (36.6 C)-98 F (36.7 C)] 98 F (36.7 C) (05/30 1127) Pulse Rate:  [59-114] 62 (05/30 1127) Resp:  [14-20] 14 (05/30 1127) BP: (130-147)/(61-88) 130/61 (05/30 1127) SpO2:  [93 %-100 %] 93 % (05/30 1127) Weight:  [98.7 kg] 98.7 kg (05/30 0330)  Psychiatric Specialty Exam:  Presentation  General Appearance: Appropriate for Environment Eye Contact:Fair Speech:Garbled; Slow Speech Volume:Decreased Handedness:No data recorded  Mood and Affect  Mood:Anxious Affect:Appropriate  Thought Process  Thought Processes:Disorganized Descriptions of Associations:Tangential  Orientation:Partial (Oriented to person and place)  Thought Content:Perseveration; Tangential  History of Schizophrenia/Schizoaffective disorder:No data recorded Duration of Psychotic Symptoms:No data recorded Hallucinations:Hallucinations: None (None during exam, but medical team reported hallucinations and confusion overnight)  Ideas of Reference:None  Suicidal Thoughts:Suicidal Thoughts: No  Homicidal Thoughts:Homicidal Thoughts: No   Sensorium  Memory:Immediate Poor; Recent Poor; Remote Poor Judgment:Impaired Insight:Poor  Executive Functions  Concentration:Poor Attention Span:Fair [place]  Psychomotor Activity  Psychomotor Activity:Psychomotor  Activity: Normal  Assets  Assets:Housing  Sleep  Sleep:Sleep: Poor (Reports 3 hours last night)   Physical Exam: Physical Exam Constitutional:      General: She is not in acute distress. HENT:     Head: Normocephalic.  Pulmonary:     Effort: Pulmonary effort is normal.  Skin:    General: Skin is warm and dry.  Neurological:     Mental Status: She is disoriented and confused.   Review of Systems  Constitutional:  Negative for fever.  Respiratory:  Negative for shortness of breath.   Psychiatric/Behavioral:  Positive for memory loss. The patient has insomnia.    Blood pressure 130/61, pulse 62, temperature 98 F (36.7 C), temperature source Axillary, resp. rate 14, height 6' (1.829 m), weight 98.7 kg, SpO2 93 %. Body mass index is 29.51 kg/m.  EKG: Sinus or ectopic atrial bradycardia Prolonged PR interval When compared with ECG of 10/13/2021, HEART RATE has decreased Premature atrial complexes are no longer present Confirmed by Delora Fuel ([id number]) on 04/04/2022 11:10:51 PM, HR 34, QTcB 404  Date/Time:  Friday Apr 04 2022 23:07:45 EDT Ventricular Rate:  34 PR Interval:  358 QRS Duration: 134 QT Interval:  537 QTC Calculation: 404 R Axis:   2 Text Interpretation: Sinus or ectopic atrial bradycardia Prolonged PR interval When compared with ECG of 10/13/2021, HEART RATE has decreased Premature atrial complexes are no longer present Confirmed by Delora Fuel ([id number])  on 04/04/2022 11:10:51 PM     Na/K/Cl/CO2:  --/5.3/--/-- (05/29 1402)       Component Value Date/Time   NA 139 04/07/2022 0634   K 5.3 (H) 04/07/2022 1402   CREATININE 1.96 (H) 04/07/2022 0634   BUN 53 (H) 04/07/2022 0634   HGB 9.9 (L) 04/06/2022 0505   MCV 99.7 04/06/2022 0505   WBC 5.6 04/06/2022 0505   PLT 96 (L) 04/06/2022 0505   INR 1.1 04/05/2022 0015      Component Value Date/Time   AST 23 04/04/2022 2318   ALT 28 04/04/2022 2318   ALKPHOS 65 04/04/2022 2318      Component Value Date/Time    BILITOT 0.4 04/04/2022 2318   BILIDIR 0.1 09/26/2021 1608   IBILI 0.9 09/26/2021 1608      Component Value Date/Time   IRON 46 09/11/2021 0620   TIBC 225 (L) 09/11/2021 0620   FERRITIN 205 12/28/2014 1617   IRONPCTSAT 20 09/11/2021 0620       Component Value Date/Time   TSH 1.971 10/01/2021 1605   TSH 1.992 12/28/2014 1559      Component Value Date/Time   VITAMINB12 247 10/01/2021 1605   FOLATE 12.8 07/18/2021 [place], Medical Student 04/08/2022, 4:36 PM

## 2022-04-08 NOTE — Progress Notes (Signed)
Manufacturing engineer [facility]) Hospital Liaison Note  Manufacturing engineer [facility]) Hospital Liaison Note  Notified by [facility] manager of patient/family request for [facility] palliative services at [facility] after discharge.   [facility] hospital liaison will follow patient for discharge disposition.   Please call with any hospice or outpatient palliative care related questions.   Thank you for the opportunity to participate in this patient's care.   [facility] Liaison [phone]

## 2022-04-08 NOTE — Progress Notes (Signed)
Pt confused this morning and refusing care. Pt verbally aggressive to [facility] and Therapist, sports. Pt started singing to [employer] TechnologiesNoooo b...., youuuu can't check my sugar" then got more aggressive and shouted "get out of my room". MD aware. Psych consulted. Door open for safety. SB on telemetry. Call bell placed within reach. Will continue to monitor and maintain safety.

## 2022-04-08 NOTE — Progress Notes (Signed)
Inpatient Diabetes Program Recommendations  AACE/ADA: New Consensus Statement on Inpatient Glycemic Control (2015)  Target Ranges:  Prepandial:   less than 140 mg/dL      Peak postprandial:   less than 180 mg/dL (1-2 hours)      Critically ill patients:  140 - 180 mg/dL   Lab Results  Component Value Date   GLUCAP 67 (L) 04/08/2022   HGBA1C 5.7 (H) 04/05/2022    Review of Glycemic Control  Latest Reference Range & Units 04/06/22 07:08 04/06/22 11:17 04/06/22 15:45 04/06/22 16:59 04/06/22 20:34 04/07/22 00:09 04/07/22 04:15 04/07/22 07:15 04/07/22 11:41 04/07/22 16:28 04/08/22 03:14 04/08/22 11:29  Glucose-Capillary 70 - 99 mg/dL 80 122 (H) 83 76 114 (H) 100 (H) 81 87 140 (H)  Novolog 2 units given 80 65 (L) 67 (L)   Diabetes history: DM 2 Outpatient Diabetes medications: Diet controlled Current orders for Inpatient glycemic control:  Novolog 0-15 units Q4 hours  5.7% on 04/05/22  Inpatient Diabetes Program Recommendations:    Hypoglycemia in the 60's after 2 units of Novolog given.   -  Consider decreasing Novolog to 0-6 units "very sensitive" starting at 151.  Thanks,  Tama Headings RN, MSN, BC-ADM Inpatient Diabetes Coordinator Team Pager [phone] (8a-5p)

## 2022-04-08 NOTE — TOC Progression Note (Signed)
Transition of Care [facility]) - Progression Note    Patient Details  Name: [name] [name] MRN: [id number] Date of Birth: 11-02-1930  Transition of Care [facility]) CM/SW [place], [place] Phone Number: 04/08/2022, 1:06 PM  Clinical Narrative:     CSW received consult for palliative services to follow patient at SNF. CSW spoke with patient daughter [name] [name] who gave CSW permission to make referral to [facility].CSW made referral to [facility] with [facility] for palliative services to follow patient at SNF. CSW will continue to follow and assist with patients dc planning needs.  Expected Discharge Plan: [place]    Expected Discharge Plan and Services Expected Discharge Plan: [place] arrangements for the past 2 months: [place]                                       Social Determinants of Health (SDOH) Interventions    Readmission Risk Interventions    09/12/2021    2:32 PM  Readmission Risk Prevention Plan  Transportation Screening Complete  Medication Review Press photographer) Complete  SW Recovery Care/Counseling Consult Complete  Palliative Care Screening Complete  Skilled Nursing Facility Complete

## 2022-04-08 NOTE — Progress Notes (Signed)
During rounds pt pleasantly confused.  Stated she would like coffee and graham crackers.  Pt agreed to have vital signs taken, cbg taken and have pericare performed.  Pt also agreed to have antibiotics administered.  CBG 65, food and coffee provided. Personal care provided, VSS and IV antibiotics administered per orders.  Will continue to monitor pt closely.

## 2022-04-08 NOTE — Care Management Important Message (Signed)
Important Message  Patient Details  Name: [name] [name] MRN: [id number] Date of Birth: 01/17/1930   Medicare Important Message Given:  Yes     [name] 04/08/2022, 3:20 PM

## 2022-04-09 DIAGNOSIS — R6521 Severe sepsis with septic shock: Secondary | ICD-10-CM | POA: Diagnosis not present

## 2022-04-09 DIAGNOSIS — A419 Sepsis, unspecified organism: Secondary | ICD-10-CM | POA: Diagnosis not present

## 2022-04-09 LAB — GLUCOSE, CAPILLARY
Glucose-Capillary: 151 mg/dL — ABNORMAL HIGH (ref 70–99)
Glucose-Capillary: 75 mg/dL (ref 70–99)
Glucose-Capillary: 76 mg/dL (ref 70–99)
Glucose-Capillary: 79 mg/dL (ref 70–99)
Glucose-Capillary: 82 mg/dL (ref 70–99)
Glucose-Capillary: 89 mg/dL (ref 70–99)
Glucose-Capillary: 92 mg/dL (ref 70–99)
Glucose-Capillary: 92 mg/dL (ref 70–99)

## 2022-04-09 NOTE — Progress Notes (Signed)
PROGRESS NOTE    [name] [name]  RDE:[id number] DOB: 1930/10/23 DOA: 04/04/2022  PCP: Pcp, No    Brief Narrative:  This 88 years old female with PMH significant for type 2 diabetes, hyperlipidemia, hypertension, CKD stage IV, dementia presented in the ED with complaints of altered mentation.  Patient was sent from [facility] skilled nursing facility.  Patient became bradycardic with a heart rate of 30s to 40s.  she was found to be hypothermic with a temp of 89.1 F, blood pressure 59/39,  lactic acid 3.7.  Patient was admitted for septic shock, initially intubated and admitted in ICU.  She subsequently extubated and started on IV antibiotics,  found to have UTI.  Hypothermia,  lactic acidosis and septic shock resolved.  Patient is on antibiotics.  PCCM pickup 04/07/2022.  Assessment & Plan:   Principal Problem:   Septic shock ([place])  Septic shock secondary to UTI; Patient presented with bradycardia, tachypnea, hypothermia, hypotension, lactic acid 3.7. UA positive for UTI Continue IV antibiotics. Sepsis physiology has resolved. Lactic acidosis resolved.  Hypothermia resolved. Complete course of antibiotics (ceftriaxone for 7 days) Last date 04/11/22.  Acute metabolic encephalopathy: Likely secondary to UTI.  Continue supportive care. Patient has in and out change in mental status likely delirium. Patient has hallucinations, anger outbursts. Psych recommended to continue Seroquel and  start Ativan bid.  Hyperkalemia: Potassium 6.3, patient was given D50, albuterol, insulin, Lokelma. K 5.3 .  Lokelma given . Recheck potassium.  Bradycardia Normal sinus rhythm on  EKG.  Last echocardiogram shows LVEF 50 to 55%. Continue to monitor, Avoid metoprolol  Thrombocytopenia: Likely secondary to sepsis.  Continue supportive care No signs of bleeding. Platelets 93 K  Diabetes mellitus type 2: Continue regular insulin sliding scale.  Essential hypertension: Blood pressure improving Resume  antiHTN medications.  AKI on CKD stage IV: Serum creatinine improving.  No indication for dialysis. Avoid nephrotoxic medications. Continue gentle hydration.  DNR: Continue ongoing goals of care discussions.  Generalized weakness: Could be secondary to sepsis.  PT and OT evaluation PT: SNF  DVT prophylaxis: heparin sq Code Status: DNR Family Communication: No family at bed side. Disposition Plan:  Status is: Inpatient Remains inpatient appropriate because:  Admitted for septic shock secondary to UTI and altered mentation,  intubated and then subsequently extubated.  Patient is on antibiotics.  Anticipated discharge to SNF 04/11/22     Consultants:  PCCM Cardiology  Procedures:  Intubated /Extubated.  Antimicrobials:  Anti-infectives (From admission, onward)    Start     Dose/Rate Route Frequency Ordered Stop   04/05/22 0145  cefTRIAXone (ROCEPHIN) 2 g in sodium chloride 0.9 % 100 mL IVPB        2 g 200 mL/hr over 30 Minutes Intravenous Every 24 hours 04/05/22 0144 04/11/22 2359        Subjective: Patient was seen and examined at bedside. Overnight events noted. Patient reports feeling better,  she appears much calmer today.   She is still confused , denies any hallucinations.  She is comfortable.  Objective: Vitals:   04/08/22 1642 04/08/22 2003 04/09/22 0403 04/09/22 0802  BP: (!) 153/54 118/60 128/70 122/84  Pulse: 76 (!) 53 (!) 56 61  Resp: 11 18 18 14   Temp: 98.3 F (36.8 C) 97.9 F (36.6 C) 98.2 F (36.8 C) 97.9 F (36.6 C)  TempSrc: Axillary Oral Oral Oral  SpO2: 93% 97% 97% 98%  Weight:   95.7 kg   Height:  Intake/Output Summary (Last 24 hours) at 04/09/2022 1329 Last data filed at 04/09/2022 0600 Gross per 24 hour  Intake 720 ml  Output 600 ml  Net 120 ml   Filed Weights   04/07/22 0420 04/08/22 0330 04/09/22 0403  Weight: 100.3 kg 98.7 kg 95.7 kg    Examination:  General exam: Appears comfortable, not in any acute distress.   Deconditioned Respiratory system: CTA bilaterally, no wheezing, no crackles,  normal respiratory effort.  Cardiovascular system: S1-S2 heard, regular rate and rhythm, no murmur. Gastrointestinal system: Abdomen is soft, non tender, non distended, BS+ Central nervous system: Alert and oriented x 2 . No focal neurological deficits. Extremities: No edema, no cyanosis, no clubbing. Skin: No rashes, lesions or ulcers Psychiatry: Mood and affect appropriate.    Data Reviewed: I have personally reviewed following labs and imaging studies  CBC: Recent Labs  Lab 04/04/22 2318 04/05/22 0725 04/06/22 0505  WBC 5.3 5.1 5.6  NEUTROABS 1.9  --   --   HGB 11.8* 9.6* 9.9*  HCT 38.7 31.0* 31.5*  MCV 100.8* 99.0 99.7  PLT 81* 89* 96*   Basic Metabolic Panel: Recent Labs  Lab 04/04/22 2318 04/05/22 0725 04/06/22 0505 04/06/22 1355 04/07/22 0634 04/07/22 1402  NA 140 135 141 139 139  --   K 6.3* 5.2* 6.7* 6.0* 5.6* 5.3*  CL 109 104 111 110 110  --   CO2 21* 21* 23 23 27   --   GLUCOSE 109* 81 89 83 78  --   BUN 84* 74* 65* 64* 53*  --   CREATININE 2.58* 2.40* 2.18* 2.04* 1.96*  --   CALCIUM 9.8 8.9 9.3 9.5 9.6  --   MG 2.1  --  1.8  --   --   --   PHOS  --   --  4.0  --   --   --    GFR: Estimated Creatinine Clearance: 24.7 mL/min (A) (by C-G formula based on SCr of 1.96 mg/dL (H)). Liver Function Tests: Recent Labs  Lab 04/04/22 2318  AST 23  ALT 28  ALKPHOS 65  BILITOT 0.4  PROT 7.1  ALBUMIN 3.4*   No results for input(s): LIPASE, AMYLASE in the last 168 hours. No results for input(s): AMMONIA in the last 168 hours. Coagulation Profile: Recent Labs  Lab 04/05/22 0015  INR 1.1   Cardiac Enzymes: No results for input(s): CKTOTAL, CKMB, CKMBINDEX, TROPONINI in the last 168 hours. BNP (last 3 results) No results for input(s): PROBNP in the last 8760 hours. HbA1C: No results for input(s): HGBA1C in the last 72 hours.  CBG: Recent Labs  Lab 04/09/22 0001  04/09/22 0310 04/09/22 0618 04/09/22 0812 04/09/22 1111  GLUCAP 79 75 76 92 82   Lipid Profile: No results for input(s): CHOL, HDL, LDLCALC, TRIG, CHOLHDL, LDLDIRECT in the last 72 hours. Thyroid Function Tests: No results for input(s): TSH, T4TOTAL, FREET4, T3FREE, THYROIDAB in the last 72 hours. Anemia Panel: No results for input(s): VITAMINB12, FOLATE, FERRITIN, TIBC, IRON, RETICCTPCT in the last 72 hours. Sepsis Labs: Recent Labs  Lab 04/04/22 2318 04/05/22 0251 04/05/22 0725  PROCALCITON  --   --  0.30  LATICACIDVEN 2.2* 3.7* 2.0*    Recent Results (from the past 240 hour(s))  Culture, blood (routine x 2)     Status: None (Preliminary result)   Collection Time: 04/04/22 12:15 AM   Specimen: BLOOD RIGHT HAND  Result Value Ref Range Status   Specimen Description BLOOD RIGHT HAND  Final   Special Requests   Final    BOTTLES DRAWN AEROBIC AND ANAEROBIC Blood Culture results may not be optimal due to an inadequate volume of blood received in culture bottles   Culture   Final    NO GROWTH 4 DAYS Performed at [facility], [place] [address] [place], [place] [address]    Report Status PENDING  Incomplete  Urine Culture     Status: Abnormal   Collection Time: 04/04/22 11:54 PM   Specimen: In/Out Cath Urine  Result Value Ref Range Status   Specimen Description IN/OUT CATH URINE  Final   Special Requests   Final    NONE Performed at [facility], [place] [address], [place], [place] [address]    Culture >=100,000 COLONIES/mL CITROBACTER FREUNDII (A)  Final   Report Status 04/07/2022 FINAL  Final   Organism ID, Bacteria CITROBACTER FREUNDII (A)  Final      Susceptibility   Citrobacter freundii - MIC*    CEFAZOLIN >=64 RESISTANT Resistant     CEFEPIME <=0.12 SENSITIVE Sensitive     CEFTRIAXONE <=0.25 SENSITIVE Sensitive     CIPROFLOXACIN <=0.25 SENSITIVE Sensitive     GENTAMICIN <=1 SENSITIVE Sensitive     IMIPENEM 0.5 SENSITIVE Sensitive     NITROFURANTOIN <=16  SENSITIVE Sensitive     TRIMETH/SULFA <=20 SENSITIVE Sensitive     PIP/TAZO <=4 SENSITIVE Sensitive     * >=100,000 COLONIES/mL CITROBACTER FREUNDII  Culture, blood (routine x 2)     Status: None (Preliminary result)   Collection Time: 04/05/22 12:37 AM   Specimen: BLOOD RIGHT ARM  Result Value Ref Range Status   Specimen Description BLOOD RIGHT ARM  Final   Special Requests   Final    BOTTLES DRAWN AEROBIC AND ANAEROBIC Blood Culture adequate volume   Culture   Final    NO GROWTH 4 DAYS Performed at [facility], [address], [place], [place] [address]    Report Status PENDING  Incomplete  MRSA Next Gen by PCR, Nasal     Status: None   Collection Time: 04/05/22  9:00 AM   Specimen: Nasal Mucosa; Nasal Swab  Result Value Ref Range Status   MRSA by PCR Next Gen NOT DETECTED NOT DETECTED Final    Comment: (NOTE) The GeneXpert MRSA Assay (FDA approved for NASAL specimens only), is one component of a comprehensive MRSA colonization surveillance program. It is not intended to diagnose MRSA infection nor to guide or monitor treatment for MRSA infections. Test performance is not FDA approved in patients less than 60 years old. Performed at [facility], [place] [address] [place], [place] [address]     Radiology Studies: No results found.  Scheduled Meds:  chlorhexidine  15 mL Mouth Rinse BID   dextrose  1 ampule Intravenous Once   heparin  5,000 Units Subcutaneous Q8H   insulin aspart  0-15 Units Subcutaneous Q4H   LORazepam  1 mg Oral BID   Or   LORazepam  1 mg Intravenous BID   mouth rinse  15 mL Mouth Rinse q12n4p   melatonin  3 mg Oral QHS   pantoprazole  40 mg Oral Daily   QUEtiapine  25 mg Oral Daily   Continuous Infusions:  sodium chloride 10 mL/hr at 04/06/22 1000   cefTRIAXone (ROCEPHIN)  IV 2 g (04/08/22 2317)     LOS: 4 days    Time spent: 35 mins   Shawna Clamp, MD [facility]   If 7PM-7AM, please contact night-coverage

## 2022-04-09 NOTE — Consult Note (Cosign Needed)
[name] [name] Followup Face-to-Face Psychiatric Evaluation   Service Date: Apr 09, 2022 LOS:  LOS: 4 days    Assessment  [name] [name] is a 88 y.o. female admitted medically for 04/04/2022 11:07 PM for septic shock and acute metabolic encephalopathy in the setting of UTI and dementia. [name] was consulted for patient's hallucinations and confusion by Dr. Shawna Clamp.  At this time, the patient's presentation of altered mental status and disorientation is most consistent with delirium, most likely due to multiple etiologies including but not limited to patient's UTI, medications, altered sleep/wake cycle, and limited mobility. Patient's mood and speech have mildly improved compared to yesterday, although patient is still disoriented and confused. Patient reports better sleep quality overnight than previous nights. Patient is tolerating psychotropic med regimen and reported no side effects. The patient would strongly benefit from continuing the medical treatment of UTI and management of insomnia. During this time period, minimization of delirogenic insults will be of utmost importance; this includes promoting the normal circadian cycle, minimizing lines/tubes, avoiding deliriogenic medications such as benzodiazepines and anticholinergic medications, and frequently reorienting the patient. Symptomatic treatment for agitation can be provided by antipsychotic medications, though it is important to remember that these do not treat the underlying etiology of delirium. Notably, there can be a time lag effect between treatment of a medical problem and resolution of delirium. This time lag effect may be of longer duration in the elderly, and those with underlying cognitive impairment such as this patient's dementia.  Diagnoses:  Active Hospital problems: Principal Problem:   Septic shock ([place])  Plan  ## Safety and Observation Level:  - Based on my clinical evaluation, I estimate the patient  to be at moderate risk of self harm in the current setting - At this time, we recommend a frequent level of observation with delirium, elopement and fall precautions. This decision is based on my review of the chart including patient's history and current presentation, interview of the patient, mental status examination, and consideration of suicide risk including evaluating suicidal ideation, plan, intent, suicidal or self-harm behaviors, risk factors, and protective factors. This judgment is based on our ability to directly address suicide risk, implement suicide prevention strategies and develop a safety plan while the patient is in the clinical setting. Please contact our team if there is a concern that risk level has changed.  ## Medications:  -- Continue low-dose Ativan 1 mg, oral, BID for anxiety, agitation, and insomnia and taper from patient's previous Ativan 2 mg BID. -- Back-up plan: IV Ativan 1 mg, BID if patient does not tolerate oral. -- Continue Melatonin 3 mg, oral, at bedtime for insomnia. -- Can consider adding Depakote to regimen if agitation worsens. -- If patient's agitation is not managed by current Ativan regimen, advise first adding Depakote to regimen before considering increasing Ativan.  ## Medical Decision Making Capacity:  Not formally assessed  ## Management: -- CIWA q8h  ## Further Work-up:  -- most recent EKG on 5/26 had QtC of 404  ## Disposition:  -- Patient can return to her SNF once medically clear.  Thank you for this consult request. Recommendations have been communicated to the primary team. We will sign-off at this time.  Maveryck Bahri, Medical Student  Follow-up history  Relevant Aspects of Hospital Course:  Admitted on 04/04/2022 for acute toxic metabolic encephalopathy in the setting of dementia and UTI.  Patient Report:  Patient is oriented to person, place, and month but mentioned that the year  is 1931. Patient could not recall 3  object words after 5 minutes. Patient was able to recite days of the week forwards but not backwards. Patient's speech is slow and occasionally garbled and has mildly improved compared to yesterday.  Patient reported no acute events overnight and no side effects from medications. Reported that her mood is good and that she physically feels well and feels ready to leave hospital. Patient had some perseveration on leaving hospital and addresses interviewer as [name] [name]. Based off of conversation, patient still appears confused but is less tangential with thought content compared to yesterday.  ROS:  Patient reports no SI, HI, AVH, and nightmares today.   Patient reported difficulty sleeping during hospital stay - however, she reported improved sleep quality overnight and reported she was able to get about 5 hours of sleep last night.  Collateral information:  Patient's sitter reported patient will occasionally sing or pray. Reported that patient pulled out her IV's this morning. Otherwise, agitation seems to be improving compared to previous days.  Psychiatric History:  Information collected from patient - Patient reports no previous psychiatric hospitalizations, diagnoses, or medications in the past.   From chart: patient has hx of MDD and previous SI.   Family psych history: Unable to obtain  Social History:  Unable to obtain  Family History:  The patient's family history is not on file.  Medical History: Past Medical History:  Diagnosis Date   Diabetes mellitus    Hyperlipidemia    Hypertension     Surgical History: Past Surgical History:  Procedure Laterality Date   APPENDECTOMY     CHOLECYSTECTOMY     COLONOSCOPY  06/23/2012   Procedure: COLONOSCOPY;  Surgeon: Lear Ng, MD;  Location: [place];  Service: Endoscopy;  Laterality: N/A;   ESOPHAGOGASTRODUODENOSCOPY  06/23/2012   Procedure: ESOPHAGOGASTRODUODENOSCOPY (EGD);  Surgeon: Lear Ng, MD;   Location[facility] ENDOSCOPY;  Service: Endoscopy;  Laterality: N/A;    Medications:   Current Facility-Administered Medications:    0.9 %  sodium chloride infusion, 250 mL, Intravenous, Continuous, Estill Cotta, NP, Last Rate: 10 mL/hr at 04/06/22 1000, Infusion Verify at 04/06/22 1000   acetaminophen (TYLENOL) tablet 650 mg, 650 mg, Oral, Q6H PRN, Elsie Lincoln, MD, 650 mg at 04/06/22 2349   cefTRIAXone (ROCEPHIN) 2 g in sodium chloride 0.9 % 100 mL IVPB, 2 g, Intravenous, Y70W, Delora Fuel, MD, Last Rate: 200 mL/hr at 04/08/22 2317, 2 g at 04/08/22 2317   chlorhexidine (PERIDEX) 0.12 % solution 15 mL, 15 mL, Mouth Rinse, BID, Candee Furbish, MD, 15 mL at 04/08/22 2010   dextrose 50 % solution 50 mL, 1 ampule, Intravenous, Once, Shawna Clamp, MD   docusate sodium (COLACE) capsule 100 mg, 100 mg, Oral, BID PRN, Estill Cotta, NP   heparin injection 5,000 Units, 5,000 Units, Subcutaneous, Q8H, Estill Cotta, NP, 5,000 Units at 04/09/22 2376   insulin aspart (novoLOG) injection 0-15 Units, 0-15 Units, Subcutaneous, Q4H, Estill Cotta, NP, 2 Units at 04/07/22 1213   LORazepam (ATIVAN) tablet 1 mg, 1 mg, Oral, BID **OR** LORazepam (ATIVAN) injection 1 mg, 1 mg, Intravenous, BID, Merrily Brittle, DO, 1 mg at 04/08/22 2017   MEDLINE mouth rinse, 15 mL, Mouth Rinse, q12n4p, Candee Furbish, MD, 15 mL at 04/06/22 1143   melatonin tablet 3 mg, 3 mg, Oral, QHS, Merrily Brittle, DO, 3 mg at 04/08/22 2008   pantoprazole (PROTONIX) EC tablet 40 mg, 40 mg, Oral, Daily, Shawna Clamp, MD  polyethylene glycol (MIRALAX / GLYCOLAX) packet 17 g, 17 g, Oral, Daily PRN, Estill Cotta, NP   QUEtiapine (SEROQUEL) tablet 25 mg, 25 mg, Oral, Daily, Rosezella Rumpf, NP, 25 mg at 04/06/22 2310  Allergies: Allergies  Allergen Reactions   Alendronate     Other reaction(s): Other (See Comments) Poor renal function.      Objective  Vital signs:  Temp:  [97.9 F (36.6 C)-98.3 F (36.8 C)]  98.2 F (36.8 C) (05/31 0403) Pulse Rate:  [53-76] 56 (05/31 0403) Resp:  [11-20] 18 (05/31 0403) BP: (118-153)/(54-70) 128/70 (05/31 0403) SpO2:  [93 %-97 %] 97 % (05/31 0403) Weight:  [95.7 kg] 95.7 kg (05/31 0403)  Psychiatric Specialty Exam:  Presentation  General Appearance: Appropriate for Environment  Eye Contact:Fair  Speech:Slow; Other (comment) (less garbled compared to yesterday)  Speech Volume:Decreased  Handedness:No data recorded  Mood and Affect  Mood:Euthymic; Anxious  Affect:Appropriate   Thought Process  Thought Processes:Disorganized  Descriptions of Associations:Tangential  Orientation:Partial (oriented to person and place)  Thought Content:Perseveration; Tangential  History of Schizophrenia/Schizoaffective disorder:No data recorded Duration of Psychotic Symptoms:No data recorded Hallucinations:Hallucinations: None  Ideas of Reference:None  Suicidal Thoughts:Suicidal Thoughts: No  Homicidal Thoughts:Homicidal Thoughts: No   Sensorium  Memory:Immediate Poor; Recent Poor; Remote Poor  Judgment:Impaired  Insight:Poor   Executive Functions  Concentration:Poor  Attention Span:Fair  Recall:Poor  Fund of Knowledge:Poor  Language:Fair   Psychomotor Activity  Psychomotor Activity:Psychomotor Activity: Decreased   Assets  Assets:Housing   Sleep  Sleep:Sleep: Fair Number of Hours of Sleep: 5   Physical Exam Constitutional:      General: She is not in acute distress. HENT:     Head: Normocephalic.  Pulmonary:     Effort: Pulmonary effort is normal.  Skin:    General: Skin is warm and dry.   Review of Systems  Constitutional:  Negative for fever.  Respiratory:  Negative for shortness of breath.   Gastrointestinal:  Negative for nausea and vomiting.   Blood pressure 128/70, pulse (!) 56, temperature 98.2 F (36.8 C), temperature source Oral, resp. rate 18, height 6' (1.829 m), weight 95.7 kg, SpO2 97 %. Body mass  index is 28.61 kg/m.  EKG: Sinus or ectopic atrial bradycardia Prolonged PR interval When compared with ECG of 10/13/2021, HEART RATE has decreased Premature atrial complexes are no longer present Confirmed by Delora Fuel ([id number]) on 04/04/2022 11:10:51 PM, HR 34, QTcB 404  Date/Time:  Friday Apr 04 2022 23:07:45 EDT Ventricular Rate:  34 PR Interval:  358 QRS Duration: 134 QT Interval:  537 QTC Calculation: 404 R Axis:   2 Text Interpretation: Sinus or ectopic atrial bradycardia Prolonged PR interval When compared with ECG of 10/13/2021, HEART RATE has decreased Premature atrial complexes are no longer present Confirmed by Delora Fuel ([id number]) on 04/04/2022 11:10:51 PM           Component Value Date/Time   NA 139 04/07/2022 0634   K 5.3 (H) 04/07/2022 1402   CREATININE 1.96 (H) 04/07/2022 0634   BUN 53 (H) 04/07/2022 0634   HGB 9.9 (L) 04/06/2022 0505   MCV 99.7 04/06/2022 0505   WBC 5.6 04/06/2022 0505   PLT 96 (L) 04/06/2022 0505   INR 1.1 04/05/2022 0015      Component Value Date/Time   AST 23 04/04/2022 2318   ALT 28 04/04/2022 2318   ALKPHOS 65 04/04/2022 2318      Component Value Date/Time   BILITOT 0.4 04/04/2022 2318   BILIDIR  0.1 09/26/2021 1608   IBILI 0.9 09/26/2021 1608      Component Value Date/Time   IRON 46 09/11/2021 0620   TIBC 225 (L) 09/11/2021 0620   FERRITIN 205 12/28/2014 1617   IRONPCTSAT 20 09/11/2021 0620       Component Value Date/Time   TSH 1.971 10/01/2021 1605   TSH 1.992 12/28/2014 1559      Component Value Date/Time   VITAMINB12 247 10/01/2021 1605   FOLATE 12.8 07/18/2021 [employer], Medical Student 04/09/2022, 1:30 PM

## 2022-04-10 ENCOUNTER — Encounter (HOSPITAL_COMMUNITY): Payer: Self-pay | Admitting: Pulmonary Disease

## 2022-04-10 DIAGNOSIS — R6521 Severe sepsis with septic shock: Secondary | ICD-10-CM | POA: Diagnosis not present

## 2022-04-10 DIAGNOSIS — A419 Sepsis, unspecified organism: Secondary | ICD-10-CM | POA: Diagnosis not present

## 2022-04-10 LAB — CBC
HCT: 33.7 % — ABNORMAL LOW (ref 36.0–46.0)
Hemoglobin: 10.4 g/dL — ABNORMAL LOW (ref 12.0–15.0)
MCH: 31 pg (ref 26.0–34.0)
MCHC: 30.9 g/dL (ref 30.0–36.0)
MCV: 100.3 fL — ABNORMAL HIGH (ref 80.0–100.0)
Platelets: 126 10*3/uL — ABNORMAL LOW (ref 150–400)
RBC: 3.36 MIL/uL — ABNORMAL LOW (ref 3.87–5.11)
RDW: 13.7 % (ref 11.5–15.5)
WBC: 6.7 10*3/uL (ref 4.0–10.5)
nRBC: 0 % (ref 0.0–0.2)

## 2022-04-10 LAB — BASIC METABOLIC PANEL
Anion gap: 7 (ref 5–15)
Anion gap: 5 (ref 5–15)
BUN: 65 mg/dL — ABNORMAL HIGH (ref 8–23)
BUN: 32 mg/dL — ABNORMAL HIGH (ref 8–23)
CO2: 23 mmol/L (ref 22–32)
CO2: 28 mmol/L (ref 22–32)
Calcium: 9.3 mg/dL (ref 8.9–10.3)
Calcium: 9.6 mg/dL (ref 8.9–10.3)
Chloride: 111 mmol/L (ref 98–111)
Chloride: 107 mmol/L (ref 98–111)
Creatinine, Ser: 2.18 mg/dL — ABNORMAL HIGH (ref 0.44–1.00)
Creatinine, Ser: 1.88 mg/dL — ABNORMAL HIGH (ref 0.44–1.00)
GFR, Estimated: 21 mL/min — ABNORMAL LOW (ref >60)
GFR, Estimated: 25 mL/min — ABNORMAL LOW (ref >60)
Glucose, Bld: 89 mg/dL (ref 70–99)
Glucose, Bld: 92 mg/dL (ref 70–99)
Potassium: 6.7 mmol/L (ref 3.5–5.1)
Potassium: 5.5 mmol/L — ABNORMAL HIGH (ref 3.5–5.1)
Sodium: 141 mmol/L (ref 135–145)
Sodium: 140 mmol/L (ref 135–145)

## 2022-04-10 LAB — GLUCOSE, CAPILLARY
Glucose-Capillary: 80 mg/dL (ref 70–99)
Glucose-Capillary: 73 mg/dL (ref 70–99)
Glucose-Capillary: 83 mg/dL (ref 70–99)
Glucose-Capillary: 91 mg/dL (ref 70–99)
Glucose-Capillary: 90 mg/dL (ref 70–99)

## 2022-04-10 LAB — CULTURE, BLOOD (ROUTINE X 2)
Culture: NO GROWTH
Culture: NO GROWTH
Special Requests: ADEQUATE

## 2022-04-10 LAB — MAGNESIUM: Magnesium: 1.6 mg/dL — ABNORMAL LOW (ref 1.7–2.4)

## 2022-04-10 LAB — PHOSPHORUS: Phosphorus: 4 mg/dL (ref 2.5–4.6)

## 2022-04-10 MED ORDER — SODIUM ZIRCONIUM CYCLOSILICATE 10 G PO PACK
10.0000 g | PACK | Freq: Two times a day (BID) | ORAL | Status: DC
  Administered 2022-04-11 (×2): 10 g via ORAL
  Filled 2022-04-10 (×5): qty 1

## 2022-04-10 NOTE — Progress Notes (Signed)
Patient refusing vital sign check and blood sugar check

## 2022-04-10 NOTE — TOC Progression Note (Signed)
Transition of Care [facility]) - Progression Note    Patient Details  Name: [name] [name] MRN: [id number] Date of Birth: 04-08-1930  Transition of Care [facility]) CM/SW [place], [place] Phone Number: 04/10/2022, 11:33 AM  Clinical Narrative:     Plan is for patient to return to [facility] when medically ready for dc. CSW will continue to follow and assist with patients dc planning needs.  Expected Discharge Plan: [place]    Expected Discharge Plan and Services Expected Discharge Plan: [place] arrangements for the past 2 months: [place]                                       Social Determinants of Health (SDOH) Interventions    Readmission Risk Interventions    09/12/2021    2:32 PM  Readmission Risk Prevention Plan  Transportation Screening Complete  Medication Review Press photographer) Complete  SW Recovery Care/Counseling Consult Complete  Palliative Care Screening Complete  Skilled Nursing Facility Complete

## 2022-04-10 NOTE — Progress Notes (Signed)
Patient refusing to wear telemetry.

## 2022-04-10 NOTE — Progress Notes (Addendum)
Patient combative, spitting, hitting, scratching, and verbally aggressive. Ms. Rademaker refusing to take oral medications. (MD notified at 2330) Erling Conte, RN

## 2022-04-10 NOTE — Progress Notes (Signed)
PROGRESS NOTE    [name] [name]  DJS:[id number] DOB: 07-29-1930 DOA: 04/04/2022  PCP: Pcp, No    Brief Narrative:  This 88 years old female with PMH significant for type 2 diabetes, hyperlipidemia, hypertension, CKD stage IV, dementia presented in the ED with complaints of altered mentation.  Patient was sent from [facility] skilled nursing facility.  Patient became bradycardic with a heart rate of 30s to 40s.  she was found to be hypothermic with a temp of 89.1 F, blood pressure 59/39,  lactic acid 3.7.  Patient was admitted for septic shock, initially intubated and admitted in ICU.  She subsequently extubated and started on IV antibiotics,  found to have UTI.  Hypothermia,  lactic acidosis and septic shock resolved.  Patient is on antibiotics.  PCCM pickup 04/07/2022.  04/10/2022: Patient seen.  Patient is a poor historian.  Potassium is elevated at 5.5.  We will start patient on [place].  No new complaints endorsed.  Assessment & Plan:   Principal Problem:   Septic shock ([place])  Septic shock secondary to UTI; Patient presented with bradycardia, tachypnea, hypothermia, hypotension, lactic acid 3.7. UA positive for UTI Continue IV antibiotics. Sepsis physiology has resolved. Lactic acidosis resolved.  Hypothermia resolved. Complete course of antibiotics (ceftriaxone for 7 days) Last date 04/11/22.  Acute metabolic encephalopathy: Likely secondary to UTI.  Continue supportive care. Patient has in and out change in mental status likely delirium. Patient has hallucinations, anger outbursts. Psych recommended to continue Seroquel and  start Ativan bid.  Hyperkalemia: Potassium 6.3, patient was given D50, albuterol, insulin, Lokelma. K 5.3 .  Lokelma given . Recheck potassium. 04/10/2022: Potassium is 5.5 today.  Start patient on [place].  Continue to monitor renal function and electrolytes.  Bradycardia Normal sinus rhythm on  EKG.  Last echocardiogram shows LVEF 50 to 55%. Continue to monitor,  Avoid metoprolol  Thrombocytopenia: Likely secondary to sepsis.  Continue supportive care No signs of bleeding. Platelets 93 K  Diabetes mellitus type 2: Continue regular insulin sliding scale.  Essential hypertension: Blood pressure improving Resume antiHTN medications.  AKI on CKD stage IV: Serum creatinine improving.  No indication for dialysis. Avoid nephrotoxic medications. Continue gentle hydration. 04/10/2022: AKI slowly resolving.  Serum creatinine is down to 1.88.  DNR: Continue ongoing goals of care discussions.  Generalized weakness: Could be secondary to sepsis.  PT and OT evaluation PT: SNF  DVT prophylaxis: heparin sq Code Status: DNR Family Communication: No family at bed side. Disposition Plan:  Status is: Inpatient Remains inpatient appropriate because:  Admitted for septic shock secondary to UTI and altered mentation,  intubated and then subsequently extubated.  Patient is on antibiotics.  Anticipated discharge to SNF 04/11/22     Consultants:  PCCM Cardiology  Procedures:  Intubated /Extubated.  Antimicrobials:  Anti-infectives (From admission, onward)    Start     Dose/Rate Route Frequency Ordered Stop   04/05/22 0145  cefTRIAXone (ROCEPHIN) 2 g in sodium chloride 0.9 % 100 mL IVPB        2 g 200 mL/hr over 30 Minutes Intravenous Every 24 hours 04/05/22 0144 04/11/22 2359        Subjective: No new complaints today.  Objective: Vitals:   04/09/22 1500 04/09/22 2003 04/10/22 0413 04/10/22 1515  BP: 136/73 133/87 (!) 147/98 (!) 143/68  Pulse: (!) 52 63 (!) 58 (!) 53  Resp: 13 15 14 15   Temp: 97.6 F (36.4 C) (!) 96.3 F (35.7 C) (!) 96.3 F (35.7 C) (!)  96.4 F (35.8 C)  TempSrc: Axillary Axillary Axillary Axillary  SpO2: 95% 97% 100%   Weight:   93.9 kg   Height:        Intake/Output Summary (Last 24 hours) at 04/10/2022 1557 Last data filed at 04/10/2022 0415 Gross per 24 hour  Intake --  Output 1200 ml  Net -1200 ml    Filed  Weights   04/08/22 0330 04/09/22 0403 04/10/22 0413  Weight: 98.7 kg 95.7 kg 93.9 kg    Examination:  General exam: Patient is awake and alert.  Patient is not in any distress.   Respiratory system: CTA bilaterally Cardiovascular system: S1-S2 heard. Gastrointestinal system: Abdomen is soft, non tender, non distended, BS+ Central nervous system: Alert and oriented x 2 . No focal neurological deficits. Extremities: No edema  Data Reviewed: I have personally reviewed following labs and imaging studies  CBC: Recent Labs  Lab 04/04/22 2318 04/05/22 0725 04/06/22 0505 04/10/22 0243  WBC 5.3 5.1 5.6 6.7  NEUTROABS 1.9  --   --   --   HGB 11.8* 9.6* 9.9* 10.4*  HCT 38.7 31.0* 31.5* 33.7*  MCV 100.8* 99.0 99.7 100.3*  PLT 81* 89* 96* 126*    Basic Metabolic Panel: Recent Labs  Lab 04/04/22 2318 04/05/22 0725 04/06/22 0505 04/06/22 1355 04/07/22 0634 04/07/22 1402 04/10/22 0243  NA 140 135 141 139 139  --  140  K 6.3* 5.2* 6.7* 6.0* 5.6* 5.3* 5.5*  CL 109 104 111 110 110  --  107  CO2 21* 21* 23 23 27   --  28  GLUCOSE 109* 81 89 83 78  --  92  BUN 84* 74* 65* 64* 53*  --  32*  CREATININE 2.58* 2.40* 2.18* 2.04* 1.96*  --  1.88*  CALCIUM 9.8 8.9 9.3 9.5 9.6  --  9.6  MG 2.1  --  1.8  --   --   --  1.6*  PHOS  --   --  4.0  --   --   --  4.0    GFR: Estimated Creatinine Clearance: 25.6 mL/min (A) (by C-G formula based on SCr of 1.88 mg/dL (H)). Liver Function Tests: Recent Labs  Lab 04/04/22 2318  AST 23  ALT 28  ALKPHOS 65  BILITOT 0.4  PROT 7.1  ALBUMIN 3.4*    No results for input(s): LIPASE, AMYLASE in the last 168 hours. No results for input(s): AMMONIA in the last 168 hours. Coagulation Profile: Recent Labs  Lab 04/05/22 0015  INR 1.1    Cardiac Enzymes: No results for input(s): CKTOTAL, CKMB, CKMBINDEX, TROPONINI in the last 168 hours. BNP (last 3 results) No results for input(s): PROBNP in the last 8760 hours. HbA1C: No results for  input(s): HGBA1C in the last 72 hours.  CBG: Recent Labs  Lab 04/09/22 1958 04/09/22 2357 04/10/22 0411 04/10/22 0726 04/10/22 1116  GLUCAP 151* 89 80 73 83    Lipid Profile: No results for input(s): CHOL, HDL, LDLCALC, TRIG, CHOLHDL, LDLDIRECT in the last 72 hours. Thyroid Function Tests: No results for input(s): TSH, T4TOTAL, FREET4, T3FREE, THYROIDAB in the last 72 hours. Anemia Panel: No results for input(s): VITAMINB12, FOLATE, FERRITIN, TIBC, IRON, RETICCTPCT in the last 72 hours. Sepsis Labs: Recent Labs  Lab 04/04/22 2318 04/05/22 0251 04/05/22 0725  PROCALCITON  --   --  0.30  LATICACIDVEN 2.2* 3.7* 2.0*     Recent Results (from the past 240 hour(s))  Culture, blood (routine x 2)  Status: None (Preliminary result)   Collection Time: 04/04/22 12:15 AM   Specimen: BLOOD RIGHT HAND  Result Value Ref Range Status   Specimen Description BLOOD RIGHT HAND  Final   Special Requests   Final    BOTTLES DRAWN AEROBIC AND ANAEROBIC Blood Culture results may not be optimal due to an inadequate volume of blood received in culture bottles   Culture   Final    NO GROWTH 4 DAYS Performed at [facility], [place] [address] [place], [place] [address]    Report Status PENDING  Incomplete  Urine Culture     Status: Abnormal   Collection Time: 04/04/22 11:54 PM   Specimen: In/Out Cath Urine  Result Value Ref Range Status   Specimen Description IN/OUT CATH URINE  Final   Special Requests   Final    NONE Performed at [facility], [employer] [address] [place], [place] [address]    Culture >=100,000 COLONIES/mL CITROBACTER FREUNDII (A)  Final   Report Status 04/07/2022 FINAL  Final   Organism ID, Bacteria CITROBACTER FREUNDII (A)  Final      Susceptibility   Citrobacter freundii - MIC*    CEFAZOLIN >=64 RESISTANT Resistant     CEFEPIME <=0.12 SENSITIVE Sensitive     CEFTRIAXONE <=0.25 SENSITIVE Sensitive     CIPROFLOXACIN <=0.25 SENSITIVE Sensitive     GENTAMICIN  <=1 SENSITIVE Sensitive     IMIPENEM 0.5 SENSITIVE Sensitive     NITROFURANTOIN <=16 SENSITIVE Sensitive     TRIMETH/SULFA <=20 SENSITIVE Sensitive     PIP/TAZO <=4 SENSITIVE Sensitive     * >=100,000 COLONIES/mL CITROBACTER FREUNDII  Culture, blood (routine x 2)     Status: None (Preliminary result)   Collection Time: 04/05/22 12:37 AM   Specimen: BLOOD RIGHT ARM  Result Value Ref Range Status   Specimen Description BLOOD RIGHT ARM  Final   Special Requests   Final    BOTTLES DRAWN AEROBIC AND ANAEROBIC Blood Culture adequate volume   Culture   Final    NO GROWTH 4 DAYS Performed at [facility], [address] [place], [place] [address]    Report Status PENDING  Incomplete  MRSA Next Gen by PCR, Nasal     Status: None   Collection Time: 04/05/22  9:00 AM   Specimen: Nasal Mucosa; Nasal Swab  Result Value Ref Range Status   MRSA by PCR Next Gen NOT DETECTED NOT DETECTED Final    Comment: (NOTE) The GeneXpert MRSA Assay (FDA approved for NASAL specimens only), is one component of a comprehensive MRSA colonization surveillance program. It is not intended to diagnose MRSA infection nor to guide or monitor treatment for MRSA infections. Test performance is not FDA approved in patients less than 42 years old. Performed at [facility], [place] [address], [place], [place] [address]      Radiology Studies: No results found.  Scheduled Meds:  chlorhexidine  15 mL Mouth Rinse BID   dextrose  1 ampule Intravenous Once   heparin  5,000 Units Subcutaneous Q8H   insulin aspart  0-15 Units Subcutaneous Q4H   LORazepam  1 mg Oral BID   Or   LORazepam  1 mg Intravenous BID   mouth rinse  15 mL Mouth Rinse q12n4p   melatonin  3 mg Oral QHS   pantoprazole  40 mg Oral Daily   QUEtiapine  25 mg Oral Daily   Continuous Infusions:  sodium chloride 10 mL/hr at  04/06/22 1000   cefTRIAXone (ROCEPHIN)  IV 2 g (04/09/22 2327)     LOS: 5 days    Time spent: 38  mins   Bonnell Public, MD [facility]   If 7PM-7AM, please contact night-coverage

## 2022-04-11 ENCOUNTER — Encounter (HOSPITAL_COMMUNITY): Payer: Self-pay | Admitting: Pulmonary Disease

## 2022-04-11 DIAGNOSIS — R6521 Severe sepsis with septic shock: Secondary | ICD-10-CM | POA: Diagnosis not present

## 2022-04-11 DIAGNOSIS — A419 Sepsis, unspecified organism: Secondary | ICD-10-CM | POA: Diagnosis not present

## 2022-04-11 LAB — GLUCOSE, CAPILLARY
Glucose-Capillary: 70 mg/dL (ref 70–99)
Glucose-Capillary: 77 mg/dL (ref 70–99)
Glucose-Capillary: 90 mg/dL (ref 70–99)
Glucose-Capillary: 88 mg/dL (ref 70–99)
Glucose-Capillary: 70 mg/dL (ref 70–99)
Glucose-Capillary: 115 mg/dL — ABNORMAL HIGH (ref 70–99)
Glucose-Capillary: 69 mg/dL — ABNORMAL LOW (ref 70–99)
Glucose-Capillary: 110 mg/dL — ABNORMAL HIGH (ref 70–99)

## 2022-04-11 LAB — RENAL FUNCTION PANEL
Albumin: 2.4 g/dL — ABNORMAL LOW (ref 3.5–5.0)
Anion gap: 4 — ABNORMAL LOW (ref 5–15)
BUN: 33 mg/dL — ABNORMAL HIGH (ref 8–23)
CO2: 27 mmol/L (ref 22–32)
Calcium: 9.1 mg/dL (ref 8.9–10.3)
Chloride: 107 mmol/L (ref 98–111)
Creatinine, Ser: 1.87 mg/dL — ABNORMAL HIGH (ref 0.44–1.00)
GFR, Estimated: 25 mL/min — ABNORMAL LOW (ref >60)
Glucose, Bld: 207 mg/dL — ABNORMAL HIGH (ref 70–99)
Phosphorus: 3.8 mg/dL (ref 2.5–4.6)
Potassium: 5.5 mmol/L — ABNORMAL HIGH (ref 3.5–5.1)
Sodium: 138 mmol/L (ref 135–145)

## 2022-04-11 LAB — MAGNESIUM: Magnesium: 1.5 mg/dL — ABNORMAL LOW (ref 1.7–2.4)

## 2022-04-11 MED ORDER — DEXTROSE 50 % IV SOLN
25.0000 mL | Freq: Once | INTRAVENOUS | Status: AC
  Administered 2022-04-11: 25 mL via INTRAVENOUS

## 2022-04-11 MED ORDER — DEXTROSE 50 % IV SOLN
INTRAVENOUS | Status: AC
  Filled 2022-04-11: qty 50

## 2022-04-11 NOTE — Progress Notes (Addendum)
Patient has not had BM documented since 5/29.  Bowel sounds x 4 quadrants, soft to palpation.  Patient refusing to take miralax or colace at this time.  Dr Duanne Limerick notified.

## 2022-04-11 NOTE — TOC Progression Note (Signed)
Transition of Care [facility]) - Progression Note    Patient Details  Name: [name] [name] MRN: [id number] Date of Birth: Mar 29, 1930  Transition of Care [facility]) CM/SW [place], [place] Phone Number: 04/11/2022, 12:36 PM  Clinical Narrative:     Plan is for patient to return to [facility] when medically ready for dc. CSW will continue to follow and assist with patients dc planning needs.  Expected Discharge Plan: [place]    Expected Discharge Plan and Services Expected Discharge Plan: [place] arrangements for the past 2 months: [place]                                       Social Determinants of Health (SDOH) Interventions    Readmission Risk Interventions    09/12/2021    2:32 PM  Readmission Risk Prevention Plan  Transportation Screening Complete  Medication Review [employer]) Complete  SW Recovery Care/Counseling Consult Complete  Palliative Care Screening Complete  Skilled Nursing Facility Complete

## 2022-04-11 NOTE — Care Management Important Message (Signed)
Important Message  Patient Details  Name: [name] [name] MRN: [id number] Date of Birth: 10-Jun-1930   Medicare Important Message Given:  Yes     [name] 04/11/2022, 8:52 AM

## 2022-04-11 NOTE — Progress Notes (Signed)
Hypoglycemic Event  CBG: 69  Treatment:  D50 25 mL (12.5 gm)  Symptoms: None  Follow-up CBG: [id number] CBG Result:115  Possible Reasons for Event: Inadequate meal intake  Comments/MD notified:Dr. Eustace Moore

## 2022-04-11 NOTE — Plan of Care (Signed)
  Problem: Clinical Measurements: Goal: Ability to maintain clinical measurements within normal limits will improve Outcome: Progressing   Problem: Clinical Measurements: Goal: Respiratory complications will improve Outcome: Progressing   Problem: Clinical Measurements: Goal: Cardiovascular complication will be avoided Outcome: Progressing   Problem: Activity: Goal: Risk for activity intolerance will decrease Outcome: Progressing   Problem: Nutrition: Goal: Adequate nutrition will be maintained Outcome: Progressing   Problem: Elimination: Goal: Will not experience complications related to bowel motility Outcome: Progressing   Problem: Pain Managment: Goal: General experience of comfort will improve Outcome: Progressing   Problem: Safety: Goal: Ability to remain free from injury will improve Outcome: Progressing   Problem: Skin Integrity: Goal: Risk for impaired skin integrity will decrease Outcome: Progressing

## 2022-04-11 NOTE — Progress Notes (Signed)
PROGRESS NOTE    [name] [name]  ONG:[id number] DOB: Mar 30, 1930 DOA: 04/04/2022  PCP: Pcp, No    Brief Narrative:  This 88 years old female with PMH significant for type 2 diabetes, hyperlipidemia, hypertension, CKD stage IV, dementia presented in the ED with complaints of altered mentation.  Patient was sent from [facility] skilled nursing facility.  Patient became bradycardic with a heart rate of 30s to 40s.  she was found to be hypothermic with a temp of 89.1 F, blood pressure 59/39,  lactic acid 3.7.  Patient was admitted for septic shock, initially intubated and admitted in ICU.  She subsequently extubated and started on IV antibiotics,  found to have UTI.  Hypothermia,  lactic acidosis and septic shock resolved.  Patient is on antibiotics.  PCCM pickup 04/07/2022.  04/10/2022: Patient seen.  Patient is a poor historian.  Potassium is elevated at 5.5.  We will start patient on [place].  No new complaints endorsed. 04/11/2022: Patient seen.  Patient has been refusing Lokelma as per the nursing staff.  Potassium remains at 5.5.  Patient is a poor historian.  Assessment & Plan:   Principal Problem:   Septic shock ([place])  Septic shock secondary to UTI; Patient presented with bradycardia, tachypnea, hypothermia, hypotension, lactic acid 3.7. UA positive for UTI Continue IV antibiotics. Sepsis physiology has resolved. Lactic acidosis resolved.  Hypothermia resolved. Complete course of antibiotics (ceftriaxone for 7 days) Last date 04/11/22.  Acute metabolic encephalopathy: Likely secondary to UTI.  Continue supportive care. Patient has in and out change in mental status likely delirium. Patient has hallucinations, anger outbursts. Psych recommended to continue Seroquel and  start Ativan bid.  Hyperkalemia: Potassium 6.3, patient was given D50, albuterol, insulin, Lokelma. K 5.3 .  Lokelma given . Recheck potassium. 04/10/2022: Potassium is 5.5 today.  Start patient on [place].  Continue to monitor  renal function and electrolytes. 04/11/2022: Potassium remains at 5.5.  Patient is refusing Lokelma for now.  We will continue to monitor potassium level.  Continue to encourage patient to encourage patient to comply with medications..  Bradycardia Normal sinus rhythm on  EKG.  Last echocardiogram shows LVEF 50 to 55%. Continue to monitor, Avoid metoprolol  Thrombocytopenia: Likely secondary to sepsis.  Continue supportive care No signs of bleeding. Platelets 93 K  Diabetes mellitus type 2: Continue regular insulin sliding scale.  Essential hypertension: Blood pressure improving Resume antiHTN medications.  AKI on CKD stage IV: Serum creatinine improving.  No indication for dialysis. Avoid nephrotoxic medications. Continue gentle hydration. 04/10/2022: AKI slowly resolving.  Serum creatinine is down to 1.88.  DNR: Continue ongoing goals of care discussions.  Generalized weakness: Could be secondary to sepsis.  PT and OT evaluation PT: SNF  DVT prophylaxis: heparin sq Code Status: DNR Family Communication: No family at bed side. Disposition Plan:  Status is: Inpatient Remains inpatient appropriate because:  Admitted for septic shock secondary to UTI and altered mentation,  intubated and then subsequently extubated.  Patient is on antibiotics.  Anticipated discharge to SNF 04/11/22     Consultants:  PCCM Cardiology  Procedures:  Intubated /Extubated.  Antimicrobials:  Anti-infectives (From admission, onward)    Start     Dose/Rate Route Frequency Ordered Stop   04/05/22 0145  cefTRIAXone (ROCEPHIN) 2 g in sodium chloride 0.9 % 100 mL IVPB        2 g 200 mL/hr over 30 Minutes Intravenous Every 24 hours 04/05/22 0144 04/11/22 0100        Subjective:  No new complaints today.  Objective: Vitals:   04/11/22 0100 04/11/22 0347 04/11/22 0829 04/11/22 1652  BP:  122/69 137/62 136/80  Pulse: 61 (!) 50 (!) 54 60  Resp: 15 15 20 19   Temp: (!) 97.2 F (36.2 C) (!) 96.4  F (35.8 C) 97.6 F (36.4 C) 97.9 F (36.6 C)  TempSrc: Axillary Axillary Oral Oral  SpO2: 97% 97% 93% 95%  Weight:      Height:        Intake/Output Summary (Last 24 hours) at 04/11/2022 1910 Last data filed at 04/11/2022 1800 Gross per 24 hour  Intake 600 ml  Output 1600 ml  Net -1000 ml    Filed Weights   04/08/22 0330 04/09/22 0403 04/10/22 0413  Weight: 98.7 kg 95.7 kg 93.9 kg    Examination:  General exam: Patient is awake and alert.  Patient is not in any distress.   Respiratory system: CTA bilaterally Cardiovascular system: S1-S2 heard. Gastrointestinal system: Abdomen is soft, non tender, non distended, BS+ Central nervous system: Alert and oriented x 2 . No focal neurological deficits. Extremities: No edema  Data Reviewed: I have personally reviewed following labs and imaging studies  CBC: Recent Labs  Lab 04/04/22 2318 04/05/22 0725 04/06/22 0505 04/10/22 0243  WBC 5.3 5.1 5.6 6.7  NEUTROABS 1.9  --   --   --   HGB 11.8* 9.6* 9.9* 10.4*  HCT 38.7 31.0* 31.5* 33.7*  MCV 100.8* 99.0 99.7 100.3*  PLT 81* 89* 96* 126*    Basic Metabolic Panel: Recent Labs  Lab 04/04/22 2318 04/05/22 0725 04/06/22 0505 04/06/22 1355 04/07/22 0634 04/07/22 1402 04/10/22 0243 04/11/22 0144  NA 140   < > 141 139 139  --  140 138  K 6.3*   < > 6.7* 6.0* 5.6* 5.3* 5.5* 5.5*  CL 109   < > 111 110 110  --  107 107  CO2 21*   < > 23 23 27   --  28 27  GLUCOSE 109*   < > 89 83 78  --  92 207*  BUN 84*   < > 65* 64* 53*  --  32* 33*  CREATININE 2.58*   < > 2.18* 2.04* 1.96*  --  1.88* 1.87*  CALCIUM 9.8   < > 9.3 9.5 9.6  --  9.6 9.1  MG 2.1  --  1.8  --   --   --  1.6* 1.5*  PHOS  --   --  4.0  --   --   --  4.0 3.8   < > = values in this interval not displayed.    GFR: Estimated Creatinine Clearance: 25.7 mL/min (A) (by C-G formula based on SCr of 1.87 mg/dL (H)). Liver Function Tests: Recent Labs  Lab 04/04/22 2318 04/11/22 0144  AST 23  --   ALT 28  --    ALKPHOS 65  --   BILITOT 0.4  --   PROT 7.1  --   ALBUMIN 3.4* 2.4*    No results for input(s): LIPASE, AMYLASE in the last 168 hours. No results for input(s): AMMONIA in the last 168 hours. Coagulation Profile: Recent Labs  Lab 04/05/22 0015  INR 1.1    Cardiac Enzymes: No results for input(s): CKTOTAL, CKMB, CKMBINDEX, TROPONINI in the last 168 hours. BNP (last 3 results) No results for input(s): PROBNP in the last 8760 hours. HbA1C: No results for input(s): HGBA1C in the last 72 hours.  CBG: Recent Labs  Lab 04/11/22 0750 04/11/22 1201 04/11/22 1654 04/11/22 1745 04/11/22 1814  GLUCAP 90 88 70 69* 115*    Lipid Profile: No results for input(s): CHOL, HDL, LDLCALC, TRIG, CHOLHDL, LDLDIRECT in the last 72 hours. Thyroid Function Tests: No results for input(s): TSH, T4TOTAL, FREET4, T3FREE, THYROIDAB in the last 72 hours. Anemia Panel: No results for input(s): VITAMINB12, FOLATE, FERRITIN, TIBC, IRON, RETICCTPCT in the last 72 hours. Sepsis Labs: Recent Labs  Lab 04/04/22 2318 04/05/22 0251 04/05/22 0725  PROCALCITON  --   --  0.30  LATICACIDVEN 2.2* 3.7* 2.0*     Recent Results (from the past 240 hour(s))  Culture, blood (routine x 2)     Status: None   Collection Time: 04/04/22 12:15 AM   Specimen: BLOOD RIGHT HAND  Result Value Ref Range Status   Specimen Description BLOOD RIGHT HAND  Final   Special Requests   Final    BOTTLES DRAWN AEROBIC AND ANAEROBIC Blood Culture results may not be optimal due to an inadequate volume of blood received in culture bottles   Culture   Final    NO GROWTH 5 DAYS Performed at [facility], [place] [address], [place], [place] [address]    Report Status 04/10/2022 FINAL  Final  Urine Culture     Status: Abnormal   Collection Time: 04/04/22 11:54 PM   Specimen: In/Out Cath Urine  Result Value Ref Range Status   Specimen Description IN/OUT CATH URINE  Final   Special Requests   Final    NONE Performed at [facility], [place] [address], [place], [place] [address]    Culture >=100,000 COLONIES/mL CITROBACTER FREUNDII (A)  Final   Report Status 04/07/2022 FINAL  Final   Organism ID, Bacteria CITROBACTER FREUNDII (A)  Final      Susceptibility   Citrobacter freundii - MIC*    CEFAZOLIN >=64 RESISTANT Resistant     CEFEPIME <=0.12 SENSITIVE Sensitive     CEFTRIAXONE <=0.25 SENSITIVE Sensitive     CIPROFLOXACIN <=0.25 SENSITIVE Sensitive     GENTAMICIN <=1 SENSITIVE Sensitive     IMIPENEM 0.5 SENSITIVE Sensitive     NITROFURANTOIN <=16 SENSITIVE Sensitive     TRIMETH/SULFA <=20 SENSITIVE Sensitive     PIP/TAZO <=4 SENSITIVE Sensitive     * >=100,000 COLONIES/mL CITROBACTER FREUNDII  Culture, blood (routine x 2)     Status: None   Collection Time: 04/05/22 12:37 AM   Specimen: BLOOD RIGHT ARM  Result Value Ref Range Status   Specimen Description BLOOD RIGHT ARM  Final   Special Requests   Final    BOTTLES DRAWN AEROBIC AND ANAEROBIC Blood Culture adequate volume   Culture   Final    NO GROWTH 5 DAYS Performed at [facility], [address], [place],  [address]    Report Status 04/10/2022 FINAL  Final  MRSA Next Gen by PCR, Nasal     Status: None   Collection Time: 04/05/22  9:00 AM   Specimen: Nasal Mucosa; Nasal Swab  Result Value Ref Range Status   MRSA by PCR Next Gen NOT DETECTED NOT DETECTED Final    Comment: (NOTE) The GeneXpert MRSA Assay (FDA approved for NASAL specimens only), is one component of a comprehensive MRSA colonization surveillance program. It is not intended to diagnose MRSA infection nor to guide or monitor treatment for MRSA infections. Test performance is not FDA approved in patients less than 74 years old. Performed at [facility]  Lab, [address] [place], [place] [address]      Radiology Studies: No results found.  Scheduled Meds:  chlorhexidine  15 mL Mouth Rinse BID   heparin  5,000 Units Subcutaneous Q8H   insulin aspart   0-15 Units Subcutaneous Q4H   LORazepam  1 mg Oral BID   Or   LORazepam  1 mg Intravenous BID   mouth rinse  15 mL Mouth Rinse q12n4p   melatonin  3 mg Oral QHS   pantoprazole  40 mg Oral Daily   QUEtiapine  25 mg Oral Daily   sodium zirconium cyclosilicate  10 g Oral BID   Continuous Infusions:  sodium chloride 10 mL/hr at 04/06/22 1000     LOS: 6 days    Time spent: 35 mins   Bonnell Public, MD [facility]   If 7PM-7AM, please contact night-coverage

## 2022-04-12 DIAGNOSIS — E875 Hyperkalemia: Secondary | ICD-10-CM

## 2022-04-12 DIAGNOSIS — R6521 Severe sepsis with septic shock: Secondary | ICD-10-CM | POA: Diagnosis not present

## 2022-04-12 DIAGNOSIS — F03911 Unspecified dementia, unspecified severity, with agitation: Secondary | ICD-10-CM

## 2022-04-12 DIAGNOSIS — A419 Sepsis, unspecified organism: Secondary | ICD-10-CM | POA: Diagnosis not present

## 2022-04-12 DIAGNOSIS — A498 Other bacterial infections of unspecified site: Secondary | ICD-10-CM

## 2022-04-12 LAB — GLUCOSE, CAPILLARY
Glucose-Capillary: 113 mg/dL — ABNORMAL HIGH (ref 70–99)
Glucose-Capillary: 147 mg/dL — ABNORMAL HIGH (ref 70–99)
Glucose-Capillary: 52 mg/dL — ABNORMAL LOW (ref 70–99)
Glucose-Capillary: 62 mg/dL — ABNORMAL LOW (ref 70–99)
Glucose-Capillary: 81 mg/dL (ref 70–99)
Glucose-Capillary: 81 mg/dL (ref 70–99)
Glucose-Capillary: 81 mg/dL (ref 70–99)
Glucose-Capillary: 85 mg/dL (ref 70–99)
Glucose-Capillary: 87 mg/dL (ref 70–99)
Glucose-Capillary: 90 mg/dL (ref 70–99)

## 2022-04-12 LAB — RENAL FUNCTION PANEL
Albumin: 2.9 g/dL — ABNORMAL LOW (ref 3.5–5.0)
Anion gap: 6 (ref 5–15)
BUN: 35 mg/dL — ABNORMAL HIGH (ref 8–23)
CO2: 26 mmol/L (ref 22–32)
Calcium: 9.5 mg/dL (ref 8.9–10.3)
Chloride: 105 mmol/L (ref 98–111)
Creatinine, Ser: 1.79 mg/dL — ABNORMAL HIGH (ref 0.44–1.00)
GFR, Estimated: 27 mL/min — ABNORMAL LOW (ref >60)
Glucose, Bld: 88 mg/dL (ref 70–99)
Phosphorus: 4.1 mg/dL (ref 2.5–4.6)
Potassium: 6.3 mmol/L (ref 3.5–5.1)
Sodium: 137 mmol/L (ref 135–145)

## 2022-04-12 LAB — POTASSIUM
Potassium: 6.2 mmol/L — ABNORMAL HIGH (ref 3.5–5.1)
Potassium: 5.9 mmol/L — ABNORMAL HIGH (ref 3.5–5.1)
Potassium: 5.7 mmol/L — ABNORMAL HIGH (ref 3.5–5.1)
Potassium: 6 mmol/L — ABNORMAL HIGH (ref 3.5–5.1)

## 2022-04-12 LAB — GLUCOSE, RANDOM: Glucose, Bld: 71 mg/dL (ref 70–99)

## 2022-04-12 MED ORDER — SODIUM POLYSTYRENE SULFONATE 15 GM/60ML PO SUSP
15.0000 g | Freq: Once | ORAL | Status: DC
  Filled 2022-04-12: qty 60

## 2022-04-12 MED ORDER — DEXTROSE 10 % IV SOLN
Freq: Once | INTRAVENOUS | Status: AC
Start: 2022-04-12 — End: 2022-04-12

## 2022-04-12 MED ORDER — DIVALPROEX SODIUM 125 MG PO CSDR
250.0000 mg | DELAYED_RELEASE_CAPSULE | Freq: Three times a day (TID) | ORAL | Status: DC
  Administered 2022-04-12 – 2022-04-15 (×7): 250 mg via ORAL
  Filled 2022-04-12 (×13): qty 2

## 2022-04-12 MED ORDER — INSULIN ASPART 100 UNIT/ML IV SOLN
5.0000 [IU] | Freq: Once | INTRAVENOUS | Status: AC
  Administered 2022-04-12: 5 [IU] via INTRAVENOUS

## 2022-04-12 MED ORDER — INSULIN ASPART 100 UNIT/ML IV SOLN: 5.0000 [IU] | Freq: Once | INTRAVENOUS | Status: DC

## 2022-04-12 MED ORDER — DEXTROSE 10 % IV SOLN: INTRAVENOUS | Status: AC

## 2022-04-12 MED ORDER — OLANZAPINE 5 MG PO TABS: 5.0000 mg | ORAL_TABLET | Freq: Two times a day (BID) | ORAL | Status: DC | PRN

## 2022-04-12 MED ORDER — SODIUM ZIRCONIUM CYCLOSILICATE 10 G PO PACK
10.0000 g | PACK | Freq: Every day | ORAL | Status: DC
  Administered 2022-04-12: 10 g via ORAL
  Filled 2022-04-12: qty 1

## 2022-04-12 MED ORDER — ATORVASTATIN CALCIUM 10 MG PO TABS
20.0000 mg | ORAL_TABLET | Freq: Every day | ORAL | Status: DC
  Administered 2022-04-14 – 2022-04-18 (×4): 20 mg via ORAL
  Filled 2022-04-12 (×5): qty 2

## 2022-04-12 MED ORDER — QUETIAPINE FUMARATE 50 MG PO TABS
50.0000 mg | ORAL_TABLET | Freq: Every day | ORAL | Status: DC
  Administered 2022-04-12 – 2022-04-14 (×3): 50 mg via ORAL
  Filled 2022-04-12 (×3): qty 1

## 2022-04-12 MED ORDER — DEXTROSE 50 % IV SOLN
1.0000 | Freq: Once | INTRAVENOUS | Status: DC
  Filled 2022-04-12: qty 50

## 2022-04-12 MED ORDER — SODIUM ZIRCONIUM CYCLOSILICATE 10 G PO PACK
10.0000 g | PACK | Freq: Every day | ORAL | Status: AC
  Administered 2022-04-12 – 2022-04-13 (×2): 10 g via ORAL
  Filled 2022-04-12 (×2): qty 1

## 2022-04-12 MED ORDER — SODIUM ZIRCONIUM CYCLOSILICATE 10 G PO PACK
10.0000 g | PACK | Freq: Two times a day (BID) | ORAL | Status: AC
  Administered 2022-04-12: 10 g via ORAL
  Filled 2022-04-12 (×2): qty 1

## 2022-04-12 MED ORDER — DEXTROSE 50 % IV SOLN
1.0000 | Freq: Once | INTRAVENOUS | Status: AC
  Administered 2022-04-12: 50 mL via INTRAVENOUS
  Filled 2022-04-12: qty 50

## 2022-04-12 MED ORDER — INSULIN ASPART 100 UNIT/ML IV SOLN
5.0000 [IU] | Freq: Once | INTRAVENOUS | Status: AC
Start: 2022-04-12 — End: 2022-04-12
  Administered 2022-04-12: 5 [IU] via INTRAVENOUS

## 2022-04-12 MED ORDER — DEXTROSE 50 % IV SOLN
1.0000 | Freq: Once | INTRAVENOUS | Status: AC
Start: 2022-04-12 — End: 2022-04-12
  Administered 2022-04-12: 50 mL via INTRAVENOUS
  Filled 2022-04-12: qty 50

## 2022-04-12 NOTE — Consult Note (Signed)
[facility] Face-to-Face Psychiatry Consult   Reason for Consult:  ''Dementia with agitation''. Referring Physician:  Antonieta Pert, MD Patient Identification: [name] [name] MRN:  [id number] Principal Diagnosis: Septic shock ([place]) Diagnosis:  Principal Problem:   Septic shock ([place]) Active Problems:   Hyperlipidemia   Acute lower UTI   Anemia   Acute metabolic encephalopathy   Dementia with behavioral disturbance   Hyperkalemia   Citrobacter infection   Total Time spent with patient: 1 hour  Subjective:   [name] [name] is a 88 y.o. female patient admitted with altered mental status.  HPI: Patient is a  88 y/o female  with T2DM, HLD, HTN, CKD stage IV, Dementia with behavior changes, DNR who presents to the hospital with altered mental status. Per chart review, patient became bradycardic with heart rate in 30s to 40s hypothermic hypotensive with lactic acidosis admitted for septic shock initially intubated and admitted to ICU subsequently extubated managed IV antibiotics for UTI, sepsis resolved and was stepped down to medical floor. However, psychiatric consult was activated due to agitation in a patient with dementia who was recently treated for sepsis. Today, patient is alert, calm and cooperative. She answered most of the question appropriately. She seems to be showing favorable response to her current medications. She denies psychosis, delusions, depression and self harming thoughts. She has minimal insight into her medical issues, she thinks she is stable enough for home discharge.  Past Psychiatric History: as above  Risk to Self:  denies Risk to Others:  denies Prior Inpatient Therapy:  none for mental illness in the past Prior Outpatient Therapy:    Past Medical History:  Past Medical History:  Diagnosis Date   Diabetes mellitus    Hyperlipidemia    Hypertension     Past Surgical History:  Procedure Laterality Date   APPENDECTOMY     CHOLECYSTECTOMY     COLONOSCOPY  06/23/2012    Procedure: COLONOSCOPY;  Surgeon: Lear Ng, MD;  Location: [facility];  Service: Endoscopy;  Laterality: N/A;   ESOPHAGOGASTRODUODENOSCOPY  06/23/2012   Procedure: ESOPHAGOGASTRODUODENOSCOPY (EGD);  Surgeon: Lear Ng, MD;  Location: [facility] ENDOSCOPY;  Service: Endoscopy;  Laterality: N/A;   Family History: History reviewed. No pertinent family history. Family Psychiatric  History:  Social History:  Social History   Substance and Sexual Activity  Alcohol Use No     Social History   Substance and Sexual Activity  Drug Use No    Social History   Socioeconomic History   Marital status: Widowed    Spouse name: Not on file   Number of children: Not on file   Years of education: Not on file   Highest education level: Not on file  Occupational History   Not on file  Tobacco Use   Smoking status: Never   Smokeless tobacco: Never  Vaping Use   Vaping Use: Never used  Substance and Sexual Activity   Alcohol use: No   Drug use: No   Sexual activity: Not on file  Other Topics Concern   Not on file  Social History Narrative   Not on file   Social Determinants of Health   Financial Resource Strain: Not on file  Food Insecurity: Not on file  Transportation Needs: Not on file  Physical Activity: Not on file  Stress: Not on file  Social Connections: Not on file   Additional Social History:    Allergies:   Allergies  Allergen Reactions   Alendronate     Other reaction(s):  Other (See Comments) Poor renal function.    Labs:  Results for orders placed or performed during the hospital encounter of 04/04/22 (from the past 48 hour(s))  Glucose, capillary     Status: None   Collection Time: 04/10/22  4:31 PM  Result Value Ref Range   Glucose-Capillary 91 70 - 99 mg/dL    Comment: Glucose reference range applies only to samples taken after fasting for at least 8 hours.  Glucose, capillary     Status: None   Collection Time: 04/10/22 10:23 PM  Result Value  Ref Range   Glucose-Capillary 90 70 - 99 mg/dL    Comment: Glucose reference range applies only to samples taken after fasting for at least 8 hours.  Glucose, capillary     Status: None   Collection Time: 04/11/22  1:08 AM  Result Value Ref Range   Glucose-Capillary 70 70 - 99 mg/dL    Comment: Glucose reference range applies only to samples taken after fasting for at least 8 hours.  Renal function panel     Status: Abnormal   Collection Time: 04/11/22  1:44 AM  Result Value Ref Range   Sodium 138 135 - 145 mmol/L   Potassium 5.5 (H) 3.5 - 5.1 mmol/L   Chloride 107 98 - 111 mmol/L   CO2 27 22 - 32 mmol/L   Glucose, Bld 207 (H) 70 - 99 mg/dL    Comment: Glucose reference range applies only to samples taken after fasting for at least 8 hours.   BUN 33 (H) 8 - 23 mg/dL   Creatinine, Ser 1.87 (H) 0.44 - 1.00 mg/dL   Calcium 9.1 8.9 - 10.3 mg/dL   Phosphorus 3.8 2.5 - 4.6 mg/dL   Albumin 2.4 (L) 3.5 - 5.0 g/dL   GFR, Estimated 25 (L) >60 mL/min    Comment: (NOTE) Calculated using the CKD-EPI Creatinine Equation (2021)    Anion gap 4 (L) 5 - 15    Comment: Performed at [facility] [address], [place], [place] [address]  Magnesium     Status: Abnormal   Collection Time: 04/11/22  1:44 AM  Result Value Ref Range   Magnesium 1.5 (L) 1.7 - 2.4 mg/dL    Comment: Performed at [facility] [address] [place],  [address]  Glucose, capillary     Status: None   Collection Time: 04/11/22  3:50 AM  Result Value Ref Range   Glucose-Capillary 77 70 - 99 mg/dL    Comment: Glucose reference range applies only to samples taken after fasting for at least 8 hours.  Glucose, capillary     Status: None   Collection Time: 04/11/22  7:50 AM  Result Value Ref Range   Glucose-Capillary 90 70 - 99 mg/dL    Comment: Glucose reference range applies only to samples taken after fasting for at least 8 hours.  Glucose, capillary     Status: None   Collection Time: 04/11/22 12:01  PM  Result Value Ref Range   Glucose-Capillary 88 70 - 99 mg/dL    Comment: Glucose reference range applies only to samples taken after fasting for at least 8 hours.  Glucose, capillary     Status: None   Collection Time: 04/11/22  4:54 PM  Result Value Ref Range   Glucose-Capillary 70 70 - 99 mg/dL    Comment: Glucose reference range applies only to samples taken after fasting for at least 8 hours.  Glucose, capillary  Status: Abnormal   Collection Time: 04/11/22  5:45 PM  Result Value Ref Range   Glucose-Capillary 69 (L) 70 - 99 mg/dL    Comment: Glucose reference range applies only to samples taken after fasting for at least 8 hours.  Glucose, capillary     Status: Abnormal   Collection Time: 04/11/22  6:14 PM  Result Value Ref Range   Glucose-Capillary 115 (H) 70 - 99 mg/dL    Comment: Glucose reference range applies only to samples taken after fasting for at least 8 hours.  Glucose, capillary     Status: Abnormal   Collection Time: 04/11/22  7:51 PM  Result Value Ref Range   Glucose-Capillary 110 (H) 70 - 99 mg/dL    Comment: Glucose reference range applies only to samples taken after fasting for at least 8 hours.  Glucose, capillary     Status: None   Collection Time: 04/12/22 12:03 AM  Result Value Ref Range   Glucose-Capillary 81 70 - 99 mg/dL    Comment: Glucose reference range applies only to samples taken after fasting for at least 8 hours.  Renal function panel     Status: Abnormal   Collection Time: 04/12/22  2:19 AM  Result Value Ref Range   Sodium 137 135 - 145 mmol/L   Potassium 6.3 (HH) 3.5 - 5.1 mmol/L    Comment: SLIGHT HEMOLYSIS CRITICAL RESULT CALLED TO, READ BACK BY AND VERIFIED WITH: MAGAT J,RN 04/12/22 0339 WAYK    Chloride 105 98 - 111 mmol/L   CO2 26 22 - 32 mmol/L   Glucose, Bld 88 70 - 99 mg/dL    Comment: Glucose reference range applies only to samples taken after fasting for at least 8 hours.   BUN 35 (H) 8 - 23 mg/dL   Creatinine, Ser 1.79  (H) 0.44 - 1.00 mg/dL   Calcium 9.5 8.9 - 10.3 mg/dL   Phosphorus 4.1 2.5 - 4.6 mg/dL   Albumin 2.9 (L) 3.5 - 5.0 g/dL   GFR, Estimated 27 (L) >60 mL/min    Comment: (NOTE) Calculated using the CKD-EPI Creatinine Equation (2021)    Anion gap 6 5 - 15    Comment: Performed at [facility] [address], [place], [place] [address]  Glucose, capillary     Status: None   Collection Time: 04/12/22  3:48 AM  Result Value Ref Range   Glucose-Capillary 85 70 - 99 mg/dL    Comment: Glucose reference range applies only to samples taken after fasting for at least 8 hours.  Potassium     Status: Abnormal   Collection Time: 04/12/22  4:15 AM  Result Value Ref Range   Potassium 6.2 (H) 3.5 - 5.1 mmol/L    Comment: SLIGHT HEMOLYSIS Performed at [facility] [address] [place], [place] [address]   Glucose, capillary     Status: None   Collection Time: 04/12/22  5:18 AM  Result Value Ref Range   Glucose-Capillary 90 70 - 99 mg/dL    Comment: Glucose reference range applies only to samples taken after fasting for at least 8 hours.  Glucose, capillary     Status: None   Collection Time: 04/12/22  6:10 AM  Result Value Ref Range   Glucose-Capillary 81 70 - 99 mg/dL    Comment: Glucose reference range applies only to samples taken after fasting for at least 8 hours.  Potassium     Status: Abnormal   Collection Time: 04/12/22 10:32 AM  Result Value Ref Range   Potassium 5.9 (H) 3.5 - 5.1 mmol/L    Comment: DELTA CHECK NOTED SLIGHT HEMOLYSIS Performed at [facility], [place] [address], [place], [place] [address]   Glucose, capillary     Status: None   Collection Time: 04/12/22 11:35 AM  Result Value Ref Range   Glucose-Capillary 81 70 - 99 mg/dL    Comment: Glucose reference range applies only to samples taken after fasting for at least 8 hours.    Current Facility-Administered Medications  Medication Dose Route Frequency Provider Last Rate Last Admin   0.9 %  sodium  chloride infusion  250 mL Intravenous Continuous Estill Cotta, NP 10 mL/hr at 04/06/22 1000 Infusion Verify at 04/06/22 1000   acetaminophen (TYLENOL) tablet 650 mg  650 mg Oral Q6H PRN Elsie Lincoln, MD   650 mg at 04/06/22 2349   atorvastatin (LIPITOR) tablet 20 mg  20 mg Oral Daily Kc, Ramesh, MD       chlorhexidine (PERIDEX) 0.12 % solution 15 mL  15 mL Mouth Rinse BID Candee Furbish, MD   15 mL at 04/11/22 2202   insulin aspart (novoLOG) injection 5 Units  5 Units Intravenous Once Kc, Ramesh, MD       And   dextrose 50 % solution 50 mL  1 ampule Intravenous Once Kc, Ramesh, MD       divalproex (DEPAKOTE SPRINKLE) capsule 250 mg  250 mg Oral TID Kc, Ramesh, MD       docusate sodium (COLACE) capsule 100 mg  100 mg Oral BID PRN Estill Cotta, NP   100 mg at 04/12/22 0607   heparin injection 5,000 Units  5,000 Units Subcutaneous Q8H Estill Cotta, NP   5,000 Units at 04/12/22 0526   insulin aspart (novoLOG) injection 0-15 Units  0-15 Units Subcutaneous Q4H Estill Cotta, NP   2 Units at 04/07/22 1213   LORazepam (ATIVAN) tablet 1 mg  1 mg Oral BID Merrily Brittle, DO   1 mg at 04/11/22 2207   Or   LORazepam (ATIVAN) injection 1 mg  1 mg Intravenous BID Merrily Brittle, DO   1 mg at 04/12/22 3016   MEDLINE mouth rinse  15 mL Mouth Rinse q12n4p Candee Furbish, MD   15 mL at 04/12/22 1211   melatonin tablet 3 mg  3 mg Oral QHS Merrily Brittle, DO   3 mg at 04/11/22 2203   OLANZapine (ZYPREXA) tablet 5 mg  5 mg Oral BID PRN Corena Pilgrim, MD       pantoprazole (PROTONIX) EC tablet 40 mg  40 mg Oral Daily Shawna Clamp, MD   40 mg at 04/11/22 0844   polyethylene glycol (MIRALAX / GLYCOLAX) packet 17 g  17 g Oral Daily PRN Estill Cotta, NP       QUEtiapine (SEROQUEL) tablet 50 mg  50 mg Oral Daily Kramer Hanrahan, MD       sodium zirconium cyclosilicate (LOKELMA) packet 10 g  10 g Oral Daily Kc, Ramesh, MD   10 g at 04/12/22 1210    Musculoskeletal: Strength & Muscle Tone:  within normal limits Gait & Station:  not tested Patient leans: N/A    Psychiatric Specialty Exam:  Presentation  General Appearance: Appropriate for Environment  Eye Contact:Good  Speech:Clear and Coherent  Speech Volume:Normal  Handedness:Right   Mood and Affect  Mood:Euthymic  Affect:Appropriate   Thought Process  Thought Processes:Linear  Descriptions of Associations:Intact  Orientation:-- (oriented  to place)  Thought Content:Logical  History of Schizophrenia/Schizoaffective disorder:No data recorded Duration of Psychotic Symptoms:No data recorded Hallucinations:Hallucinations: None  Ideas of Reference:None  Suicidal Thoughts:Suicidal Thoughts: No  Homicidal Thoughts:Homicidal Thoughts: No   Sensorium  Memory:Immediate Fair; Recent Poor; Remote Poor  Judgment:Impaired  Insight:Lacking   Executive Functions  Concentration:Fair  Attention Span:Fair  Recall:Poor  Fund of Knowledge:Poor  Language:Good   Psychomotor Activity  Psychomotor Activity:Psychomotor Activity: Normal   Assets  Assets:Social Support   Sleep  Sleep:Sleep: Fair   Physical Exam: Physical Exam ROS Blood pressure 136/80, pulse 73, temperature 97.8 F (36.6 C), temperature source Axillary, resp. rate 18, height 6' (1.829 m), weight 91.2 kg, SpO2 100 %. Body mass index is 27.27 kg/m.  Treatment Plan Summary:  ## Safety and Observation Level:  - Based on my clinical evaluation, I estimate the patient to be at no or low risk of self harm in the current setting - At this time, we recommend a frequent level of observation with delirium, and fall precautions. This decision is based on my review of the chart including patient's history and current presentation, interview of the patient, mental status examination, and consideration of suicide risk including self-harm behaviors, risk factors, and protective factors. This judgment is based on our ability to directly  address suicide risk, implement suicide prevention strategies and develop a safety plan while the patient is in the clinical setting. Please contact our team if there is a concern that risk level has changed.   ## Medications:  -- Increase Seroquel from 25 mg to 50 mg at bedtime for agitation ---I recommend to gradually wean patient of Lorazepam 1 mg, oral/injection twice daily for agitation. ---Add Olanzapine 5 mg twice daily for agitation/psychosis -- Continue Melatonin 3 mg, oral, at bedtime for insomnia. -- Continue Depakote Sprinkle 250 mg three time daily for mood/agitation   ## Medical Decision Making Capacity:  Not formally assessed    ## Further Work-up:  -- most recent EKG on 5/26 had QtC of 404, pls consider EKG monitoring to avoid QTc prolongation   ## Disposition:  -- Patient can return to her SNF once medically clear.   Thank you for this consult request.    Disposition: Patient does not meet criteria for psychiatric inpatient admission. Psychiatric service signing off. Re-consult as needed  Corena Pilgrim, MD 04/12/2022 2:21 PM

## 2022-04-12 NOTE — Progress Notes (Addendum)
K=6.3, slightly hemolyzed specimen per lab. Will recollect. Will follow up. Informed T. Opyd, MD.

## 2022-04-12 NOTE — Progress Notes (Signed)
Potassium is back up to 6.0. Plan to check EKG and treat with insulin/dextrose and kayexalate. Consider renal diet for low-potassium.

## 2022-04-12 NOTE — Evaluation (Signed)
Occupational Therapy Evaluation Patient Details Name: [name] [name] MRN: [id number] DOB: 14-Sep-1930 Today's Date: 04/12/2022   History of Present Illness 88 y.o. female, who presented to the [facility] ED with a chief complaint of AMS.  PMH includes: DM2, HLD, HTN, CKD 4, dementia   Clinical Impression   Patient admitted for the above diagnosis.  Prior level of function and living arrangement is difficult to ascertain based on patient cognitive status and conflicting statements.  OT will assume, per the chart, that the patient currently resides at a local SNF , with assist as needed for ADL completion and toileting.  Patient does state she wants to return home, and that she has adequate oversight and assist at home.  OT will defer to case management with regards to support at home.  OT would recommend either SNF for long term care, or home with 24 hour Mod to Max A for ADL and toileting.  If family in fact can provide this level of care, then CM can arrange with family.  Patient stating that contacts listed in her chart are currently in prison.  No significant OT needs identified in the acute setting.  OT encourages up for meals and assist as needed for ADL completion.  Deficits listed below.           Recommendations for follow up therapy are one component of a multi-disciplinary discharge planning process, led by the attending physician.  Recommendations may be updated based on patient status, additional functional criteria and insurance authorization.   Follow Up Recommendations  Skilled nursing-short term rehab (<3 hours/day)    Assistance Recommended at Discharge Frequent or constant Supervision/Assistance  Patient can return home with the following A lot of help with walking and/or transfers;A lot of help with bathing/dressing/bathroom;Assistance with cooking/housework;Help with stairs or ramp for entrance;Assist for transportation;Direct supervision/assist for financial management;Direct  supervision/assist for medications management    Functional Status Assessment  Patient has had a recent decline in their functional status and demonstrates the ability to make significant improvements in function in a reasonable and predictable amount of time.  Equipment Recommendations  Wheelchair (measurements OT);Wheelchair cushion (measurements OT)    Recommendations for Other Services       Precautions / Restrictions Precautions Precautions: Fall Restrictions Weight Bearing Restrictions: No      Mobility Bed Mobility Overal bed mobility: Needs Assistance Bed Mobility: Sidelying to Sit   Sidelying to sit: Min guard, HOB elevated            Transfers Overall transfer level: Needs assistance   Transfers: Sit to/from Stand, Bed to chair/wheelchair/BSC Sit to Stand: Min assist, +2 physical assistance     Step pivot transfers: Min assist, +2 physical assistance            Balance Overall balance assessment: Needs assistance Sitting-balance support: Feet unsupported Sitting balance-Leahy Scale: Fair     Standing balance support: Reliant on assistive device for balance Standing balance-Leahy Scale: Poor                             ADL either performed or assessed with clinical judgement   ADL       Grooming: Wash/dry hands;Wash/dry face;Set up;Sitting           Upper Body Dressing : Moderate assistance;Sitting   Lower Body Dressing: Maximal assistance;Sit to/from stand   Toilet Transfer: Minimal assistance;Rolling walker (2 wheels);+2 for physical assistance  Vision Baseline Vision/History: 1 Wears glasses Patient Visual Report: No change from baseline       Perception Perception Perception: Not tested   Praxis Praxis Praxis: Not tested    Pertinent Vitals/Pain Pain Assessment Pain Assessment: No/denies pain     Hand Dominance Right   Extremity/Trunk Assessment Upper Extremity Assessment Upper  Extremity Assessment: RUE deficits/detail;LUE deficits/detail RUE Deficits / Details: groosly 3+/5 RUE Sensation: WNL RUE Coordination: WNL LUE Deficits / Details: limited shoulder AROM LUE Sensation: WNL LUE Coordination: WNL       Cervical / Trunk Assessment Cervical / Trunk Assessment: Kyphotic   Communication Communication Communication: No difficulties;HOH   Cognition Arousal/Alertness: Awake/alert Behavior During Therapy: WFL for tasks assessed/performed Overall Cognitive Status: History of cognitive impairments - at baseline Area of Impairment: Orientation, Memory, Following commands, Safety/judgement, Awareness, Problem solving                 Orientation Level: Person, Place   Memory: Decreased short-term memory, Decreased recall of precautions Following Commands: Follows one step commands with increased time Safety/Judgement: Decreased awareness of safety, Decreased awareness of deficits   Problem Solving: Requires verbal cues, Requires tactile cues       General Comments   VSS on RA    Exercises     Shoulder Instructions      Home Living Family/patient expects to be discharged to:: Skilled nursing facility                                 Additional Comments: Por historian, unclear regarding PLOF and what support she would have at home.  Chart suggests she resides at a local SNF, patient wants to return home.      Prior Functioning/Environment Prior Level of Function : Needs assist  Cognitive Assist : Mobility (cognitive);ADLs (cognitive) Mobility (Cognitive): Intermittent cues ADLs (Cognitive): Intermittent cues       Mobility Comments: states she walks with staff short distances with RW ADLs Comments: pt unable to provide, however assume assist as needed at local SNF        OT Problem List: Decreased strength;Decreased activity tolerance;Impaired balance (sitting and/or standing);Decreased knowledge of use of DME or  AE;Decreased safety awareness;Decreased cognition      OT Treatment/Interventions:      OT Goals(Current goals can be found in the care plan section) Acute Rehab OT Goals Patient Stated Goal: Wants to return home OT Goal Formulation: With patient Time For Goal Achievement: 04/21/22 Potential to Achieve Goals: Fair  OT Frequency:      Co-evaluation              AM-PAC OT "6 Clicks" Daily Activity     Outcome Measure Help from another person eating meals?: A Little Help from another person taking care of personal grooming?: A Little Help from another person toileting, which includes using toliet, bedpan, or urinal?: A Lot Help from another person bathing (including washing, rinsing, drying)?: A Lot Help from another person to put on and taking off regular upper body clothing?: A Lot Help from another person to put on and taking off regular lower body clothing?: A Lot 6 Click Score: 14   End of Session Equipment Utilized During Treatment: Gait belt;Rolling walker (2 wheels) Nurse Communication: Mobility status  Activity Tolerance: Patient tolerated treatment well Patient left: in chair;with call bell/phone within reach;with chair alarm set  OT Visit Diagnosis: Unsteadiness on feet (R26.81);Other symptoms and signs  involving cognitive function                Time: 3539-1225 OT Time Calculation (min): 19 min Charges:  OT General Charges $OT Visit: 1 Visit OT Evaluation $OT Eval Moderate Complexity: 1 Mod  04/12/2022  RP, OTR/L  Acute Rehabilitation Services  Office:  [phone]   Metta Clines 04/12/2022, 12:50 PM

## 2022-04-12 NOTE — Evaluation (Signed)
Physical Therapy Evaluation Patient Details Name: [name] [name] MRN: [id number] DOB: Apr 12, 1930 Today's Date: 04/12/2022  History of Present Illness  88 y.o. female, who presented to the [facility] ED 5/26 with a chief complaint of AMS.  Positive for UTI, sepsis, VDRF.  PMH includes: DM2, HLD, HTN, CKD 4, dementia  Clinical Impression  Pt admitted with above diagnosis. Pt was able to ambulate with min assist  +2 for safety. Pt with overall poor safety awareness especially with transitons. Needs 24 hour care and agree with return to [facility].  Pt reported she had family that would let her stay with them.  Called both numbers that are on chart and no answer at either. Will follow acutely.  Pt currently with functional limitations due to the deficits listed below (see PT Problem List). Pt will benefit from skilled PT to increase their independence and safety with mobility to allow discharge to the venue listed below.          Recommendations for follow up therapy are one component of a multi-disciplinary discharge planning process, led by the attending physician.  Recommendations may be updated based on patient status, additional functional criteria and insurance authorization.  Follow Up Recommendations Skilled nursing-short term rehab (<3 hours/day)    Assistance Recommended at Discharge Frequent or constant Supervision/Assistance  Patient can return home with the following  A lot of help with walking and/or transfers;A lot of help with bathing/dressing/bathroom;Assistance with cooking/housework;Direct supervision/assist for medications management;Direct supervision/assist for financial management;Assist for transportation;Help with stairs or ramp for entrance    Equipment Recommendations None recommended by PT  Recommendations for Other Services       Functional Status Assessment Patient has had a recent decline in their functional status and demonstrates the ability to make significant improvements  in function in a reasonable and predictable amount of time.     Precautions / Restrictions Precautions Precautions: Fall Restrictions Weight Bearing Restrictions: No      Mobility  Bed Mobility Overal bed mobility: Needs Assistance Bed Mobility: Sidelying to Sit   Sidelying to sit: Min guard, HOB elevated            Transfers Overall transfer level: Needs assistance   Transfers: Sit to/from Stand, Bed to chair/wheelchair/BSC Sit to Stand: Min assist, +2 physical assistance   Step pivot transfers: Min assist, +2 physical assistance       General transfer comment: Needed min assist to power up.  Initially took incr time to get her balance    Ambulation/Gait Ambulation/Gait assistance: Min assist, +2 safety/equipment Gait Distance (Feet): 25 Feet Assistive device: Rolling walker (2 wheels) Gait Pattern/deviations: Shuffle, Knee flexed in stance - right, Knee flexed in stance - left, Decreased stride length, Leaning posteriorly, Drifts right/left, Trunk flexed   Gait velocity interpretation: <1.31 ft/sec, indicative of household ambulator   General Gait Details: Pt with knock knees. Able to take steps but needed constant cues and moves left foot better than right foot needing cues to take larger step with right foot.  Pt with forward flexed posture and shuffles feet as well. Needed cues to stay in RW and assist to not move RW too far with hand in front of the RW to keep it from going out too far.  Had 2 persons for safety as pt is unsteady.When pt got to chair, plopped and did not reach back.  Stairs            Wheelchair Mobility    Modified Rankin (Stroke Patients Only)  Balance Overall balance assessment: Needs assistance Sitting-balance support: Feet unsupported Sitting balance-Leahy Scale: Fair     Standing balance support: Reliant on assistive device for balance, During functional activity, Bilateral upper extremity supported Standing  balance-Leahy Scale: Poor Standing balance comment: relies on UE and external support                             Pertinent Vitals/Pain Pain Assessment Pain Assessment: No/denies pain Breathing: normal Negative Vocalization: none Facial Expression: smiling or inexpressive Body Language: relaxed Consolability: no need to console PAINAD Score: 0    Home Living Family/patient expects to be discharged to:: Skilled nursing facility                   Additional Comments: Poor historian, unclear regarding PLOF and what support she would have at home.  Chart suggests she resides at a local SNF, patient wants to return home.    Prior Function Prior Level of Function : Needs assist  Cognitive Assist : Mobility (cognitive);ADLs (cognitive) Mobility (Cognitive): Intermittent cues ADLs (Cognitive): Intermittent cues       Mobility Comments: states she walks with staff short distances with RW ADLs Comments: pt unable to provide, however assume assist as needed at local SNF     Hand Dominance   Dominant Hand: Right    Extremity/Trunk Assessment   Upper Extremity Assessment Upper Extremity Assessment: Defer to OT evaluation RUE Deficits / Details: groosly 3+/5 RUE Sensation: WNL RUE Coordination: WNL LUE Deficits / Details: limited shoulder AROM LUE Sensation: WNL LUE Coordination: WNL    Lower Extremity Assessment Lower Extremity Assessment: Generalized weakness    Cervical / Trunk Assessment Cervical / Trunk Assessment: Kyphotic  Communication   Communication: No difficulties;HOH  Cognition Arousal/Alertness: Awake/alert Behavior During Therapy: WFL for tasks assessed/performed Overall Cognitive Status: History of cognitive impairments - at baseline Area of Impairment: Orientation, Memory, Following commands, Safety/judgement, Awareness, Problem solving                 Orientation Level: Person, Place   Memory: Decreased short-term memory,  Decreased recall of precautions Following Commands: Follows one step commands with increased time Safety/Judgement: Decreased awareness of safety, Decreased awareness of deficits   Problem Solving: Requires verbal cues, Requires tactile cues          General Comments General comments (skin integrity, edema, etc.): VSS    Exercises General Exercises - Lower Extremity Ankle Circles/Pumps: AROM, Both, 10 reps, Supine Long Arc Quad: AROM, Both, 10 reps, Seated   Assessment/Plan    PT Assessment Patient needs continued PT services  PT Problem List Decreased range of motion;Decreased strength;Decreased activity tolerance;Decreased balance;Decreased mobility;Decreased knowledge of use of DME;Decreased safety awareness;Decreased knowledge of precautions;Cardiopulmonary status limiting activity       PT Treatment Interventions DME instruction;Gait training;Functional mobility training;Therapeutic activities;Therapeutic exercise;Balance training;Patient/family education    PT Goals (Current goals can be found in the Care Plan section)  Acute Rehab PT Goals Patient Stated Goal: to go home PT Goal Formulation: With patient Time For Goal Achievement: 04/26/22 Potential to Achieve Goals: Good    Frequency Min 2X/week     Co-evaluation PT/OT/SLP Co-Evaluation/Treatment: Yes Reason for Co-Treatment: Complexity of the patient's impairments (multi-system involvement);For patient/therapist safety PT goals addressed during session: Mobility/safety with mobility         AM-PAC PT "6 Clicks" Mobility  Outcome Measure Help needed turning from your back to your side while in a  flat bed without using bedrails?: A Little Help needed moving from lying on your back to sitting on the side of a flat bed without using bedrails?: A Little Help needed moving to and from a bed to a chair (including a wheelchair)?: Total Help needed standing up from a chair using your arms (e.g., wheelchair or bedside  chair)?: Total Help needed to walk in hospital room?: Total Help needed climbing 3-5 steps with a railing? : Total 6 Click Score: 10    End of Session Equipment Utilized During Treatment: Gait belt Activity Tolerance: Patient limited by fatigue Patient left: in chair;with call bell/phone within reach;with chair alarm set Nurse Communication: Mobility status PT Visit Diagnosis: Unsteadiness on feet (R26.81);Muscle weakness (generalized) (M62.81)    Time: 9432-7614 PT Time Calculation (min) (ACUTE ONLY): 30 min   Charges:   PT Evaluation $PT Eval Moderate Complexity: 1 Mod          Chemere Steffler M,PT Acute Rehab Services [phone] (pager)   Alvira Philips 04/12/2022, 2:18 PM

## 2022-04-12 NOTE — Progress Notes (Signed)
PROGRESS NOTE [name] [name]  SEG:[id number] DOB: [birth year] DOA: 04/04/2022 PCP: Pcp, No   Brief Narrative/Hospital Course: 88 yof with T2DM, HLD, HTN, CKD stage IV, dementia, DNR presented with altered mental status became bradycardic with heart rate in 30s to 40s hypothermic hypotensive with lactic acidosis admitted for septic shock initially intubated and admitted to ICU subsequently extubated managed IV antibiotics for UTI, sepsis resolved transferred to [facility] 5/29 Antibiotic converted to ceftriaxone, doing well, off pressors.  Has had elevated potassium but complicated course with refusal for Lokelma. 6/3 am further worsening of potassium given temporizing measures-insulin and dextrose plus Lokelma    Subjective: Seen and examined this morning.  Patient is alert awake pleasant reports he feels well and wants to return home.  Nursing reports she got agitated and refused p.o. meds this morning although she took all her meds earlier including lokelma. Refused the laboratory this morning but finally able to draw after Ativan  Assessment and Plan: Principal Problem:   Septic shock ([place]) Active Problems:   Acute metabolic encephalopathy   Hyperlipidemia   Acute lower UTI   Anemia   Dementia with behavioral disturbance   Hyperkalemia   Citrobacter infection   Septic shock due to UTI  UTI due to Citrobacter: Patient was intubated on pressors in ICU, septic shock resolved currently on room air, BP stable urine culture with Citrobacter, completed ceftriaxone 6/2   Acute metabolic encephalopathy Dementia with behavioral disturbance agitation: Patient presented with confusion worse from baseline.  She is alert awake oriented to self, place, day, unable to tell president's name but says she knows who is it. We will continue delirium precaution supportive care with baseline dementia.  Continue Seroquel.  Psych consult to help with behavioral medication  Hyperlipidemia-resume statin  Anemia of  chronic disease-hb stable,monitor. Recent Labs  Lab 04/06/22 0505 04/10/22 0243  HGB 9.9* 10.4*  HCT 31.5* 33.7*     Bradycardia -NSR, echo with EF 50-55% avoid beta-blocker heart rate is stable  Hyperkalemia: S/P Lokelma and insulin dextrose this morning.  Check serial potassium patient previously was refusing Lokelma. Recheck k 5.9-reordered Lokelma - but since she is refusing meds po we will do insulin with dextrose x1 and dextrose infusion with cbg monitoring.  Recent Labs  Lab 04/10/22 0243 04/11/22 0144 04/12/22 0219 04/12/22 0415 04/12/22 1032  K 5.5* 5.5* 6.3* 6.2* 5.9*    Thrombocytopenia in the setting of sepsis.  Improving Recent Labs  Lab 04/06/22 0505 04/10/22 0243  PLT 96* 126*    Hypertension BP stable.  Not on antihypertensive  AKI on CKD stage IV: creat down to baseline 1;8-1.7.  Encourage oral hydration Recent Labs  Lab 04/06/22 1355 04/07/22 0634 04/10/22 0243 04/11/22 0144 04/12/22 0219  BUN 64* 53* 32* 33* 35*  CREATININE 2.04* 1.96* 1.88* 1.87* 1.79*    Generalized weakness/debility-cont PTOT  Type 2 diabetes mellitus blood sugars with hypoglycemia. Recent Labs  Lab 04/11/22 1951 04/12/22 0003 04/12/22 0348 04/12/22 0518 04/12/22 0610  GLUCAP 110* 81 85 90 81    Goals of care/DNR.  Supportive care fall precaution.  DVT prophylaxis: heparin injection 5,000 Units Start: 04/05/22 0600 SCDs Start: 04/05/22 0532 Code Status:   Code Status: DNR Family Communication: plan of care discussed with patient at bedside.  No family in the room today. I called Grandaughter Shamone no answer- vm full, called Daughter [name] in 2 different phone number-no answer/ mail box is full- sent unit no as sms.  Patient status is: inpatient because of ongpoing  management of hyperkalemia aki, deconditioning Level of care: Progressive   Dispo: The patient is [facility]: [facility]            Anticipated disposition: SNF in 24 hrs if potassium and renal function  remains stable  Mobility Assessment (last 72 hours)     Mobility Assessment     Row Name 04/11/22 2000 04/10/22 2200 04/10/22 2000 04/09/22 2000     Does patient have an order for bedrest or is patient medically unstable Yes- Bedfast (Level 1) - Complete Yes- Bedfast (Level 1) - Complete Yes- Bedfast (Level 1) - Complete No - Continue assessment    What is the highest level of mobility based on the progressive mobility assessment? Level 1 (Bedfast) - Unable to balance while sitting on edge of bed Level 1 (Bedfast) - Unable to balance while sitting on edge of bed Level 1 (Bedfast) - Unable to balance while sitting on edge of bed Level 1 (Bedfast) - Unable to balance while sitting on edge of bed    Is the above level different from baseline mobility prior to current illness? Yes - Recommend PT order Yes - Recommend PT order Yes - Recommend PT order No - Consider discontinuing PT/OT            Objective: Vitals last 24 hrs: Vitals:   04/12/22 0000 04/12/22 0354 04/12/22 0358 04/12/22 0732  BP: 133/67 115/70  136/80  Pulse: 63 65  73  Resp: 18 20  18   Temp: 97.6 F (36.4 C) 97.8 F (36.6 C)  97.8 F (36.6 C)  TempSrc: Oral Oral  Axillary  SpO2: 99% 100%  100%  Weight:   91.2 kg   Height:       Weight change:   Physical Examination: General exam: alert awake, oriented x2,88 older than stated age, weak appearing. HEENT:Oral mucosa moist, Ear/Nose WNL grossly, dentition normal. Respiratory system: bilaterally diminished BS, no use of accessory muscle Cardiovascular system: S1 & S2 +, No JVD. Gastrointestinal system: Abdomen soft,NT,ND, BS+ Nervous System:Alert, awake, moving extremities and grossly nonfocal Extremities: LE edema neg,distal peripheral pulses palpable.  Skin: No rashes,no icterus. MSK: Normal muscle bulk,tone, power  Medications reviewed:  Scheduled Meds:  atorvastatin  20 mg Oral Daily   chlorhexidine  15 mL Mouth Rinse BID   divalproex  250 mg Oral TID    heparin  5,000 Units Subcutaneous Q8H   insulin aspart  0-15 Units Subcutaneous Q4H   LORazepam  1 mg Oral BID   Or   LORazepam  1 mg Intravenous BID   mouth rinse  15 mL Mouth Rinse q12n4p   melatonin  3 mg Oral QHS   pantoprazole  40 mg Oral Daily   QUEtiapine  25 mg Oral Daily   Continuous Infusions:  sodium chloride 10 mL/hr at 04/06/22 1000      Diet Order             DIET DYS 3 Room service appropriate? No; Fluid consistency: Thin  Diet effective now                  Intake/Output Summary (Last 24 hours) at 04/12/2022 1116 Last data filed at 04/12/2022 0600 Gross per 24 hour  Intake 1200 ml  Output 1400 ml  Net -200 ml   Net IO Since Admission: 1,946.28 mL [04/12/22 1116]  Wt Readings from Last 3 Encounters:  04/12/22 91.2 kg  10/13/21 89.9 kg  10/01/21 89.9 kg     Unresulted Labs (From admission,  onward)     Start     Ordered   04/12/22 0900  Potassium  STAT Now then every 4 hours ,   R (with STAT occurrences)     Comments: Until normal twice.   Question:  Specimen collection method  Answer:  Lab=Lab collect   04/12/22 0505   04/10/22 7062  Basic metabolic panel  Daily,   R     Question:  Specimen collection method  Answer:  Lab=Lab collect   04/09/22 1455          Data Reviewed: I have personally reviewed following labs and imaging studies CBC: Recent Labs  Lab 04/06/22 0505 04/10/22 0243  WBC 5.6 6.7  HGB 9.9* 10.4*  HCT 31.5* 33.7*  MCV 99.7 100.3*  PLT 96* 376*   Basic Metabolic Panel: Recent Labs  Lab 04/06/22 0505 04/06/22 1355 04/07/22 0634 04/07/22 1402 04/10/22 0243 04/11/22 0144 04/12/22 0219 04/12/22 0415 04/12/22 1032  NA 141 139 139  --  140 138 137  --   --   K 6.7* 6.0* 5.6*   < > 5.5* 5.5* 6.3* 6.2* 5.9*  CL 111 110 110  --  107 107 105  --   --   CO2 23 23 27   --  28 27 26   --   --   GLUCOSE 89 83 78  --  92 207* 88  --   --   BUN 65* 64* 53*  --  32* 33* 35*  --   --   CREATININE 2.18* 2.04* 1.96*  --  1.88*  1.87* 1.79*  --   --   CALCIUM 9.3 9.5 9.6  --  9.6 9.1 9.5  --   --   MG 1.8  --   --   --  1.6* 1.5*  --   --   --   PHOS 4.0  --   --   --  4.0 3.8 4.1  --   --    < > = values in this interval not displayed.   GFR: Estimated Creatinine Clearance: 26.5 mL/min (A) (by C-G formula based on SCr of 1.79 mg/dL (H)). Liver Function Tests: Recent Labs  Lab 04/11/22 0144 04/12/22 0219  ALBUMIN 2.4* 2.9*   No results for input(s): LIPASE, AMYLASE in the last 168 hours. No results for input(s): AMMONIA in the last 168 hours. Coagulation Profile: No results for input(s): INR, PROTIME in the last 168 hours. BNP (last 3 results) No results for input(s): PROBNP in the last 8760 hours. HbA1C: No results for input(s): HGBA1C in the last 72 hours. CBG: Recent Labs  Lab 04/11/22 1951 04/12/22 0003 04/12/22 0348 04/12/22 0518 04/12/22 0610  GLUCAP 110* 81 85 90 81   Lipid Profile: No results for input(s): CHOL, HDL, LDLCALC, TRIG, CHOLHDL, LDLDIRECT in the last 72 hours. Thyroid Function Tests: No results for input(s): TSH, T4TOTAL, FREET4, T3FREE, THYROIDAB in the last 72 hours. Sepsis Labs: No results for input(s): PROCALCITON, LATICACIDVEN in the last 168 hours.  Recent Results (from the past 240 hour(s))  Culture, blood (routine x 2)     Status: None   Collection Time: 04/04/22 12:15 AM   Specimen: BLOOD RIGHT HAND  Result Value Ref Range Status   Specimen Description BLOOD RIGHT HAND  Final   Special Requests   Final    BOTTLES DRAWN AEROBIC AND ANAEROBIC Blood Culture results may not be optimal due to an inadequate volume of blood received in culture bottles  Culture   Final    NO GROWTH 5 DAYS Performed at [facility], [place] [address], [place], [place] [address]    Report Status 04/10/2022 FINAL  Final  Urine Culture     Status: Abnormal   Collection Time: 04/04/22 11:54 PM   Specimen: In/Out Cath Urine  Result Value Ref Range Status   Specimen Description  IN/OUT CATH URINE  Final   Special Requests   Final    NONE Performed at [facility], [place] [address], [place], [place] [address]    Culture >=100,000 COLONIES/mL CITROBACTER FREUNDII (A)  Final   Report Status 04/07/2022 FINAL  Final   Organism ID, Bacteria CITROBACTER FREUNDII (A)  Final      Susceptibility   Citrobacter freundii - MIC*    CEFAZOLIN >=64 RESISTANT Resistant     CEFEPIME <=0.12 SENSITIVE Sensitive     CEFTRIAXONE <=0.25 SENSITIVE Sensitive     CIPROFLOXACIN <=0.25 SENSITIVE Sensitive     GENTAMICIN <=1 SENSITIVE Sensitive     IMIPENEM 0.5 SENSITIVE Sensitive     NITROFURANTOIN <=16 SENSITIVE Sensitive     TRIMETH/SULFA <=20 SENSITIVE Sensitive     PIP/TAZO <=4 SENSITIVE Sensitive     * >=100,000 COLONIES/mL CITROBACTER FREUNDII  Culture, blood (routine x 2)     Status: None   Collection Time: 04/05/22 12:37 AM   Specimen: BLOOD RIGHT ARM  Result Value Ref Range Status   Specimen Description BLOOD RIGHT ARM  Final   Special Requests   Final    BOTTLES DRAWN AEROBIC AND ANAEROBIC Blood Culture adequate volume   Culture   Final    NO GROWTH 5 DAYS Performed at [facility], [address] [place], [place] [address]    Report Status 04/10/2022 FINAL  Final  MRSA Next Gen by PCR, Nasal     Status: None   Collection Time: 04/05/22  9:00 AM   Specimen: Nasal Mucosa; Nasal Swab  Result Value Ref Range Status   MRSA by PCR Next Gen NOT DETECTED NOT DETECTED Final    Comment: (NOTE) The GeneXpert MRSA Assay (FDA approved for NASAL specimens only), is one component of a comprehensive MRSA colonization surveillance program. It is not intended to diagnose MRSA infection nor to guide or monitor treatment for MRSA infections. Test performance is not FDA approved in patients less than 74 years old. Performed at [facility], [place] [address], [place], [place] [address]     Antimicrobials: Anti-infectives (From admission, onward)    Start      Dose/Rate Route Frequency Ordered Stop   04/05/22 0145  cefTRIAXone (ROCEPHIN) 2 g in sodium chloride 0.9 % 100 mL IVPB        2 g 200 mL/hr over 30 Minutes Intravenous Every 24 hours 04/05/22 0144 04/11/22 0100      Culture/Microbiology    Component Value Date/Time   SDES BLOOD RIGHT ARM 04/05/2022 0037   SPECREQUEST  04/05/2022 0037    BOTTLES DRAWN AEROBIC AND ANAEROBIC Blood Culture adequate volume   CULT  04/05/2022 0037    NO GROWTH 5 DAYS Performed at [facility], [place] [address], [place], [place] [address]    REPTSTATUS 04/10/2022 FINAL 04/05/2022 0037   Radiology Studies: No results found.   LOS: 7 days   Antonieta Pert, MD [facility]  04/12/2022, 11:16 AM

## 2022-04-12 NOTE — Progress Notes (Signed)
Hypoglycemic Event  CBG: 52   Treatment: 4 oz juice/soda - soda. No orange juice due to elevated K.  Symptoms: None  Follow-up CBG: Time:2235 CBG Result:62  Possible Reasons for Event: Inadequate meal intake,   Comments/MD notified: T. Opyd, MD     Kaylyn Lim, BSN, PCCN

## 2022-04-12 NOTE — Progress Notes (Signed)
Notified of potassium 6.2. Plan to give Lokelma and insulin with dextrose, check EKG, and give bicarb and calcium if EKG changes.

## 2022-04-12 NOTE — Plan of Care (Signed)
  Problem: Clinical Measurements: Goal: Ability to maintain clinical measurements within normal limits will improve Outcome: Progressing   Problem: Clinical Measurements: Goal: Diagnostic test results will improve Outcome: Progressing   Problem: Clinical Measurements: Goal: Respiratory complications will improve Outcome: Progressing   Problem: Clinical Measurements: Goal: Cardiovascular complication will be avoided Outcome: Progressing   Problem: Nutrition: Goal: Adequate nutrition will be maintained Outcome: Progressing   Problem: Pain Managment: Goal: General experience of comfort will improve Outcome: Progressing   Problem: Safety: Goal: Ability to remain free from injury will improve Outcome: Progressing   Problem: Skin Integrity: Goal: Risk for impaired skin integrity will decrease Outcome: Progressing   

## 2022-04-12 NOTE — Hospital Course (Addendum)
72 yof with T2DM, HLD, HTN, CKD stage IV, dementia, DNR presented with altered mental status became bradycardic with heart rate in 30s to 40s hypothermic hypotensive with lactic acidosis admitted for septic shock initially intubated and admitted to ICU subsequently extubated managed IV antibiotics for UTI, sepsis resolved transferred to [facility] 5/29 Antibiotic converted to ceftriaxone, doing well, off pressors.  Has had elevated potassium but complicated course with refusal for Lokelma. 6/3 am further worsening of potassium given temporizing measures-insulin and dextrose plus Lokelma.she has been agitated psych was consulted.  Patient has been refusing medication care intermittently at times agitated needing sedatives.  Potassium subsequently improving with Lokelma daily.

## 2022-04-13 DIAGNOSIS — A419 Sepsis, unspecified organism: Secondary | ICD-10-CM | POA: Diagnosis not present

## 2022-04-13 DIAGNOSIS — E162 Hypoglycemia, unspecified: Secondary | ICD-10-CM

## 2022-04-13 DIAGNOSIS — R6521 Severe sepsis with septic shock: Secondary | ICD-10-CM | POA: Diagnosis not present

## 2022-04-13 LAB — BASIC METABOLIC PANEL
Anion gap: 6 (ref 5–15)
BUN: 39 mg/dL — ABNORMAL HIGH (ref 8–23)
CO2: 27 mmol/L (ref 22–32)
Calcium: 9.1 mg/dL (ref 8.9–10.3)
Chloride: 102 mmol/L (ref 98–111)
Creatinine, Ser: 2 mg/dL — ABNORMAL HIGH (ref 0.44–1.00)
GFR, Estimated: 23 mL/min — ABNORMAL LOW (ref >60)
Glucose, Bld: 136 mg/dL — ABNORMAL HIGH (ref 70–99)
Potassium: 5.1 mmol/L (ref 3.5–5.1)
Sodium: 135 mmol/L (ref 135–145)

## 2022-04-13 LAB — GLUCOSE, CAPILLARY
Glucose-Capillary: 114 mg/dL — ABNORMAL HIGH (ref 70–99)
Glucose-Capillary: 125 mg/dL — ABNORMAL HIGH (ref 70–99)
Glucose-Capillary: 134 mg/dL — ABNORMAL HIGH (ref 70–99)
Glucose-Capillary: 185 mg/dL — ABNORMAL HIGH (ref 70–99)
Glucose-Capillary: 73 mg/dL (ref 70–99)
Glucose-Capillary: 73 mg/dL (ref 70–99)
Glucose-Capillary: 81 mg/dL (ref 70–99)

## 2022-04-13 LAB — POTASSIUM: Potassium: 5.2 mmol/L — ABNORMAL HIGH (ref 3.5–5.1)

## 2022-04-13 MED ORDER — DEXTROSE 50 % IV SOLN
INTRAVENOUS | Status: AC
  Filled 2022-04-13: qty 50

## 2022-04-13 MED ORDER — OLANZAPINE 10 MG IM SOLR
5.0000 mg | INTRAMUSCULAR | Status: DC
  Filled 2022-04-13: qty 10

## 2022-04-13 MED ORDER — LORAZEPAM 2 MG/ML IJ SOLN
1.0000 mg | Freq: Once | INTRAMUSCULAR | Status: AC
  Administered 2022-04-13: 1 mg via INTRAMUSCULAR
  Filled 2022-04-13: qty 1

## 2022-04-13 MED ORDER — OLANZAPINE 10 MG IM SOLR
5.0000 mg | INTRAMUSCULAR | Status: AC
  Administered 2022-04-13: 5 mg via INTRAMUSCULAR
  Filled 2022-04-13: qty 10

## 2022-04-13 MED ORDER — DEXTROSE-NACL 5-0.9 % IV SOLN: INTRAVENOUS | Status: DC

## 2022-04-13 MED ORDER — OLANZAPINE 10 MG IM SOLR
5.0000 mg | Freq: Once | INTRAMUSCULAR | Status: DC
  Filled 2022-04-13: qty 10

## 2022-04-13 NOTE — Progress Notes (Signed)
PROGRESS NOTE [name] [name]  IDP:[id number] DOB: 1930-10-11 DOA: 04/04/2022 PCP: Pcp, No   Brief Narrative/Hospital Course: 9 yof with T2DM, HLD, HTN, CKD stage IV, dementia, DNR presented with altered mental status became bradycardic with heart rate in 30s to 40s hypothermic hypotensive with lactic acidosis admitted for septic shock initially intubated and admitted to ICU subsequently extubated managed IV antibiotics for UTI, sepsis resolved transferred to [facility] 5/29 Antibiotic converted to ceftriaxone, doing well, off pressors.  Has had elevated potassium but complicated course with refusal for Lokelma. 6/3 am further worsening of potassium given temporizing measures-insulin and dextrose plus Lokelma  Subjective: Seen and examined this morning was alert awake, with baseline memory issues Overnight afebrile, potassium has been fluctuating subsequently coming down after temporizing measures, also had hypoglycemia  and improved. Has been agitated refusing blood draws at times. Nursing reports later in the morning patient was agitated refusing IV placement Assessment and Plan: Principal Problem:   Septic shock ([place]) Active Problems:   Acute metabolic encephalopathy   Hyperlipidemia   Acute lower UTI   Anemia   Dementia with behavioral disturbance   Hyperkalemia   Citrobacter infection   Hypoglycemia   Septic shock due to UTI  UTI due to Citrobacter: Patient was intubated, on pressors in ICU>septic shock resolved.  Doing well on room air.Urine culture with Citrobacter, completed ceftriaxone 02/11/30   Acute metabolic encephalopathy Dementia with behavioral disturbance agitation: Patient presented with confusion worse from baseline.  She is alert awake oriented to self, place, day, unable to tell president's name but says she knows who is it.  At times agitated refusing medication and blood draws.  Appreciate psychiatry input increase Seroquel from 25 to 50 mg, Depakote 250 3 times daily for  mood/agitation, melatonin 3 mg bedtime, added Zyprexa 5 mg bid prn-not taking p.o., will order Zyprexa 5 mg x 1 for her agitation for IV access.Continue delirium precaution, fall precaution supportive care, re-orientation.also on ativan prn.  Hyperlipidemia-cont statin  Anemia of chronic disease-hb stable,monitor. Recent Labs  Lab 04/10/22 0243  HGB 10.4*  HCT 33.7*  Bradycardia -NSR, echo with EF 50-55% avoid beta-blocker,  Hyperkalemia: S/P Lokelma and insulin/dextrose-subsequently potassium improved to 5.2.  Continue daily Lokelma, low potassium diet, hydration for AKI.  Recent Labs  Lab 04/12/22 0415 04/12/22 1032 04/12/22 1432 04/12/22 1920 04/13/22 0308  K 6.2* 5.9* 5.7* 6.0* 5.1  5.2*    Thrombocytopenia in the setting of sepsis.  Improving Recent Labs  Lab 04/10/22 0243  PLT 126*    Hypertension BP stable.  Not on antihypertensive  AKI on CKD stage IV: creat again further uptrending at 2.2 could be baseline but will treat with IV hydration overnight and repeat BMP in the morning  Recent Labs  Lab 04/07/22 0634 04/10/22 0243 04/11/22 0144 04/12/22 0219 04/13/22 0308  BUN 53* 32* 33* 35* 39*  CREATININE 1.96* 1.88* 1.87* 1.79* 2.00*    Generalized weakness/debility-cont PTOT  Type 2 diabetes mellitus blood sugars controlled, had hypoglycemia after insulin for hyperkalemia.  Added D5 NS for now for AKI as well.Encourage p.o.   Recent Labs  Lab 04/12/22 2236 04/12/22 2358 04/13/22 0352 04/13/22 0750 04/13/22 1013  GLUCAP 62* 113* 125* 73 73    Goals of care/DNR.  Cont supportive care fall precaution.  DVT prophylaxis: heparin injection 5,000 Units Start: 04/05/22 0600 SCDs Start: 04/05/22 0532 Code Status:   Code Status: DNR Family Communication: plan of care discussed with patient at bedside.  No family in the room-I had  called Grandaughter Shamone no answer- vm full, called Daughter [name] in 2 different phone numbers-no answer/ mail box is full- sent  unit no as sms.  Patient status is: inpatient because of ongpoing management of hyperkalemia aki, deconditioning Level of care: Progressive   Dispo: The patient is from: [facility]            Anticipated disposition: SNF 1-2 days if potassium and renal function remains stable  Mobility Assessment (last 72 hours)     Mobility Assessment     Row Name 04/12/22 2000 04/12/22 1400 04/12/22 1248 04/11/22 2000 04/10/22 2200   Does patient have an order for bedrest or is patient medically unstable Yes- Bedfast (Level 1) - Complete -- -- Yes- Bedfast (Level 1) - Complete Yes- Bedfast (Level 1) - Complete   What is the highest level of mobility based on the progressive mobility assessment? Level 1 (Bedfast) - Unable to balance while sitting on edge of bed Level 4 (Walks with assist in room) - Balance while marching in place and cannot step forward and back - Complete Level 3 (Stands with assist) - Balance while standing  and cannot march in place Level 1 (Bedfast) - Unable to balance while sitting on edge of bed Level 1 (Bedfast) - Unable to balance while sitting on edge of bed   Is the above level different from baseline mobility prior to current illness? Yes - Recommend PT order -- -- Yes - Recommend PT order Yes - Recommend PT order    [place] Name 04/10/22 2000           Does patient have an order for bedrest or is patient medically unstable Yes- Bedfast (Level 1) - Complete       What is the highest level of mobility based on the progressive mobility assessment? Level 1 (Bedfast) - Unable to balance while sitting on edge of bed       Is the above level different from baseline mobility prior to current illness? Yes - Recommend PT order               Objective: Vitals last 24 hrs: Vitals:   04/12/22 0732 04/12/22 1948 04/13/22 0357 04/13/22 0403  BP: 136/80 119/64 (!) 122/58   Pulse: 73 (!) 52 (!) 49   Resp: 18 17 (!) 21   Temp: 97.8 F (36.6 C) 97.7 F (36.5 C) 97.8 F (36.6 C)    TempSrc: Axillary Axillary Oral   SpO2: 100% 96% 95%   Weight:    92 kg  Height:       Weight change: 0.8 kg  Physical Examination: General exam: Aax1-2, older than stated age, weak appearing. HEENT:Oral mucosa moist, Ear/Nose WNL grossly, dentition normal. Respiratory system: bilaterally clear, no use of accessory muscle Cardiovascular system: S1 & S2 +, No JVD,. Gastrointestinal system: Abdomen soft,NT,ND,BS+ Nervous System:Alert, awake, moving extremities and grossly nonfocal Extremities: LE ankle edema none, distal peripheral pulses palpable.  Skin: No rashes,no icterus. MSK: Normal muscle bulk,tone, power   Medications reviewed:  Scheduled Meds:  atorvastatin  20 mg Oral Daily   chlorhexidine  15 mL Mouth Rinse BID   insulin aspart  5 Units Intravenous Once   And   dextrose  1 ampule Intravenous Once   dextrose       divalproex  250 mg Oral TID   heparin  5,000 Units Subcutaneous Q8H   insulin aspart  0-15 Units Subcutaneous Q4H   LORazepam  1 mg Oral BID  Or   LORazepam  1 mg Intravenous BID   mouth rinse  15 mL Mouth Rinse q12n4p   melatonin  3 mg Oral QHS   pantoprazole  40 mg Oral Daily   QUEtiapine  50 mg Oral Daily   Continuous Infusions:  sodium chloride 10 mL/hr at 04/06/22 1000   dextrose 5 % and 0.9% NaCl        Diet Order             DIET DYS 3 Room service appropriate? No; Fluid consistency: Thin  Diet effective now                  Intake/Output Summary (Last 24 hours) at 04/13/2022 1101 Last data filed at 04/13/2022 1000 Gross per 24 hour  Intake 118 ml  Output 900 ml  Net -782 ml   Net IO Since Admission: 1,164.28 mL [04/13/22 1101]  Wt Readings from Last 3 Encounters:  04/13/22 92 kg  10/13/21 89.9 kg  10/01/21 89.9 kg     Unresulted Labs (From admission, onward)     Start     Ordered   04/10/22 2637  Basic metabolic panel  Daily,   R     Question:  Specimen collection method  Answer:  Lab=Lab collect   04/09/22 1455           Data Reviewed: I have personally reviewed following labs and imaging studies CBC: Recent Labs  Lab 04/10/22 0243  WBC 6.7  HGB 10.4*  HCT 33.7*  MCV 100.3*  PLT 858*   Basic Metabolic Panel: Recent Labs  Lab 04/07/22 0634 04/07/22 1402 04/10/22 0243 04/11/22 0144 04/12/22 0219 04/12/22 0415 04/12/22 1032 04/12/22 1432 04/12/22 1920 04/13/22 0308  NA 139  --  140 138 137  --   --   --   --  135  K 5.6*   < > 5.5* 5.5* 6.3* 6.2* 5.9* 5.7* 6.0* 5.1  5.2*  CL 110  --  107 107 105  --   --   --   --  102  CO2 27  --  28 27 26   --   --   --   --  27  GLUCOSE 78  --  92 207* 88  --   --  71  --  136*  BUN 53*  --  32* 33* 35*  --   --   --   --  39*  CREATININE 1.96*  --  1.88* 1.87* 1.79*  --   --   --   --  2.00*  CALCIUM 9.6  --  9.6 9.1 9.5  --   --   --   --  9.1  MG  --   --  1.6* 1.5*  --   --   --   --   --   --   PHOS  --   --  4.0 3.8 4.1  --   --   --   --   --    < > = values in this interval not displayed.   GFR: Estimated Creatinine Clearance: 23.8 mL/min (A) (by C-G formula based on SCr of 2 mg/dL (H)). Liver Function Tests: Recent Labs  Lab 04/11/22 0144 04/12/22 0219  ALBUMIN 2.4* 2.9*   No results for input(s): LIPASE, AMYLASE in the last 168 hours. No results for input(s): AMMONIA in the last 168 hours. Coagulation Profile: No results for input(s): INR, PROTIME in the last 168 hours.  BNP (last 3 results) No results for input(s): PROBNP in the last 8760 hours. HbA1C: No results for input(s): HGBA1C in the last 72 hours. CBG: Recent Labs  Lab 04/12/22 2236 04/12/22 2358 04/13/22 0352 04/13/22 0750 04/13/22 1013  GLUCAP 62* 113* 125* 73 73   Lipid Profile: No results for input(s): CHOL, HDL, LDLCALC, TRIG, CHOLHDL, LDLDIRECT in the last 72 hours. Thyroid Function Tests: No results for input(s): TSH, T4TOTAL, FREET4, T3FREE, THYROIDAB in the last 72 hours. Sepsis Labs: No results for input(s): PROCALCITON, LATICACIDVEN in the last  168 hours.  Recent Results (from the past 240 hour(s))  Culture, blood (routine x 2)     Status: None   Collection Time: 04/04/22 12:15 AM   Specimen: BLOOD RIGHT HAND  Result Value Ref Range Status   Specimen Description BLOOD RIGHT HAND  Final   Special Requests   Final    BOTTLES DRAWN AEROBIC AND ANAEROBIC Blood Culture results may not be optimal due to an inadequate volume of blood received in culture bottles   Culture   Final    NO GROWTH 5 DAYS Performed at [facility], [place] [address], [place], [place] [address]    Report Status 04/10/2022 FINAL  Final  Urine Culture     Status: Abnormal   Collection Time: 04/04/22 11:54 PM   Specimen: In/Out Cath Urine  Result Value Ref Range Status   Specimen Description IN/OUT CATH URINE  Final   Special Requests   Final    NONE Performed at [facility], [place] [address] [place], [place] [address]    Culture >=100,000 COLONIES/mL CITROBACTER FREUNDII (A)  Final   Report Status 04/07/2022 FINAL  Final   Organism ID, Bacteria CITROBACTER FREUNDII (A)  Final      Susceptibility   Citrobacter freundii - MIC*    CEFAZOLIN >=64 RESISTANT Resistant     CEFEPIME <=0.12 SENSITIVE Sensitive     CEFTRIAXONE <=0.25 SENSITIVE Sensitive     CIPROFLOXACIN <=0.25 SENSITIVE Sensitive     GENTAMICIN <=1 SENSITIVE Sensitive     IMIPENEM 0.5 SENSITIVE Sensitive     NITROFURANTOIN <=16 SENSITIVE Sensitive     TRIMETH/SULFA <=20 SENSITIVE Sensitive     PIP/TAZO <=4 SENSITIVE Sensitive     * >=100,000 COLONIES/mL CITROBACTER FREUNDII  Culture, blood (routine x 2)     Status: None   Collection Time: 04/05/22 12:37 AM   Specimen: BLOOD RIGHT ARM  Result Value Ref Range Status   Specimen Description BLOOD RIGHT ARM  Final   Special Requests   Final    BOTTLES DRAWN AEROBIC AND ANAEROBIC Blood Culture adequate volume   Culture   Final    NO GROWTH 5 DAYS Performed at [facility], [address] [place], [place] [address]     Report Status 04/10/2022 FINAL  Final  MRSA Next Gen by PCR, Nasal     Status: None   Collection Time: 04/05/22  9:00 AM   Specimen: Nasal Mucosa; Nasal Swab  Result Value Ref Range Status   MRSA by PCR Next Gen NOT DETECTED NOT DETECTED Final    Comment: (NOTE) The GeneXpert MRSA Assay (FDA approved for NASAL specimens only), is one component of a comprehensive MRSA colonization surveillance program. It is not intended to diagnose MRSA infection nor to guide or monitor treatment for MRSA infections. Test performance is not FDA approved in patients less than 85 years old. Performed at [facility], [place]  [address], [place], [place] [address]     Antimicrobials: Anti-infectives (From admission, onward)    Start     Dose/Rate Route Frequency Ordered Stop   04/05/22 0145  cefTRIAXone (ROCEPHIN) 2 g in sodium chloride 0.9 % 100 mL IVPB        2 g 200 mL/hr over 30 Minutes Intravenous Every 24 hours 04/05/22 0144 04/11/22 0100      Culture/Microbiology    Component Value Date/Time   SDES BLOOD RIGHT ARM 04/05/2022 0037   SPECREQUEST  04/05/2022 0037    BOTTLES DRAWN AEROBIC AND ANAEROBIC Blood Culture adequate volume   CULT  04/05/2022 0037    NO GROWTH 5 DAYS Performed at [facility], [place] [address], [place], [place] [address]    REPTSTATUS 04/10/2022 FINAL 04/05/2022 0037   Radiology Studies: No results found.   LOS: 8 days   Antonieta Pert, MD [facility]  04/13/2022, 11:01 AM

## 2022-04-13 NOTE — Plan of Care (Signed)
  Problem: Clinical Measurements: Goal: Ability to maintain clinical measurements within normal limits will improve Outcome: Progressing   Problem: Clinical Measurements: Goal: Diagnostic test results will improve Outcome: Progressing   Problem: Clinical Measurements: Goal: Respiratory complications will improve Outcome: Progressing   Problem: Clinical Measurements: Goal: Cardiovascular complication will be avoided Outcome: Progressing   Problem: Activity: Goal: Risk for activity intolerance will decrease Outcome: Progressing   Problem: Coping: Goal: Level of anxiety will decrease Outcome: Progressing   Problem: Safety: Goal: Ability to remain free from injury will improve Outcome: Progressing   Problem: Skin Integrity: Goal: Risk for impaired skin integrity will decrease Outcome: Progressing

## 2022-04-13 NOTE — Progress Notes (Signed)
PIV consult: pt agitated, no agreeable to IV placement at this time. IM medication given, RN will re-enter consult when pt calms down.

## 2022-04-13 NOTE — Progress Notes (Signed)
PIV consult: Second visit by this RN for IV start. Pt became agitated, pulling away when approached with needle. Will have a different VAST RN attempt.

## 2022-04-13 NOTE — Progress Notes (Signed)
Agitated. Blood extraction not possible at this time. Will follow up.

## 2022-04-13 NOTE — Progress Notes (Signed)
Another consult was placed to IV Therapy to restart an iv;  Pt seen by 2 more IV Nurses, but she is refusing to even allow a tourniquet to be put on;  have spoken with an RN ;  RN to contact MD;  pt remains without access at this time.

## 2022-04-14 DIAGNOSIS — A419 Sepsis, unspecified organism: Secondary | ICD-10-CM | POA: Diagnosis not present

## 2022-04-14 DIAGNOSIS — R6521 Severe sepsis with septic shock: Secondary | ICD-10-CM | POA: Diagnosis not present

## 2022-04-14 LAB — BASIC METABOLIC PANEL
Anion gap: 5 (ref 5–15)
BUN: 38 mg/dL — ABNORMAL HIGH (ref 8–23)
CO2: 25 mmol/L (ref 22–32)
Calcium: 9 mg/dL (ref 8.9–10.3)
Chloride: 109 mmol/L (ref 98–111)
Creatinine, Ser: 2.12 mg/dL — ABNORMAL HIGH (ref 0.44–1.00)
GFR, Estimated: 22 mL/min — ABNORMAL LOW (ref >60)
Glucose, Bld: 98 mg/dL (ref 70–99)
Potassium: 5.5 mmol/L — ABNORMAL HIGH (ref 3.5–5.1)
Sodium: 139 mmol/L (ref 135–145)

## 2022-04-14 LAB — GLUCOSE, CAPILLARY
Glucose-Capillary: 112 mg/dL — ABNORMAL HIGH (ref 70–99)
Glucose-Capillary: 118 mg/dL — ABNORMAL HIGH (ref 70–99)
Glucose-Capillary: 88 mg/dL (ref 70–99)
Glucose-Capillary: 94 mg/dL (ref 70–99)
Glucose-Capillary: 97 mg/dL (ref 70–99)
Glucose-Capillary: 99 mg/dL (ref 70–99)

## 2022-04-14 MED ORDER — SODIUM ZIRCONIUM CYCLOSILICATE 10 G PO PACK
10.0000 g | PACK | Freq: Every day | ORAL | Status: DC
  Administered 2022-04-14 – 2022-04-15 (×2): 10 g via ORAL
  Filled 2022-04-14 (×2): qty 1

## 2022-04-14 NOTE — TOC Progression Note (Addendum)
Transition of Care [facility]) - Progression Note    Patient Details  Name: [name] [name] MRN: [id number] Date of Birth: 08/15/1930  Transition [facility] CM/SW [place], [place] Phone Number: 04/14/2022, 11:19 AM  Clinical Narrative:     Update- CSW started insurance authorization for patient reference # [id number].  Plan is for patient to return to [facility] with palliative services to follow when medically ready for dc. CSW will continue to follow and assist with patients dc planning needs.  Expected Discharge Plan: [place]    Expected Discharge Plan and Services Expected Discharge Plan: [place] arrangements for the past 2 months: [place]                                       Social Determinants of Health (SDOH) Interventions    Readmission Risk Interventions    09/12/2021    2:32 PM  Readmission Risk Prevention Plan  Transportation Screening Complete  Medication Review [employer]) Complete  SW Recovery Care/Counseling Consult Complete  Palliative Care Screening Complete  Skilled Nursing Facility Complete

## 2022-04-14 NOTE — Progress Notes (Signed)
Dr. Maren Beach updated via secure chat about pt being so sleepy. Had prior lorazepam 1 mg tablet given to pt. It is scheduled BID. Had been attewmptiong to wake up pt so she can eat  but falls back to sleep. Asked MD to reevaluate need for lorazepam. Current BP 158/53 with a CBg = 94. O2 saturation at 95 % R A .

## 2022-04-14 NOTE — Progress Notes (Signed)
PROGRESS NOTE [name] [name]  TDD:[id number] DOB: [birth year] DOA: 04/04/2022 PCP: Pcp, No   Brief Narrative/Hospital Course: 88 yof with T2DM, HLD, HTN, CKD stage IV, dementia, DNR presented with altered mental status became bradycardic with heart rate in 30s to 40s hypothermic hypotensive with lactic acidosis admitted for septic shock initially intubated and admitted to ICU subsequently extubated managed IV antibiotics for UTI, sepsis resolved transferred to [facility] 5/29 Antibiotic converted to ceftriaxone, doing well, off pressors.  Has had elevated potassium but complicated course with refusal for Lokelma. 6/3 am further worsening of potassium given temporizing measures-insulin and dextrose plus Lokelma.she has been agitated psych was consulted  Subjective: Seen thsi am  Alert awake, oriented to hospital not to date or evetns- wants to go home- started praying during my exam. Is calm this am Patient refused IV access yesterday and have one today- getting ivf Potassium slightly up 5.5 creatinine at 2.1 stable  Assessment and Plan: Principal Problem:   Septic shock ([place]) Active Problems:   Acute metabolic encephalopathy   Hyperlipidemia   Acute lower UTI   Anemia   Dementia with behavioral disturbance   Hyperkalemia   Citrobacter infection   Hypoglycemia   Septic shock due to UTI  UTI due to Citrobacter: Patient was intubated, on pressors in ICU>septic shock resolved.  Doing well on room air.Urine culture with Citrobacter, completed ceftriaxone 04/11/36   Acute metabolic encephalopathy Dementia with behavioral disturbance agitation: Patient presented with confusion worse from baseline.  Patient has been having intermittent agitation refusing IV access on the right alert awake, continue with melatonin at bedtime, Ativan 1 mg twice daily, Depakote 250 3 times daily, Zyprexa twice daily as needed.  Needed IM Ativan and Zyprexa 6/4.  On seroquel increased from 25 to 50 mg. Cont delirium  precaution, fall precaution supportive care, re-orientation.  Hyperlipidemia-cont statin  Anemia of chronic disease-hb stable,monitor. Recent Labs  Lab 04/10/22 0243  HGB 10.4*  HCT 33.7*   Bradycardia -NSR, echo with EF 50-55% avoid beta-blocker,  Hyperkalemia: S/P Lokelma and insulin/dextrose-subsequently potassium improved to 5.2> slightly uptrending 5.5 continue Lokelma 10 mg daily hydration po, Recent Labs  Lab 04/12/22 1032 04/12/22 1432 04/12/22 1920 04/13/22 0308 04/14/22 0533  K 5.9* 5.7* 6.0* 5.1  5.2* 5.5*    Thrombocytopenia in the setting of sepsis.  Improving Recent Labs  Lab 04/10/22 0243  PLT 126*    Hypertension BP stable.  Not on antihypertensive  AKI on CKD stage IV: creat further up but stable in 2.1-?could be baseline-ordered IV fluid hydration but patient has been refusing IV access, has ivaccess this am- cont IVF- monitor BMP   Generalized weakness/debility-cont PTOT  Type 2 diabetes mellitus blood sugars controlled, had hypoglycemia after insulin for hyperkalemia patient sugar is well maintained . Encourage po Recent Labs  Lab 04/13/22 2013 04/13/22 2348 04/14/22 0002 04/14/22 0337 04/14/22 0744  GLUCAP 185* 134* 97 112* 88    Goals of care/DNR.  Cont supportive care fall precaution.  DVT prophylaxis: heparin injection 5,000 Units Start: 04/05/22 0600 SCDs Start: 04/05/22 0532 Code Status:   Code Status: DNR Family Communication: plan of care discussed with patient at bedside.  No family in the room-I had  called Grandaughter Shamone no answer- vm full, called Daughter [name] in 2 different phone numbers-no answer/ mail box is full- sent unit no as sms.  Patient status is: inpatient because of ongpoing management of hyperkalemia aki, deconditioning Level of care: Progressive   Dispo: The patient is from: [facility]  Anticipated disposition: SNF 1-2 days if potassium and renal function remains stable  Mobility Assessment (last  72 hours)     Mobility Assessment     Row Name 04/14/22 0802 04/13/22 2000 04/13/22 1930 04/13/22 1100 04/12/22 2000   Does patient have an order for bedrest or is patient medically unstable Yes- Bedfast (Level 1) - Complete Yes- Bedfast (Level 1) - Complete Yes- Bedfast (Level 1) - Complete No - Continue assessment Yes- Bedfast (Level 1) - Complete   What is the highest level of mobility based on the progressive mobility assessment? Level 1 (Bedfast) - Unable to balance while sitting on edge of bed Level 1 (Bedfast) - Unable to balance while sitting on edge of bed Level 1 (Bedfast) - Unable to balance while sitting on edge of bed Level 3 (Stands with assist) - Balance while standing  and cannot march in place Level 1 (Bedfast) - Unable to balance while sitting on edge of bed   Is the above level different from baseline mobility prior to current illness? Yes - Recommend PT order Yes - Recommend PT order Yes - Recommend PT order Yes - Recommend PT order Yes - Recommend PT order    [facility] Name 04/12/22 1400 04/12/22 1248 04/11/22 2000       Does patient have an order for bedrest or is patient medically unstable -- -- Yes- Bedfast (Level 1) - Complete     What is the highest level of mobility based on the progressive mobility assessment? Level 4 (Walks with assist in room) - Balance while marching in place and cannot step forward and back - Complete Level 3 (Stands with assist) - Balance while standing  and cannot march in place Level 1 (Bedfast) - Unable to balance while sitting on edge of bed     Is the above level different from baseline mobility prior to current illness? -- -- Yes - Recommend PT order             Objective: Vitals last 24 hrs: Vitals:   04/13/22 2001 04/13/22 2351 04/14/22 0343 04/14/22 0412  BP: 107/71 120/78 (!) 112/57   Pulse: (!) 102 81 67   Resp: 20 19 (!) 22   Temp:  98.1 F (36.7 C) 97.8 F (36.6 C)   TempSrc:  Axillary Oral   SpO2: 96% 96% 96%   Weight:    91.5  kg  Height:       Weight change: -0.5 kg  Physical Examination: General exam: Aaox1-2, older than stated age, weak appearing. HEENT:Oral mucosa moist, Ear/Nose WNL grossly, dentition normal. Respiratory system: bilaterally diminished, no use of accessory muscle Cardiovascular system: S1 & S2 +, No JVD,. Gastrointestinal system: Abdomen soft,NT,ND,BS+ Nervous System:Alert, awake, moving extremities and grossly nonfocal Extremities: LE ankle edema none, distal peripheral pulses palpable.  Skin: No rashes,no icterus. MSK: Normal muscle bulk,tone, power   Medications reviewed:  Scheduled Meds:  atorvastatin  20 mg Oral Daily   chlorhexidine  15 mL Mouth Rinse BID   insulin aspart  5 Units Intravenous Once   And   dextrose  1 ampule Intravenous Once   divalproex  250 mg Oral TID   heparin  5,000 Units Subcutaneous Q8H   insulin aspart  0-15 Units Subcutaneous Q4H   LORazepam  1 mg Oral BID   Or   LORazepam  1 mg Intravenous BID   mouth rinse  15 mL Mouth Rinse q12n4p   melatonin  3 mg Oral QHS   pantoprazole  40 mg Oral Daily   QUEtiapine  50 mg Oral Daily   sodium zirconium cyclosilicate  10 g Oral Daily   Continuous Infusions:  sodium chloride 10 mL/hr at 04/06/22 1000   dextrose 5 % and 0.9% NaCl 75 mL/hr at 04/14/22 0254      Diet Order             DIET DYS 3 Room service appropriate? No; Fluid consistency: Thin; Fluid restriction: Other (see comments)  Diet effective now                  Intake/Output Summary (Last 24 hours) at 04/14/2022 0934 Last data filed at 04/14/2022 0600 Gross per 24 hour  Intake 1247.29 ml  Output 860 ml  Net 387.29 ml   Net IO Since Admission: 1,433.57 mL [04/14/22 0934]  Wt Readings from Last 3 Encounters:  04/14/22 91.5 kg  10/13/21 89.9 kg  10/01/21 89.9 kg     Unresulted Labs (From admission, onward)     Start     Ordered   04/10/22 2706  Basic metabolic panel  Daily,   R     Question:  Specimen collection method  Answer:   Lab=Lab collect   04/09/22 1455          Data Reviewed: I have personally reviewed following labs and imaging studies CBC: Recent Labs  Lab 04/10/22 0243  WBC 6.7  HGB 10.4*  HCT 33.7*  MCV 100.3*  PLT 237*   Basic Metabolic Panel: Recent Labs  Lab 04/10/22 0243 04/11/22 0144 04/12/22 0219 04/12/22 0415 04/12/22 1032 04/12/22 1432 04/12/22 1920 04/13/22 0308 04/14/22 0533  NA 140 138 137  --   --   --   --  135 139  K 5.5* 5.5* 6.3*   < > 5.9* 5.7* 6.0* 5.1  5.2* 5.5*  CL 107 107 105  --   --   --   --  102 109  CO2 28 27 26   --   --   --   --  27 25  GLUCOSE 92 207* 88  --   --  71  --  136* 98  BUN 32* 33* 35*  --   --   --   --  39* 38*  CREATININE 1.88* 1.87* 1.79*  --   --   --   --  2.00* 2.12*  CALCIUM 9.6 9.1 9.5  --   --   --   --  9.1 9.0  MG 1.6* 1.5*  --   --   --   --   --   --   --   PHOS 4.0 3.8 4.1  --   --   --   --   --   --    < > = values in this interval not displayed.   GFR: Estimated Creatinine Clearance: 22.4 mL/min (A) (by C-G formula based on SCr of 2.12 mg/dL (H)). Liver Function Tests: Recent Labs  Lab 04/11/22 0144 04/12/22 0219  ALBUMIN 2.4* 2.9*   No results for input(s): LIPASE, AMYLASE in the last 168 hours. No results for input(s): AMMONIA in the last 168 hours. Coagulation Profile: No results for input(s): INR, PROTIME in the last 168 hours. BNP (last 3 results) No results for input(s): PROBNP in the last 8760 hours. HbA1C: No results for input(s): HGBA1C in the last 72 hours. CBG: Recent Labs  Lab 04/13/22 2013 04/13/22 2348 04/14/22 0002 04/14/22 0337 04/14/22 0744  GLUCAP 185*  134* 97 112* 88   Lipid Profile: No results for input(s): CHOL, HDL, LDLCALC, TRIG, CHOLHDL, LDLDIRECT in the last 72 hours. Thyroid Function Tests: No results for input(s): TSH, T4TOTAL, FREET4, T3FREE, THYROIDAB in the last 72 hours. Sepsis Labs: No results for input(s): PROCALCITON, LATICACIDVEN in the last 168 hours.  Recent  Results (from the past 240 hour(s))  Urine Culture     Status: Abnormal   Collection Time: 04/04/22 11:54 PM   Specimen: In/Out Cath Urine  Result Value Ref Range Status   Specimen Description IN/OUT CATH URINE  Final   Special Requests   Final    NONE Performed at [facility], [address] [place], [place] [address]    Culture >=100,000 COLONIES/mL CITROBACTER FREUNDII (A)  Final   Report Status 04/07/2022 FINAL  Final   Organism ID, Bacteria CITROBACTER FREUNDII (A)  Final      Susceptibility   Citrobacter freundii - MIC*    CEFAZOLIN >=64 RESISTANT Resistant     CEFEPIME <=0.12 SENSITIVE Sensitive     CEFTRIAXONE <=0.25 SENSITIVE Sensitive     CIPROFLOXACIN <=0.25 SENSITIVE Sensitive     GENTAMICIN <=1 SENSITIVE Sensitive     IMIPENEM 0.5 SENSITIVE Sensitive     NITROFURANTOIN <=16 SENSITIVE Sensitive     TRIMETH/SULFA <=20 SENSITIVE Sensitive     PIP/TAZO <=4 SENSITIVE Sensitive     * >=100,000 COLONIES/mL CITROBACTER FREUNDII  Culture, blood (routine x 2)     Status: None   Collection Time: 04/05/22 12:37 AM   Specimen: BLOOD RIGHT ARM  Result Value Ref Range Status   Specimen Description BLOOD RIGHT ARM  Final   Special Requests   Final    BOTTLES DRAWN AEROBIC AND ANAEROBIC Blood Culture adequate volume   Culture   Final    NO GROWTH 5 DAYS Performed at [facility], [address] [place], [place] [address]    Report Status 04/10/2022 FINAL  Final  MRSA Next Gen by PCR, Nasal     Status: None   Collection Time: 04/05/22  9:00 AM   Specimen: Nasal Mucosa; Nasal Swab  Result Value Ref Range Status   MRSA by PCR Next Gen NOT DETECTED NOT DETECTED Final    Comment: (NOTE) The GeneXpert MRSA Assay (FDA approved for NASAL specimens only), is one component of a comprehensive MRSA colonization surveillance program. It is not intended to diagnose MRSA infection nor to guide or monitor treatment for MRSA infections. Test performance is not FDA approved in  patients less than 41 years old. Performed at [facility], [place] [address] [place], [place] [address]     Antimicrobials: Anti-infectives (From admission, onward)    Start     Dose/Rate Route Frequency Ordered Stop   04/05/22 0145  cefTRIAXone (ROCEPHIN) 2 g in sodium chloride 0.9 % 100 mL IVPB        2 g 200 mL/hr over 30 Minutes Intravenous Every 24 hours 04/05/22 0144 04/11/22 0100      Culture/Microbiology    Component Value Date/Time   SDES BLOOD RIGHT ARM 04/05/2022 0037   SPECREQUEST  04/05/2022 0037    BOTTLES DRAWN AEROBIC AND ANAEROBIC Blood Culture adequate volume   CULT  04/05/2022 0037    NO GROWTH 5 DAYS Performed at [facility], [place] [address] [place], [place] [address]    REPTSTATUS 04/10/2022 FINAL 04/05/2022 0037   Radiology Studies: No results found.   LOS: 9 days  Antonieta Pert, MD [facility]  04/14/2022, 9:34 AM

## 2022-04-14 NOTE — Care Management Important Message (Signed)
Important Message  Patient Details  Name: Charlette Hennings MRN: [id number] Date of Birth: 05-13-1930   Medicare Important Message Given:  Yes     [name] 04/14/2022, 11:52 AM

## 2022-04-15 DIAGNOSIS — G9341 Metabolic encephalopathy: Secondary | ICD-10-CM

## 2022-04-15 DIAGNOSIS — A419 Sepsis, unspecified organism: Secondary | ICD-10-CM | POA: Diagnosis not present

## 2022-04-15 DIAGNOSIS — R001 Bradycardia, unspecified: Secondary | ICD-10-CM

## 2022-04-15 DIAGNOSIS — R6521 Severe sepsis with septic shock: Secondary | ICD-10-CM | POA: Diagnosis not present

## 2022-04-15 LAB — GLUCOSE, CAPILLARY
Glucose-Capillary: 100 mg/dL — ABNORMAL HIGH (ref 70–99)
Glucose-Capillary: 102 mg/dL — ABNORMAL HIGH (ref 70–99)
Glucose-Capillary: 117 mg/dL — ABNORMAL HIGH (ref 70–99)
Glucose-Capillary: 121 mg/dL — ABNORMAL HIGH (ref 70–99)
Glucose-Capillary: 76 mg/dL (ref 70–99)
Glucose-Capillary: 84 mg/dL (ref 70–99)
Glucose-Capillary: 93 mg/dL (ref 70–99)

## 2022-04-15 LAB — BASIC METABOLIC PANEL
Anion gap: 5 (ref 5–15)
BUN: 38 mg/dL — ABNORMAL HIGH (ref 8–23)
CO2: 26 mmol/L (ref 22–32)
Calcium: 9.1 mg/dL (ref 8.9–10.3)
Chloride: 109 mmol/L (ref 98–111)
Creatinine, Ser: 1.93 mg/dL — ABNORMAL HIGH (ref 0.44–1.00)
GFR, Estimated: 24 mL/min — ABNORMAL LOW (ref >60)
Glucose, Bld: 102 mg/dL — ABNORMAL HIGH (ref 70–99)
Potassium: 4.7 mmol/L (ref 3.5–5.1)
Sodium: 140 mmol/L (ref 135–145)

## 2022-04-15 LAB — TSH: TSH: 2.999 u[IU]/mL (ref 0.350–4.500)

## 2022-04-15 MED ORDER — DEXTROSE-NACL 5-0.9 % IV SOLN
INTRAVENOUS | Status: AC
  Administered 2022-04-15: 500 mL via INTRAVENOUS

## 2022-04-15 MED ORDER — SODIUM CHLORIDE 0.9 % IV BOLUS
500.0000 mL | Freq: Once | INTRAVENOUS | Status: AC
  Administered 2022-04-15: 500 mL via INTRAVENOUS

## 2022-04-15 MED ORDER — SODIUM ZIRCONIUM CYCLOSILICATE 5 G PO PACK
5.0000 g | PACK | Freq: Every day | ORAL | Status: DC
Start: 2022-04-16 — End: 2022-04-16
  Filled 2022-04-15: qty 1

## 2022-04-15 MED ORDER — OLANZAPINE 10 MG IM SOLR
5.0000 mg | Freq: Once | INTRAMUSCULAR | Status: AC
  Administered 2022-04-15: 5 mg via INTRAMUSCULAR
  Filled 2022-04-15: qty 10

## 2022-04-15 MED ORDER — OLANZAPINE 10 MG IM SOLR
5.0000 mg | Freq: Once | INTRAMUSCULAR | Status: AC | PRN
  Administered 2022-04-16: 5 mg via INTRAMUSCULAR
  Filled 2022-04-15: qty 10

## 2022-04-15 NOTE — Progress Notes (Signed)
Pt became severely agitated, is refusing all care, will not take the oral Zyprexa, is trying to hit staff. Plan to give a dose of IM Zyprexa.

## 2022-04-15 NOTE — Progress Notes (Addendum)
PROGRESS NOTE [name] [name]  MVE:[id number] DOB: Jun 14, 1930 DOA: 04/04/2022 PCP: Pcp, No   Brief Narrative/Hospital Course: 34 yof with T2DM, HLD, HTN, CKD stage IV, dementia, DNR presented with altered mental status became bradycardic with heart rate in 30s to 40s hypothermic hypotensive with lactic acidosis admitted for septic shock initially intubated and admitted to ICU subsequently extubated managed IV antibiotics for UTI, sepsis resolved transferred to [facility] 5/29 Antibiotic converted to ceftriaxone, doing well, off pressors.  Has had elevated potassium but complicated course with refusal for Lokelma. 6/3 am further worsening of potassium given temporizing measures-insulin and dextrose plus Lokelma.she has been agitated psych was consulted.  Patient has been refusing medication care intermittently at times agitated needing sedatives.  Potassium subsequently normalized with Lokelma daily.  Subjective: Seen and examined this morning, patient having sinus bradycardia in 30s with first-degree heart block but mostly asymptomatic, alert awake confused.  Oriented to current place reports she wants to go home.  Assessment and Plan: Principal Problem:   Septic shock ([place]) Active Problems:   Acute metabolic encephalopathy   Hyperlipidemia   Acute lower UTI   Anemia   Dementia with behavioral disturbance   Hyperkalemia   Citrobacter infection   Hypoglycemia   Septic shock due to UTI  UTI due to Citrobacter: Patient was intubated, on pressors in ICU>septic shock resolved.  Doing well on room air.Urine culture with Citrobacter, completed ceftriaxone 12/18/34   Acute metabolic encephalopathy Dementia with behavioral disturbance agitation: Patient presented with confusion worse from baseline.  Patient has been having intermittent agitation.continue sedatives as ordered p.o./IM Zyprexa, was seen by psychiatry appreciate input.  Continue  melatonin at bedtime,Depakote 250 3 times daily.On seroquel increased  from 25 to 50 mg. Cont delirium precaution, fall precaution supportive care, re-orientation.  Hyperlipidemia-cont statin  Anemia of chronic disease-hb stable,monitor. Recent Labs  Lab 04/10/22 0243  HGB 10.4*  HCT 33.7*   First-degree AV block: Bradycardia -having heart rate in 30s, notified cardiology for official consultation.  Monitoring telemetry mostly symptomatic. Echo with EF 50-55% avoid beta-blocker,  Hyperkalemia: Potassium has been persistently elevated, today normalized 4.7 we will keep on Lokelma.  Need close follow-up.  Continue low potassium/renal diet. Recent Labs  Lab 04/12/22 1432 04/12/22 1920 04/13/22 0308 04/14/22 0533 04/15/22 0537  K 5.7* 6.0* 5.1  5.2* 5.5* 4.7    Thrombocytopenia in the setting of sepsis. Recent Labs  Lab 04/10/22 0243  PLT 126*    Hypertension BP stable.  Not on antihypertensive  AKI on CKD stage IV: creat has been up holding in 1.82 g likely the patient's baseline.Discontinue IV fluids, encourage oral intake. Recent Labs  Lab 04/11/22 0144 04/12/22 0219 04/13/22 0308 04/14/22 0533 04/15/22 0537  BUN 33* 35* 39* 38* 38*  CREATININE 1.87* 1.79* 2.00* 2.12* 1.93*    Generalized weakness/debility:Cont PTOT Type 2 diabetes mellitus :sugars controlled, had hypoglycemia after insulin for hyperkalemia patient sugar is well maintained . Wean down ivf, will stop at 6 am. Recent Labs  Lab 04/15/22 0023 04/15/22 0503 04/15/22 0733 04/15/22 0827 04/15/22 1117  GLUCAP 84 102* 100* 117* 121*    Goals of care/DNR.  Cont supportive care fall precaution. Addendum: Seen this afternoon urgently as patient was minimally responsive heart rate up to 20s, she was able to wake up after physical stimuli blood pressure 120s, given 500 cc bolus.  More alert awake.  Discussed with cardiology Dr Gardiner Rhyme who came at bedside to evaluate her not a candidate for pacemaker and cards encouraged to discuss  goals of care - hospice.  I called patient's  granddaughter and spoke with daughter as well in three-way conversation discussed the challenges were having with patient not being compliant with medication and also her electrolytes imbalance esp hyperkalemia, and now with bradycardia and metabolic encephalopathy in the setting of dementia.  Overall poor prognosis is poor, not a candidate for pacemaker.  They understand the situation and they agree for the patient to return with [name] under hospice services but at this time daughter not ready for inpatient hospice, palliative care consult is also placed again , was seen previously on admission.  They understand that she may not do well and remains critically ill.Pt's RN Rey witnessed phone conversation/confirmed  DVT prophylaxis: heparin injection 5,000 Units Start: 04/05/22 0600 SCDs Start: 04/05/22 0532 Code Status:   Code Status: DNR Family Communication: plan of care discussed with patient at bedside.  No family in the room-I had  called Grandaughter [name] [name] no answer- vm full, called Daughter [name] [name] in 2 different phone numbers-no answer/ mail box is full- sent unit no as sms, have not heard yet.  Patient status is: inpatient because of ongpoing management of hyperkalemia aki, deconditioning, bradycardia Level of care: Progressive   Dispo: The patient is from: [facility]            Anticipated disposition: SNF tomorrow once heart rate improves and potassium remains stable   Mobility Assessment (last 72 hours)     Mobility Assessment     Row Name 04/15/22 1151 04/15/22 0741 04/14/22 0802 04/13/22 2000 04/13/22 1930   Does patient have an order for bedrest or is patient medically unstable -- Yes- Bedfast (Level 1) - Complete Yes- Bedfast (Level 1) - Complete Yes- Bedfast (Level 1) - Complete Yes- Bedfast (Level 1) - Complete   What is the highest level of mobility based on the progressive mobility assessment? Level 1 (Bedfast) - Unable to balance while sitting on edge of bed Level 1  (Bedfast) - Unable to balance while sitting on edge of bed Level 1 (Bedfast) - Unable to balance while sitting on edge of bed Level 1 (Bedfast) - Unable to balance while sitting on edge of bed Level 1 (Bedfast) - Unable to balance while sitting on edge of bed   Is the above level different from baseline mobility prior to current illness? -- Yes - Recommend PT order Yes - Recommend PT order Yes - Recommend PT order Yes - Recommend PT order    Row Name 04/13/22 1100 04/12/22 2000 04/12/22 1400       Does patient have an order for bedrest or is patient medically unstable No - Continue assessment Yes- Bedfast (Level 1) - Complete --     What is the highest level of mobility based on the progressive mobility assessment? Level 3 (Stands with assist) - Balance while standing  and cannot march in place Level 1 (Bedfast) - Unable to balance while sitting on edge of bed Level 4 (Walks with assist in room) - Balance while marching in place and cannot step forward and back - Complete     Is the above level different from baseline mobility prior to current illness? Yes - Recommend PT order Yes - Recommend PT order --             Objective: Vitals last 24 hrs: Vitals:   04/14/22 0412 04/14/22 2008 04/15/22 0506 04/15/22 0802  BP:  (!) 133/57 (!) 131/57 (!) 133/54  Pulse:  (!) 48 Marland Kitchen)  35 (!) 32  Resp:  15 15   Temp:  (!) 97.3 F (36.3 C)    TempSrc:  Axillary    SpO2:  95% 92%   Weight: 91.5 kg  98.2 kg   Height:       Weight change: 6.7 kg  Physical Examination: General exam: Aax1-2, older than stated age, weak appearing. HEENT:Oral mucosa moist, Ear/Nose WNL grossly, dentition normal. Respiratory system: bilaterally clear, no use of accessory muscle Cardiovascular system: S1 & S2 +, No JVD,. Gastrointestinal system: Abdomen soft,NT,ND,BS+ Nervous System:Alert, awake, moving extremities and grossly nonfocal Extremities: LE ankle edema neg, distal peripheral pulses palpable.  Skin: No  rashes,no icterus. MSK: Normal muscle bulk,tone, power    Medications reviewed:  Scheduled Meds:  atorvastatin  20 mg Oral Daily   chlorhexidine  15 mL Mouth Rinse BID   insulin aspart  5 Units Intravenous Once   And   dextrose  1 ampule Intravenous Once   divalproex  250 mg Oral TID   heparin  5,000 Units Subcutaneous Q8H   insulin aspart  0-15 Units Subcutaneous Q4H   mouth rinse  15 mL Mouth Rinse q12n4p   melatonin  3 mg Oral QHS   pantoprazole  40 mg Oral Daily   QUEtiapine  50 mg Oral Daily   sodium zirconium cyclosilicate  10 g Oral Daily   Continuous Infusions:  sodium chloride 10 mL/hr at 04/06/22 1000   dextrose 5 % and 0.9% NaCl 75 mL/hr at 04/15/22 0115      Diet Order             DIET DYS 3 Room service appropriate? No; Fluid consistency: Thin; Fluid restriction: Other (see comments)  Diet effective now                  Intake/Output Summary (Last 24 hours) at 04/15/2022 1314 Last data filed at 04/15/2022 0508 Gross per 24 hour  Intake 1015.57 ml  Output 200 ml  Net 815.57 ml   Net IO Since Admission: 2,249.14 mL [04/15/22 1314]  Wt Readings from Last 3 Encounters:  04/15/22 98.2 kg  10/13/21 89.9 kg  10/01/21 89.9 kg     Unresulted Labs (From admission, onward)     Start     Ordered   04/10/22 7616  Basic metabolic panel  Daily,   R     Question:  Specimen collection method  Answer:  Lab=Lab collect   04/09/22 1455          Data Reviewed: I have personally reviewed following labs and imaging studies CBC: Recent Labs  Lab 04/10/22 0243  WBC 6.7  HGB 10.4*  HCT 33.7*  MCV 100.3*  PLT 073*   Basic Metabolic Panel: Recent Labs  Lab 04/10/22 0243 04/11/22 0144 04/12/22 0219 04/12/22 0415 04/12/22 1432 04/12/22 1920 04/13/22 0308 04/14/22 0533 04/15/22 0537  NA 140 138 137  --   --   --  135 139 140  K 5.5* 5.5* 6.3*   < > 5.7* 6.0* 5.1  5.2* 5.5* 4.7  CL 107 107 105  --   --   --  102 109 109  CO2 28 27 26   --   --   --  27  25 26   GLUCOSE 92 207* 88  --  71  --  136* 98 102*  BUN 32* 33* 35*  --   --   --  39* 38* 38*  CREATININE 1.88* 1.87* 1.79*  --   --   --  2.00* 2.12* 1.93*  CALCIUM 9.6 9.1 9.5  --   --   --  9.1 9.0 9.1  MG 1.6* 1.5*  --   --   --   --   --   --   --   PHOS 4.0 3.8 4.1  --   --   --   --   --   --    < > = values in this interval not displayed.   GFR: Estimated Creatinine Clearance: 25.4 mL/min (A) (by C-G formula based on SCr of 1.93 mg/dL (H)). Liver Function Tests: Recent Labs  Lab 04/11/22 0144 04/12/22 0219  ALBUMIN 2.4* 2.9*   No results for input(s): LIPASE, AMYLASE in the last 168 hours. No results for input(s): AMMONIA in the last 168 hours. Coagulation Profile: No results for input(s): INR, PROTIME in the last 168 hours. BNP (last 3 results) No results for input(s): PROBNP in the last 8760 hours. HbA1C: No results for input(s): HGBA1C in the last 72 hours. CBG: Recent Labs  Lab 04/15/22 0023 04/15/22 0503 04/15/22 0733 04/15/22 0827 04/15/22 1117  GLUCAP 84 102* 100* 117* 121*   Lipid Profile: No results for input(s): CHOL, HDL, LDLCALC, TRIG, CHOLHDL, LDLDIRECT in the last 72 hours. Thyroid Function Tests: No results for input(s): TSH, T4TOTAL, FREET4, T3FREE, THYROIDAB in the last 72 hours. Sepsis Labs: No results for input(s): PROCALCITON, LATICACIDVEN in the last 168 hours.  No results found for this or any previous visit (from the past 240 hour(s)).   Antimicrobials: Anti-infectives (From admission, onward)    Start     Dose/Rate Route Frequency Ordered Stop   04/05/22 0145  cefTRIAXone (ROCEPHIN) 2 g in sodium chloride 0.9 % 100 mL IVPB        2 g 200 mL/hr over 30 Minutes Intravenous Every 24 hours 04/05/22 0144 04/11/22 0100      Culture/Microbiology    Component Value Date/Time   SDES BLOOD RIGHT ARM 04/05/2022 0037   SPECREQUEST  04/05/2022 0037    BOTTLES DRAWN AEROBIC AND ANAEROBIC Blood Culture adequate volume   CULT  04/05/2022  0037    NO GROWTH 5 DAYS Performed at [facility], [place] [address], [place], [place] [address]    REPTSTATUS 04/10/2022 FINAL 04/05/2022 0037   Radiology Studies: No results found.   LOS: 10 days   Antonieta Pert, MD [facility]  04/15/2022, 1:14 PM

## 2022-04-15 NOTE — Progress Notes (Addendum)
0900 : Pt had pulled out her IV access. Noted blood on her lap area and IV cannula right beside it. IV team order placed for IV re-access. Will need to place mittens once access is back.  PT HR had gone down to as low as 35. Currently at 58 with physical therapy doing Passive range of motion . Pt is responding, interacting and conversing. Pt states she is " alright  " , No chest pain, not feeling anything funny " . BP 133/54.

## 2022-04-15 NOTE — Consult Note (Signed)
Cardiology Consultation:   Patient ID: [name] [name] MRN: [id number]; DOB: September 13, 1930  Admit date: 04/04/2022 Date of Consult: 04/15/2022  PCP:  Merryl Hacker, No   [facility] Providers Cardiologist:  None        Patient Profile:   [name] [name] is a 88 y.o. female with a hx of T2DM, CAD stage IV, dementia, hypertension who is being seen 04/15/2022 for the evaluation of bradycardia at the request of Dr. Lupita Leash.  History of Present Illness:   Ms. [name] [name] is a 88 year old female with history of T2DM, CAD stage IV, dementia, hypertension who we are consulted by Dr. Lupita Leash for evaluation of bradycardia.  She was initially admitted with septic shock and required intubation.  She was treated for UTI, sepsis resolved and she was transferred out of ICU on 5/29.  Noted to have heart rates in 30s to 40s.  Patient has been refusing medications and had intermittent agitation.  Has been bradycardic in 30s to 40s.  EKG today shows sinus bradycardia, rate 41, first-degree AV block.  Has been normotensive.  She is oriented to person   Past Medical History:  Diagnosis Date   Diabetes mellitus    Hyperlipidemia    Hypertension     Past Surgical History:  Procedure Laterality Date   APPENDECTOMY     CHOLECYSTECTOMY     COLONOSCOPY  06/23/2012   Procedure: COLONOSCOPY;  Surgeon: Lear Ng, MD;  Location: [place];  Service: Endoscopy;  Laterality: N/A;   ESOPHAGOGASTRODUODENOSCOPY  06/23/2012   Procedure: ESOPHAGOGASTRODUODENOSCOPY (EGD);  Surgeon: Lear Ng, MD;  Location: [facility] END[facility]COPY;  Service: Endoscopy;  Laterality: N/A;      Inpatient Medications: Scheduled Meds:  atorvastatin  20 mg Oral Daily   chlorhexidine  15 mL Mouth Rinse BID   insulin aspart  5 Units Intravenous Once   And   dextrose  1 ampule Intravenous Once   heparin  5,000 Units Subcutaneous Q8H   insulin aspart  0-15 Units Subcutaneous Q4H   mouth rinse  15 mL Mouth Rinse q12n4p   melatonin  3 mg Oral QHS   pantoprazole   40 mg Oral Daily   [START ON 04/16/2022] sodium zirconium cyclosilicate  5 g Oral Daily   Continuous Infusions:  sodium chloride 10 mL/hr at 04/06/22 1000   dextrose 5 % and 0.9% NaCl 500 mL (04/15/22 1618)   PRN Meds: acetaminophen, docusate sodium, OLANZapine, polyethylene glycol  Allergies:    Allergies  Allergen Reactions   Alendronate     Other reaction(s): Other (See Comments) Poor renal function.    Social History:   Social History   Socioeconomic History   Marital status: Widowed    Spouse name: Not on file   Number of children: Not on file   Years of education: Not on file   Highest education level: Not on file  Occupational History   Not on file  Tobacco Use   Smoking status: Never   Smokeless tobacco: Never  Vaping Use   Vaping Use: Never used  Substance and Sexual Activity   Alcohol use: No   Drug use: No   Sexual activity: Not on file  Other Topics Concern   Not on file  Social History Narrative   Not on file   Social Determinants of Health   Financial Resource Strain: Not on file  Food Insecurity: Not on file  Transportation Needs: Not on file  Physical Activity: Not on file  Stress: Not on file  Social Connections: Not  on file  Intimate Partner Violence: Not on file    Family History:   History reviewed. No pertinent family history.   ROS:  Please see the history of present illness.   All other ROS reviewed and negative.     Physical Exam/Data:   Vitals:   04/14/22 2008 04/15/22 0506 04/15/22 0802 04/15/22 1346  BP: (!) 133/57 (!) 131/57 (!) 133/54 121/66  Pulse: (!) 48 (!) 35 (!) 32 (!) 41  Resp: 15 15  16   Temp: (!) 97.3 F (36.3 C)     TempSrc: Axillary     SpO2: 95% 92%    Weight:  98.2 kg    Height:        Intake/Output Summary (Last 24 hours) at 04/15/2022 1623 Last data filed at 04/15/2022 1539 Gross per 24 hour  Intake 1015.57 ml  Output 650 ml  Net 365.57 ml      04/15/2022    5:06 AM 04/14/2022    4:12 AM 04/13/2022     4:03 AM  Last 3 Weights  Weight (lbs) 216 lb 7.9 oz 201 lb 11.5 oz 202 lb 13.2 oz  Weight (kg) 98.2 kg 91.5 kg 92 kg     Body mass index is 29.36 kg/m.  General: Somnolent but arousable, oriented x1 HEENT: normal Neck: no JVD Cardiac:   Bradycardic, regular, no murmur  Lungs:  clear to auscultation bilaterally, no wheezing, rhonchi or rales  Abd: soft, nontender Ext: no edema Musculoskeletal:  No deformities, BUE and BLE strength normal and equal Skin: warm and dry  Neuro:  Oriented to person Psych: Unable to assess  EKG:  The EKG was personally reviewed and demonstrates:  sinus bradycardia, rate 41, first-degree AV block Telemetry:  Telemetry was personally reviewed and demonstrates: Bradycardia to 30s to 40s  Relevant CV Studies:   Laboratory Data:  High Sensitivity Troponin:   Recent Labs  Lab 04/04/22 2318 04/05/22 0725  TROPONINIHS 14 15     Chemistry Recent Labs  Lab 04/10/22 0243 04/11/22 0144 04/12/22 0219 04/13/22 0308 04/14/22 0533 04/15/22 0537  NA 140 138   < > 135 139 140  K 5.5* 5.5*   < > 5.1  5.2* 5.5* 4.7  CL 107 107   < > 102 109 109  CO2 28 27   < > 27 25 26   GLUCOSE 92 207*   < > 136* 98 102*  BUN 32* 33*   < > 39* 38* 38*  CREATININE 1.88* 1.87*   < > 2.00* 2.12* 1.93*  CALCIUM 9.6 9.1   < > 9.1 9.0 9.1  MG 1.6* 1.5*  --   --   --   --   GFRNONAA 25* 25*   < > 23* 22* 24*  ANIONGAP 5 4*   < > 6 5 5    < > = values in this interval not displayed.    Recent Labs  Lab 04/11/22 0144 04/12/22 0219  ALBUMIN 2.4* 2.9*   Lipids No results for input(s): CHOL, TRIG, HDL, LABVLDL, LDLCALC, CHOLHDL in the last 168 hours.  Hematology Recent Labs  Lab 04/10/22 0243  WBC 6.7  RBC 3.36*  HGB 10.4*  HCT 33.7*  MCV 100.3*  MCH 31.0  MCHC 30.9  RDW 13.7  PLT 126*   Thyroid  Recent Labs  Lab 04/15/22 0537  TSH 2.999    BNPNo results for input(s): BNP, PROBNP in the last 168 hours.  DDimer No results for input(s): DDIMER in the last  168 hours.   Radiology/Studies:  No results found.   Assessment and Plan:   Bradycardia: Having sinus bradycardia in 30s to 40s with first-degree AV block.  Given her age and comorbidities, including dementia, she is not a candidate for pacemaker.  Recommend palliative evaluation. She seems to be tolerating the bradycardia, BP has been stable.  Avoid AV nodal blocking agents.    For questions or updates, please contact [place] Please consult [email or web address] for contact info under    Signed, Donato Heinz, MD  04/15/2022 4:23 PM

## 2022-04-15 NOTE — Progress Notes (Signed)
Pt became agitated attempted to hit staff after attempting to changed pt's soiled bed pad. Pt has been refusing PO meds at this time as well. Paged Dr. Myna Hidalgo about pt and new order received to give pt a one time dose of Zyprexa IM. See MAR for details. Will reevaluate pt after medication has taken effect.

## 2022-04-15 NOTE — Progress Notes (Signed)
Physical Therapy Treatment Patient Details Name: [name] [name] MRN: [id number] DOB: 05-20-1930 Today's Date: 04/15/2022   History of Present Illness 88 y.o. female admitted ED 5/26 with AMS, UTI, sepsis, VDRF and bradycardia.  PMHx: DM2, HLD, HTN, CKD 4, dementia    PT Comments    Pt pleasant with flat affect and responding to questions and commands. Pt with HR 50 on initial arrival and even with stimulation and interaction pt with continued bradycardia with HR 32-50 during session without consistent increase in rate with HEP. RN made aware and present with mobility deferred due to HR. Will continue to follow and assess for progression.     Recommendations for follow up therapy are one component of a multi-disciplinary discharge planning process, led by the attending physician.  Recommendations may be updated based on patient status, additional functional criteria and insurance authorization.  Follow Up Recommendations  Skilled nursing-short term rehab (<3 hours/day)     Assistance Recommended at Discharge Frequent or constant Supervision/Assistance  Patient can return home with the following A lot of help with walking and/or transfers;A lot of help with bathing/dressing/bathroom;Assistance with cooking/housework;Direct supervision/assist for medications management;Direct supervision/assist for financial management;Assist for transportation;Help with stairs or ramp for entrance   Equipment Recommendations  None recommended by PT    Recommendations for Other Services       Precautions / Restrictions Precautions Precautions: Fall;Other (comment) Precaution Comments: watch HR     Mobility  Bed Mobility               General bed mobility comments: deferred secondary to bradycardia with HR 32-50 throughout session with RN aware    Transfers                        Ambulation/Gait                   Stairs             Wheelchair Mobility    Modified  Rankin (Stroke Patients Only)       Balance                                            Cognition Arousal/Alertness: Awake/alert Behavior During Therapy: Flat affect Overall Cognitive Status: No family/caregiver present to determine baseline cognitive functioning Area of Impairment: Attention                   Current Attention Level: Focused Memory: Decreased short-term memory Following Commands: Follows one step commands with increased time Safety/Judgement: Decreased awareness of safety, Decreased awareness of deficits   Problem Solving: Requires verbal cues, Requires tactile cues, Slow processing General Comments: pt with flat affect with focused attention able to state name and respond to questions appropriately but very slow processing and unable to recognize deficits        Exercises General Exercises - Upper Extremity Shoulder Flexion: AAROM, Both, Supine, 10 reps Elbow Flexion: AAROM, Both, 10 reps, Supine Elbow Extension: AAROM, Both, 10 reps, Supine General Exercises - Lower Extremity Short Arc Quad: AAROM, Both, Supine, 15 reps Heel Slides: AAROM, Both, 15 reps, Supine    General Comments        Pertinent Vitals/Pain Pain Assessment Pain Assessment: No/denies pain    Home Living  Prior Function            PT Goals (current goals can now be found in the care plan section) Progress towards PT goals: Not progressing toward goals - comment (limited by bradycardia)    Frequency    Min 2X/week      PT Plan Current plan remains appropriate    Co-evaluation              AM-PAC PT "6 Clicks" Mobility   Outcome Measure  Help needed turning from your back to your side while in a flat bed without using bedrails?: A Lot Help needed moving from lying on your back to sitting on the side of a flat bed without using bedrails?: A Lot Help needed moving to and from a bed to a chair (including  a wheelchair)?: Total Help needed standing up from a chair using your arms (e.g., wheelchair or bedside chair)?: Total Help needed to walk in hospital room?: Total Help needed climbing 3-5 steps with a railing? : Total 6 Click Score: 8    End of Session   Activity Tolerance: Patient limited by fatigue Patient left: in bed;with call bell/phone within reach;with nursing/sitter in room;with bed alarm set Nurse Communication: Mobility status PT Visit Diagnosis: Muscle weakness (generalized) (M62.81);Other abnormalities of gait and mobility (R26.89)     Time: 5462-7035 PT Time Calculation (min) (ACUTE ONLY): 23 min  Charges:  $Therapeutic Exercise: 23-37 mins                     Teruko Joswick P, PT Acute Rehabilitation Services Pager: [phone] Office: [phone]    Tameia Rafferty B Jaiona Simien 04/15/2022, 11:53 AM

## 2022-04-15 NOTE — TOC Progression Note (Signed)
Transition of Care [facility]) - Progression Note    Patient Details  Name: [name] [name] MRN: [id number] Date of Birth: 1930/04/25  Transition of Care [facility]) CM/SW [place], [place] Phone Number: 04/15/2022, 2:48 PM  Clinical Narrative:    RN notified CSW that MD and family are now wanting pt to return back to [facility] with Hospice services. CSW followed up with facility who noted this should not be a problem as she is LT, but will follow up with DON just to confirm and will call CSW back. TOC will continue to follow for DC needs.   Expected Discharge Plan: [place]    Expected Discharge Plan and Services Expected Discharge Plan: [place] arrangements for the past 2 months: [place]                                       Social Determinants of Health (SDOH) Interventions    Readmission Risk Interventions    09/12/2021    2:32 PM  Readmission Risk Prevention Plan  Transportation Screening Complete  Medication Review Press photographer) Complete  SW Recovery Care/Counseling Consult Complete  Palliative Care Screening Complete  Skilled Nursing Facility Complete

## 2022-04-16 DIAGNOSIS — G9341 Metabolic encephalopathy: Secondary | ICD-10-CM | POA: Diagnosis not present

## 2022-04-16 DIAGNOSIS — F03918 Unspecified dementia, unspecified severity, with other behavioral disturbance: Secondary | ICD-10-CM

## 2022-04-16 DIAGNOSIS — R6521 Severe sepsis with septic shock: Secondary | ICD-10-CM | POA: Diagnosis not present

## 2022-04-16 DIAGNOSIS — A419 Sepsis, unspecified organism: Secondary | ICD-10-CM | POA: Diagnosis not present

## 2022-04-16 LAB — BASIC METABOLIC PANEL
Anion gap: 9 (ref 5–15)
BUN: 33 mg/dL — ABNORMAL HIGH (ref 8–23)
CO2: 23 mmol/L (ref 22–32)
Calcium: 9.4 mg/dL (ref 8.9–10.3)
Chloride: 109 mmol/L (ref 98–111)
Creatinine, Ser: 1.55 mg/dL — ABNORMAL HIGH (ref 0.44–1.00)
GFR, Estimated: 32 mL/min — ABNORMAL LOW (ref >60)
Glucose, Bld: 101 mg/dL — ABNORMAL HIGH (ref 70–99)
Potassium: 5.3 mmol/L — ABNORMAL HIGH (ref 3.5–5.1)
Sodium: 141 mmol/L (ref 135–145)

## 2022-04-16 LAB — GLUCOSE, CAPILLARY
Glucose-Capillary: 89 mg/dL (ref 70–99)
Glucose-Capillary: 131 mg/dL — ABNORMAL HIGH (ref 70–99)
Glucose-Capillary: 140 mg/dL — ABNORMAL HIGH (ref 70–99)
Glucose-Capillary: 106 mg/dL — ABNORMAL HIGH (ref 70–99)
Glucose-Capillary: 106 mg/dL — ABNORMAL HIGH (ref 70–99)

## 2022-04-16 MED ORDER — SODIUM ZIRCONIUM CYCLOSILICATE 10 G PO PACK
10.0000 g | PACK | Freq: Every day | ORAL | Status: DC
  Administered 2022-04-17: 10 g via ORAL
  Filled 2022-04-16 (×3): qty 1

## 2022-04-16 NOTE — Progress Notes (Signed)
Pt refusing to have CBG check and VS. Attempted by nurse and nurse tech multiple times. Pt is non monitored at this time as well. Pt is very combative attempting to hit staff when approaching pt. Discussed with Dr. Myna Hidalgo and new orders received to give pt IM Zyprexa 5 mg as needed for one time dose. An order for labs also placed on pt as well by MD. Will group pt's care after administration of Zyprexa so labs may be obtained as well in the AM.

## 2022-04-16 NOTE — Progress Notes (Signed)
Daily Progress Note   Patient Name: [name] [name]       Date: 04/16/2022 DOB: 12/10/1929  Age: 88 y.o. MRN#: [id number] Attending Physician: Mariel Aloe, MD Primary Care Physician: Pcp, No Admit Date: 04/04/2022  Reason for Consultation/Follow-up: Establishing goals of care  Subjective: Medical records reviewed including progress notes, labs. Per notes, patient's family is now interested in pursuing hospice at [facility].  Called patient's daughter [name] [name] and was unable to reach.   Called patient's granddaughter [name] [name], who clarifies that her mother is still not ready to discontinue any life-prolonging medications. She tells me that [name] [name] would be agreeable to additional staff and support provided by hospice to avoid rehospitalization, but she is ready for a full comfort path or hospice philosophy of prioritizing quality of life and comfort as the natural disease process continued. [name] [name] understands that the patient is suffering and hospice is appropriate, but she does not want to push her mother to make decisions, as she is currently dealing with her own health issues and treatments for cancer. Emotional support and therapeutic support was provided as [name] [name] shared the difficulty she has had with getting more help to care for the patient and the agitation she expresses even at home.  We discussed that patient will need to go 24 hours without combative behaviors before returning to SNF. Discussed palliative care referral currently in place and that they will be able to continue assessing patient for further decline and possible transition to hospice when her mother is ready and accepting of the philosophy.  Questions and concerns addressed. PMT will continue to support holistically.   Length of  Stay: 11    Vital Signs: BP (!) 158/69   Pulse 75   Temp (!) 97.3 F (36.3 C) (Axillary)   Resp 16   Ht 6' (1.829 m)   Wt 98.2 kg   SpO2 98%   BMI 29.36 kg/m  SpO2: SpO2: 98 % O2 Device: O2 Device: Room Air O2 Flow Rate: O2 Flow Rate (L/min): 1 L/min       Palliative Care Assessment & Plan   Patient Profile: Per intake H&P --> Aide Wojnar, is a 88 y.o. female, who [facility] ED with a chief complaint of AMS. They have a pertinent past medical history of DM2, HLD, HTN, CKD 4, dementia.  While at [facility] SNF patient became bradycardic with heart rate in the 30s to 40s, EMS was called. She was found to be hypothermic in the emergency department with a temperature of 89.1 F, 59/39, WBC 5.3, lactic 2.2 > 3.7.  A code sepsis was called.  Blood cultures were ordered.  She was started on ceftriaxone and sent to the ICU for pressor support. She has been weaned off pressors and is now stable and transitioning to the post acute care unit.    Palliative care has been asked to get involved to further discuss goals of care.   Assessment: Goals of care conversation Dementia  Recommendations/Plan: DNR Continue current care  Per patient's granddaughter, daughter is still not ready to discontinue life-prolonging medications Palliative care referral currently in place remains aligned with [name] [name]  PMT remains available for acute needs.  Please secure chat or call team line should urgent needs arise  Prognosis:  Guarded   Discharge Planning: [place] for rehab with Palliative care service follow-up    Total time: I spent 35 minutes in the care of the patient today in the above activities and documenting the encounter.   [name], PA-C Palliative Medicine Team Team phone # [phone]  Thank you for allowing the Palliative Medicine Team to assist in the care of this patient. Please utilize secure chat with additional questions, if there is no  response within 30 minutes please call the above phone number.  Palliative Medicine Team providers are available by phone from 7am to 7pm daily and can be reached through the team cell phone.  Should this patient require assistance outside of these hours, please call the patient's attending physician.

## 2022-04-16 NOTE — Plan of Care (Signed)
  Problem: Clinical Measurements: Goal: Respiratory complications will improve Outcome: Progressing Goal: Cardiovascular complication will be avoided Outcome: Not Progressing   Problem: Activity: Goal: Risk for activity intolerance will decrease Outcome: Not Progressing   Problem: Nutrition: Goal: Adequate nutrition will be maintained Outcome: Not Progressing   Problem: Coping: Goal: Level of anxiety will decrease Outcome: Not Progressing   Problem: Elimination: Goal: Will not experience complications related to urinary retention Outcome: Progressing   Problem: Pain Managment: Goal: General experience of comfort will improve Outcome: Progressing   Problem: Skin Integrity: Goal: Risk for impaired skin integrity will decrease Outcome: Not Progressing

## 2022-04-16 NOTE — TOC Progression Note (Addendum)
Transition of Care [facility]) - Progression Note    Patient Details  Name: [name] [name] MRN: [id number] Date of Birth: Aug 15, 1930  Transition of Care [facility]) CM/SW [place], [place] Phone Number: 04/16/2022, 11:18 AM  Clinical Narrative:     Update- Patients insurance authorization has gone to a peer to peer please call [phone] option 5 will need patients name,DOB,member ID# [id number] deadline is 9:00am tomorrow 6/8. CSW informed MD.  CSW spoke with Crystal with [name] who informed CSW to check back with them tomorrow to see if they can accept patient back. Patient needs to go 24 hours without agitation or being combative before they can return.  CSW called and left [name] patients daughter Vm . CSW awaiting callback.CSW will continue to follow and assist with patients dc planning needs.   Expected Discharge Plan: [place]    Expected Discharge Plan and Services Expected Discharge Plan: [place] arrangements for the past 2 months: [place]                                       Social Determinants of Health (SDOH) Interventions    Readmission Risk Interventions    09/12/2021    2:32 PM  Readmission Risk Prevention Plan  Transportation Screening Complete  Medication Review Press photographer) Complete  SW Recovery Care/Counseling Consult Complete  Palliative Care Screening Complete  Skilled Nursing Facility Complete

## 2022-04-16 NOTE — Progress Notes (Signed)
PROGRESS NOTE    [name] [name]  TMA:[id number] DOB: [birth year] DOA: 04/04/2022 PCP: Pcp, No   Brief Narrative: 71 yof with T2DM, HLD, HTN, CKD stage IV, dementia, DNR presented with altered mental status became bradycardic with heart rate in 30s to 40s hypothermic hypotensive with lactic acidosis admitted for septic shock initially intubated and admitted to ICU subsequently extubated managed IV antibiotics for UTI, sepsis resolved transferred to [facility] 5/29 Antibiotic converted to ceftriaxone, doing well, off pressors.  Has had elevated potassium but complicated course with refusal for Lokelma. 6/3 am further worsening of potassium given temporizing measures-insulin and dextrose plus Lokelma.she has been agitated psych was consulted.  Patient has been refusing medication care intermittently at times agitated needing sedatives.  Potassium subsequently normalized with Lokelma daily.   Assessment and Plan:  Septic shock secondary to UTI UTI secondary to citrobacter infection Patient required ICU admission for septic shock. Documentation indicates intubation, however this does not appear to have occurred. For shock, patient received empiric Ceftriaxone IV for infection treatment and Levophed for vasopressor support. Vasopressor therapy weaned off successfully. Urine culture significant for citrobacter freundii and patient completed a 7 day course of Ceftriaxone. Resolved.  Hypothermia Present on admission. Secondary to above. Managed with warming blanket and treatment of sepsis. Resolved.  Hyperlipidemia -Continue Lipitor  Anemia of chronic disease Chronic. Stable.  First degree AV block Bradycardia Cardiology consulted for rates as low as 30 bpm. Transthoracic Echocardiogram with normal LVEF of 50-55%. Cardiology recommending no intervention, including no pacemaker. This plan was discussed with family. Recommendation for hospice which family are considering.  Hyperkalemia Persistent issue.  Asymptomatic. Patient started on [place]  Thrombocytopenia In setting of sepsis. Improving.  Primary hypertension Noted. Not on antihypertensive medications. Stable.  AKI on CKD stage IV Baseline creatinine of about 1.9-2.1. Creatinine of 2.40 on admission. Improved back to baseline with fluids.  Diabetes mellitus, type 2 Hemoglobin A1C of 5.7% -Continue SSI   DVT prophylaxis: Heparin subq Code Status:   Code Status: DNR Family Communication: None at bedside Disposition Plan: Discharge back to SNF in 24 hours   Consultants:  PCCM Psychiatry  Procedures:  Transthoracic Echocardiogram (5/27)  Antimicrobials: Ceftriaxone IV    Subjective: Patient reports wanting to go home. No concerns today.  Objective: BP 114/77 (BP Location: Right Arm)   Pulse 93   Temp (!) 97.3 F (36.3 C) (Axillary)   Resp 18   Ht 6' (1.829 m)   Wt 98.2 kg   SpO2 100%   BMI 29.36 kg/m   Examination:  General exam: Appears calm and comfortable Respiratory system: Clear to auscultation. Respiratory effort normal. Cardiovascular system: S1 & S2 heard, RRR. No murmurs, rubs, gallops or clicks. Gastrointestinal system: Abdomen is nondistended, soft and nontender. No organomegaly or masses felt. Normal bowel sounds heard. Central nervous system: Alert and oriented. No focal neurological deficits. Musculoskeletal: No edema. No calf tenderness Skin: No cyanosis. No rashes Psychiatry: Judgement and insight appear normal. Mood & affect appropriate.    Data Reviewed: I have personally reviewed following labs and imaging studies  CBC Lab Results  Component Value Date   WBC 6.7 04/10/2022   RBC 3.36 (L) 04/10/2022   HGB 10.4 (L) 04/10/2022   HCT 33.7 (L) 04/10/2022   MCV 100.3 (H) 04/10/2022   MCH 31.0 04/10/2022   PLT 126 (L) 04/10/2022   MCHC 30.9 04/10/2022   RDW 13.7 04/10/2022   LYMPHSABS 2.5 04/04/2022   MONOABS 0.7 04/04/2022   EOSABS 0.2  04/04/2022    BASOSABS 0.0 28/78/6767     Last metabolic panel Lab Results  Component Value Date   NA 141 04/16/2022   K 5.3 (H) 04/16/2022   CL 109 04/16/2022   CO2 23 04/16/2022   BUN 33 (H) 04/16/2022   CREATININE 1.55 (H) 04/16/2022   GLUCOSE 101 (H) 04/16/2022   GFRNONAA 32 (L) 04/16/2022   GFRAA 33 (L) 01/01/2015   CALCIUM 9.4 04/16/2022   PHOS 4.1 04/12/2022   PROT 7.1 04/04/2022   ALBUMIN 2.9 (L) 04/12/2022   BILITOT 0.4 04/04/2022   ALKPHOS 65 04/04/2022   AST 23 04/04/2022   ALT 28 04/04/2022   ANIONGAP 9 04/16/2022    GFR: Estimated Creatinine Clearance: 31.6 mL/min (A) (by C-G formula based on SCr of 1.55 mg/dL (H)).  No results found for this or any previous visit (from the past 240 hour(s)).    Radiology Studies: No results found.    LOS: 11 days    Cordelia Poche, MD [facility] 04/16/2022, 3:19 PM   If 7PM-7AM, please contact night-coverage [email or web address]

## 2022-04-17 LAB — POTASSIUM
Potassium: 5.7 mmol/L — ABNORMAL HIGH (ref 3.5–5.1)
Potassium: 5.6 mmol/L — ABNORMAL HIGH (ref 3.5–5.1)
Potassium: 5.6 mmol/L — ABNORMAL HIGH (ref 3.5–5.1)
Potassium: 5.6 mmol/L — ABNORMAL HIGH (ref 3.5–5.1)

## 2022-04-17 LAB — GLUCOSE, CAPILLARY
Glucose-Capillary: 123 mg/dL — ABNORMAL HIGH (ref 70–99)
Glucose-Capillary: 140 mg/dL — ABNORMAL HIGH (ref 70–99)
Glucose-Capillary: 111 mg/dL — ABNORMAL HIGH (ref 70–99)
Glucose-Capillary: 111 mg/dL — ABNORMAL HIGH (ref 70–99)

## 2022-04-17 LAB — NA AND K (SODIUM & POTASSIUM), RAND UR
Potassium Urine: 22 mmol/L
Sodium, Ur: 98 mmol/L

## 2022-04-17 MED ORDER — SODIUM ZIRCONIUM CYCLOSILICATE 10 G PO PACK
10.0000 g | PACK | Freq: Two times a day (BID) | ORAL | Status: DC
Start: 2022-04-17 — End: 2022-04-18
  Administered 2022-04-17 – 2022-04-18 (×2): 10 g via ORAL
  Filled 2022-04-17 (×3): qty 1

## 2022-04-17 MED ORDER — SODIUM POLYSTYRENE SULFONATE 15 GM/60ML PO SUSP
15.0000 g | Freq: Once | ORAL | Status: DC
  Filled 2022-04-17: qty 60

## 2022-04-17 MED ORDER — FUROSEMIDE 10 MG/ML IJ SOLN
40.0000 mg | Freq: Once | INTRAMUSCULAR | Status: AC
Start: 2022-04-17 — End: 2022-04-17
  Administered 2022-04-17: 40 mg via INTRAVENOUS
  Filled 2022-04-17: qty 4

## 2022-04-17 NOTE — Progress Notes (Signed)
PROGRESS NOTE    [name] [name]  VQM:[id number] DOB: 1930/02/17 DOA: 04/04/2022 PCP: Pcp, No   Brief Narrative: 72 yof with T2DM, HLD, HTN, CKD stage IV, dementia, DNR presented with altered mental status became bradycardic with heart rate in 30s to 40s hypothermic hypotensive with lactic acidosis admitted for septic shock initially intubated and admitted to ICU subsequently extubated managed IV antibiotics for UTI, sepsis resolved transferred to [facility] 5/29 Antibiotic converted to ceftriaxone, doing well, off pressors.  Has had elevated potassium but complicated course with refusal for Lokelma. 6/3 am further worsening of potassium given temporizing measures-insulin and dextrose plus Lokelma.she has been agitated psych was consulted.  Patient has been refusing medication care intermittently at times agitated needing sedatives.  Potassium subsequently normalized with Lokelma daily.   Assessment and Plan:  Septic shock secondary to UTI UTI secondary to citrobacter infection Patient required ICU admission for septic shock. Documentation indicates intubation, however this does not appear to have occurred. For shock, patient received empiric Ceftriaxone IV for infection treatment and Levophed for vasopressor support. Vasopressor therapy weaned off successfully. Urine culture significant for citrobacter freundii and patient completed a 7 day course of Ceftriaxone. Resolved.  Hypothermia Present on admission. Secondary to above. Managed with warming blanket and treatment of sepsis. Resolved.  Hyperlipidemia -Continue Lipitor  Anemia of chronic disease Chronic. Stable.  First degree AV block Bradycardia Cardiology consulted for rates as low as 30 bpm. Transthoracic Echocardiogram with normal LVEF of 50-55%. Cardiology recommending no intervention, including no pacemaker. This plan was discussed with family. Recommendation for hospice which family are considering.  Hyperkalemia Persistent issue.  Asymptomatic. Patient started on [facility] however she has declined administration many times. Lasix given with mild improvement of potassium -Continue Lokelma -Repeat potassium this evening  Thrombocytopenia In setting of sepsis. Improving.  Primary hypertension Noted. Not on antihypertensive medications. Stable.  AKI on CKD stage IV Baseline creatinine of about 1.9-2.1. Creatinine of 2.40 on admission. Improved back to baseline with fluids.  Diabetes mellitus, type 2 Hemoglobin A1C of 5.7% -Continue SSI   DVT prophylaxis: Heparin subq Code Status:   Code Status: DNR Family Communication: None at bedside Disposition Plan: Discharge back to SNF in 24 hours if able to get potassium decreased   Consultants:  PCCM Psychiatry  Procedures:  Transthoracic Echocardiogram (5/27)  Antimicrobials: Ceftriaxone IV    Subjective: No issues overnight per patient. Per nursing documentation, patient has declined multiple doses of Lokelma.  Objective: BP (!) 111/54 (BP Location: Right Arm)   Pulse 86   Temp 98.5 F (36.9 C) (Oral)   Resp 18   Ht 6' (1.829 m)   Wt 98.2 kg   SpO2 98%   BMI 29.36 kg/m   Examination:  General exam: Appears calm and comfortable  Respiratory system: Clear to auscultation. Respiratory effort normal. Cardiovascular system: S1 & S2 heard, Normal rate with regular rhythm. Gastrointestinal system: Abdomen is nondistended, soft and nontender. Normal bowel sounds heard. Central nervous system: Alert. No focal neurological deficits. Musculoskeletal: No calf tenderness Skin: No cyanosis. No rashes   Data Reviewed: I have personally reviewed following labs and imaging studies  CBC Lab Results  Component Value Date   WBC 6.7 04/10/2022   RBC 3.36 (L) 04/10/2022   HGB 10.4 (L) 04/10/2022   HCT 33.7 (L) 04/10/2022   MCV 100.3 (H) 04/10/2022   MCH 31.0 04/10/2022   PLT 126 (L) 04/10/2022   MCHC 30.9 04/10/2022   RDW 13.7 04/10/2022   LYMPHSABS 2.5  04/04/2022   MONOABS 0.7 04/04/2022   EOSABS 0.2 04/04/2022   BASOSABS 0.0 44/11/270     Last metabolic panel Lab Results  Component Value Date   NA 141 04/16/2022   K 5.6 (H) 04/17/2022   CL 109 04/16/2022   CO2 23 04/16/2022   BUN 33 (H) 04/16/2022   CREATININE 1.55 (H) 04/16/2022   GLUCOSE 101 (H) 04/16/2022   GFRNONAA 32 (L) 04/16/2022   GFRAA 33 (L) 01/01/2015   CALCIUM 9.4 04/16/2022   PHOS 4.1 04/12/2022   PROT 7.1 04/04/2022   ALBUMIN 2.9 (L) 04/12/2022   BILITOT 0.4 04/04/2022   ALKPHOS 65 04/04/2022   AST 23 04/04/2022   ALT 28 04/04/2022   ANIONGAP 9 04/16/2022    GFR: Estimated Creatinine Clearance: 31.6 mL/min (A) (by C-G formula based on SCr of 1.55 mg/dL (H)).  No results found for this or any previous visit (from the past 240 hour(s)).    Radiology Studies: No results found.    LOS: 12 days    Cordelia Poche, MD [facility] 04/17/2022, 2:29 PM   If 7PM-7AM, please contact night-coverage [email or web address]

## 2022-04-17 NOTE — Plan of Care (Signed)
  Problem: Clinical Measurements: Goal: Respiratory complications will improve Outcome: Progressing   Problem: Safety: Goal: Ability to remain free from injury will improve Outcome: Progressing   

## 2022-04-17 NOTE — TOC Progression Note (Addendum)
Transition of Care [facility]) - Progression Note    Patient Details  Name: [name] [name] MRN: [id number] Date of Birth: February 22, 1930  Transition of Care [facility]) CM/SW [place], [place] Phone Number: 04/17/2022, 11:17 AM  Clinical Narrative:      Update- Patients insurance peer to peer was denied. MD did not complete by deadline. Denial ID# [id number]. Crystal confirmed patient can return back to [facility] under [employer]. CSW updated patients daughter [name] [name] who is agreement with plan to return back to [facility] with palliative services when medically ready for dc.   CSW received callback from [place] with [name] [name] who confirmed patient can return back when medically ready for dc under her medicaid insurance if patients peer to peer is denied. CSW informed MD.   [name] [name] coming to assess patient today to see if they can accept patient back today for dc. Patients insurance appeal still pending. CSW will continue to follow and assist with patients dc planning needs. Expected Discharge Plan: [place]    Expected Discharge Plan and Services Expected Discharge Plan: [place] arrangements for the past 2 months: [place]                                       Social Determinants of Health (SDOH) Interventions    Readmission Risk Interventions    09/12/2021    2:32 PM  Readmission Risk Prevention Plan  Transportation Screening Complete  Medication Review Press photographer) Complete  SW Recovery Care/Counseling Consult Complete  Palliative Care Screening Complete  Skilled Nursing Facility Complete

## 2022-04-18 ENCOUNTER — Encounter (HOSPITAL_COMMUNITY): Payer: Self-pay | Admitting: Pulmonary Disease

## 2022-04-18 LAB — GLUCOSE, CAPILLARY
Glucose-Capillary: 119 mg/dL — ABNORMAL HIGH (ref 70–99)
Glucose-Capillary: 80 mg/dL (ref 70–99)
Glucose-Capillary: 81 mg/dL (ref 70–99)
Glucose-Capillary: 92 mg/dL (ref 70–99)

## 2022-04-18 LAB — POTASSIUM: Potassium: 5.3 mmol/L — ABNORMAL HIGH (ref 3.5–5.1)

## 2022-04-18 MED ORDER — SODIUM ZIRCONIUM CYCLOSILICATE 10 G PO PACK: 10.0000 g | PACK | Freq: Every day | ORAL | 0 refills | Status: AC

## 2022-04-18 MED ORDER — POLYETHYLENE GLYCOL 3350 17 G PO PACK: 17.0000 g | PACK | Freq: Every day | ORAL | Status: AC | PRN

## 2022-04-18 NOTE — Progress Notes (Signed)
Manufacturing engineer [facility]) Hospital Liaison Note  Notified by [facility] manager of patient/family request for [facility] palliative services at [facility] after discharge.   [facility] hospital liaison will follow patient for discharge disposition.   Please call with any hospice or outpatient palliative care related questions.   Thank you for the opportunity to participate in this patient's care.   Zollie Pee [facility] Liaison [phone]

## 2022-04-18 NOTE — Care Management Important Message (Signed)
Important Message  Patient Details  Name: [name] [name] MRN: [id number] Date of Birth: 1929/11/19   Medicare Important Message Given:  Yes     [name] 04/18/2022, 9:38 AM

## 2022-04-18 NOTE — Progress Notes (Signed)
Report given to Pascal at [address] [facility] by phone. [phone]. Questions answered, my phone number and name left with Pascal incase there are any further questions. Patient transferred to [facility] via [place].

## 2022-04-18 NOTE — TOC Transition Note (Signed)
Transition of Care [facility]) - CM/SW Discharge Note   Patient Details  Name: [name] [name] MRN: [id number] Date of Birth: Apr 13, 1930  Transition of Care [facility]) CM/SW Contact:  Milas Gain, [place] Phone Number: 04/18/2022, 10:41 AM   Clinical Narrative:     Patient will DC to: [place]   Anticipated DC date: 04/18/2022  Family notified: [name]  Transport by: Corey Harold  ?  Per MD patient ready for DC to [facility] with palliative services to follow . RN, patient,Shanita with [facility], patient's family, and facility notified of DC. Discharge Summary sent to facility. RN given number for report [employer]. DC packet on chart. DNR signed by MD attached to patients DC packet.Ambulance transport requested for patient.  CSW signing off.   Final next level of care: Skilled Nursing Facility Barriers to Discharge: No Barriers Identified   Patient Goals and CMS Choice Patient states their goals for this hospitalization and ongoing recovery are:: SNF CMS Medicare.gov Compare Post Acute Care list provided to:: Patient Represenative (must comment) (Patients daughter [name]) Choice offered to / list presented to : Adult Children (Patients daughter [name])  Discharge Placement              Patient chooses bed at: [facility] Patient to be transferred to facility by: [facility] Name of family member notified: [name] Patient and family notified of of transfer: 04/18/22  Discharge Plan and Services                                     Social Determinants of Health (SDOH) Interventions     Readmission Risk Interventions    04/17/2022   12:10 PM 09/12/2021    2:32 PM  Readmission Risk Prevention Plan  Transportation Screening Complete Complete  Medication Review (RN Care Manager)  Complete  HRI or Home Care Consult Complete   SW Recovery Care/Counseling Consult Complete Complete  Palliative Care Screening Complete Complete  Skilled Nursing Facility Complete Complete

## 2022-04-18 NOTE — Discharge Summary (Signed)
Physician Discharge Summary   Patient: [name] [name] MRN: [id number] DOB: 09/05/1930  Admit date:     04/04/2022  Discharge date: 04/18/22  Discharge Physician: Cordelia Poche, MD   PCP: Pcp, No   Recommendations at discharge:  Repeat BMP in 3 days to check potassium  Discharge Diagnoses: Principal Problem:   Septic shock ([place]) Active Problems:   Acute metabolic encephalopathy   Hyperlipidemia   Acute lower UTI   Anemia   Dementia with behavioral disturbance   Hyperkalemia   Citrobacter infection   Hypoglycemia  Resolved Problems:   * No resolved hospital problems. [facility]: 88 yof with T2DM, HLD, HTN, CKD stage IV, dementia, DNR presented with altered mental status became bradycardic with heart rate in 86s to 40s hypothermic hypotensive with lactic acidosis admitted for septic shock initially intubated and admitted to ICU subsequently extubated managed IV antibiotics for UTI, sepsis resolved transferred to [facility] 5/29 Antibiotic converted to ceftriaxone, doing well, off pressors.  Has had elevated potassium but complicated course with refusal for Lokelma. 6/3 am further worsening of potassium given temporizing measures-insulin and dextrose plus Lokelma.she has been agitated psych was consulted.  Patient has been refusing medication care intermittently at times agitated needing sedatives.  Potassium subsequently normalized with Lokelma daily.  Assessment and Plan: Septic shock secondary to UTI UTI secondary to citrobacter infection Patient required ICU admission for septic shock. Documentation indicates intubation, however this does not appear to have occurred. For shock, patient received empiric Ceftriaxone IV for infection treatment and Levophed for vasopressor support. Vasopressor therapy weaned off successfully. Urine culture significant for citrobacter freundii and patient completed a 7 day course of Ceftriaxone. Resolved.   Hypothermia Present on admission. Secondary to  above. Managed with warming blanket and treatment of sepsis. Resolved.   Hyperlipidemia Continue Lipitor   Anemia of chronic disease Chronic. Stable.   First degree AV block Bradycardia Cardiology consulted for rates as low as 30 bpm. Transthoracic Echocardiogram with normal LVEF of 50-55%. Cardiology recommending no intervention, including no pacemaker. This plan was discussed with family. Recommendation for hospice which family are considering. Can be readdressed as an outpatient.   Hyperkalemia Persistent issue. Asymptomatic. Patient started on [facility] however she has declined administration many times. Lasix given with mild improvement of potassium. Lokelma resumed with subsequent improvement of potassium. Discharge with Lokelma x3 days with recommendations to recheck BMP.   Thrombocytopenia In setting of sepsis. Improving.   Primary hypertension Noted. Not on antihypertensive medications. Stable.   AKI on CKD stage IV Baseline creatinine of about 1.9-2.1. Creatinine of 2.40 on admission. Improved back to baseline with fluids.   Diabetes mellitus, type 2 Hemoglobin A1C of 5.7%. Carb modified diet.   Consultants: Psychiatry, Cardiology Procedures performed: Transthoracic Echocardiogram (5/27)  Disposition: Skilled nursing facility Diet recommendation: Dysphagia 3, thin liquid  DISCHARGE MEDICATION: Allergies as of 04/18/2022       Reactions   Alendronate    Other reaction(s): Other (See Comments) Poor renal function.        Medication List     STOP taking these medications    diphenhydrAMINE 50 MG/ML injection Commonly known as: BENADRYL   LORazepam 2 MG/ML injection Commonly known as: ATIVAN   PRESCRIPTION MEDICATION       TAKE these medications    acetaminophen 325 MG tablet Commonly known as: TYLENOL Take 2 tablets (650 mg total) by mouth every 6 (six) hours as needed for mild pain (or Fever >/= 101).   atorvastatin 20 MG  tablet Commonly known  as: LIPITOR Take 20 mg by mouth daily.   divalproex 125 MG capsule Commonly known as: DEPAKOTE SPRINKLE Take 250 mg by mouth 3 (three) times daily.   loperamide 2 MG capsule Commonly known as: IMODIUM Take 1 capsule (2 mg total) by mouth as needed for diarrhea or loose stools (upto 3 times a day).   NUTRITIONAL SUPPLEMENT PO Take 120 mLs by mouth 3 (three) times daily.   pantoprazole 40 MG tablet Commonly known as: PROTONIX Take 40 mg by mouth daily.   polyethylene glycol 17 g packet Commonly known as: MIRALAX / GLYCOLAX Take 17 g by mouth daily as needed for moderate constipation.   QUEtiapine 25 MG tablet Commonly known as: SEROQUEL Take 25 mg by mouth at bedtime.   sodium zirconium cyclosilicate 10 g Pack packet Commonly known as: LOKELMA Take 10 g by mouth daily for 3 days. Start taking on: April 19, 2022   Vitamin D (Ergocalciferol) 1.25 MG (50000 UNIT) Caps capsule Commonly known as: DRISDOL Take 50,000 Units by mouth every Friday.        Contact information for after-discharge care     [facility] .   Service: Skilled Nursing Contact information: [place] [phone]                    Discharge Exam: BP 124/66 (BP Location: Left Arm)   Pulse 88   Temp 98.8 F (37.1 C) (Axillary)   Resp 16   Ht 6' (1.829 m)   Wt 94.7 kg   SpO2 97%   BMI 28.32 kg/m   General exam: Appears calm and comfortable. Pleasant. Respiratory system: Clear to auscultation. Respiratory effort normal. Cardiovascular system: S1 & S2 heard, RRR. No murmurs, rubs, gallops or clicks. Gastrointestinal system: Abdomen is nondistended, soft and nontender. Normal bowel sounds heard. Central nervous system: Alert. No focal neurological deficits. Musculoskeletal: No edema. No calf tenderness  Condition at discharge: stable  The results of significant diagnostics from this hospitalization (including  imaging, microbiology, ancillary and laboratory) are listed below for reference.   Imaging Studies: ECHOCARDIOGRAM COMPLETE  Result Date: 04/05/2022    ECHOCARDIOGRAM REPORT   Patient Name:   ABILENE MCPHEE Date of Exam: 04/05/2022 Medical Rec #:  [id number]  Height:       68.0 in Accession #:    [id number] Weight:       207.2 lb Date of Birth:  June 24, 1930   BSA:          2.075 m Patient Age:    23 years   BP:           98/48 mmHg Patient Gender: F          HR:           103 bpm. Exam Location:  Inpatient Procedure: 2D Echo, Color Doppler, Cardiac Doppler and Intracardiac            Opacification Agent Indications:    Shock  History:        Patient has prior history of Echocardiogram examinations, most                 recent 06/20/2021. Risk Factors:Dyslipidemia, Diabetes and                 Hypertension.  Sonographer:    Kamiah Referring Phys: [id number] Estill Cotta  Sonographer Comments: Technically difficult study due  to poor echo windows, echo performed with patient supine and on artificial respirator and patient is morbidly obese. IMPRESSIONS  1. Left ventricular ejection fraction, by estimation, is >75%. The left ventricle has hyperdynamic function. The left ventricle has no regional wall motion abnormalities. There is severe eccentric left ventricular hypertrophy of the septal segment. Left  ventricular diastolic parameters are indeterminate.  2. Right ventricular systolic function is normal. The right ventricular size is normal.  3. The mitral valve is grossly normal. Trivial mitral valve regurgitation. No evidence of mitral stenosis.  4. The aortic valve is tricuspid. There is moderate calcification of the aortic valve. There is mild thickening of the aortic valve. Aortic valve regurgitation is mild. Aortic valve sclerosis/calcification is present, without any evidence of aortic stenosis. Comparison(s): Changes from prior study are noted. LV now hyperdynamic. Conclusion(s)/Recommendation(s):  Otherwise normal echocardiogram, with minor abnormalities described in the report. FINDINGS  Left Ventricle: Left ventricular ejection fraction, by estimation, is >75%. The left ventricle has hyperdynamic function. The left ventricle has no regional wall motion abnormalities. Definity contrast agent was given IV to delineate the left ventricular endocardial borders. The left ventricular internal cavity size was small. There is severe eccentric left ventricular hypertrophy of the septal segment. Left ventricular diastolic parameters are indeterminate. Right Ventricle: The right ventricular size is normal. Right vetricular wall thickness was not well visualized. Right ventricular systolic function is normal. Left Atrium: Left atrial size was normal in size. Right Atrium: Right atrial size was normal in size. Pericardium: Trivial pericardial effusion is present. Mitral Valve: The mitral valve is grossly normal. Trivial mitral valve regurgitation. No evidence of mitral valve stenosis. MV peak gradient, 12.4 mmHg. The mean mitral valve gradient is 5.0 mmHg. Tricuspid Valve: The tricuspid valve is grossly normal. Tricuspid valve regurgitation is trivial. No evidence of tricuspid stenosis. Aortic Valve: The aortic valve is tricuspid. There is moderate calcification of the aortic valve. There is mild thickening of the aortic valve. Aortic valve regurgitation is mild. Aortic regurgitation PHT measures 361 msec. Aortic valve sclerosis/calcification is present, without any evidence of aortic stenosis. Aortic valve mean gradient measures 8.0 mmHg. Aortic valve peak gradient measures 14.1 mmHg. Aortic valve area, by VTI measures 1.98 cm. Pulmonic Valve: The pulmonic valve was not well visualized. Pulmonic valve regurgitation is not visualized. No evidence of pulmonic stenosis. Aorta: The aortic root, ascending aorta, aortic arch and descending aorta are all structurally normal, with no evidence of dilitation or obstruction.  Venous: IVC assessment for right atrial pressure unable to be performed due to mechanical ventilation. IAS/Shunts: The interatrial septum was not well visualized.  LEFT VENTRICLE PLAX 2D LVIDd:         2.98 cm   Diastology LVIDs:         1.59 cm   LV e' medial:    12.00 cm/s LV PW:         1.10 cm   LV E/e' medial:  12.4 LV IVS:        1.50 cm   LV e' lateral:   6.29 cm/s LVOT diam:     1.70 cm   LV E/e' lateral: 23.7 LV SV:         53 LV SV Index:   26 LVOT Area:     2.27 cm  RIGHT VENTRICLE RV Basal diam:  3.80 cm RV Mid diam:    2.10 cm RV S prime:     14.10 cm/s TAPSE (M-mode): 2.8 cm LEFT ATRIUM  Index        RIGHT ATRIUM           Index LA diam:      2.40 cm 1.16 cm/m   RA Area:     17.00 cm LA Vol (A2C): 34.9 ml 16.82 ml/m  RA Volume:   52.80 ml  25.45 ml/m LA Vol (A4C): 41.6 ml 20.05 ml/m  AORTIC VALVE                     PULMONIC VALVE AV Area (Vmax):    2.02 cm      PV Vmax:       1.18 m/s AV Area (Vmean):   1.89 cm      PV Vmean:      91.700 cm/s AV Area (VTI):     1.98 cm      PV VTI:        0.204 m AV Vmax:           188.00 cm/s   PV Peak grad:  5.6 mmHg AV Vmean:          130.000 cm/s  PV Mean grad:  4.0 mmHg AV VTI:            0.268 m AV Peak Grad:      14.1 mmHg AV Mean Grad:      8.0 mmHg LVOT Vmax:         167.00 cm/s LVOT Vmean:        108.000 cm/s LVOT VTI:          0.234 m LVOT/AV VTI ratio: 0.87 AI PHT:            361 msec  AORTA Ao Root diam: 3.30 cm Ao Asc diam:  3.60 cm MITRAL VALVE MV Area VTI:  1.97 cm      SHUNTS MV Peak grad: 12.4 mmHg     Systemic VTI:  0.23 m MV Mean grad: 5.0 mmHg      Systemic Diam: 1.70 cm MV Vmax:      1.76 m/s MV Vmean:     97.3 cm/s MV E velocity: 149.00 cm/s Buford Dresser MD Electronically signed by Buford Dresser MD Signature Date/Time: 04/05/2022/1:21:36 PM    Final    CT HEAD WO CONTRAST (5MM)  Result Date: 04/05/2022 CLINICAL DATA:  Mental status change with unknown cause. EXAM: CT HEAD WITHOUT CONTRAST TECHNIQUE:  Contiguous axial images were obtained from the base of the skull through the vertex without intravenous contrast. RADIATION DOSE REDUCTION: This exam was performed according to the departmental dose-optimization program which includes automated exposure control, adjustment of the mA and/or kV according to patient size and/or use of iterative reconstruction technique. COMPARISON:  10/13/2021 FINDINGS: Brain: No evidence of acute infarction, hemorrhage, hydrocephalus, extra-axial collection or mass lesion/mass effect. Chronic small vessel ischemic gliosis in the cerebral white matter. Chronic lacunar infarct at the left corona radiata/basal ganglia. Generalized cerebral volume loss. Vascular: No hyperdense vessel or unexpected calcification. Skull: Normal. Negative for fracture or focal lesion. Sinuses/Orbits: Bilateral cataract resection and left scleral banding. No acute finding. IMPRESSION: Stable compared to 2022.  No acute finding. Electronically Signed   By: Jorje Guild M.D.   On: 04/05/2022 08:47   DG Chest Port 1 View  Result Date: 04/04/2022 CLINICAL DATA:  Altered mental status with weakness. EXAM: PORTABLE CHEST 1 VIEW COMPARISON:  Chest x-ray 10/13/2021 FINDINGS: The heart is enlarged. There central pulmonary vascular congestion. There is no pleural effusion or  pneumothorax. No acute fractures are seen. IMPRESSION: 1. Cardiomegaly with central pulmonary vascular congestion. Electronically Signed   By: Ronney Asters M.D.   On: 04/04/2022 23:50    Microbiology: Results for orders placed or performed during the hospital encounter of 04/04/22  Culture, blood (routine x 2)     Status: None   Collection Time: 04/04/22 12:15 AM   Specimen: BLOOD RIGHT HAND  Result Value Ref Range Status   Specimen Description BLOOD RIGHT HAND  Final   Special Requests   Final    BOTTLES DRAWN AEROBIC AND ANAEROBIC Blood Culture results may not be optimal due to an inadequate volume of blood received in culture  bottles   Culture   Final    NO GROWTH 5 DAYS Performed at [facility], [place] [address], [place], [place] [address]    Report Status 04/10/2022 FINAL  Final  Urine Culture     Status: Abnormal   Collection Time: 04/04/22 11:54 PM   Specimen: In/Out Cath Urine  Result Value Ref Range Status   Specimen Description IN/OUT CATH URINE  Final   Special Requests   Final    NONE Performed at [facility], [place] [address], [place], [place] [address]    Culture >=100,000 COLONIES/mL CITROBACTER FREUNDII (A)  Final   Report Status 04/07/2022 FINAL  Final   Organism ID, Bacteria CITROBACTER FREUNDII (A)  Final      Susceptibility   Citrobacter freundii - MIC*    CEFAZOLIN >=64 RESISTANT Resistant     CEFEPIME <=0.12 SENSITIVE Sensitive     CEFTRIAXONE <=0.25 SENSITIVE Sensitive     CIPROFLOXACIN <=0.25 SENSITIVE Sensitive     GENTAMICIN <=1 SENSITIVE Sensitive     IMIPENEM 0.5 SENSITIVE Sensitive     NITROFURANTOIN <=16 SENSITIVE Sensitive     TRIMETH/SULFA <=20 SENSITIVE Sensitive     PIP/TAZO <=4 SENSITIVE Sensitive     * >=100,000 COLONIES/mL CITROBACTER FREUNDII  Culture, blood (routine x 2)     Status: None   Collection Time: 04/05/22 12:37 AM   Specimen: BLOOD RIGHT ARM  Result Value Ref Range Status   Specimen Description BLOOD RIGHT ARM  Final   Special Requests   Final    BOTTLES DRAWN AEROBIC AND ANAEROBIC Blood Culture adequate volume   Culture   Final    NO GROWTH 5 DAYS Performed at [facility], [address] [place], [place] [address]    Report Status 04/10/2022 FINAL  Final  MRSA Next Gen by PCR, Nasal     Status: None   Collection Time: 04/05/22  9:00 AM   Specimen: Nasal Mucosa; Nasal Swab  Result Value Ref Range Status   MRSA by PCR Next Gen NOT DETECTED NOT DETECTED Final    Comment: (NOTE) The GeneXpert MRSA Assay (FDA approved for NASAL specimens only), is one component of a comprehensive MRSA colonization surveillance program. It is not  intended to diagnose MRSA infection nor to guide or monitor treatment for MRSA infections. Test performance is not FDA approved in patients less than 71 years old. Performed at [facility], [place] [address], [place], [place] [address]     Labs:  Basic Metabolic Panel: Recent Labs  Lab 04/12/22 [phone] 04/12/22 4401 04/12/22 1432 04/12/22 1920 04/13/22 0308 04/14/22 0533 04/15/22 0272 04/16/22 0553 04/17/22 0548 04/17/22 1131 04/17/22 1530 04/17/22 1631 04/18/22 0751  NA 137  --   --   --  135 139 140 141  --   --   --   --   --  K 6.3*   < > 5.7*   < > 5.1  5.2* 5.5* 4.7 5.3* 5.7* 5.6* 5.6* 5.6* 5.3*  CL 105  --   --   --  102 109 109 109  --   --   --   --   --   CO2 26  --   --   --  27 25 26 23   --   --   --   --   --   GLUCOSE 88  --  71  --  136* 98 102* 101*  --   --   --   --   --   BUN 35*  --   --   --  39* 38* 38* 33*  --   --   --   --   --   CREATININE 1.79*  --   --   --  2.00* 2.12* 1.93* 1.55*  --   --   --   --   --   CALCIUM 9.5  --   --   --  9.1 9.0 9.1 9.4  --   --   --   --   --   PHOS 4.1  --   --   --   --   --   --   --   --   --   --   --   --    < > = values in this interval not displayed.   Liver Function Tests: Recent Labs  Lab 04/12/22 0219  ALBUMIN 2.9*   CBG: Recent Labs  Lab 04/17/22 1946 04/18/22 0035 04/18/22 0515 04/18/22 0530 04/18/22 0750  GLUCAP 111* 119* 81 80 92    Discharge time spent: 35 minutes.  Signed: Cordelia Poche, MD [facility] 04/18/2022

## 2022-05-12 ENCOUNTER — Other Ambulatory Visit: Payer: Self-pay

## 2022-05-12 ENCOUNTER — Emergency Department (EMERGENCY_DEPARTMENT_HOSPITAL)
Admission: EM | Admit: 2022-05-12 | Discharge: 2022-05-13 | Disposition: A | Discharge status: Home / Self Care | Payer: Medicare Other | Source: Home / Self Care | Attending: Emergency Medicine | Admitting: Emergency Medicine

## 2022-05-12 DIAGNOSIS — R296 Repeated falls: Secondary | ICD-10-CM | POA: Insufficient documentation

## 2022-05-12 DIAGNOSIS — R627 Adult failure to thrive: Secondary | ICD-10-CM | POA: Diagnosis not present

## 2022-05-12 DIAGNOSIS — N170 Acute kidney failure with tubular necrosis: Secondary | ICD-10-CM | POA: Diagnosis not present

## 2022-05-12 DIAGNOSIS — F0394 Unspecified dementia, unspecified severity, with anxiety: Secondary | ICD-10-CM

## 2022-05-12 DIAGNOSIS — R4182 Altered mental status, unspecified: Secondary | ICD-10-CM | POA: Diagnosis present

## 2022-05-12 DIAGNOSIS — R451 Restlessness and agitation: Secondary | ICD-10-CM | POA: Diagnosis present

## 2022-05-12 DIAGNOSIS — Z20822 Contact with and (suspected) exposure to covid-19: Secondary | ICD-10-CM | POA: Insufficient documentation

## 2022-05-12 DIAGNOSIS — F03918 Unspecified dementia, unspecified severity, with other behavioral disturbance: Secondary | ICD-10-CM | POA: Insufficient documentation

## 2022-05-12 DIAGNOSIS — R531 Weakness: Secondary | ICD-10-CM

## 2022-05-12 DIAGNOSIS — F322 Major depressive disorder, single episode, severe without psychotic features: Secondary | ICD-10-CM | POA: Diagnosis present

## 2022-05-12 DIAGNOSIS — E119 Type 2 diabetes mellitus without complications: Secondary | ICD-10-CM | POA: Insufficient documentation

## 2022-05-12 DIAGNOSIS — W19XXXA Unspecified fall, initial encounter: Secondary | ICD-10-CM | POA: Diagnosis present

## 2022-05-12 DIAGNOSIS — F323 Major depressive disorder, single episode, severe with psychotic features: Secondary | ICD-10-CM | POA: Diagnosis present

## 2022-05-12 DIAGNOSIS — I10 Essential (primary) hypertension: Secondary | ICD-10-CM | POA: Insufficient documentation

## 2022-05-12 LAB — CBC WITH DIFFERENTIAL/PLATELET
Abs Immature Granulocytes: 0.05 10*3/uL (ref 0.00–0.07)
Basophils Absolute: 0 10*3/uL (ref 0.0–0.1)
Basophils Relative: 0 %
Eosinophils Absolute: 0.1 10*3/uL (ref 0.0–0.5)
Eosinophils Relative: 2 %
HCT: 35.7 % — ABNORMAL LOW (ref 36.0–46.0)
Hemoglobin: 10.7 g/dL — ABNORMAL LOW (ref 12.0–15.0)
Immature Granulocytes: 1 %
Lymphocytes Relative: 27 %
Lymphs Abs: 1.9 10*3/uL (ref 0.7–4.0)
MCH: 30.1 pg (ref 26.0–34.0)
MCHC: 30 g/dL (ref 30.0–36.0)
MCV: 100.6 fL — ABNORMAL HIGH (ref 80.0–100.0)
Monocytes Absolute: 0.8 10*3/uL (ref 0.1–1.0)
Monocytes Relative: 12 %
Neutro Abs: 3.9 10*3/uL (ref 1.7–7.7)
Neutrophils Relative %: 58 %
Platelets: 151 10*3/uL (ref 150–400)
RBC: 3.55 MIL/uL — ABNORMAL LOW (ref 3.87–5.11)
RDW: 14.4 % (ref 11.5–15.5)
Smear Review: NORMAL
WBC: 6.8 10*3/uL (ref 4.0–10.5)
nRBC: 0 % (ref 0.0–0.2)

## 2022-05-12 LAB — BASIC METABOLIC PANEL
Anion gap: 8 (ref 5–15)
BUN: 26 mg/dL — ABNORMAL HIGH (ref 8–23)
CO2: 23 mmol/L (ref 22–32)
Calcium: 9.4 mg/dL (ref 8.9–10.3)
Chloride: 111 mmol/L (ref 98–111)
Creatinine, Ser: 1.78 mg/dL — ABNORMAL HIGH (ref 0.44–1.00)
GFR, Estimated: 27 mL/min — ABNORMAL LOW (ref >60)
Glucose, Bld: 106 mg/dL — ABNORMAL HIGH (ref 70–99)
Potassium: 4.1 mmol/L (ref 3.5–5.1)
Sodium: 142 mmol/L (ref 135–145)

## 2022-05-12 LAB — VALPROIC ACID LEVEL: Valproic Acid Lvl: <10 ug/mL — ABNORMAL LOW (ref 50.0–100.0)

## 2022-05-12 MED ORDER — ACETAMINOPHEN 325 MG PO TABS: 650.0000 mg | ORAL_TABLET | Freq: Four times a day (QID) | ORAL | Status: DC | PRN

## 2022-05-12 MED ORDER — VITAMIN D (ERGOCALCIFEROL) 1.25 MG (50000 UNIT) PO CAPS: 50000.0000 [IU] | ORAL_CAPSULE | ORAL | Status: DC

## 2022-05-12 MED ORDER — LORAZEPAM 2 MG/ML IJ SOLN: 2.0000 mg | Freq: Two times a day (BID) | INTRAMUSCULAR | Status: DC | PRN

## 2022-05-12 MED ORDER — PANTOPRAZOLE SODIUM 40 MG PO TBEC
40.0000 mg | DELAYED_RELEASE_TABLET | Freq: Every day | ORAL | Status: DC
  Filled 2022-05-12: qty 1

## 2022-05-12 MED ORDER — DIVALPROEX SODIUM 125 MG PO CSDR
250.0000 mg | DELAYED_RELEASE_CAPSULE | Freq: Three times a day (TID) | ORAL | Status: DC
  Administered 2022-05-12 – 2022-05-13 (×2): 250 mg via ORAL
  Filled 2022-05-12 (×3): qty 2

## 2022-05-12 MED ORDER — POLYETHYLENE GLYCOL 3350 17 G PO PACK: 17.0000 g | PACK | Freq: Every day | ORAL | Status: DC | PRN

## 2022-05-12 MED ORDER — ATORVASTATIN CALCIUM 20 MG PO TABS
20.0000 mg | ORAL_TABLET | Freq: Every day | ORAL | Status: DC
  Filled 2022-05-12: qty 1

## 2022-05-12 MED ORDER — NUTRITIONAL SUPPLEMENT PO LIQD: Freq: Three times a day (TID) | ORAL | Status: DC

## 2022-05-12 MED ORDER — LOPERAMIDE HCL 2 MG PO CAPS: 2.0000 mg | ORAL_CAPSULE | ORAL | Status: DC | PRN

## 2022-05-12 MED ORDER — QUETIAPINE FUMARATE 25 MG PO TABS
25.0000 mg | ORAL_TABLET | Freq: Every day | ORAL | Status: DC
  Administered 2022-05-12: 25 mg via ORAL
  Filled 2022-05-12: qty 1
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
## 2022-05-12 NOTE — ED Notes (Signed)
Patient moved to [facility]. Patient requesting something to eat and drink. She states she is starving. Given sandwich tray and apple juice.
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
## 2022-05-13 DIAGNOSIS — F0394 Unspecified dementia, unspecified severity, with anxiety: Secondary | ICD-10-CM | POA: Diagnosis not present

## 2022-05-13 LAB — RESP PANEL BY RT-PCR (FLU A&B, COVID) ARPGX2
Influenza A by PCR: NEGATIVE
Influenza B by PCR: NEGATIVE
SARS Coronavirus 2 by RT PCR: NEGATIVE

## 2022-05-13 MED ORDER — LORAZEPAM 1 MG PO TABS
1.0000 mg | ORAL_TABLET | Freq: Once | ORAL | Status: AC
  Administered 2022-05-13: 1 mg via ORAL
  Filled 2022-05-13: qty 1

## 2022-05-13 MED ORDER — OLANZAPINE 5 MG PO TBDP
10.0000 mg | ORAL_TABLET | ORAL | Status: AC
  Administered 2022-05-13: 10 mg via ORAL
  Filled 2022-05-13: qty 2

## 2022-05-13 NOTE — ED Notes (Signed)
Report received from Ara Kussmaul, Conservation officer, nature. On initial round after report Pt is warm/dry, resting quietly in room without any s/s of distress.  Will continue to monitor throughout shift as ordered for any changes in behaviors and for continued safety.

## 2022-05-13 NOTE — ED Notes (Signed)
Sitting on side of bed, argumentative. States that she needs to go home. Pt refusing to listen to staff. With 2 RN assistance pt placed back in bed safely due to risk for fall and inability to follow directions.

## 2022-05-13 NOTE — ED Notes (Signed)
[facility]  called  for transport  home °

## 2022-05-13 NOTE — ED Notes (Signed)
Pt is talking, singing and praising the lord.

## 2022-05-13 NOTE — ED Notes (Signed)
pt left via EMS to home with daughter.  All pt belongings returned and accounted for.  Pt daughter notified via phone while EMS present

## 2022-05-13 NOTE — ED Notes (Signed)
Pt stated she was asleep during snack time. Pt given crackers and juice. Pt consumed 80%

## 2022-05-13 NOTE — ED Provider Notes (Signed)
-----------------------------------------   7:58 AM on 05/13/2022 -----------------------------------------   Blood pressure 140/82, pulse (!) 56, temperature 98.1 F (36.7 C), temperature source Oral, resp. rate 18, SpO2 100 %.  The patient is calm and cooperative at this time.  However, she became agitated last night and was resistant to redirection.  Fortunately she voluntarily took olanzapine 10 mg ODT, which served as an adequate calming agent.  Awaiting disposition plan from [facility] team.   Hinda Kehr, MD 05/13/22 [phone]

## 2022-05-13 NOTE — ED Notes (Signed)
Pt resting comfortably with no signs of acute distress.
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
## 2022-05-13 NOTE — ED Notes (Signed)
During q15 checks pt is resting comfortable with no signs of acute distress.

## 2022-05-13 NOTE — ED Notes (Signed)
Hospital meal provided, pt tolerated w/o complaints.  Waste discarded appropriately.  

## 2022-05-13 NOTE — ED Notes (Signed)
Pt assisted x2 staff to restroom, is unable to walk alone.  Pt stated she cannot lay in bed it "hurts my back" moved to recliner. Hospital meal provided, pt tolerated w/o complaints.  Waste discarded appropriately.

## 2022-05-13 NOTE — ED Notes (Signed)
Spoke to pt'd daughter Jolayne Haines @ [phone].  Daughter stated she was arranging transportation and would call to let us know what time to expect pt to be picked up.

## 2022-05-14 ENCOUNTER — Inpatient Hospital Stay
Admission: EM | Admit: 2022-05-14 | Discharge: 2022-05-22 | DRG: 683 | Disposition: A | Discharge status: Skilled Nursing Facility | Payer: Medicare Other | Attending: Internal Medicine | Admitting: Internal Medicine

## 2022-05-14 ENCOUNTER — Encounter: Payer: Self-pay | Admitting: Emergency Medicine

## 2022-05-14 DIAGNOSIS — F0394 Unspecified dementia, unspecified severity, with anxiety: Secondary | ICD-10-CM | POA: Diagnosis present

## 2022-05-14 DIAGNOSIS — F323 Major depressive disorder, single episode, severe with psychotic features: Secondary | ICD-10-CM | POA: Diagnosis present

## 2022-05-14 DIAGNOSIS — Z66 Do not resuscitate: Secondary | ICD-10-CM | POA: Diagnosis present

## 2022-05-14 DIAGNOSIS — F0392 Unspecified dementia, unspecified severity, with psychotic disturbance: Secondary | ICD-10-CM | POA: Diagnosis present

## 2022-05-14 DIAGNOSIS — E1122 Type 2 diabetes mellitus with diabetic chronic kidney disease: Secondary | ICD-10-CM | POA: Diagnosis present

## 2022-05-14 DIAGNOSIS — Z789 Other specified health status: Secondary | ICD-10-CM | POA: Diagnosis present

## 2022-05-14 DIAGNOSIS — E119 Type 2 diabetes mellitus without complications: Secondary | ICD-10-CM

## 2022-05-14 DIAGNOSIS — N390 Urinary tract infection, site not specified: Secondary | ICD-10-CM | POA: Diagnosis present

## 2022-05-14 DIAGNOSIS — Z79899 Other long term (current) drug therapy: Secondary | ICD-10-CM

## 2022-05-14 DIAGNOSIS — N185 Chronic kidney disease, stage 5: Secondary | ICD-10-CM | POA: Diagnosis present

## 2022-05-14 DIAGNOSIS — N170 Acute kidney failure with tubular necrosis: Principal | ICD-10-CM | POA: Diagnosis present

## 2022-05-14 DIAGNOSIS — I5032 Chronic diastolic (congestive) heart failure: Secondary | ICD-10-CM | POA: Diagnosis present

## 2022-05-14 DIAGNOSIS — E875 Hyperkalemia: Secondary | ICD-10-CM | POA: Diagnosis present

## 2022-05-14 DIAGNOSIS — D631 Anemia in chronic kidney disease: Secondary | ICD-10-CM | POA: Diagnosis present

## 2022-05-14 DIAGNOSIS — N2581 Secondary hyperparathyroidism of renal origin: Secondary | ICD-10-CM | POA: Diagnosis present

## 2022-05-14 DIAGNOSIS — Z888 Allergy status to other drugs, medicaments and biological substances status: Secondary | ICD-10-CM

## 2022-05-14 DIAGNOSIS — Z20822 Contact with and (suspected) exposure to covid-19: Secondary | ICD-10-CM | POA: Diagnosis present

## 2022-05-14 DIAGNOSIS — S40012A Contusion of left shoulder, initial encounter: Secondary | ICD-10-CM | POA: Diagnosis present

## 2022-05-14 DIAGNOSIS — N179 Acute kidney failure, unspecified: Secondary | ICD-10-CM | POA: Diagnosis present

## 2022-05-14 DIAGNOSIS — I132 Hypertensive heart and chronic kidney disease with heart failure and with stage 5 chronic kidney disease, or end stage renal disease: Secondary | ICD-10-CM | POA: Diagnosis present

## 2022-05-14 DIAGNOSIS — E8889 Other specified metabolic disorders: Secondary | ICD-10-CM | POA: Diagnosis present

## 2022-05-14 DIAGNOSIS — Z6830 Body mass index (BMI) 30.0-30.9, adult: Secondary | ICD-10-CM

## 2022-05-14 DIAGNOSIS — Z9181 History of falling: Secondary | ICD-10-CM

## 2022-05-14 DIAGNOSIS — F0393 Unspecified dementia, unspecified severity, with mood disturbance: Secondary | ICD-10-CM | POA: Diagnosis present

## 2022-05-14 DIAGNOSIS — W19XXXA Unspecified fall, initial encounter: Secondary | ICD-10-CM | POA: Diagnosis present

## 2022-05-14 DIAGNOSIS — T7601XA Adult neglect or abandonment, suspected, initial encounter: Secondary | ICD-10-CM

## 2022-05-14 DIAGNOSIS — W06XXXA Fall from bed, initial encounter: Secondary | ICD-10-CM | POA: Diagnosis present

## 2022-05-14 DIAGNOSIS — Z9049 Acquired absence of other specified parts of digestive tract: Secondary | ICD-10-CM

## 2022-05-14 DIAGNOSIS — R531 Weakness: Principal | ICD-10-CM

## 2022-05-14 DIAGNOSIS — Y92003 Bedroom of unspecified non-institutional (private) residence as the place of occurrence of the external cause: Secondary | ICD-10-CM

## 2022-05-14 DIAGNOSIS — E785 Hyperlipidemia, unspecified: Secondary | ICD-10-CM | POA: Diagnosis present

## 2022-05-14 DIAGNOSIS — R001 Bradycardia, unspecified: Secondary | ICD-10-CM

## 2022-05-14 DIAGNOSIS — F03918 Unspecified dementia, unspecified severity, with other behavioral disturbance: Secondary | ICD-10-CM | POA: Diagnosis present

## 2022-05-14 DIAGNOSIS — R627 Adult failure to thrive: Secondary | ICD-10-CM | POA: Diagnosis present

## 2022-05-14 LAB — CBC WITH DIFFERENTIAL/PLATELET
Abs Immature Granulocytes: 0.06 10*3/uL (ref 0.00–0.07)
Basophils Absolute: 0 10*3/uL (ref 0.0–0.1)
Basophils Relative: 1 %
Eosinophils Absolute: 0.2 10*3/uL (ref 0.0–0.5)
Eosinophils Relative: 3 %
HCT: 38.4 % (ref 36.0–46.0)
Hemoglobin: 11.7 g/dL — ABNORMAL LOW (ref 12.0–15.0)
Immature Granulocytes: 1 %
Lymphocytes Relative: 23 %
Lymphs Abs: 1.9 10*3/uL (ref 0.7–4.0)
MCH: 30.2 pg (ref 26.0–34.0)
MCHC: 30.5 g/dL (ref 30.0–36.0)
MCV: 99.2 fL (ref 80.0–100.0)
Monocytes Absolute: 0.8 10*3/uL (ref 0.1–1.0)
Monocytes Relative: 9 %
Neutro Abs: 5.3 10*3/uL (ref 1.7–7.7)
Neutrophils Relative %: 63 %
Platelets: UNDETERMINED 10*3/uL (ref 150–400)
RBC: 3.87 MIL/uL (ref 3.87–5.11)
RDW: 14.6 % (ref 11.5–15.5)
Smear Review: UNDETERMINED
WBC: 8.2 10*3/uL (ref 4.0–10.5)
nRBC: 0 % (ref 0.0–0.2)

## 2022-05-14 LAB — COMPREHENSIVE METABOLIC PANEL
ALT: 13 U/L (ref 0–44)
AST: 16 U/L (ref 15–41)
Albumin: 3.5 g/dL (ref 3.5–5.0)
Alkaline Phosphatase: 63 U/L (ref 38–126)
Anion gap: 8 (ref 5–15)
BUN: 30 mg/dL — ABNORMAL HIGH (ref 8–23)
CO2: 24 mmol/L (ref 22–32)
Calcium: 9.4 mg/dL (ref 8.9–10.3)
Chloride: 109 mmol/L (ref 98–111)
Creatinine, Ser: 1.69 mg/dL — ABNORMAL HIGH (ref 0.44–1.00)
GFR, Estimated: 29 mL/min — ABNORMAL LOW (ref >60)
Glucose, Bld: 111 mg/dL — ABNORMAL HIGH (ref 70–99)
Potassium: 4.7 mmol/L (ref 3.5–5.1)
Sodium: 141 mmol/L (ref 135–145)
Total Bilirubin: 0.7 mg/dL (ref 0.3–1.2)
Total Protein: 8 g/dL (ref 6.5–8.1)
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
## 2022-05-14 NOTE — ED Triage Notes (Signed)
EMS brings pt from home for FTT; to triage via w/c with no distress noted

## 2022-05-15 ENCOUNTER — Emergency Department: Payer: Medicare Other

## 2022-05-15 ENCOUNTER — Other Ambulatory Visit: Payer: Self-pay

## 2022-05-15 DIAGNOSIS — Z20822 Contact with and (suspected) exposure to covid-19: Secondary | ICD-10-CM | POA: Diagnosis present

## 2022-05-15 DIAGNOSIS — Z66 Do not resuscitate: Secondary | ICD-10-CM | POA: Diagnosis present

## 2022-05-15 DIAGNOSIS — E875 Hyperkalemia: Secondary | ICD-10-CM | POA: Diagnosis present

## 2022-05-15 DIAGNOSIS — Z6830 Body mass index (BMI) 30.0-30.9, adult: Secondary | ICD-10-CM | POA: Diagnosis not present

## 2022-05-15 DIAGNOSIS — D631 Anemia in chronic kidney disease: Secondary | ICD-10-CM | POA: Diagnosis present

## 2022-05-15 DIAGNOSIS — I5032 Chronic diastolic (congestive) heart failure: Secondary | ICD-10-CM | POA: Diagnosis present

## 2022-05-15 DIAGNOSIS — F323 Major depressive disorder, single episode, severe with psychotic features: Secondary | ICD-10-CM | POA: Diagnosis present

## 2022-05-15 DIAGNOSIS — E1122 Type 2 diabetes mellitus with diabetic chronic kidney disease: Secondary | ICD-10-CM

## 2022-05-15 DIAGNOSIS — N179 Acute kidney failure, unspecified: Secondary | ICD-10-CM | POA: Diagnosis not present

## 2022-05-15 DIAGNOSIS — F0284 Dementia in other diseases classified elsewhere, unspecified severity, with anxiety: Secondary | ICD-10-CM | POA: Diagnosis not present

## 2022-05-15 DIAGNOSIS — N185 Chronic kidney disease, stage 5: Secondary | ICD-10-CM | POA: Diagnosis present

## 2022-05-15 DIAGNOSIS — N2581 Secondary hyperparathyroidism of renal origin: Secondary | ICD-10-CM | POA: Diagnosis present

## 2022-05-15 DIAGNOSIS — E8889 Other specified metabolic disorders: Secondary | ICD-10-CM | POA: Diagnosis present

## 2022-05-15 DIAGNOSIS — R627 Adult failure to thrive: Secondary | ICD-10-CM | POA: Diagnosis present

## 2022-05-15 DIAGNOSIS — W19XXXA Unspecified fall, initial encounter: Secondary | ICD-10-CM

## 2022-05-15 DIAGNOSIS — E119 Type 2 diabetes mellitus without complications: Secondary | ICD-10-CM | POA: Diagnosis not present

## 2022-05-15 DIAGNOSIS — Y92003 Bedroom of unspecified non-institutional (private) residence as the place of occurrence of the external cause: Secondary | ICD-10-CM | POA: Diagnosis not present

## 2022-05-15 DIAGNOSIS — F0392 Unspecified dementia, unspecified severity, with psychotic disturbance: Secondary | ICD-10-CM | POA: Diagnosis present

## 2022-05-15 DIAGNOSIS — E785 Hyperlipidemia, unspecified: Secondary | ICD-10-CM | POA: Diagnosis present

## 2022-05-15 DIAGNOSIS — S40012A Contusion of left shoulder, initial encounter: Secondary | ICD-10-CM | POA: Diagnosis present

## 2022-05-15 DIAGNOSIS — Z789 Other specified health status: Secondary | ICD-10-CM | POA: Diagnosis present

## 2022-05-15 DIAGNOSIS — Z9181 History of falling: Secondary | ICD-10-CM | POA: Diagnosis not present

## 2022-05-15 DIAGNOSIS — F0393 Unspecified dementia, unspecified severity, with mood disturbance: Secondary | ICD-10-CM | POA: Diagnosis present

## 2022-05-15 DIAGNOSIS — N390 Urinary tract infection, site not specified: Secondary | ICD-10-CM | POA: Diagnosis present

## 2022-05-15 DIAGNOSIS — W06XXXA Fall from bed, initial encounter: Secondary | ICD-10-CM | POA: Diagnosis present

## 2022-05-15 DIAGNOSIS — T7601XA Adult neglect or abandonment, suspected, initial encounter: Secondary | ICD-10-CM | POA: Diagnosis present

## 2022-05-15 DIAGNOSIS — G309 Alzheimer's disease, unspecified: Secondary | ICD-10-CM | POA: Diagnosis not present

## 2022-05-15 DIAGNOSIS — F0394 Unspecified dementia, unspecified severity, with anxiety: Secondary | ICD-10-CM | POA: Diagnosis present

## 2022-05-15 DIAGNOSIS — W19XXXD Unspecified fall, subsequent encounter: Secondary | ICD-10-CM | POA: Diagnosis not present

## 2022-05-15 DIAGNOSIS — N170 Acute kidney failure with tubular necrosis: Secondary | ICD-10-CM | POA: Diagnosis present

## 2022-05-15 DIAGNOSIS — F03918 Unspecified dementia, unspecified severity, with other behavioral disturbance: Secondary | ICD-10-CM | POA: Diagnosis present

## 2022-05-15 DIAGNOSIS — I132 Hypertensive heart and chronic kidney disease with heart failure and with stage 5 chronic kidney disease, or end stage renal disease: Secondary | ICD-10-CM | POA: Diagnosis present

## 2022-05-15 DIAGNOSIS — R001 Bradycardia, unspecified: Secondary | ICD-10-CM | POA: Diagnosis present

## 2022-05-15 LAB — URINALYSIS, ROUTINE W REFLEX MICROSCOPIC
Bilirubin Urine: NEGATIVE
Glucose, UA: NEGATIVE mg/dL
Hgb urine dipstick: NEGATIVE
Ketones, ur: NEGATIVE mg/dL
Leukocytes,Ua: NEGATIVE
Nitrite: NEGATIVE
Protein, ur: 30 mg/dL — AB
Specific Gravity, Urine: 1.016 (ref 1.005–1.030)
pH: 6 (ref 5.0–8.0)

## 2022-05-15 LAB — CBC
HCT: 33.9 % — ABNORMAL LOW (ref 36.0–46.0)
Hemoglobin: 10.4 g/dL — ABNORMAL LOW (ref 12.0–15.0)
MCH: 30 pg (ref 26.0–34.0)
MCHC: 30.7 g/dL (ref 30.0–36.0)
MCV: 97.7 fL (ref 80.0–100.0)
Platelets: 136 10*3/uL — ABNORMAL LOW (ref 150–400)
RBC: 3.47 MIL/uL — ABNORMAL LOW (ref 3.87–5.11)
RDW: 14.6 % (ref 11.5–15.5)
WBC: 6.4 10*3/uL (ref 4.0–10.5)
nRBC: 0 % (ref 0.0–0.2)

## 2022-05-15 LAB — CREATININE, SERUM
Creatinine, Ser: 1.53 mg/dL — ABNORMAL HIGH (ref 0.44–1.00)
GFR, Estimated: 32 mL/min — ABNORMAL LOW (ref >60)

## 2022-05-15 LAB — CK: Total CK: 51 U/L (ref 38–234)

## 2022-05-15 MED ORDER — ACETAMINOPHEN 650 MG RE SUPP: 650.0000 mg | Freq: Four times a day (QID) | RECTAL | Status: DC | PRN

## 2022-05-15 MED ORDER — NUTRITIONAL SUPPLEMENT PO LIQD
Freq: Three times a day (TID) | ORAL | Status: DC
  Administered 2022-05-15 (×2): 237 mL via ORAL
  Filled 2022-05-15 (×5): qty 237

## 2022-05-15 MED ORDER — ENOXAPARIN SODIUM 40 MG/0.4ML IJ SOSY
40.0000 mg | PREFILLED_SYRINGE | INTRAMUSCULAR | Status: DC
Start: 2022-05-15 — End: 2022-05-16
  Administered 2022-05-15 – 2022-05-16 (×2): 40 mg via SUBCUTANEOUS
  Filled 2022-05-15 (×2): qty 0.4

## 2022-05-15 MED ORDER — DIVALPROEX SODIUM 125 MG PO CSDR
250.0000 mg | DELAYED_RELEASE_CAPSULE | Freq: Three times a day (TID) | ORAL | Status: DC
  Administered 2022-05-15 – 2022-05-22 (×19): 250 mg via ORAL
  Filled 2022-05-15 (×24): qty 2

## 2022-05-15 MED ORDER — LORAZEPAM 2 MG/ML IJ SOLN
2.0000 mg | Freq: Two times a day (BID) | INTRAMUSCULAR | Status: DC | PRN
Start: 2022-05-15 — End: 2022-05-22

## 2022-05-15 MED ORDER — ACETAMINOPHEN 325 MG PO TABS: 650.0000 mg | ORAL_TABLET | Freq: Four times a day (QID) | ORAL | Status: DC | PRN

## 2022-05-15 MED ORDER — HALOPERIDOL 0.5 MG PO TABS
0.5000 mg | ORAL_TABLET | Freq: Two times a day (BID) | ORAL | Status: DC | PRN
  Administered 2022-05-17 – 2022-05-20 (×3): 0.5 mg via ORAL
  Filled 2022-05-15 (×3): qty 1

## 2022-05-15 MED ORDER — ATORVASTATIN CALCIUM 20 MG PO TABS
20.0000 mg | ORAL_TABLET | Freq: Every day | ORAL | Status: DC
  Administered 2022-05-15 – 2022-05-22 (×7): 20 mg via ORAL
  Filled 2022-05-15 (×8): qty 1

## 2022-05-15 MED ORDER — ACETAMINOPHEN 325 MG PO TABS
650.0000 mg | ORAL_TABLET | Freq: Four times a day (QID) | ORAL | Status: DC | PRN
  Administered 2022-05-20 – 2022-05-21 (×2): 650 mg via ORAL
  Filled 2022-05-15 (×2): qty 2

## 2022-05-15 MED ORDER — QUETIAPINE FUMARATE 25 MG PO TABS
25.0000 mg | ORAL_TABLET | Freq: Every day | ORAL | Status: DC
  Administered 2022-05-15 – 2022-05-21 (×6): 25 mg via ORAL
  Filled 2022-05-15 (×7): qty 1

## 2022-05-15 MED ORDER — PANTOPRAZOLE SODIUM 40 MG PO TBEC
40.0000 mg | DELAYED_RELEASE_TABLET | Freq: Every day | ORAL | Status: DC
  Administered 2022-05-15 – 2022-05-22 (×7): 40 mg via ORAL
  Filled 2022-05-15 (×8): qty 1

## 2022-05-15 MED ORDER — HALOPERIDOL 0.5 MG PO TABS
0.5000 mg | ORAL_TABLET | Freq: Two times a day (BID) | ORAL | Status: DC
  Filled 2022-05-15: qty 1

## 2022-05-15 MED ORDER — POLYETHYLENE GLYCOL 3350 17 G PO PACK: 17.0000 g | PACK | Freq: Every day | ORAL | Status: DC | PRN

## 2022-05-15 NOTE — ED Notes (Signed)
Pt in two briefs, strong odor of urine present. Pt cleaned of urine and large amount of stool. Brief replaced.
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
## 2022-05-15 NOTE — Assessment & Plan Note (Signed)
Sliding scale insulin coverage 

## 2022-05-15 NOTE — Progress Notes (Signed)
   05/15/22 0848  Assess: MEWS Score  Temp (!) 94.4 F (34.7 C)  Assess: MEWS Score  MEWS Temp 2  MEWS Systolic 0  MEWS Pulse 0  MEWS RR 0  MEWS LOC 0  MEWS Score 2  MEWS Score Color Yellow  Assess: if the MEWS score is Yellow or Red  Were vital signs taken at a resting state? Yes  Focused Assessment No change from prior assessment  Does the patient meet 2 or more of the SIRS criteria? No  MEWS guidelines implemented *See Row Information* Yes  Treat  MEWS Interventions Escalated (See documentation below)  Pain Scale 0-10  Pain Score 0  Take Vital Signs  Increase Vital Sign Frequency  Yellow: Q 2hr X 2 then Q 4hr X 2, if remains yellow, continue Q 4hrs  Escalate  MEWS: Escalate Yellow: discuss with charge nurse/RN and consider discussing with provider and RRT  Notify: Charge Nurse/RN  Name of Charge Nurse/RN Notified Siri Cole  Date Charge Nurse/RN Notified 05/15/22  Time Charge Nurse/RN Notified 0850  Document  Patient Outcome Other (Comment) (Continue to monitor)  Assess: SIRS CRITERIA  SIRS Temperature  1  SIRS Pulse 0  SIRS Respirations  0  SIRS WBC 0  SIRS Score Sum  1
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
## 2022-05-15 NOTE — Assessment & Plan Note (Addendum)
Suspect with some psychotic features Continue  Depakote, quetiapine.  Haldol and lorazepam as needed Consider psych evaluation

## 2022-05-15 NOTE — Assessment & Plan Note (Signed)
No evidence of injury Fall precautions

## 2022-05-15 NOTE — Assessment & Plan Note (Signed)
Avoid AV nodal blockers

## 2022-05-15 NOTE — Progress Notes (Signed)
   05/15/22 0684  Notify: Provider  Provider Name/Title Flora Lipps  Date Provider Notified 05/15/22  Time Provider Notified [phone]  Method of Notification Page  Notification Reason Other (Comment) (Rectal temperature of 94.4)  Provider response See new orders  Date of Provider Response 05/15/22  Time of Provider Response [phone]

## 2022-05-15 NOTE — Assessment & Plan Note (Signed)
Renal function at baseline 
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
## 2022-05-16 DIAGNOSIS — R001 Bradycardia, unspecified: Secondary | ICD-10-CM

## 2022-05-16 DIAGNOSIS — Z789 Other specified health status: Secondary | ICD-10-CM

## 2022-05-16 DIAGNOSIS — W19XXXA Unspecified fall, initial encounter: Secondary | ICD-10-CM | POA: Diagnosis not present

## 2022-05-16 DIAGNOSIS — E119 Type 2 diabetes mellitus without complications: Secondary | ICD-10-CM | POA: Diagnosis not present

## 2022-05-16 DIAGNOSIS — G309 Alzheimer's disease, unspecified: Secondary | ICD-10-CM

## 2022-05-16 DIAGNOSIS — E1122 Type 2 diabetes mellitus with diabetic chronic kidney disease: Secondary | ICD-10-CM | POA: Diagnosis not present

## 2022-05-16 DIAGNOSIS — F0284 Dementia in other diseases classified elsewhere, unspecified severity, with anxiety: Secondary | ICD-10-CM

## 2022-05-16 DIAGNOSIS — N184 Chronic kidney disease, stage 4 (severe): Secondary | ICD-10-CM

## 2022-05-16 DIAGNOSIS — T7601XA Adult neglect or abandonment, suspected, initial encounter: Secondary | ICD-10-CM

## 2022-05-16 LAB — CBC
HCT: 30.1 % — ABNORMAL LOW (ref 36.0–46.0)
Hemoglobin: 9.3 g/dL — ABNORMAL LOW (ref 12.0–15.0)
MCH: 30.5 pg (ref 26.0–34.0)
MCHC: 30.9 g/dL (ref 30.0–36.0)
MCV: 98.7 fL (ref 80.0–100.0)
Platelets: 129 10*3/uL — ABNORMAL LOW (ref 150–400)
RBC: 3.05 MIL/uL — ABNORMAL LOW (ref 3.87–5.11)
RDW: 14.8 % (ref 11.5–15.5)
WBC: 7.1 10*3/uL (ref 4.0–10.5)
nRBC: 0 % (ref 0.0–0.2)

## 2022-05-16 LAB — BASIC METABOLIC PANEL
Anion gap: <0 — ABNORMAL LOW (ref 5–15)
BUN: 38 mg/dL — ABNORMAL HIGH (ref 8–23)
CO2: 27 mmol/L (ref 22–32)
Calcium: 8.8 mg/dL — ABNORMAL LOW (ref 8.9–10.3)
Chloride: 115 mmol/L — ABNORMAL HIGH (ref 98–111)
Creatinine, Ser: 1.82 mg/dL — ABNORMAL HIGH (ref 0.44–1.00)
GFR, Estimated: 26 mL/min — ABNORMAL LOW (ref >60)
Glucose, Bld: 77 mg/dL (ref 70–99)
Potassium: 5 mmol/L (ref 3.5–5.1)
Sodium: 141 mmol/L (ref 135–145)

## 2022-05-16 LAB — TSH: TSH: 1.776 u[IU]/mL (ref 0.350–4.500)

## 2022-05-16 LAB — MAGNESIUM: Magnesium: 1.7 mg/dL (ref 1.7–2.4)

## 2022-05-16 MED ORDER — ENOXAPARIN SODIUM 30 MG/0.3ML IJ SOSY
30.0000 mg | PREFILLED_SYRINGE | INTRAMUSCULAR | Status: DC
  Administered 2022-05-17 – 2022-05-22 (×5): 30 mg via SUBCUTANEOUS
  Filled 2022-05-16 (×6): qty 0.3

## 2022-05-16 MED ORDER — ADULT MULTIVITAMIN W/MINERALS CH
1.0000 | ORAL_TABLET | Freq: Every day | ORAL | Status: DC
  Administered 2022-05-17 – 2022-05-22 (×5): 1 via ORAL
  Filled 2022-05-16 (×6): qty 1

## 2022-05-16 MED ORDER — ENSURE ENLIVE PO LIQD
237.0000 mL | Freq: Three times a day (TID) | ORAL | Status: DC
  Administered 2022-05-16 – 2022-05-19 (×8): 237 mL via ORAL
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
## 2022-05-16 NOTE — Progress Notes (Signed)
PHARMACIST - PHYSICIAN COMMUNICATION  CONCERNING:  Enoxaparin (Lovenox) for DVT Prophylaxis    RECOMMENDATION: Patient was prescribed enoxaprin 40mg  q24 hours for VTE prophylaxis.   Filed Weights   05/14/22 1931  Weight: 95 kg (209 lb 7 oz)    Body mass index is 30.93 kg/m.  Estimated Creatinine Clearance: 25.2 mL/min (A) (by C-G formula based on SCr of 1.82 mg/dL (H)).  Patient is candidate for enoxaparin 30mg  every 24 hours based on CrCl <40ml/min or Weight <45kg  DESCRIPTION: Pharmacy has adjusted enoxaparin dose per [facility] policy.  Patient is now receiving enoxaparin 30 mg every 24 hours    Wynelle Cleveland, PharmD Clinical Pharmacist  05/16/2022 8:25 AM
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
## 2022-05-17 DIAGNOSIS — T7601XD Adult neglect or abandonment, suspected, subsequent encounter: Secondary | ICD-10-CM

## 2022-05-17 DIAGNOSIS — W19XXXD Unspecified fall, subsequent encounter: Secondary | ICD-10-CM

## 2022-05-17 DIAGNOSIS — G309 Alzheimer's disease, unspecified: Secondary | ICD-10-CM | POA: Diagnosis not present

## 2022-05-17 DIAGNOSIS — E875 Hyperkalemia: Secondary | ICD-10-CM

## 2022-05-17 DIAGNOSIS — E119 Type 2 diabetes mellitus without complications: Secondary | ICD-10-CM | POA: Diagnosis not present

## 2022-05-17 DIAGNOSIS — E1122 Type 2 diabetes mellitus with diabetic chronic kidney disease: Secondary | ICD-10-CM | POA: Diagnosis not present

## 2022-05-17 LAB — CBC
HCT: 33.7 % — ABNORMAL LOW (ref 36.0–46.0)
Hemoglobin: 10.4 g/dL — ABNORMAL LOW (ref 12.0–15.0)
MCH: 30.5 pg (ref 26.0–34.0)
MCHC: 30.9 g/dL (ref 30.0–36.0)
MCV: 98.8 fL (ref 80.0–100.0)
Platelets: 128 10*3/uL — ABNORMAL LOW (ref 150–400)
RBC: 3.41 MIL/uL — ABNORMAL LOW (ref 3.87–5.11)
RDW: 14.9 % (ref 11.5–15.5)
WBC: 7.4 10*3/uL (ref 4.0–10.5)
nRBC: 0 % (ref 0.0–0.2)

## 2022-05-17 LAB — BASIC METABOLIC PANEL
Anion gap: 6 (ref 5–15)
BUN: 45 mg/dL — ABNORMAL HIGH (ref 8–23)
CO2: 23 mmol/L (ref 22–32)
Calcium: 9.2 mg/dL (ref 8.9–10.3)
Chloride: 109 mmol/L (ref 98–111)
Creatinine, Ser: 1.97 mg/dL — ABNORMAL HIGH (ref 0.44–1.00)
GFR, Estimated: 24 mL/min — ABNORMAL LOW (ref >60)
Glucose, Bld: 125 mg/dL — ABNORMAL HIGH (ref 70–99)
Potassium: 5.4 mmol/L — ABNORMAL HIGH (ref 3.5–5.1)
Sodium: 138 mmol/L (ref 135–145)

## 2022-05-17 MED ORDER — LACTATED RINGERS IV SOLN: INTRAVENOUS | Status: DC

## 2022-05-17 MED ORDER — SODIUM ZIRCONIUM CYCLOSILICATE 5 G PO PACK
5.0000 g | PACK | Freq: Once | ORAL | Status: AC
  Administered 2022-05-17: 5 g via ORAL
  Filled 2022-05-17: qty 1
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
## 2022-05-17 NOTE — Progress Notes (Signed)
   05/16/22 2130  Mobility  HOB Elevated/Bed Position Self regulated  Activity Transferred from chair to bed  Range of Motion/Exercises Active;All extremities  Level of Assistance Maximum assist, patient does 25-49%  Assistive Device Front wheel walker  Distance Ambulated (ft) 5 ft  Activity Response Tolerated well

## 2022-05-17 NOTE — TOC Progression Note (Signed)
Transition of Care [facility]) - Progression Note    Patient Details  Name: [name] [name] MRN: [id number] Date of Birth: Mar 27, 1930  Transition of Care [facility]) CM/SW [place], LCSW Phone Number: 05/17/2022, 10:08 AM  Clinical Narrative:    Patient has a bed offer at [facility] CSW called Debbie in Admissions and left VM requesting confirmation that she knows it is for a Medicaid long term bed. TOC to follow up after confirmation from [place].    Expected Discharge Plan: Long Term Nursing Home Barriers to Discharge: SNF Pending bed offer (long term bed placement)  Expected Discharge Plan and Services Expected Discharge Plan: [place]                                               Social Determinants of Health (SDOH) Interventions    Readmission Risk Interventions    04/17/2022   12:10 PM 09/12/2021    2:32 PM  Readmission Risk Prevention Plan  Transportation Screening Complete Complete  Medication Review (RN Care Manager)  Complete  HRI or Home Care Consult Complete   SW Recovery Care/Counseling Consult Complete Complete  Palliative Care Screening Complete Complete  Skilled Nursing Facility Complete Complete
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
## 2022-05-18 ENCOUNTER — Inpatient Hospital Stay: Payer: Medicare Other

## 2022-05-18 DIAGNOSIS — G309 Alzheimer's disease, unspecified: Secondary | ICD-10-CM | POA: Diagnosis not present

## 2022-05-18 DIAGNOSIS — W19XXXD Unspecified fall, subsequent encounter: Secondary | ICD-10-CM | POA: Diagnosis not present

## 2022-05-18 DIAGNOSIS — E1122 Type 2 diabetes mellitus with diabetic chronic kidney disease: Secondary | ICD-10-CM | POA: Diagnosis not present

## 2022-05-18 DIAGNOSIS — E119 Type 2 diabetes mellitus without complications: Secondary | ICD-10-CM | POA: Diagnosis not present

## 2022-05-18 LAB — FOLATE: Folate: 14.6 ng/mL (ref >5.9)

## 2022-05-18 LAB — BASIC METABOLIC PANEL
Anion gap: 6 (ref 5–15)
BUN: 64 mg/dL — ABNORMAL HIGH (ref 8–23)
CO2: 25 mmol/L (ref 22–32)
Calcium: 8.7 mg/dL — ABNORMAL LOW (ref 8.9–10.3)
Chloride: 108 mmol/L (ref 98–111)
Creatinine, Ser: 2.26 mg/dL — ABNORMAL HIGH (ref 0.44–1.00)
GFR, Estimated: 20 mL/min — ABNORMAL LOW (ref >60)
Glucose, Bld: 89 mg/dL (ref 70–99)
Potassium: 5.5 mmol/L — ABNORMAL HIGH (ref 3.5–5.1)
Sodium: 139 mmol/L (ref 135–145)

## 2022-05-18 LAB — PROTEIN / CREATININE RATIO, URINE
Creatinine, Urine: 81 mg/dL
Total Protein, Urine: <6 mg/dL

## 2022-05-18 LAB — IRON AND TIBC
Iron: 38 ug/dL (ref 28–170)
Saturation Ratios: 17 % (ref 10.4–31.8)
TIBC: 220 ug/dL — ABNORMAL LOW (ref 250–450)
UIBC: 182 ug/dL

## 2022-05-18 LAB — PHOSPHORUS: Phosphorus: 3.9 mg/dL (ref 2.5–4.6)

## 2022-05-18 LAB — FERRITIN: Ferritin: 60 ng/mL (ref 11–307)

## 2022-05-18 MED ORDER — SODIUM ZIRCONIUM CYCLOSILICATE 10 G PO PACK
10.0000 g | PACK | Freq: Every day | ORAL | Status: DC
  Administered 2022-05-18: 10 g via ORAL
  Filled 2022-05-18 (×2): qty 1

## 2022-05-18 MED ORDER — SODIUM CHLORIDE 0.9 % IV SOLN: INTRAVENOUS | Status: DC

## 2022-05-18 NOTE — Progress Notes (Signed)
Foley Catheter ordered, patient refused foley catheter insertion. She stated "I can pee just fine" even after patient education. MD made aware. Patient voided 250 mL then bladder scan post void showed 57 mL, MD made aware.
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
## 2022-05-19 DIAGNOSIS — N179 Acute kidney failure, unspecified: Secondary | ICD-10-CM | POA: Diagnosis not present

## 2022-05-19 DIAGNOSIS — G309 Alzheimer's disease, unspecified: Secondary | ICD-10-CM | POA: Diagnosis not present

## 2022-05-19 DIAGNOSIS — E1122 Type 2 diabetes mellitus with diabetic chronic kidney disease: Secondary | ICD-10-CM | POA: Diagnosis not present

## 2022-05-19 DIAGNOSIS — W19XXXD Unspecified fall, subsequent encounter: Secondary | ICD-10-CM | POA: Diagnosis not present

## 2022-05-19 LAB — BASIC METABOLIC PANEL
Anion gap: 6 (ref 5–15)
BUN: 57 mg/dL — ABNORMAL HIGH (ref 8–23)
CO2: 26 mmol/L (ref 22–32)
Calcium: 9.2 mg/dL (ref 8.9–10.3)
Chloride: 109 mmol/L (ref 98–111)
Creatinine, Ser: 1.8 mg/dL — ABNORMAL HIGH (ref 0.44–1.00)
GFR, Estimated: 26 mL/min — ABNORMAL LOW (ref >60)
Glucose, Bld: 93 mg/dL (ref 70–99)
Potassium: 5.7 mmol/L — ABNORMAL HIGH (ref 3.5–5.1)
Sodium: 141 mmol/L (ref 135–145)

## 2022-05-19 LAB — VITAMIN B12: Vitamin B-12: 223 pg/mL (ref 180–914)

## 2022-05-19 MED ORDER — NEPRO/CARBSTEADY PO LIQD
237.0000 mL | Freq: Two times a day (BID) | ORAL | Status: DC
  Administered 2022-05-19 – 2022-05-20 (×3): 237 mL via ORAL

## 2022-05-19 MED ORDER — SODIUM POLYSTYRENE SULFONATE 15 GM/60ML PO SUSP
15.0000 g | Freq: Once | ORAL | Status: AC
Start: 2022-05-19 — End: 2022-05-19
  Administered 2022-05-19: 15 g via ORAL
  Filled 2022-05-19: qty 60

## 2022-05-19 NOTE — TOC Progression Note (Signed)
Transition of Care [facility]) - Progression Note    Patient Details  Name: [name] [name] MRN: [id number] Date of Birth: 1929/12/04  Transition of Care [facility]) CM/SW [place], RN Phone Number: 05/19/2022, 10:35 AM  Clinical Narrative:   Left message for Debbie at [facility] re: patient transfer for [place]    Expected Discharge Plan: Long Term Nursing Home Barriers to Discharge: SNF Pending bed offer (long term bed placement)  Expected Discharge Plan and Services Expected Discharge Plan: [place]                                               Social Determinants of Health (SDOH) Interventions    Readmission Risk Interventions    04/17/2022   12:10 PM 09/12/2021    2:32 PM  Readmission Risk Prevention Plan  Transportation Screening Complete Complete  Medication Review (RN Care Manager)  Complete  HRI or Home Care Consult Complete   SW Recovery Care/Counseling Consult Complete Complete  Palliative Care Screening Complete Complete  Skilled Nursing Facility Complete Complete

## 2022-05-19 NOTE — Care Management Important Message (Signed)
Important Message  Patient Details  Name: Dinora Hemm MRN: [id number] Date of Birth: [birth year]   Medicare Important Message Given:  Yes     Juliann Pulse A Azaryah Oleksy 05/19/2022, 11:29 AM

## 2022-05-19 NOTE — Progress Notes (Signed)
PT Cancellation Note  Patient Details Name: [name] [name] MRN: [id number] DOB: August 11, 1930   Cancelled Treatment:    Reason Eval/Treat Not Completed: Patient not medically ready.  Chart reviewed.  Pt's potassium has been up-trending and noted to be 5.7 this morning.  Per therapy guidelines for elevated potassium, exertional activity contra-indicated.  Will hold therapy at this time and re-attempt PT session at a later date/time.  Leitha Bleak, PT 05/19/22, 10:58 AM
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
## 2022-05-20 DIAGNOSIS — W19XXXD Unspecified fall, subsequent encounter: Secondary | ICD-10-CM | POA: Diagnosis not present

## 2022-05-20 DIAGNOSIS — N179 Acute kidney failure, unspecified: Secondary | ICD-10-CM | POA: Diagnosis not present

## 2022-05-20 DIAGNOSIS — G309 Alzheimer's disease, unspecified: Secondary | ICD-10-CM | POA: Diagnosis not present

## 2022-05-20 DIAGNOSIS — E1122 Type 2 diabetes mellitus with diabetic chronic kidney disease: Secondary | ICD-10-CM | POA: Diagnosis not present

## 2022-05-20 LAB — BASIC METABOLIC PANEL
Anion gap: 7 (ref 5–15)
BUN: 54 mg/dL — ABNORMAL HIGH (ref 8–23)
CO2: 27 mmol/L (ref 22–32)
Calcium: 9 mg/dL (ref 8.9–10.3)
Chloride: 108 mmol/L (ref 98–111)
Creatinine, Ser: 1.68 mg/dL — ABNORMAL HIGH (ref 0.44–1.00)
GFR, Estimated: 29 mL/min — ABNORMAL LOW (ref >60)
Glucose, Bld: 89 mg/dL (ref 70–99)
Potassium: 5 mmol/L (ref 3.5–5.1)
Sodium: 142 mmol/L (ref 135–145)

## 2022-05-20 NOTE — TOC Progression Note (Signed)
Transition of Care [facility]) - Progression Note    Patient Details  Name: [name] [name] MRN: [id number] Date of Birth: 09-Feb-1930  Transition of Care [facility]) CM/SW [place], RN Phone Number: 05/20/2022, 2:48 PM  Clinical Narrative:   RNCM spoke with Jackelyn Poling at [facility], who does not believe facility can accept patient due to her current memory issues.  DSS worker, who did not identify himself, contacted RNCM yesterday, but did not leave phone number and contact information.  He did request RNCM contact information and it was provided to him at that time for call back. RNCM has not yet received a call back.  Message left with [place] to call regarding patient placement.  Message left for granddaughter to call RNCM, awaiting return call.    Expected Discharge Plan: Long Term Nursing Home Barriers to Discharge: SNF Pending bed offer (long term bed placement)  Expected Discharge Plan and Services Expected Discharge Plan: [place]                                               Social Determinants of Health (SDOH) Interventions    Readmission Risk Interventions    04/17/2022   12:10 PM 09/12/2021    2:32 PM  Readmission Risk Prevention Plan  Transportation Screening Complete Complete  Medication Review (RN Care Manager)  Complete  HRI or Home Care Consult Complete   SW Recovery Care/Counseling Consult Complete Complete  Palliative Care Screening Complete Complete  Skilled Nursing Facility Complete Complete
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
## 2022-05-20 NOTE — Plan of Care (Signed)
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
## 2022-05-21 DIAGNOSIS — G309 Alzheimer's disease, unspecified: Secondary | ICD-10-CM | POA: Diagnosis not present

## 2022-05-21 DIAGNOSIS — W19XXXD Unspecified fall, subsequent encounter: Secondary | ICD-10-CM | POA: Diagnosis not present

## 2022-05-21 DIAGNOSIS — F0284 Dementia in other diseases classified elsewhere, unspecified severity, with anxiety: Secondary | ICD-10-CM | POA: Diagnosis not present

## 2022-05-21 LAB — BASIC METABOLIC PANEL
Anion gap: 3 — ABNORMAL LOW (ref 5–15)
BUN: 48 mg/dL — ABNORMAL HIGH (ref 8–23)
CO2: 27 mmol/L (ref 22–32)
Calcium: 9 mg/dL (ref 8.9–10.3)
Chloride: 109 mmol/L (ref 98–111)
Creatinine, Ser: 1.58 mg/dL — ABNORMAL HIGH (ref 0.44–1.00)
GFR, Estimated: 31 mL/min — ABNORMAL LOW (ref >60)
Glucose, Bld: 83 mg/dL (ref 70–99)
Potassium: 4.8 mmol/L (ref 3.5–5.1)
Sodium: 139 mmol/L (ref 135–145)

## 2022-05-21 LAB — CBC
HCT: 29.4 % — ABNORMAL LOW (ref 36.0–46.0)
Hemoglobin: 8.9 g/dL — ABNORMAL LOW (ref 12.0–15.0)
MCH: 29.7 pg (ref 26.0–34.0)
MCHC: 30.3 g/dL (ref 30.0–36.0)
MCV: 98 fL (ref 80.0–100.0)
Platelets: 123 10*3/uL — ABNORMAL LOW (ref 150–400)
RBC: 3 MIL/uL — ABNORMAL LOW (ref 3.87–5.11)
RDW: 14.1 % (ref 11.5–15.5)
WBC: 6.9 10*3/uL (ref 4.0–10.5)
nRBC: 0 % (ref 0.0–0.2)

## 2022-05-21 NOTE — TOC Progression Note (Signed)
Transition of Care [facility]) - Progression Note    Patient Details  Name: [name] [name] MRN: [id number] Date of Birth: [birth year]  Transition [facility]) CM/SW Contact  Eileen Stanford, LCSW Phone Number: 05/21/2022, 2:11 PM  Clinical Narrative:   CSW confirmd with Debbie with [facility] in [place]) they can take pt. Auth started through [employer] portal.    Expected Discharge Plan: Long Term Nursing Home Barriers to Discharge: SNF Pending bed offer (long term bed placement)  Expected Discharge Plan and Services Expected Discharge Plan: [place]                                               Social Determinants of Health (SDOH) Interventions    Readmission Risk Interventions    04/17/2022   12:10 PM 09/12/2021    2:32 PM  Readmission Risk Prevention Plan  Transportation Screening Complete Complete  Medication Review [employer])  Complete  HRI or Home Care Consult Complete   SW Recovery Care/Counseling Consult Complete Complete  Palliative Care Screening Complete Complete  Skilled Nursing Facility Complete Complete
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
## 2022-05-22 DIAGNOSIS — W19XXXD Unspecified fall, subsequent encounter: Secondary | ICD-10-CM | POA: Diagnosis not present

## 2022-05-22 DIAGNOSIS — G309 Alzheimer's disease, unspecified: Secondary | ICD-10-CM | POA: Diagnosis not present

## 2022-05-22 LAB — CREATININE, SERUM
Creatinine, Ser: 1.61 mg/dL — ABNORMAL HIGH (ref 0.44–1.00)
GFR, Estimated: 30 mL/min — ABNORMAL LOW (ref >60)

## 2022-05-22 MED ORDER — HALOPERIDOL 0.5 MG PO TABS: 0.5000 mg | ORAL_TABLET | Freq: Two times a day (BID) | ORAL | Status: AC | PRN

## 2022-05-22 NOTE — Care Management Important Message (Signed)
Important Message  Patient Details  Name: [name] [name] MRN: [id number] Date of Birth: 05/03/1930   Medicare Important Message Given:  Yes     Juliann Pulse A Benjamyn Hestand 05/22/2022, 11:36 AM

## 2022-05-22 NOTE — TOC Transition Note (Signed)
Transition of Care [facility]) - CM/SW Discharge Note   Patient Details  Name: [name] [name] MRN: [id number] Date of Birth: 1930/05/31  [facility] CM/SW Contact:  Eileen Stanford, LCSW Phone Number: 05/22/2022, 10:32 AM   Clinical Narrative:   Clinical Social Worker facilitated patient discharge including contacting patient family and facility to confirm patient discharge plans.  Clinical information faxed to facility and family agreeable with plan.  CSW arranged ambulance transport via [facility] to [facility] [place] [facility] [place]) room B16-1.  RN to call [phone] for report prior to discharge.     Final next level of care: Skilled Nursing Facility Barriers to Discharge: No Barriers Identified   Patient Goals and CMS Choice        Discharge Placement              Patient chooses bed at:  ([facility] [facility] [place] Patient to be transferred to facility by: [facility] Name of family member notified: daughter Patient and family notified of of transfer: 05/22/22  Discharge Plan and Services                                     Social Determinants of Health (SDOH) Interventions     Readmission Risk Interventions    04/17/2022   12:10 PM 09/12/2021    2:32 PM  Readmission Risk Prevention Plan  Transportation Screening Complete Complete  Medication Review (RN Care Manager)  Complete  HRI or Home Care Consult Complete   SW Recovery Care/Counseling Consult Complete Complete  Palliative Care Screening Complete Complete  Skilled Nursing Facility Complete Complete
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
## 2022-05-22 NOTE — TOC Progression Note (Signed)
Transition of Care [facility]) - Progression Note    Patient Details  Name: [name] [name] MRN: [id number] Date of Birth: May 20, 1930  [facility] CM/SW Contact  Eileen Stanford, LCSW Phone Number: 05/22/2022, 9:32 AM  Clinical Narrative:   CSW obtained [place] for [place]. MD notified.    Expected Discharge Plan: Long Term Nursing Home Barriers to Discharge: SNF Pending bed offer (long term bed placement)  Expected Discharge Plan and Services Expected Discharge Plan: [place]                                               Social Determinants of Health (SDOH) Interventions    Readmission Risk Interventions    04/17/2022   12:10 PM 09/12/2021    2:32 PM  Readmission Risk Prevention Plan  Transportation Screening Complete Complete  Medication Review (RN Care Manager)  Complete  HRI or Home Care Consult Complete   SW Recovery Care/Counseling Consult Complete Complete  Palliative Care Screening Complete Complete  Skilled Nursing Facility Complete Complete

## 2022-11-27 ENCOUNTER — Emergency Department (HOSPITAL_COMMUNITY): Payer: Medicare Other

## 2022-11-27 ENCOUNTER — Encounter (HOSPITAL_COMMUNITY): Payer: Self-pay | Admitting: *Deleted

## 2022-11-27 ENCOUNTER — Other Ambulatory Visit: Payer: Self-pay

## 2022-11-27 ENCOUNTER — Inpatient Hospital Stay (HOSPITAL_COMMUNITY)
Admission: EM | Admit: 2022-11-27 | Discharge: 2022-12-04 | DRG: 871 | Disposition: A | Discharge status: Skilled Nursing Facility | Payer: Medicare Other | Source: Skilled Nursing Facility | Attending: Internal Medicine | Admitting: Internal Medicine

## 2022-11-27 DIAGNOSIS — N179 Acute kidney failure, unspecified: Secondary | ICD-10-CM | POA: Diagnosis present

## 2022-11-27 DIAGNOSIS — B379 Candidiasis, unspecified: Secondary | ICD-10-CM | POA: Diagnosis present

## 2022-11-27 DIAGNOSIS — E875 Hyperkalemia: Secondary | ICD-10-CM | POA: Diagnosis not present

## 2022-11-27 DIAGNOSIS — F03C4 Unspecified dementia, severe, with anxiety: Secondary | ICD-10-CM | POA: Diagnosis present

## 2022-11-27 DIAGNOSIS — Z7189 Other specified counseling: Secondary | ICD-10-CM | POA: Diagnosis not present

## 2022-11-27 DIAGNOSIS — I5032 Chronic diastolic (congestive) heart failure: Secondary | ICD-10-CM | POA: Diagnosis present

## 2022-11-27 DIAGNOSIS — G928 Other toxic encephalopathy: Secondary | ICD-10-CM | POA: Diagnosis present

## 2022-11-27 DIAGNOSIS — I130 Hypertensive heart and chronic kidney disease with heart failure and stage 1 through stage 4 chronic kidney disease, or unspecified chronic kidney disease: Secondary | ICD-10-CM | POA: Diagnosis present

## 2022-11-27 DIAGNOSIS — J9811 Atelectasis: Secondary | ICD-10-CM | POA: Diagnosis present

## 2022-11-27 DIAGNOSIS — E669 Obesity, unspecified: Secondary | ICD-10-CM | POA: Diagnosis present

## 2022-11-27 DIAGNOSIS — Z515 Encounter for palliative care: Secondary | ICD-10-CM

## 2022-11-27 DIAGNOSIS — G309 Alzheimer's disease, unspecified: Secondary | ICD-10-CM | POA: Diagnosis not present

## 2022-11-27 DIAGNOSIS — N390 Urinary tract infection, site not specified: Secondary | ICD-10-CM | POA: Diagnosis present

## 2022-11-27 DIAGNOSIS — Z888 Allergy status to other drugs, medicaments and biological substances status: Secondary | ICD-10-CM

## 2022-11-27 DIAGNOSIS — E1165 Type 2 diabetes mellitus with hyperglycemia: Secondary | ICD-10-CM | POA: Diagnosis present

## 2022-11-27 DIAGNOSIS — E0591 Thyrotoxicosis, unspecified with thyrotoxic crisis or storm: Secondary | ICD-10-CM | POA: Diagnosis present

## 2022-11-27 DIAGNOSIS — E860 Dehydration: Secondary | ICD-10-CM

## 2022-11-27 DIAGNOSIS — K7682 Hepatic encephalopathy: Secondary | ICD-10-CM | POA: Diagnosis present

## 2022-11-27 DIAGNOSIS — E785 Hyperlipidemia, unspecified: Secondary | ICD-10-CM | POA: Diagnosis present

## 2022-11-27 DIAGNOSIS — L89616 Pressure-induced deep tissue damage of right heel: Secondary | ICD-10-CM | POA: Diagnosis not present

## 2022-11-27 DIAGNOSIS — G9341 Metabolic encephalopathy: Secondary | ICD-10-CM | POA: Diagnosis present

## 2022-11-27 DIAGNOSIS — A419 Sepsis, unspecified organism: Secondary | ICD-10-CM | POA: Diagnosis present

## 2022-11-27 DIAGNOSIS — N184 Chronic kidney disease, stage 4 (severe): Secondary | ICD-10-CM | POA: Diagnosis present

## 2022-11-27 DIAGNOSIS — Z1152 Encounter for screening for COVID-19: Secondary | ICD-10-CM

## 2022-11-27 DIAGNOSIS — E870 Hyperosmolality and hypernatremia: Secondary | ICD-10-CM | POA: Diagnosis not present

## 2022-11-27 DIAGNOSIS — F29 Unspecified psychosis not due to a substance or known physiological condition: Secondary | ICD-10-CM | POA: Diagnosis present

## 2022-11-27 DIAGNOSIS — N19 Unspecified kidney failure: Secondary | ICD-10-CM

## 2022-11-27 DIAGNOSIS — Z66 Do not resuscitate: Secondary | ICD-10-CM | POA: Diagnosis present

## 2022-11-27 DIAGNOSIS — R29810 Facial weakness: Secondary | ICD-10-CM | POA: Diagnosis present

## 2022-11-27 DIAGNOSIS — K5900 Constipation, unspecified: Secondary | ICD-10-CM | POA: Diagnosis present

## 2022-11-27 DIAGNOSIS — F03C2 Unspecified dementia, severe, with psychotic disturbance: Secondary | ICD-10-CM | POA: Diagnosis present

## 2022-11-27 DIAGNOSIS — Z6830 Body mass index (BMI) 30.0-30.9, adult: Secondary | ICD-10-CM

## 2022-11-27 DIAGNOSIS — K219 Gastro-esophageal reflux disease without esophagitis: Secondary | ICD-10-CM | POA: Diagnosis present

## 2022-11-27 DIAGNOSIS — Z79899 Other long term (current) drug therapy: Secondary | ICD-10-CM

## 2022-11-27 DIAGNOSIS — E1122 Type 2 diabetes mellitus with diabetic chronic kidney disease: Secondary | ICD-10-CM | POA: Diagnosis present

## 2022-11-27 DIAGNOSIS — F0394 Unspecified dementia, unspecified severity, with anxiety: Secondary | ICD-10-CM | POA: Diagnosis present

## 2022-11-27 DIAGNOSIS — E119 Type 2 diabetes mellitus without complications: Secondary | ICD-10-CM

## 2022-11-27 DIAGNOSIS — R6520 Severe sepsis without septic shock: Secondary | ICD-10-CM | POA: Diagnosis present

## 2022-11-27 DIAGNOSIS — I10 Essential (primary) hypertension: Secondary | ICD-10-CM | POA: Diagnosis present

## 2022-11-27 DIAGNOSIS — F0284 Dementia in other diseases classified elsewhere, unspecified severity, with anxiety: Secondary | ICD-10-CM | POA: Diagnosis not present

## 2022-11-27 LAB — LACTIC ACID, PLASMA
Lactic Acid, Venous: 1.2 mmol/L (ref 0.5–1.9)
Lactic Acid, Venous: 1.2 mmol/L (ref 0.5–1.9)

## 2022-11-27 LAB — CBC WITH DIFFERENTIAL/PLATELET
Abs Immature Granulocytes: 0.65 10*3/uL — ABNORMAL HIGH (ref 0.00–0.07)
Basophils Absolute: 0.1 10*3/uL (ref 0.0–0.1)
Basophils Relative: 1 %
Eosinophils Absolute: 0 10*3/uL (ref 0.0–0.5)
Eosinophils Relative: 0 %
HCT: 34.3 % — ABNORMAL LOW (ref 36.0–46.0)
Hemoglobin: 10.4 g/dL — ABNORMAL LOW (ref 12.0–15.0)
Immature Granulocytes: 4 %
Lymphocytes Relative: 11 %
Lymphs Abs: 1.6 10*3/uL (ref 0.7–4.0)
MCH: 31.1 pg (ref 26.0–34.0)
MCHC: 30.3 g/dL (ref 30.0–36.0)
MCV: 102.7 fL — ABNORMAL HIGH (ref 80.0–100.0)
Monocytes Absolute: 1.8 10*3/uL — ABNORMAL HIGH (ref 0.1–1.0)
Monocytes Relative: 11 %
Neutro Abs: 11.1 10*3/uL — ABNORMAL HIGH (ref 1.7–7.7)
Neutrophils Relative %: 73 %
Platelets: 325 10*3/uL (ref 150–400)
RBC: 3.34 MIL/uL — ABNORMAL LOW (ref 3.87–5.11)
RDW: 13.2 % (ref 11.5–15.5)
WBC: 15.3 10*3/uL — ABNORMAL HIGH (ref 4.0–10.5)
nRBC: 0.2 % (ref 0.0–0.2)

## 2022-11-27 LAB — BLOOD GAS, VENOUS
Acid-Base Excess: 7.1 mmol/L — ABNORMAL HIGH (ref 0.0–2.0)
Bicarbonate: 33.7 mmol/L — ABNORMAL HIGH (ref 20.0–28.0)
Drawn by: 7012
O2 Saturation: 30.5 %
Patient temperature: 37.2
pCO2, Ven: 58 mmHg (ref 44–60)
pH, Ven: 7.38 (ref 7.25–7.43)
pO2, Ven: <31 mmHg — CL (ref 32–45)

## 2022-11-27 LAB — URINALYSIS, ROUTINE W REFLEX MICROSCOPIC
Glucose, UA: NEGATIVE mg/dL
Hgb urine dipstick: NEGATIVE
Ketones, ur: NEGATIVE mg/dL
Nitrite: NEGATIVE
Protein, ur: 100 mg/dL — AB
Specific Gravity, Urine: 1.01 (ref 1.005–1.030)
pH: 8 (ref 5.0–8.0)

## 2022-11-27 LAB — RESP PANEL BY RT-PCR (RSV, FLU A&B, COVID)  RVPGX2
Influenza A by PCR: NEGATIVE
Influenza B by PCR: NEGATIVE
Resp Syncytial Virus by PCR: NEGATIVE
SARS Coronavirus 2 by RT PCR: NEGATIVE

## 2022-11-27 LAB — TROPONIN I (HIGH SENSITIVITY)
Troponin I (High Sensitivity): 42 ng/L — ABNORMAL HIGH (ref <18)
Troponin I (High Sensitivity): 38 ng/L — ABNORMAL HIGH (ref <18)

## 2022-11-27 LAB — URINALYSIS, MICROSCOPIC (REFLEX)

## 2022-11-27 LAB — COMPREHENSIVE METABOLIC PANEL
ALT: 126 U/L — ABNORMAL HIGH (ref 0–44)
AST: 93 U/L — ABNORMAL HIGH (ref 15–41)
Albumin: 2.9 g/dL — ABNORMAL LOW (ref 3.5–5.0)
Alkaline Phosphatase: 146 U/L — ABNORMAL HIGH (ref 38–126)
Anion gap: 12 (ref 5–15)
BUN: 100 mg/dL — ABNORMAL HIGH (ref 8–23)
CO2: 27 mmol/L (ref 22–32)
Calcium: 9.2 mg/dL (ref 8.9–10.3)
Chloride: 107 mmol/L (ref 98–111)
Creatinine, Ser: 2.71 mg/dL — ABNORMAL HIGH (ref 0.44–1.00)
GFR, Estimated: 16 mL/min — ABNORMAL LOW (ref >60)
Glucose, Bld: 166 mg/dL — ABNORMAL HIGH (ref 70–99)
Potassium: 4.6 mmol/L (ref 3.5–5.1)
Sodium: 146 mmol/L — ABNORMAL HIGH (ref 135–145)
Total Bilirubin: 1.9 mg/dL — ABNORMAL HIGH (ref 0.3–1.2)
Total Protein: 8.4 g/dL — ABNORMAL HIGH (ref 6.5–8.1)

## 2022-11-27 LAB — MAGNESIUM
Magnesium: 2.8 mg/dL — ABNORMAL HIGH (ref 1.7–2.4)
Magnesium: 2.8 mg/dL — ABNORMAL HIGH (ref 1.7–2.4)

## 2022-11-27 LAB — ETHANOL: Alcohol, Ethyl (B): <10 mg/dL (ref <10)

## 2022-11-27 LAB — GLUCOSE, CAPILLARY: Glucose-Capillary: 154 mg/dL — ABNORMAL HIGH (ref 70–99)

## 2022-11-27 MED ORDER — PIPERACILLIN-TAZOBACTAM 3.375 G IVPB 30 MIN
3.3750 g | Freq: Once | INTRAVENOUS | Status: AC
  Administered 2022-11-27: 3.375 g via INTRAVENOUS
  Filled 2022-11-27: qty 50

## 2022-11-27 MED ORDER — LACTATED RINGERS IV BOLUS
500.0000 mL | Freq: Once | INTRAVENOUS | Status: AC
  Administered 2022-11-27: 500 mL via INTRAVENOUS

## 2022-11-27 MED ORDER — HEPARIN SODIUM (PORCINE) 5000 UNIT/ML IJ SOLN
5000.0000 [IU] | Freq: Three times a day (TID) | INTRAMUSCULAR | Status: DC
  Administered 2022-11-27 – 2022-12-04 (×19): 5000 [IU] via SUBCUTANEOUS
  Filled 2022-11-27 (×21): qty 1

## 2022-11-27 MED ORDER — LACTATED RINGERS IV SOLN: INTRAVENOUS | Status: DC

## 2022-11-27 MED ORDER — SODIUM CHLORIDE 0.9 % IV SOLN
2.0000 g | INTRAVENOUS | Status: AC
  Administered 2022-11-27 – 2022-12-03 (×7): 2 g via INTRAVENOUS
  Filled 2022-11-27 (×7): qty 20

## 2022-11-27 NOTE — ED Provider Notes (Signed)
[facility] Provider Note   CSN: [id number] Arrival date & time: 11/27/22  1021     History {Add pertinent medical, surgical, social history, OB history to HPI:1} No chief complaint on file.   [name] [name] is a 89 y.o. female with dementia, CKD stage IV, Z3GU, diastolic heart failure, sinus bradycardia, hypertension, anemia, history of bacteremia, MDD with SI, GERD, obesity, constipation, carotid artery stenosis who presents with c/f sepsis.   Patient presents with concern for altered mental status.  Patient's facility staff stated that she is normally very alert but today has not been responding.  They also note that her urine has smelled very strongly and are concerned about a possible UTI.  T with EMS was 102 F, normotensive, slightly tachycardic.  Patient on my exam is sleepy/drowsy, responds to voice.  She is ANO x 1 only to self.  She does follow commands and responds to questions.  She states that she is normally weak on the left side. I cannot find any documentation in care everywhere or in this chart that patient has had a documented stroke in the past. She is objectively weak on the LUE and has L facial droop ***.   HPI     Home Medications Prior to Admission medications   Medication Sig Start Date End Date Taking? Authorizing Provider  acetaminophen (TYLENOL) 325 MG tablet Take 2 tablets (650 mg total) by mouth every 6 (six) hours as needed for mild pain (or Fever >/= 101). 09/20/21   Nolberto Hanlon, MD  atorvastatin (LIPITOR) 20 MG tablet Take 20 mg by mouth daily. 06/13/21   [provider]  divalproex (DEPAKOTE SPRINKLE) 125 MG capsule Take 250 mg by mouth 3 (three) times daily.    [provider]  haloperidol (HALDOL) 0.5 MG tablet Take 1 tablet (0.5 mg total) by mouth 2 (two) times daily as needed for agitation. 05/22/22   Sidney Ace, MD  loperamide (IMODIUM) 2 MG capsule Take 1 capsule (2 mg total) by mouth as needed for diarrhea  or loose stools (upto 3 times a day). 10/14/21   Pokhrel, Corrie Mckusick, MD  Nutritional Supplements (NUTRITIONAL SUPPLEMENT PO) Take 120 mLs by mouth 3 (three) times daily.    [provider]  pantoprazole (PROTONIX) 40 MG tablet Take 40 mg by mouth daily.    [provider]  polyethylene glycol (MIRALAX / GLYCOLAX) 17 g packet Take 17 g by mouth daily as needed for moderate constipation. 04/18/22   Mariel Aloe, MD  QUEtiapine (SEROQUEL) 25 MG tablet Take 25 mg by mouth at bedtime.    [provider]  Vitamin D, Ergocalciferol, (DRISDOL) 1.25 MG (50000 UNIT) CAPS capsule Take 50,000 Units by mouth every Friday. 06/06/21   [provider]      Allergies    Alendronate    Review of Systems   Review of Systems Review of systems {pos/neg:18640::"Negative","Positive"} for ***.  A 10 point review of systems was performed and is negative unless otherwise reported in HPI.  Physical Exam Updated Vital Signs BP 116/66 (BP Location: Left Arm)   Pulse (!) 105   Temp 99 F (37.2 C) (Rectal)   Resp 20   SpO2 96%  Physical Exam General: Normal appearing {Desc; female/female:11659}, lying in bed.  HEENT: PERRLA, Sclera anicteric, MMM, trachea midline.  Cardiology: RRR, no murmurs/rubs/gallops. BL radial and DP pulses equal bilaterally.  Resp: Normal respiratory rate and effort. CTAB, no wheezes, rhonchi, crackles.  Abd: Soft, non-tender, non-distended. No  rebound tenderness or guarding.  GU: Deferred. MSK: No peripheral edema or signs of trauma. Extremities without deformity or TTP. No cyanosis or clubbing. Skin: warm, dry. No rashes or lesions. Back: No CVA tenderness Neuro: A&Ox4, CNs II-XII grossly intact. MAEs. Sensation grossly intact.  Psych: Normal mood and affect.   ED Results / Procedures / Treatments   Labs (all labs ordered are listed, but only abnormal results are displayed) Labs Reviewed - No data to display  EKG None  Radiology No results  found.  Procedures Procedures  {Document cardiac monitor, telemetry assessment procedure when appropriate:1}  Medications Ordered in ED Medications - No data to display  ED Course/ Medical Decision Making/ A&P                          Medical Decision Making Amount and/or Complexity of Data Reviewed Labs: ordered. Decision-making details documented in ED Course. Radiology: ordered. Decision-making details documented in ED Course.  Risk Prescription drug management. Decision regarding hospitalization.    This patient presents to the ED for concern of altered mental status, this involves an extensive number of treatment options, and is a complaint that carries with it a high risk of complications and morbidity.  I considered the following differential and admission for this acute, potentially life threatening condition.   MDM:    Ddx of acute altered mental status or encephalopathy considered but not limited to: -Intracranial abnormalities such as ICH, hydrocephalus, head trauma -Infection such as UTI, PNA - patient's facility  noted malodorous urine, will get UA -No toxic ingestion reported, patient  -Electrolyte abnormalities or hyper/hypoglycemia -Hypercarbia or hypoxia -Hepatic encephalopathy or uremia -ACS or arrhythmia -Endocrine abnormality such as thyroid storm or myxedema coma   Clinical Course as of 12/08/22 1806  Thu Nov 27, 2022  1202 Lactic Acid, Venous: 1.2 [HN]  1202 WBC(!): 15.3 +Leukocytosis w/ left shift [HN]  1202 Hemoglobin(!): 10.4 [HN]  1426 Total Bilirubin(!): 1.9 [HN]  1426 Alkaline Phosphatase(!): 146 [HN]  1426 ALT(!): 126 [HN]  1426 AST(!): 93 LFTs are mildly elevated.  Per the records she had mildly elevated LFTs in 2013 while she was in the hospital with sepsis. Workup that did not demonstrate anything and they improved on their own, had a history of cholecystectomy [HN]  1427 of transaminitis  [HN]  1427 WBC(!): 15.3 [HN]  1427  Leukocytosis [HN]  1427 Creatinine(!): 2.71 AKI on CKD [HN]  1427 Resp panel by RT-PCR (RSV, Flu A&B, Covid) Anterior Nasal Swab Negative [HN]  1428 BUN(!): 100 +Uremia [HN]  1428 Troponin I (High Sensitivity)(!): 42 [HN]  1428 CT Head Wo Contrast 1. No evidence of acute intracranial abnormality. 2. Chronic microvascular ischemic disease and remote bilateral basal ganglia infarcts.   [HN]  1429 DG Chest 1 View Bibasilar atelectasis without acute infiltrate. [HN]    Clinical Course User Index [HN] Audley Hose, MD    Labs: I Ordered, and personally interpreted labs.  The pertinent results include:  ***  Imaging Studies ordered: I ordered imaging studies including *** I independently visualized and interpreted imaging. I agree with the radiologist interpretation  Additional history obtained from ***.  External records from outside source obtained and reviewed including ***  Cardiac Monitoring: .The patient was maintained on a cardiac monitor.  I personally viewed and interpreted the cardiac monitored which showed an underlying rhythm of: ***  Reevaluation: After the interventions noted above, I reevaluated the patient and found that they have :{  resolved/improved/worsened:23923::"improved"}  Social Determinants of Health: .***  Disposition:  ***  Co morbidities that complicate the patient evaluation . Past Medical History:  Diagnosis Date  . Dementia ([place])   . Diabetes mellitus   . Hyperlipidemia   . Hypertension      Medicines No orders of the defined types were placed in this encounter.   I have reviewed the patients home medicines and have made adjustments as needed  Problem List / ED Course: Problem List Items Addressed This Visit   None        {Document critical care time when appropriate:1} {Document review of labs and clinical decision tools ie heart score, Chads2Vasc2 etc:1}  {Document your independent review of radiology images, and any  outside records:1} {Document your discussion with family members, caretakers, and with consultants:1} {Document social determinants of health affecting pt's care:1} {Document your decision making why or why not admission, treatments were needed:1}  This note was created using dictation software, which may contain spelling or grammatical errors.

## 2022-11-27 NOTE — H&P (Signed)
History and Physical    [name] [name] OBS:[id number] DOB: 06/15/1930 DOA: 11/27/2022  PCP: Pcp, No  Patient coming from: SNF  I have personally briefly reviewed patient's old medical records in [place]  Chief Complaint: Decreased unresponsiveness  HPI: [name] [name] is a 89 y.o. female with medical history significant of obesity, hypertension, hyperlipidemia, type 2 diabetes mellitus, GERD, constipation, carotid artery stenosis dementia and many other comorbidities was brought into the emergency department due to decreased responsiveness.  She lives at the nursing home and the staff noticed that she had reduced responsiveness/altered mental status today which is unusual for her.  Patient herself is unable to provide any history as she continues to talk without giving any break and her conversation is unrelated to the questions I asked.  Thus history was obtained from chart review and ED physician.  Reportedly, she was having altered mental status and staff noted strongly smelly urine so they were concerned about possible UTI.  When EMS arrived, her temperature was 102 F at the facility and she was tachycardic but normotensive.  She was brought into the emergency department here.  ED Course: Upon arrival here, she has had 1 episode of tachycardia and tachypnea but she is afebrile and otherwise hemodynamically stable.  Patient herself could not provide any history.  Further workup indicated that she met criteria for sepsis for possible UTI.  She also had AKI on CKD stage IV.  Zosyn is ordered for her, hospitalist were consulted for admission.  COVID-negative.  Review of Systems: As per HPI otherwise negative.    Past Medical History:  Diagnosis Date   Dementia ([place])    Diabetes mellitus    Hyperlipidemia    Hypertension     Past Surgical History:  Procedure Laterality Date   APPENDECTOMY     CHOLECYSTECTOMY     COLONOSCOPY  06/23/2012   Procedure: COLONOSCOPY;  Surgeon: Lear Ng, MD;  Location: [facility];  Service: Endoscopy;  Laterality: N/A;   ESOPHAGOGASTRODUODENOSCOPY  06/23/2012   Procedure: ESOPHAGOGASTRODUODENOSCOPY (EGD);  Surgeon: Lear Ng, MD;  Location[facility] END[facility]COPY;  Service: Endoscopy;  Laterality: N/A;     reports that she has never smoked. She has never used smokeless tobacco. She reports that she does not drink alcohol and does not use drugs.  Allergies  Allergen Reactions   Alendronate     Other reaction(s): Other (See Comments) Poor renal function.    Family History  Family history unknown: Yes    Prior to Admission medications   Medication Sig Start Date End Date Taking? Authorizing Provider  acetaminophen (TYLENOL) 325 MG tablet Take 2 tablets (650 mg total) by mouth every 6 (six) hours as needed for mild pain (or Fever >/= 101). 09/20/21  Yes Nolberto Hanlon, MD  ascorbic acid (VITAMIN C) 500 MG tablet Take 500 mg by mouth 2 (two) times daily.   Yes [provider]  atorvastatin (LIPITOR) 20 MG tablet Take 20 mg by mouth daily. 06/13/21  Yes [provider]  divalproex (DEPAKOTE SPRINKLE) 125 MG capsule Take 250 mg by mouth 3 (three) times daily.   Yes [provider]  ferrous sulfate 325 (65 FE) MG tablet Take 325 mg by mouth daily with breakfast.   Yes [provider]  haloperidol (HALDOL) 0.5 MG tablet Take 1 tablet (0.5 mg total) by mouth 2 (two) times daily as needed for agitation. 05/22/22  Yes Sreenath, Sudheer B, MD  loperamide (IMODIUM) 2 MG capsule Take 1 capsule (  2 mg total) by mouth as needed for diarrhea or loose stools (upto 3 times a day). 10/14/21  Yes Pokhrel, Laxman, MD  Multiple Vitamins-Minerals (MULTIVITAMIN WITH MINERALS) tablet Take 1 tablet by mouth daily.   Yes [provider]  nitrofurantoin, macrocrystal-monohydrate, (MACROBID) 100 MG capsule Take 100 mg by mouth daily. 11/18/22  Yes [provider]  pantoprazole (PROTONIX) 40 MG tablet Take 40 mg  by mouth daily.   Yes [provider]  polyethylene glycol (MIRALAX / GLYCOLAX) 17 g packet Take 17 g by mouth daily as needed for moderate constipation. 04/18/22  Yes Mariel Aloe, MD  QUEtiapine (SEROQUEL) 25 MG tablet Take 25 mg by mouth at bedtime.   Yes [provider]  Vitamin D, Ergocalciferol, (DRISDOL) 1.25 MG (50000 UNIT) CAPS capsule Take 50,000 Units by mouth every Friday. 06/06/21  Yes [provider]  Zinc Sulfate 220 (50 Zn) MG TABS Take 1 tablet by mouth daily.   Yes [provider]    Physical Exam: Vitals:   11/27/22 1300 11/27/22 1322 11/27/22 1330 11/27/22 1400  BP: 104/65  121/67 125/70  Pulse: 94  94 93  Resp: 20  (!) 21 18  Temp:  97.7 F (36.5 C)    TempSrc:  Oral    SpO2: 100%  99% 98%  Weight:      Height:        Constitutional: NAD, calm, comfortable Vitals:   11/27/22 1300 11/27/22 1322 11/27/22 1330 11/27/22 1400  BP: 104/65  121/67 125/70  Pulse: 94  94 93  Resp: 20  (!) 21 18  Temp:  97.7 F (36.5 C)    TempSrc:  Oral    SpO2: 100%  99% 98%  Weight:      Height:       Eyes: PERRL, lids and conjunctivae dry ENMT: Dry mucous membranes . Posterior pharynx clear of any exudate or lesions.Normal dentition.  Neck: normal, supple, no masses, no thyromegaly Respiratory: clear to auscultation bilaterally, no wheezing, no crackles. Normal respiratory effort. No accessory muscle use.  Cardiovascular: Regular rate and rhythm, no murmurs / rubs / gallops. No extremity edema. 2+ pedal pulses. No carotid bruits.  Abdomen: no tenderness, no masses palpated. No hepatosplenomegaly. Bowel sounds positive.  Musculoskeletal: no clubbing / cyanosis. No joint deformity upper and lower extremities. Good ROM, no contractures. Normal muscle tone.  Skin: no rashes, lesions, ulcers. No induration Neurologic: CN 2-12 grossly intact. Sensation intact, DTR normal. Strength 5/5 in all 4.  She is alert and oriented x 1 only.   Labs on  Admission: I have personally reviewed following labs and imaging studies  CBC: Recent Labs  Lab 11/27/22 1100  WBC 15.3*  NEUTROABS 11.1*  HGB 10.4*  HCT 34.3*  MCV 102.7*  PLT 370   Basic Metabolic Panel: Recent Labs  Lab 11/27/22 1100  NA 146*  K 4.6  CL 107  CO2 27  GLUCOSE 166*  BUN 100*  CREATININE 2.71*  CALCIUM 9.2  MG 2.8*   GFR: Estimated Creatinine Clearance: 16.9 mL/min (A) (by C-G formula based on SCr of 2.71 mg/dL (H)). Liver Function Tests: Recent Labs  Lab 11/27/22 1100  AST 93*  ALT 126*  ALKPHOS 146*  BILITOT 1.9*  PROT 8.4*  ALBUMIN 2.9*   No results for input(s): "LIPASE", "AMYLASE" in the last 168 hours. No results for input(s): "AMMONIA" in the last 168 hours. Coagulation Profile: No results for input(s): "INR", "PROTIME" in the last 168 hours. Cardiac  Enzymes: No results for input(s): "CKTOTAL", "CKMB", "CKMBINDEX", "TROPONINI" in the last 168 hours. BNP (last 3 results) No results for input(s): "PROBNP" in the last 8760 hours. HbA1C: No results for input(s): "HGBA1C" in the last 72 hours. CBG: No results for input(s): "GLUCAP" in the last 168 hours. Lipid Profile: No results for input(s): "CHOL", "HDL", "LDLCALC", "TRIG", "CHOLHDL", "LDLDIRECT" in the last 72 hours. Thyroid Function Tests: No results for input(s): "TSH", "T4TOTAL", "FREET4", "T3FREE", "THYROIDAB" in the last 72 hours. Anemia Panel: No results for input(s): "VITAMINB12", "FOLATE", "FERRITIN", "TIBC", "IRON", "RETICCTPCT" in the last 72 hours. Urine analysis:    Component Value Date/Time   COLORURINE YELLOW 11/27/2022 1100   APPEARANCEUR CLOUDY (A) 11/27/2022 1100   LABSPEC 1.010 11/27/2022 1100   PHURINE 8.0 11/27/2022 1100   GLUCOSEU NEGATIVE 11/27/2022 1100   HGBUR NEGATIVE 11/27/2022 1100   BILIRUBINUR SMALL (A) 11/27/2022 1100   KETONESUR NEGATIVE 11/27/2022 1100   PROTEINUR 100 (A) 11/27/2022 1100   UROBILINOGEN 1.0 12/30/2014 1333   NITRITE NEGATIVE  11/27/2022 1100   LEUKOCYTESUR MODERATE (A) 11/27/2022 1100    Radiological Exams on Admission: CT Head Wo Contrast  Result Date: 11/27/2022 CLINICAL DATA:  Neuro deficit, acute, stroke suspected Mental status change, unknown cause EXAM: CT HEAD WITHOUT CONTRAST TECHNIQUE: Contiguous axial images were obtained from the base of the skull through the vertex without intravenous contrast. RADIATION DOSE REDUCTION: This exam was performed according to the departmental dose-optimization program which includes automated exposure control, adjustment of the mA and/or kV according to patient size and/or use of iterative reconstruction technique. COMPARISON:  CT head Apr 05, 2022. FINDINGS: Brain: Remote bilateral basal ganglia lacunar infarcts. No evidence of acute large vascular territory infarct. Additional patchy white matter hypodensities, nonspecific but compatible with chronic microvascular ischemic disease. Cerebral atrophy. No evidence of acute hemorrhage, mass lesion or midline shift. Vascular: No hyperdense vessel identified. Skull: No acute fracture. Sinuses/Orbits: Remote bilateral medial orbital wall fractures. No acute findings. Other: No mastoid effusions. IMPRESSION: 1. No evidence of acute intracranial abnormality. 2. Chronic microvascular ischemic disease and remote bilateral basal ganglia infarcts. Electronically Signed   By: Margaretha Sheffield M.D.   On: 11/27/2022 13:09   DG Chest 1 View  Result Date: 11/27/2022 CLINICAL DATA:  Altered mental status EXAM: CHEST  1 VIEW COMPARISON:  05/15/2022 FINDINGS: Upper normal heart size. Mediastinal contours and pulmonary vascularity normal. Atherosclerotic calcification aorta. Decreased lung volumes with bibasilar atelectasis. No definite infiltrate, pleural effusion, or pneumothorax. Osseous structures unremarkable. IMPRESSION: Bibasilar atelectasis without acute infiltrate. Aortic Atherosclerosis (ICD10-I70.0). Electronically Signed   By: Lavonia Dana  M.D.   On: 11/27/2022 13:07    EKG: Independently reviewed.  Atrial tachycardia.  Assessment/Plan Principal Problem:   Acute metabolic encephalopathy Active Problems:   Diabetes mellitus type 2, diet-controlled (HCC)   Acute lower UTI   Sepsis ([place])   HTN (hypertension)   Acute renal failure superimposed on stage 4 chronic kidney disease (HCC)   Hyperglycemia due to type 2 diabetes mellitus (HCC)   Dementia with anxiety ([place])   Sepsis secondary to UTI, POA: Patient met criteria for sepsis based on leukocytosis, tachycardia and tachypnea, UA actually equivocal, some signs of infection, no other source of infection thus UTI is likely source of infection in her.  I will start her on Rocephin, follow urine and blood culture.  Acute toxic encephalopathy: Likely in the setting of sepsis.  Treat underlying cause.  Diet-controlled type 2 diabetes mellitus: She is not on any medications,  will start on SSI.  AKI on CKD stage IV: Baseline creatinine appears to be around 1.8-2.0, presented with creatinine of 2.7.  Will start on gentle hydration, repeat labs in the morning.  This is in the setting of sepsis.  Hyperlipidemia: Resume atorvastatin.  GERD: Continue PPI.  Dementia with anxiety/psychosis: Resume Depakote, quetiapine and Haldol.  DVT prophylaxis: heparin injection 5,000 Units Start: 11/27/22 1500 Code Status: DNR, verified from previous records and also verified with patient's granddaughter over the phone. Family Communication: None present at bedside.  Plan of care discussed with granddaughter at over the phone.  I was unable to reach the daughter. Disposition Plan: Discharge back to SNF when medically stable, in 2 to 3 days. Consults called: None  Darliss Cheney MD [facility]  *Please note that this is a verbal dictation therefore any spelling or grammatical errors are due to the "[employer]" system interpretation.  Please page via [place] and do not message via  secure chat for urgent patient care matters. Secure chat can be used for non urgent patient care matters. 11/27/2022, 2:49 PM  To contact the attending provider between 7A-7P or the covering provider during after hours 7P-7A, please log into the web site [email or web address]

## 2022-11-27 NOTE — Progress Notes (Signed)
This CSW received a call from [facility] with [facility] stating they were asked to follow this pt by Donella Stade, DON, at [facility].   This CSW noted that there is no TOC consult and that there is no Palliative order. TOC will present choice if/when consult and orders are entered.

## 2022-11-27 NOTE — ED Triage Notes (Signed)
Pt brought in by [facility] for c/o ams and possible sepsis  Staff reported to ems staff that pt is usually alert but pt is not responding to staff  Temp with ems 100 and systolic pressure of 712  Staff reported to ems strong smell of urine and possible UTI

## 2022-11-28 ENCOUNTER — Inpatient Hospital Stay (HOSPITAL_COMMUNITY): Payer: Medicare Other

## 2022-11-28 DIAGNOSIS — G9341 Metabolic encephalopathy: Secondary | ICD-10-CM | POA: Diagnosis not present

## 2022-11-28 LAB — COMPREHENSIVE METABOLIC PANEL
ALT: 93 U/L — ABNORMAL HIGH (ref 0–44)
AST: 47 U/L — ABNORMAL HIGH (ref 15–41)
Albumin: 2.6 g/dL — ABNORMAL LOW (ref 3.5–5.0)
Alkaline Phosphatase: 117 U/L (ref 38–126)
Anion gap: 10 (ref 5–15)
BUN: 100 mg/dL — ABNORMAL HIGH (ref 8–23)
CO2: 27 mmol/L (ref 22–32)
Calcium: 9.1 mg/dL (ref 8.9–10.3)
Chloride: 112 mmol/L — ABNORMAL HIGH (ref 98–111)
Creatinine, Ser: 2.1 mg/dL — ABNORMAL HIGH (ref 0.44–1.00)
GFR, Estimated: 22 mL/min — ABNORMAL LOW (ref >60)
Glucose, Bld: 146 mg/dL — ABNORMAL HIGH (ref 70–99)
Potassium: 4.7 mmol/L (ref 3.5–5.1)
Sodium: 149 mmol/L — ABNORMAL HIGH (ref 135–145)
Total Bilirubin: 0.6 mg/dL (ref 0.3–1.2)
Total Protein: 7.6 g/dL (ref 6.5–8.1)

## 2022-11-28 LAB — CBC
HCT: 33.3 % — ABNORMAL LOW (ref 36.0–46.0)
Hemoglobin: 9.8 g/dL — ABNORMAL LOW (ref 12.0–15.0)
MCH: 31 pg (ref 26.0–34.0)
MCHC: 29.4 g/dL — ABNORMAL LOW (ref 30.0–36.0)
MCV: 105.4 fL — ABNORMAL HIGH (ref 80.0–100.0)
Platelets: 281 10*3/uL (ref 150–400)
RBC: 3.16 MIL/uL — ABNORMAL LOW (ref 3.87–5.11)
RDW: 13.3 % (ref 11.5–15.5)
WBC: 16 10*3/uL — ABNORMAL HIGH (ref 4.0–10.5)
nRBC: 0 % (ref 0.0–0.2)

## 2022-11-28 LAB — URINE CULTURE

## 2022-11-28 LAB — CORTISOL-AM, BLOOD: Cortisol - AM: 22.3 ug/dL (ref 6.7–22.6)

## 2022-11-28 LAB — SODIUM
Sodium: 148 mmol/L — ABNORMAL HIGH (ref 135–145)
Sodium: 149 mmol/L — ABNORMAL HIGH (ref 135–145)

## 2022-11-28 LAB — PROTIME-INR
INR: 1.3 — ABNORMAL HIGH (ref 0.8–1.2)
Prothrombin Time: 16.2 seconds — ABNORMAL HIGH (ref 11.4–15.2)

## 2022-11-28 LAB — PROCALCITONIN: Procalcitonin: 1.26 ng/mL

## 2022-11-28 MED ORDER — POLYVINYL ALCOHOL 1.4 % OP SOLN: 1.0000 [drp] | OPHTHALMIC | Status: DC | PRN

## 2022-11-28 NOTE — Progress Notes (Signed)
PROGRESS NOTE    [name] [name]  STM:[id number] DOB: [birth year] DOA: 11/27/2022 PCP: Pcp, No   Brief Narrative:  HPI: [name] [name] is a 89 y.o. female with medical history significant of obesity, hypertension, hyperlipidemia, type 2 diabetes mellitus, GERD, constipation, carotid artery stenosis dementia and many other comorbidities was brought into the emergency department due to decreased responsiveness.  She lives at the nursing home and the staff noticed that she had reduced responsiveness/altered mental status today which is unusual for her.  Patient herself is unable to provide any history as she continues to talk without giving any break and her conversation is unrelated to the questions I asked.  Thus history was obtained from chart review and ED physician.  Reportedly, she was having altered mental status and staff noted strongly smelly urine so they were concerned about possible UTI.  When EMS arrived, her temperature was 102 F at the facility and she was tachycardic but normotensive.  She was brought into the emergency department here.   ED Course: Upon arrival here, she has had 1 episode of tachycardia and tachypnea but she is afebrile and otherwise hemodynamically stable.  Patient herself could not provide any history.  Further workup indicated that she met criteria for sepsis for possible UTI.  She also had AKI on CKD stage IV.  Zosyn is ordered for her, hospitalist were consulted for admission.  COVID-negative.  Assessment & Plan:   Principal Problem:   Acute metabolic encephalopathy Active Problems:   Diabetes mellitus type 2, diet-controlled (HCC)   Acute lower UTI   Sepsis ([place])   HTN (hypertension)   Acute renal failure superimposed on stage 4 chronic kidney disease (HCC)   Hyperglycemia due to type 2 diabetes mellitus (HCC)   Dementia with anxiety ([place])  Sepsis secondary to UTI, POA: Patient met criteria for sepsis based on leukocytosis, tachycardia and tachypnea, UA actually  equivocal, some signs of infection, no other source of infection thus UTI is likely source of infection in her.  Continue Rocephin and follow culture.   Acute toxic encephalopathy: Likely in the setting of sepsis.  Treat underlying cause.  However today I noticed that she has left facial droop and patient is generally weak, no focal deficit.  I reviewed her chart extensively, she does not carry any history of stroke in the past.  Thus I will proceed with MRI brain.   Diet-controlled type 2 diabetes mellitus: She is not on any medications, continue SSI.   AKI on CKD stage IV: Baseline creatinine appears to be around 1.8-2.0, presented with creatinine of 2.7.  IV fluids initiated, creatinine improved to 2.1 today.  Will continue IV fluids again because I am unsure of how much of p.o. intake she is going to take with her altered mental status.   Hyperlipidemia: Continue atorvastatin.   GERD: Continue PPI.   Dementia with anxiety/psychosis: Continue Depakote, quetiapine and Haldol.  DVT prophylaxis: heparin injection 5,000 Units Start: 11/27/22 1500   Code Status: DNR  Family Communication:  None present at bedside.  Discussed with her granddaughter yesterday.  I tried calling her today and left a voicemail.  Status is: Inpatient Remains inpatient appropriate because: Patient is still encephalopathy.  Stroke workup in progress.   Estimated body mass index is 30.93 kg/m as calculated from the following:   Height as of this encounter: 5\' 9"  (1.753 m).   Weight as of this encounter: 95 kg.    Nutritional Assessment: Body mass index is 30.93 kg/m.Mar[place] Kitchen Seen by  dietician.  I agree with the assessment and plan as outlined below: Nutrition Status:        . Skin Assessment: I have examined the patient's skin and I agree with the wound assessment as performed by the wound care RN as outlined below:    Consultants:  None  Procedures:  None  Antimicrobials:  Anti-infectives (From  admission, onward)    Start     Dose/Rate Route Frequency Ordered Stop   11/27/22 2200  cefTRIAXone (ROCEPHIN) 2 g in sodium chloride 0.9 % 100 mL IVPB        2 g 200 mL/hr over 30 Minutes Intravenous Every 24 hours 11/27/22 1448 12/04/22 2159   11/27/22 1445  piperacillin-tazobactam (ZOSYN) IVPB 3.375 g        3.375 g 100 mL/hr over 30 Minutes Intravenous  Once 11/27/22 1438 11/27/22 1518         Subjective: Patient seen and examined, she is more alert compared to yesterday and appears to be oriented to self but disoriented to rest of the questions.  She is following commands.  She is globally weak.  Objective: Vitals:   11/27/22 1400 11/27/22 1601 11/27/22 2233 11/28/22 0443  BP: 125/70 124/70 121/63 120/65  Pulse: 93 87 81 72  Resp: 18 20 16 18   Temp:   (!) 97.2 F (36.2 C) (!) 96.6 F (35.9 C)  TempSrc:      SpO2: 98% 100% 100% 100%  Weight:      Height:        Intake/Output Summary (Last 24 hours) at 11/28/2022 0949 Last data filed at 11/28/2022 0630 Gross per 24 hour  Intake 919.21 ml  Output 413 ml  Net 506.21 ml   Filed Weights   11/27/22 1109  Weight: 95 kg    Examination:  General exam: Appears calm and comfortable  Respiratory system: Clear to auscultation. Respiratory effort normal. Cardiovascular system: S1 & S2 heard, RRR. No JVD, murmurs, rubs, gallops or clicks. No pedal edema. Gastrointestinal system: Abdomen is nondistended, soft and nontender. No organomegaly or masses felt. Normal bowel sounds heard. Central nervous system: Alert and oriented to self.  No focal deficit, globally weak.   Data Reviewed: I have personally reviewed following labs and imaging studies  CBC: Recent Labs  Lab 11/27/22 1100 11/28/22 0407  WBC 15.3* 16.0*  NEUTROABS 11.1*  --   HGB 10.4* 9.8*  HCT 34.3* 33.3*  MCV 102.7* 105.4*  PLT 325 465   Basic Metabolic Panel: Recent Labs  Lab 11/27/22 1100 11/27/22 1447 11/28/22 0407  NA 146*  --  149*  K 4.6  --   4.7  CL 107  --  112*  CO2 27  --  27  GLUCOSE 166*  --  146*  BUN 100*  --  100*  CREATININE 2.71*  --  2.10*  CALCIUM 9.2  --  9.1  MG 2.8* 2.8*  --    GFR: Estimated Creatinine Clearance: 21.8 mL/min (A) (by C-G formula based on SCr of 2.1 mg/dL (H)). Liver Function Tests: Recent Labs  Lab 11/27/22 1100 11/28/22 0407  AST 93* 47*  ALT 126* 93*  ALKPHOS 146* 117  BILITOT 1.9* 0.6  PROT 8.4* 7.6  ALBUMIN 2.9* 2.6*   No results for input(s): "LIPASE", "AMYLASE" in the last 168 hours. No results for input(s): "AMMONIA" in the last 168 hours. Coagulation Profile: Recent Labs  Lab 11/28/22 0407  INR 1.3*   Cardiac Enzymes: No results for input(s): "CKTOTAL", "CKMB", "  CKMBINDEX", "TROPONINI" in the last 168 hours. BNP (last 3 results) No results for input(s): "PROBNP" in the last 8760 hours. HbA1C: No results for input(s): "HGBA1C" in the last 72 hours. CBG: Recent Labs  Lab 11/27/22 1711  GLUCAP 154*   Lipid Profile: No results for input(s): "CHOL", "HDL", "LDLCALC", "TRIG", "CHOLHDL", "LDLDIRECT" in the last 72 hours. Thyroid Function Tests: No results for input(s): "TSH", "T4TOTAL", "FREET4", "T3FREE", "THYROIDAB" in the last 72 hours. Anemia Panel: No results for input(s): "VITAMINB12", "FOLATE", "FERRITIN", "TIBC", "IRON", "RETICCTPCT" in the last 72 hours. Sepsis Labs: Recent Labs  Lab 11/27/22 1100 11/27/22 1319 11/28/22 0407  PROCALCITON  --   --  1.26  LATICACIDVEN 1.2 1.2  --     Recent Results (from the past 240 hour(s))  Blood culture (routine x 2)     Status: None (Preliminary result)   Collection Time: 11/27/22 11:00 AM   Specimen: BLOOD  Result Value Ref Range Status   Specimen Description BLOOD RIGHT ANTECUBITAL  Final   Special Requests   Final    BOTTLES DRAWN AEROBIC AND ANAEROBIC Blood Culture results may not be optimal due to an inadequate volume of blood received in culture bottles   Culture   Final    NO GROWTH < 24  HOURS Performed at [facility], [address], [place], [place] [address]    Report Status PENDING  Incomplete  Resp panel by RT-PCR (RSV, Flu A&B, Covid) Anterior Nasal Swab     Status: None   Collection Time: 11/27/22 11:36 AM   Specimen: Anterior Nasal Swab  Result Value Ref Range Status   SARS Coronavirus 2 by RT PCR NEGATIVE NEGATIVE Final    Comment: (NOTE) SARS-CoV-2 target nucleic acids are NOT DETECTED.  The SARS-CoV-2 RNA is generally detectable in upper respiratory specimens during the acute phase of infection. The lowest concentration of SARS-CoV-2 viral copies this assay can detect is 138 copies/mL. A negative result does not preclude SARS-Cov-2 infection and should not be used as the sole basis for treatment or other patient management decisions. A negative result may occur with  improper specimen collection/handling, submission of specimen other than nasopharyngeal swab, presence of viral mutation(s) within the areas targeted by this assay, and inadequate number of viral copies(<138 copies/mL). A negative result must be combined with clinical observations, patient history, and epidemiological information. The expected result is Negative.  Fact Sheet for Patients:  EntrepreneurPulse.com.au  Fact Sheet for Healthcare Providers:  IncredibleEmployment.be  This test is no t yet approved or cleared by the [place] FDA and  has been authorized for detection and/or diagnosis of SARS-CoV-2 by FDA under an Emergency Use Authorization (EUA). This EUA will remain  in effect (meaning this test can be used) for the duration of the COVID-19 declaration under Section 564(b)(1) of the Act, 21 U.S.C.section 360bbb-3(b)(1), unless the authorization is terminated  or revoked sooner.       Influenza A by PCR NEGATIVE NEGATIVE Final   Influenza B by PCR NEGATIVE NEGATIVE Final    Comment: (NOTE) The Xpert Xpress SARS-CoV-2/FLU/RSV plus  assay is intended as an aid in the diagnosis of influenza from Nasopharyngeal swab specimens and should not be used as a sole basis for treatment. Nasal washings and aspirates are unacceptable for Xpert Xpress SARS-CoV-2/FLU/RSV testing.  Fact Sheet for Patients: EntrepreneurPulse.com.au  Fact Sheet for Healthcare Providers: IncredibleEmployment.be  This test is not yet approved or cleared by the [place] FDA and has been authorized for detection and/or  diagnosis of SARS-CoV-2 by FDA under an Emergency Use Authorization (EUA). This EUA will remain in effect (meaning this test can be used) for the duration of the COVID-19 declaration under Section 564(b)(1) of the Act, 21 U.S.C. section 360bbb-3(b)(1), unless the authorization is terminated or revoked.     Resp Syncytial Virus by PCR NEGATIVE NEGATIVE Final    Comment: (NOTE) Fact Sheet for Patients: EntrepreneurPulse.com.au  Fact Sheet for Healthcare Providers: IncredibleEmployment.be  This test is not yet approved or cleared by the [place] FDA and has been authorized for detection and/or diagnosis of SARS-CoV-2 by FDA under an Emergency Use Authorization (EUA). This EUA will remain in effect (meaning this test can be used) for the duration of the COVID-19 declaration under Section 564(b)(1) of the Act, 21 U.S.C. section 360bbb-3(b)(1), unless the authorization is terminated or revoked.  Performed at [facility], [address] [place], [place] [address]   Blood culture (routine x 2)     Status: None (Preliminary result)   Collection Time: 11/27/22  3:22 PM   Specimen: BLOOD  Result Value Ref Range Status   Specimen Description BLOOD BLOOD RIGHT HAND  Final   Special Requests   Final    BOTTLES DRAWN AEROBIC AND ANAEROBIC Blood Culture adequate volume   Culture   Final    NO GROWTH < 24 HOURS Performed at [facility], [address], [place], [place] [address]    Report Status PENDING  Incomplete     Radiology Studies: CT Head Wo Contrast  Result Date: 11/27/2022 CLINICAL DATA:  Neuro deficit, acute, stroke suspected Mental status change, unknown cause EXAM: CT HEAD WITHOUT CONTRAST TECHNIQUE: Contiguous axial images were obtained from the base of the skull through the vertex without intravenous contrast. RADIATION DOSE REDUCTION: This exam was performed according to the departmental dose-optimization program which includes automated exposure control, adjustment of the mA and/or kV according to patient size and/or use of iterative reconstruction technique. COMPARISON:  CT head Apr 05, 2022. FINDINGS: Brain: Remote bilateral basal ganglia lacunar infarcts. No evidence of acute large vascular territory infarct. Additional patchy white matter hypodensities, nonspecific but compatible with chronic microvascular ischemic disease. Cerebral atrophy. No evidence of acute hemorrhage, mass lesion or midline shift. Vascular: No hyperdense vessel identified. Skull: No acute fracture. Sinuses/Orbits: Remote bilateral medial orbital wall fractures. No acute findings. Other: No mastoid effusions. IMPRESSION: 1. No evidence of acute intracranial abnormality. 2. Chronic microvascular ischemic disease and remote bilateral basal ganglia infarcts. Electronically Signed   By: Margaretha Sheffield M.D.   On: 11/27/2022 13:09   DG Chest 1 View  Result Date: 11/27/2022 CLINICAL DATA:  Altered mental status EXAM: CHEST  1 VIEW COMPARISON:  05/15/2022 FINDINGS: Upper normal heart size. Mediastinal contours and pulmonary vascularity normal. Atherosclerotic calcification aorta. Decreased lung volumes with bibasilar atelectasis. No definite infiltrate, pleural effusion, or pneumothorax. Osseous structures unremarkable. IMPRESSION: Bibasilar atelectasis without acute infiltrate. Aortic Atherosclerosis (ICD10-I70.0). Electronically Signed   By: Lavonia Dana M.D.   On:  11/27/2022 13:07    Scheduled Meds:  heparin  5,000 Units Subcutaneous Q8H   Continuous Infusions:  cefTRIAXone (ROCEPHIN)  IV Stopped (11/27/22 2213)   lactated ringers 100 mL/hr at 11/28/22 0437     LOS: 1 day   Darliss Cheney, MD [facility]  11/28/2022, 9:49 AM   *Please note that this is a verbal dictation therefore any spelling or grammatical errors are due to the "[place]" system interpretation.  Please page via [place] and  do not message via secure chat for urgent patient care matters. Secure chat can be used for non urgent patient care matters.  How to contact the [facility] Attending or Consulting provider [place] or covering provider during after hours [place], for this patient?  Check the care team in [facility] and look for a) attending/consulting TRH provider listed and b) the [facility] team listed. Page or secure chat [phone]. Log into [email or web address] and use [place]'s universal password to access. If you do not have the password, please contact the hospital operator. Locate the [facility] provider you are looking for under [facility] and page to a number that you can be directly reached. If you still have difficulty reaching the provider, please page the [facility] (Director on Call) for the Hospitalists listed on amion for assistance.

## 2022-11-28 NOTE — Progress Notes (Addendum)
Patient alert and oriented only to self, rested well throughout the night.

## 2022-11-28 NOTE — Care Management Important Message (Signed)
Important Message  Patient Details  Name: [name] [name] MRN: [id number] Date of Birth: 07/03/1930   Medicare Important Message Given:  N/A - LOS <3 / Initial given by admissions     Tommy Medal 11/28/2022, 4:20 PM
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
## 2022-11-29 DIAGNOSIS — G9341 Metabolic encephalopathy: Secondary | ICD-10-CM | POA: Diagnosis not present

## 2022-11-29 LAB — BASIC METABOLIC PANEL
Anion gap: 10 (ref 5–15)
BUN: 87 mg/dL — ABNORMAL HIGH (ref 8–23)
CO2: 27 mmol/L (ref 22–32)
Calcium: 9.6 mg/dL (ref 8.9–10.3)
Chloride: 115 mmol/L — ABNORMAL HIGH (ref 98–111)
Creatinine, Ser: 1.61 mg/dL — ABNORMAL HIGH (ref 0.44–1.00)
GFR, Estimated: 30 mL/min — ABNORMAL LOW (ref >60)
Glucose, Bld: 132 mg/dL — ABNORMAL HIGH (ref 70–99)
Potassium: 4.8 mmol/L (ref 3.5–5.1)
Sodium: 152 mmol/L — ABNORMAL HIGH (ref 135–145)

## 2022-11-29 MED ORDER — DIVALPROEX SODIUM 125 MG PO CSDR
250.0000 mg | DELAYED_RELEASE_CAPSULE | Freq: Three times a day (TID) | ORAL | Status: DC
  Administered 2022-11-29 – 2022-12-01 (×8): 250 mg via ORAL
  Filled 2022-11-29 (×9): qty 2

## 2022-11-29 MED ORDER — FERROUS SULFATE 325 (65 FE) MG PO TABS
325.0000 mg | ORAL_TABLET | Freq: Every day | ORAL | Status: DC
  Administered 2022-11-30 – 2022-12-04 (×4): 325 mg via ORAL
  Filled 2022-11-29 (×5): qty 1

## 2022-11-29 MED ORDER — POLYETHYLENE GLYCOL 3350 17 G PO PACK: 17.0000 g | PACK | Freq: Every day | ORAL | Status: DC | PRN

## 2022-11-29 MED ORDER — VITAMIN C 500 MG PO TABS
500.0000 mg | ORAL_TABLET | Freq: Two times a day (BID) | ORAL | Status: DC
  Administered 2022-11-29 – 2022-12-04 (×9): 500 mg via ORAL
  Filled 2022-11-29 (×10): qty 1

## 2022-11-29 MED ORDER — HALOPERIDOL 0.5 MG PO TABS: 0.5000 mg | ORAL_TABLET | Freq: Two times a day (BID) | ORAL | Status: DC | PRN

## 2022-11-29 MED ORDER — ZINC SULFATE 220 (50 ZN) MG PO CAPS
220.0000 mg | ORAL_CAPSULE | Freq: Every day | ORAL | Status: DC
  Administered 2022-11-29 – 2022-12-04 (×5): 220 mg via ORAL
  Filled 2022-11-29 (×6): qty 1

## 2022-11-29 MED ORDER — PANTOPRAZOLE SODIUM 40 MG PO TBEC
40.0000 mg | DELAYED_RELEASE_TABLET | Freq: Every day | ORAL | Status: DC
  Administered 2022-11-29 – 2022-12-03 (×4): 40 mg via ORAL
  Filled 2022-11-29 (×6): qty 1

## 2022-11-29 MED ORDER — ATORVASTATIN CALCIUM 20 MG PO TABS
20.0000 mg | ORAL_TABLET | Freq: Every day | ORAL | Status: DC
  Administered 2022-11-29 – 2022-12-04 (×5): 20 mg via ORAL
  Filled 2022-11-29 (×6): qty 1

## 2022-11-29 MED ORDER — QUETIAPINE FUMARATE 25 MG PO TABS
25.0000 mg | ORAL_TABLET | Freq: Every day | ORAL | Status: DC
  Administered 2022-11-29 – 2022-12-03 (×4): 25 mg via ORAL
  Filled 2022-11-29 (×4): qty 1

## 2022-11-29 MED ORDER — ACETAMINOPHEN 325 MG PO TABS
650.0000 mg | ORAL_TABLET | Freq: Four times a day (QID) | ORAL | Status: DC | PRN
  Administered 2022-11-29: 650 mg via ORAL
  Filled 2022-11-29: qty 2

## 2022-11-29 NOTE — Progress Notes (Signed)
Patient alert and oriented to self. Patient rested well throughout the night.

## 2022-11-29 NOTE — Progress Notes (Signed)
PROGRESS NOTE    [name] [name]  PVX:[id number] DOB: 1929-12-23 DOA: 11/27/2022 PCP: Pcp, No   Brief Narrative:  HPI: [name] [name] is a 89 y.o. female with medical history significant of obesity, hypertension, hyperlipidemia, type 2 diabetes mellitus, GERD, constipation, carotid artery stenosis dementia and many other comorbidities was brought into the emergency department due to decreased responsiveness.  She lives at the nursing home and the staff noticed that she had reduced responsiveness/altered mental status today which is unusual for her.  Patient herself is unable to provide any history as she continues to talk without giving any break and her conversation is unrelated to the questions I asked.  Thus history was obtained from chart review and ED physician.  Reportedly, she was having altered mental status and staff noted strongly smelly urine so they were concerned about possible UTI.  When EMS arrived, her temperature was 102 F at the facility and she was tachycardic but normotensive.  She was brought into the emergency department here.   ED Course: Upon arrival here, she has had 1 episode of tachycardia and tachypnea but she is afebrile and otherwise hemodynamically stable.  Patient herself could not provide any history.  Further workup indicated that she met criteria for sepsis for possible UTI.  She also had AKI on CKD stage IV.  Zosyn is ordered for her, hospitalist were consulted for admission.  COVID-negative.  Assessment & Plan:   Principal Problem:   Acute metabolic encephalopathy Active Problems:   Diabetes mellitus type 2, diet-controlled (HCC)   Acute lower UTI   Sepsis ([place])   HTN (hypertension)   Acute renal failure superimposed on stage 4 chronic kidney disease (HCC)   Hyperglycemia due to type 2 diabetes mellitus (HCC)   Dementia with anxiety ([place])  Sepsis secondary to UTI, POA: Patient met criteria for sepsis based on leukocytosis, tachycardia and tachypnea, UA actually  equivocal, some signs of infection, no other source of infection thus UTI is likely source of infection in her.  Continue Rocephin, urine culture grew multiple species, blood culture negative.   Acute toxic encephalopathy: Patient's mental status has remained same since admission where she is sleepy but arousable and alert at times but her speech is very incomprehensible.  I am unable to verify her baseline.  I have tried to reach her daughter and was finally able to leave voicemail today.  Due to left facial droop, there was some suspicion of possible stroke so MRI was done which was unremarkable.   Diet-controlled type 2 diabetes mellitus: She is not on any medications, recent hemoglobin A1c was 5.7.  No need for SSI.   AKI on CKD stage IV: Baseline creatinine appears to be around 1.8-2.0, presented with creatinine of 2.7.  IV fluids initiated, creatinine improved to 1.6 today.  Will now DC IV fluids.   Hyperlipidemia: Continue atorvastatin.   GERD: Continue PPI.   Dementia with anxiety/psychosis: Continue Depakote, quetiapine and Haldol.  DVT prophylaxis: heparin injection 5,000 Units Start: 11/27/22 1500   Code Status: DNR  Family Communication:  None present at bedside.  Left voicemail for the daughter.  Status is: Inpatient Remains inpatient appropriate because: Patient is still encephalopathic, baseline unknown.   Estimated body mass index is 30.93 kg/m as calculated from the following:   Height as of this encounter: 5\' 9"  (1.753 m).   Weight as of this encounter: 95 kg.    Nutritional Assessment: Body mass index is 30.93 kg/m.Marland Kitchen Seen by dietician.  I agree with the  assessment and plan as outlined below: Nutrition Status:        . Skin Assessment: I have examined the patient's skin and I agree with the wound assessment as performed by the wound care RN as outlined below:    Consultants:  None  Procedures:  None  Antimicrobials:  Anti-infectives (From admission,  onward)    Start     Dose/Rate Route Frequency Ordered Stop   11/27/22 2200  cefTRIAXone (ROCEPHIN) 2 g in sodium chloride 0.9 % 100 mL IVPB        2 g 200 mL/hr over 30 Minutes Intravenous Every 24 hours 11/27/22 1448 12/04/22 2159   11/27/22 1445  piperacillin-tazobactam (ZOSYN) IVPB 3.375 g        3.375 g 100 mL/hr over 30 Minutes Intravenous  Once 11/27/22 1438 11/27/22 1518         Subjective:  Patient seen and examined.  She was sleepy but arousable.  She had no complaints.  Speech incomprehensible.  Objective: Vitals:   11/28/22 1213 11/28/22 2008 11/28/22 2221 11/29/22 0428  BP: 137/69 123/64 (!) 128/59 (!) 136/55  Pulse: 68 65 70 73  Resp: 14 20  17   Temp: (!) 97.4 F (36.3 C) 97.6 F (36.4 C)  (!) 97.4 F (36.3 C)  TempSrc: Axillary     SpO2: 98% 100% 100% 100%  Weight:      Height:        Intake/Output Summary (Last 24 hours) at 11/29/2022 1157 Last data filed at 11/29/2022 0951 Gross per 24 hour  Intake 100 ml  Output 500 ml  Net -400 ml    Filed Weights   11/27/22 1109  Weight: 95 kg    Examination:  General exam: Appears calm and comfortable  Respiratory system: Clear to auscultation. Respiratory effort normal. Cardiovascular system: S1 & S2 heard, RRR. No JVD, murmurs, rubs, gallops or clicks. No pedal edema. Gastrointestinal system: Abdomen is nondistended, soft and nontender. No organomegaly or masses felt. Normal bowel sounds heard. Central nervous system: Alert and oriented to self.  No focal deficit, globally weak.   Data Reviewed: I have personally reviewed following labs and imaging studies  CBC: Recent Labs  Lab 11/27/22 1100 11/28/22 0407  WBC 15.3* 16.0*  NEUTROABS 11.1*  --   HGB 10.4* 9.8*  HCT 34.3* 33.3*  MCV 102.7* 105.4*  PLT 325 017    Basic Metabolic Panel: Recent Labs  Lab 11/27/22 1100 11/27/22 1447 11/28/22 0407 11/28/22 1428 11/28/22 1711 11/29/22 0812  NA 146*  --  149* 148* 149* 152*  K 4.6  --  4.7   --   --  4.8  CL 107  --  112*  --   --  115*  CO2 27  --  27  --   --  27  GLUCOSE 166*  --  146*  --   --  132*  BUN 100*  --  100*  --   --  87*  CREATININE 2.71*  --  2.10*  --   --  1.61*  CALCIUM 9.2  --  9.1  --   --  9.6  MG 2.8* 2.8*  --   --   --   --     GFR: Estimated Creatinine Clearance: 28.5 mL/min (A) (by C-G formula based on SCr of 1.61 mg/dL (H)). Liver Function Tests: Recent Labs  Lab 11/27/22 1100 11/28/22 0407  AST 93* 47*  ALT 126* 93*  ALKPHOS 146* 117  BILITOT 1.9*  0.6  PROT 8.4* 7.6  ALBUMIN 2.9* 2.6*    No results for input(s): "LIPASE", "AMYLASE" in the last 168 hours. No results for input(s): "AMMONIA" in the last 168 hours. Coagulation Profile: Recent Labs  Lab 11/28/22 0407  INR 1.3*    Cardiac Enzymes: No results for input(s): "CKTOTAL", "CKMB", "CKMBINDEX", "TROPONINI" in the last 168 hours. BNP (last 3 results) No results for input(s): "PROBNP" in the last 8760 hours. HbA1C: No results for input(s): "HGBA1C" in the last 72 hours. CBG: Recent Labs  Lab 11/27/22 1711  GLUCAP 154*    Lipid Profile: No results for input(s): "CHOL", "HDL", "LDLCALC", "TRIG", "CHOLHDL", "LDLDIRECT" in the last 72 hours. Thyroid Function Tests: No results for input(s): "TSH", "T4TOTAL", "FREET4", "T3FREE", "THYROIDAB" in the last 72 hours. Anemia Panel: No results for input(s): "VITAMINB12", "FOLATE", "FERRITIN", "TIBC", "IRON", "RETICCTPCT" in the last 72 hours. Sepsis Labs: Recent Labs  Lab 11/27/22 1100 11/27/22 1319 11/28/22 0407  PROCALCITON  --   --  1.26  LATICACIDVEN 1.2 1.2  --      Recent Results (from the past 240 hour(s))  Blood culture (routine x 2)     Status: None (Preliminary result)   Collection Time: 11/27/22 11:00 AM   Specimen: BLOOD  Result Value Ref Range Status   Specimen Description BLOOD RIGHT ANTECUBITAL  Final   Special Requests   Final    BOTTLES DRAWN AEROBIC AND ANAEROBIC Blood Culture results may not be  optimal due to an inadequate volume of blood received in culture bottles   Culture   Final    NO GROWTH 2 DAYS Performed at [facility], [address], [place], [place] [address]    Report Status PENDING  Incomplete  Resp panel by RT-PCR (RSV, Flu A&B, Covid) Anterior Nasal Swab     Status: None   Collection Time: 11/27/22 11:36 AM   Specimen: Anterior Nasal Swab  Result Value Ref Range Status   SARS Coronavirus 2 by RT PCR NEGATIVE NEGATIVE Final    Comment: (NOTE) SARS-CoV-2 target nucleic acids are NOT DETECTED.  The SARS-CoV-2 RNA is generally detectable in upper respiratory specimens during the acute phase of infection. The lowest concentration of SARS-CoV-2 viral copies this assay can detect is 138 copies/mL. A negative result does not preclude SARS-Cov-2 infection and should not be used as the sole basis for treatment or other patient management decisions. A negative result may occur with  improper specimen collection/handling, submission of specimen other than nasopharyngeal swab, presence of viral mutation(s) within the areas targeted by this assay, and inadequate number of viral copies(<138 copies/mL). A negative result must be combined with clinical observations, patient history, and epidemiological information. The expected result is Negative.  Fact Sheet for Patients:  EntrepreneurPulse.com.au  Fact Sheet for Healthcare Providers:  IncredibleEmployment.be  This test is no t yet approved or cleared by the [place] FDA and  has been authorized for detection and/or diagnosis of SARS-CoV-2 by FDA under an Emergency Use Authorization (EUA). This EUA will remain  in effect (meaning this test can be used) for the duration of the COVID-19 declaration under Section 564(b)(1) of the Act, 21 U.S.C.section 360bbb-3(b)(1), unless the authorization is terminated  or revoked sooner.       Influenza A by PCR NEGATIVE NEGATIVE Final    Influenza B by PCR NEGATIVE NEGATIVE Final    Comment: (NOTE) The Xpert Xpress SARS-CoV-2/FLU/RSV plus assay is intended as an aid in the diagnosis of influenza from Nasopharyngeal swab specimens  and should not be used as a sole basis for treatment. Nasal washings and aspirates are unacceptable for Xpert Xpress SARS-CoV-2/FLU/RSV testing.  Fact Sheet for Patients: EntrepreneurPulse.com.au  Fact Sheet for Healthcare Providers: IncredibleEmployment.be  This test is not yet approved or cleared by the [place] FDA and has been authorized for detection and/or diagnosis of SARS-CoV-2 by FDA under an Emergency Use Authorization (EUA). This EUA will remain in effect (meaning this test can be used) for the duration of the COVID-19 declaration under Section 564(b)(1) of the Act, 21 U.S.C. section 360bbb-3(b)(1), unless the authorization is terminated or revoked.     Resp Syncytial Virus by PCR NEGATIVE NEGATIVE Final    Comment: (NOTE) Fact Sheet for Patients: EntrepreneurPulse.com.au  Fact Sheet for Healthcare Providers: IncredibleEmployment.be  This test is not yet approved or cleared by the [place] FDA and has been authorized for detection and/or diagnosis of SARS-CoV-2 by FDA under an Emergency Use Authorization (EUA). This EUA will remain in effect (meaning this test can be used) for the duration of the COVID-19 declaration under Section 564(b)(1) of the Act, 21 U.S.C. section 360bbb-3(b)(1), unless the authorization is terminated or revoked.  Performed at [facility], [address] [place], [place] [address]   Urine Culture     Status: Abnormal   Collection Time: 11/27/22  2:30 PM   Specimen: Urine, Clean Catch  Result Value Ref Range Status   Specimen Description   Final    URINE, CLEAN CATCH Performed at [facility], [address], [place], [place] [address]    Special Requests   Final     NONE Performed at [facility], [address], [place], [place] [address]    Culture MULTIPLE SPECIES PRESENT, SUGGEST RECOLLECTION (A)  Final   Report Status 11/28/2022 FINAL  Final  Blood culture (routine x 2)     Status: None (Preliminary result)   Collection Time: 11/27/22  3:22 PM   Specimen: BLOOD  Result Value Ref Range Status   Specimen Description BLOOD BLOOD RIGHT HAND  Final   Special Requests   Final    BOTTLES DRAWN AEROBIC AND ANAEROBIC Blood Culture adequate volume   Culture   Final    NO GROWTH 2 DAYS Performed at [facility], [address], [place], [place] [address]    Report Status PENDING  Incomplete     Radiology Studies: MR BRAIN WO CONTRAST  Result Date: 11/28/2022 CLINICAL DATA:  Provided history: Neuro deficit, acute, stroke suspected. Altered mental status. EXAM: MRI HEAD WITHOUT CONTRAST TECHNIQUE: Multiplanar, multiecho pulse sequences of the brain and surrounding structures were obtained without intravenous contrast. COMPARISON:  Prior head CT examinations 11/27/2022 and earlier. Brain MRI 12/14/2003. FINDINGS: Brain: Moderate to advanced cerebral atrophy. Mild cerebellar atrophy. Multifocal T2 FLAIR hyperintense signal abnormality within the cerebral white matter, nonspecific but compatible with moderate chronic small vessel ischemic disease. Chronic lacunar infarcts within the bilateral deep gray nuclei and left internal capsule. Multiple tiny chronic infarcts within the bilateral cerebellar hemispheres. There is no acute infarct. No evidence of an intracranial mass. No chronic intracranial blood products. No extra-axial fluid collection. No midline shift. Vascular: Maintained flow voids within the proximal large arterial vessels. Skull and upper cervical spine: No focal suspicious marrow lesion. Incompletely assessed cervical spondylosis. Sinuses/Orbits: No mass or acute finding within the imaged orbits. Prior bilateral ocular lens replacement. Left scleral  buckle. Chronic depression deformities of the bilateral lamina papyracea. No significant paranasal sinus disease. IMPRESSION: 1. No evidence of  acute intracranial abnormality. 2. Moderate chronic small vessel ischemic changes within the cerebral white matter, progressed from the prior brain MRI of 12/14/2003. 3. Chronic lacunar infarcts within the bilateral deep gray nuclei and left internal capsule, some of which are new from the prior MRI. 4. Multiple tiny chronic infarcts in the bilateral cerebellar hemispheres, new from the prior MRI. 5. Moderate-to-advanced generalized cerebral atrophy, progressed. 6. Mild cerebellar atrophy. Electronically Signed   By: Kellie Simmering D.O.   On: 11/28/2022 16:22   DG Abd 1 View  Result Date: 11/28/2022 CLINICAL DATA:  Rule out foreign body for MRI clearance EXAM: ABDOMEN - 1 VIEW; PELVIS - 1-2 VIEW COMPARISON:  Abdominal radiograph October 01, 2021 FINDINGS: The bowel gas pattern is normal. Moderate colonic stool burden. Surgical clips projecting over the right upper quadrant. No metallic foreign body. Diffuse osseous demineralization. Overlying bowel gas partially obscures osseous detail of the sacrum. No acute osseous abnormality. Visualized bibasilar lungs clear. IMPRESSION: 1. No metallic foreign body identified. 2. Normal bowel-gas pattern with moderate colonic stool burden. Electronically Signed   By: Beryle Flock M.D.   On: 11/28/2022 14:56   DG Pelvis 1-2 Views  Result Date: 11/28/2022 CLINICAL DATA:  Rule out foreign body for MRI clearance EXAM: ABDOMEN - 1 VIEW; PELVIS - 1-2 VIEW COMPARISON:  Abdominal radiograph October 01, 2021 FINDINGS: The bowel gas pattern is normal. Moderate colonic stool burden. Surgical clips projecting over the right upper quadrant. No metallic foreign body. Diffuse osseous demineralization. Overlying bowel gas partially obscures osseous detail of the sacrum. No acute osseous abnormality. Visualized bibasilar lungs clear.  IMPRESSION: 1. No metallic foreign body identified. 2. Normal bowel-gas pattern with moderate colonic stool burden. Electronically Signed   By: Beryle Flock M.D.   On: 11/28/2022 14:56   CT Head Wo Contrast  Result Date: 11/27/2022 CLINICAL DATA:  Neuro deficit, acute, stroke suspected Mental status change, unknown cause EXAM: CT HEAD WITHOUT CONTRAST TECHNIQUE: Contiguous axial images were obtained from the base of the skull through the vertex without intravenous contrast. RADIATION DOSE REDUCTION: This exam was performed according to the departmental dose-optimization program which includes automated exposure control, adjustment of the mA and/or kV according to patient size and/or use of iterative reconstruction technique. COMPARISON:  CT head Apr 05, 2022. FINDINGS: Brain: Remote bilateral basal ganglia lacunar infarcts. No evidence of acute large vascular territory infarct. Additional patchy white matter hypodensities, nonspecific but compatible with chronic microvascular ischemic disease. Cerebral atrophy. No evidence of acute hemorrhage, mass lesion or midline shift. Vascular: No hyperdense vessel identified. Skull: No acute fracture. Sinuses/Orbits: Remote bilateral medial orbital wall fractures. No acute findings. Other: No mastoid effusions. IMPRESSION: 1. No evidence of acute intracranial abnormality. 2. Chronic microvascular ischemic disease and remote bilateral basal ganglia infarcts. Electronically Signed   By: Margaretha Sheffield M.D.   On: 11/27/2022 13:09   DG Chest 1 View  Result Date: 11/27/2022 CLINICAL DATA:  Altered mental status EXAM: CHEST  1 VIEW COMPARISON:  05/15/2022 FINDINGS: Upper normal heart size. Mediastinal contours and pulmonary vascularity normal. Atherosclerotic calcification aorta. Decreased lung volumes with bibasilar atelectasis. No definite infiltrate, pleural effusion, or pneumothorax. Osseous structures unremarkable. IMPRESSION: Bibasilar atelectasis without acute  infiltrate. Aortic Atherosclerosis (ICD10-I70.0). Electronically Signed   By: Lavonia Dana M.D.   On: 11/27/2022 13:07    Scheduled Meds:  heparin  5,000 Units Subcutaneous Q8H   Continuous Infusions:  cefTRIAXone (ROCEPHIN)  IV Stopped (11/28/22 2239)   lactated ringers 100 mL/hr at 11/29/22  [id number]     LOS: 2 days   Darliss Cheney, MD [facility]  11/29/2022, 11:57 AM   *Please note that this is a verbal dictation therefore any spelling or grammatical errors are due to the "[place] [employer]" system interpretation.  Please page via [place] and do not message via secure chat for urgent patient care matters. Secure chat can be used for non urgent patient care matters.  How to contact the [facility] Attending or Consulting provider [place] or covering provider during after hours [place], for this patient?  Check the care team in [facility] and look for a) attending/consulting [facility] provider listed and b) the [facility] team listed. Page or secure chat [phone]. Log into [email or web address] and use [place]'s universal password to access. If you do not have the password, please contact the hospital operator. Locate the [facility] provider you are looking for under [facility] and page to a number that you can be directly reached. If you still have difficulty reaching the provider, please page the [facility] (Director on Call) for the Hospitalists listed on amion for assistance.

## 2022-11-30 DIAGNOSIS — G9341 Metabolic encephalopathy: Secondary | ICD-10-CM | POA: Diagnosis not present

## 2022-11-30 LAB — BASIC METABOLIC PANEL
Anion gap: 11 (ref 5–15)
BUN: 77 mg/dL — ABNORMAL HIGH (ref 8–23)
CO2: 29 mmol/L (ref 22–32)
Calcium: 9.8 mg/dL (ref 8.9–10.3)
Chloride: 116 mmol/L — ABNORMAL HIGH (ref 98–111)
Creatinine, Ser: 1.6 mg/dL — ABNORMAL HIGH (ref 0.44–1.00)
GFR, Estimated: 30 mL/min — ABNORMAL LOW (ref >60)
Glucose, Bld: 129 mg/dL — ABNORMAL HIGH (ref 70–99)
Potassium: 4.7 mmol/L (ref 3.5–5.1)
Sodium: 156 mmol/L — ABNORMAL HIGH (ref 135–145)

## 2022-11-30 LAB — SODIUM: Sodium: 153 mmol/L — ABNORMAL HIGH (ref 135–145)

## 2022-11-30 MED ORDER — POTASSIUM CHLORIDE 2 MEQ/ML IV SOLN
INTRAVENOUS | Status: DC
  Filled 2022-11-30 (×6): qty 1000

## 2022-11-30 MED ORDER — POTASSIUM CHLORIDE 2 MEQ/ML IV SOLN: INTRAVENOUS | Status: DC

## 2022-11-30 NOTE — Progress Notes (Signed)
PROGRESS NOTE    [name] [name]  ZOX:[id number] DOB: 1929/11/28 DOA: 11/27/2022 PCP: Pcp, No   Brief Narrative:  HPI: [name] [name] is a 89 y.o. female with medical history significant of obesity, hypertension, hyperlipidemia, type 2 diabetes mellitus, GERD, constipation, carotid artery stenosis dementia and many other comorbidities was brought into the emergency department due to decreased responsiveness.  She lives at the nursing home and the staff noticed that she had reduced responsiveness/altered mental status today which is unusual for her.  Patient herself is unable to provide any history as she continues to talk without giving any break and her conversation is unrelated to the questions I asked.  Thus history was obtained from chart review and ED physician.  Reportedly, she was having altered mental status and staff noted strongly smelly urine so they were concerned about possible UTI.  When EMS arrived, her temperature was 102 F at the facility and she was tachycardic but normotensive.  She was brought into the emergency department here.   ED Course: Upon arrival here, she has had 1 episode of tachycardia and tachypnea but she is afebrile and otherwise hemodynamically stable.  Patient herself could not provide any history.  Further workup indicated that she met criteria for sepsis for possible UTI.  She also had AKI on CKD stage IV.  Zosyn is ordered for her, hospitalist were consulted for admission.  COVID-negative.  Assessment & Plan:   Principal Problem:   Acute metabolic encephalopathy Active Problems:   Diabetes mellitus type 2, diet-controlled (HCC)   Acute lower UTI   Sepsis ([place])   HTN (hypertension)   Acute renal failure superimposed on stage 4 chronic kidney disease (HCC)   Hyperglycemia due to type 2 diabetes mellitus (HCC)   Dementia with anxiety ([place])  Sepsis secondary to UTI, POA: Patient met criteria for sepsis based on leukocytosis, tachycardia and tachypnea, UA actually  equivocal, some signs of infection, no other source of infection thus UTI is likely source of infection in her.  Continue Rocephin, urine culture grew multiple species, blood culture negative.   Acute toxic encephalopathy: Today for the first time, patient is much more alert and she was oriented to place, herself and she also was able to tell me her date of birth.  This is likely her baseline.  Interestingly, her mental status has improved while her sodium is rising.  Hypernatremia: Sodium 156.  Will start her on dextrose 5% and check sodium every 6 hours.   Diet-controlled type 2 diabetes mellitus: She is not on any medications, recent hemoglobin A1c was 5.7.  No need for SSI.   AKI on CKD stage IV: Baseline creatinine appears to be around 1.8-2.0, presented with creatinine of 2.7.  IV fluids initiated, creatinine improved to 1.6.    Hyperlipidemia: Continue atorvastatin.   GERD: Continue PPI.   Dementia with anxiety/psychosis: Continue Depakote, quetiapine and Haldol.  DVT prophylaxis: heparin injection 5,000 Units Start: 11/27/22 1500   Code Status: DNR  Family Communication:  None present at bedside.   Status is: Inpatient  Remains inpatient appropriate because: Encephalopathy has almost resolved but now she is developing hypernatremia.  Estimated body mass index is 30.93 kg/m as calculated from the following:   Height as of this encounter: 5\' 9"  (1.753 m).   Weight as of this encounter: 95 kg.    Nutritional Assessment: Body mass index is 30.93 kg/m.[place] Kitchen Seen by dietician.  I agree with the assessment and plan as outlined below: Nutrition Status:        .  Skin Assessment: I have examined the patient's skin and I agree with the wound assessment as performed by the wound care RN as outlined below:    Consultants:  None  Procedures:  None  Antimicrobials:  Anti-infectives (From admission, onward)    Start     Dose/Rate Route Frequency Ordered Stop   11/27/22 2200   cefTRIAXone (ROCEPHIN) 2 g in sodium chloride 0.9 % 100 mL IVPB        2 g 200 mL/hr over 30 Minutes Intravenous Every 24 hours 11/27/22 1448 12/04/22 2159   11/27/22 1445  piperacillin-tazobactam (ZOSYN) IVPB 3.375 g        3.375 g 100 mL/hr over 30 Minutes Intravenous  Once 11/27/22 1438 11/27/22 1518         Subjective:  patient seen and examined.  She was much more alert and oriented today.  Objective: Vitals:   11/29/22 0428 11/29/22 1352 11/29/22 1800 11/29/22 2254  BP: (!) 136/55 128/74  124/70  Pulse: 73 (!) 101 86 93  Resp: 17 16  17   Temp: (!) 97.4 F (36.3 C) 97.6 F (36.4 C)  98.2 F (36.8 C)  TempSrc:  Oral  Oral  SpO2: 100% 100%  99%  Weight:      Height:        Intake/Output Summary (Last 24 hours) at 11/30/2022 1048 Last data filed at 11/30/2022 0744 Gross per 24 hour  Intake 3891.56 ml  Output 200 ml  Net 3691.56 ml    Filed Weights   11/27/22 1109  Weight: 95 kg    Examination:  General exam: Appears calm and comfortable  Respiratory system: Clear to auscultation. Respiratory effort normal. Cardiovascular system: S1 & S2 heard, RRR. No JVD, murmurs, rubs, gallops or clicks. No pedal edema. Gastrointestinal system: Abdomen is nondistended, soft and nontender. No organomegaly or masses felt. Normal bowel sounds heard. Central nervous system: Alert and oriented x 2. No focal neurological deficits.  She is globally weak. Extremities: Symmetric 5 x 5 power. Skin: No rashes, lesions or ulcers.   Data Reviewed: I have personally reviewed following labs and imaging studies  CBC: Recent Labs  Lab 11/27/22 1100 11/28/22 0407  WBC 15.3* 16.0*  NEUTROABS 11.1*  --   HGB 10.4* 9.8*  HCT 34.3* 33.3*  MCV 102.7* 105.4*  PLT 325 160    Basic Metabolic Panel: Recent Labs  Lab 11/27/22 1100 11/27/22 1447 11/28/22 0407 11/28/22 1428 11/28/22 1711 11/29/22 0812 11/30/22 0853  NA 146*  --  149* 148* 149* 152* 156*  K 4.6  --  4.7  --   --   4.8 4.7  CL 107  --  112*  --   --  115* 116*  CO2 27  --  27  --   --  27 29  GLUCOSE 166*  --  146*  --   --  132* 129*  BUN 100*  --  100*  --   --  87* 77*  CREATININE 2.71*  --  2.10*  --   --  1.61* 1.60*  CALCIUM 9.2  --  9.1  --   --  9.6 9.8  MG 2.8* 2.8*  --   --   --   --   --     GFR: Estimated Creatinine Clearance: 28.7 mL/min (A) (by C-G formula based on SCr of 1.6 mg/dL (H)). Liver Function Tests: Recent Labs  Lab 11/27/22 1100 11/28/22 0407  AST 93* 47*  ALT 126* 93*  ALKPHOS 146* 117  BILITOT 1.9* 0.6  PROT 8.4* 7.6  ALBUMIN 2.9* 2.6*    No results for input(s): "LIPASE", "AMYLASE" in the last 168 hours. No results for input(s): "AMMONIA" in the last 168 hours. Coagulation Profile: Recent Labs  Lab 11/28/22 0407  INR 1.3*    Cardiac Enzymes: No results for input(s): "CKTOTAL", "CKMB", "CKMBINDEX", "TROPONINI" in the last 168 hours. BNP (last 3 results) No results for input(s): "PROBNP" in the last 8760 hours. HbA1C: No results for input(s): "HGBA1C" in the last 72 hours. CBG: Recent Labs  Lab 11/27/22 1711  GLUCAP 154*    Lipid Profile: No results for input(s): "CHOL", "HDL", "LDLCALC", "TRIG", "CHOLHDL", "LDLDIRECT" in the last 72 hours. Thyroid Function Tests: No results for input(s): "TSH", "T4TOTAL", "FREET4", "T3FREE", "THYROIDAB" in the last 72 hours. Anemia Panel: No results for input(s): "VITAMINB12", "FOLATE", "FERRITIN", "TIBC", "IRON", "RETICCTPCT" in the last 72 hours. Sepsis Labs: Recent Labs  Lab 11/27/22 1100 11/27/22 1319 11/28/22 0407  PROCALCITON  --   --  1.26  LATICACIDVEN 1.2 1.2  --      Recent Results (from the past 240 hour(s))  Blood culture (routine x 2)     Status: None (Preliminary result)   Collection Time: 11/27/22 11:00 AM   Specimen: BLOOD  Result Value Ref Range Status   Specimen Description BLOOD RIGHT ANTECUBITAL  Final   Special Requests   Final    BOTTLES DRAWN AEROBIC AND ANAEROBIC Blood  Culture results may not be optimal due to an inadequate volume of blood received in culture bottles   Culture   Final    NO GROWTH 3 DAYS Performed at [facility], [address], [place], [place] [address]    Report Status PENDING  Incomplete  Resp panel by RT-PCR (RSV, Flu A&B, Covid) Anterior Nasal Swab     Status: None   Collection Time: 11/27/22 11:36 AM   Specimen: Anterior Nasal Swab  Result Value Ref Range Status   SARS Coronavirus 2 by RT PCR NEGATIVE NEGATIVE Final    Comment: (NOTE) SARS-CoV-2 target nucleic acids are NOT DETECTED.  The SARS-CoV-2 RNA is generally detectable in upper respiratory specimens during the acute phase of infection. The lowest concentration of SARS-CoV-2 viral copies this assay can detect is 138 copies/mL. A negative result does not preclude SARS-Cov-2 infection and should not be used as the sole basis for treatment or other patient management decisions. A negative result may occur with  improper specimen collection/handling, submission of specimen other than nasopharyngeal swab, presence of viral mutation(s) within the areas targeted by this assay, and inadequate number of viral copies(<138 copies/mL). A negative result must be combined with clinical observations, patient history, and epidemiological information. The expected result is Negative.  Fact Sheet for Patients:  EntrepreneurPulse.com.au  Fact Sheet for Healthcare Providers:  IncredibleEmployment.be  This test is no t yet approved or cleared by the [place] FDA and  has been authorized for detection and/or diagnosis of SARS-CoV-2 by FDA under an Emergency Use Authorization (EUA). This EUA will remain  in effect (meaning this test can be used) for the duration of the COVID-19 declaration under Section 564(b)(1) of the Act, 21 U.S.C.section 360bbb-3(b)(1), unless the authorization is terminated  or revoked sooner.       Influenza A by PCR  NEGATIVE NEGATIVE Final   Influenza B by PCR NEGATIVE NEGATIVE Final    Comment: (NOTE) The Xpert Xpress SARS-CoV-2/FLU/RSV plus assay is intended as an aid in the diagnosis  of influenza from Nasopharyngeal swab specimens and should not be used as a sole basis for treatment. Nasal washings and aspirates are unacceptable for Xpert Xpress SARS-CoV-2/FLU/RSV testing.  Fact Sheet for Patients: EntrepreneurPulse.com.au  Fact Sheet for Healthcare Providers: IncredibleEmployment.be  This test is not yet approved or cleared by the [place] FDA and has been authorized for detection and/or diagnosis of SARS-CoV-2 by FDA under an Emergency Use Authorization (EUA). This EUA will remain in effect (meaning this test can be used) for the duration of the COVID-19 declaration under Section 564(b)(1) of the Act, 21 U.S.C. section 360bbb-3(b)(1), unless the authorization is terminated or revoked.     Resp Syncytial Virus by PCR NEGATIVE NEGATIVE Final    Comment: (NOTE) Fact Sheet for Patients: EntrepreneurPulse.com.au  Fact Sheet for Healthcare Providers: IncredibleEmployment.be  This test is not yet approved or cleared by the [place] FDA and has been authorized for detection and/or diagnosis of SARS-CoV-2 by FDA under an Emergency Use Authorization (EUA). This EUA will remain in effect (meaning this test can be used) for the duration of the COVID-19 declaration under Section 564(b)(1) of the Act, 21 U.S.C. section 360bbb-3(b)(1), unless the authorization is terminated or revoked.  Performed at [facility], [address], [place], [place] [address]   Urine Culture     Status: Abnormal   Collection Time: 11/27/22  2:30 PM   Specimen: Urine, Clean Catch  Result Value Ref Range Status   Specimen Description   Final    URINE, CLEAN CATCH Performed at [facility], [address], [place], [place] [address]     Special Requests   Final    NONE Performed at [facility], [address], [place], [place] [address]    Culture MULTIPLE SPECIES PRESENT, SUGGEST RECOLLECTION (A)  Final   Report Status 11/28/2022 FINAL  Final  Blood culture (routine x 2)     Status: None (Preliminary result)   Collection Time: 11/27/22  3:22 PM   Specimen: BLOOD  Result Value Ref Range Status   Specimen Description BLOOD BLOOD RIGHT HAND  Final   Special Requests   Final    BOTTLES DRAWN AEROBIC AND ANAEROBIC Blood Culture adequate volume   Culture   Final    NO GROWTH 3 DAYS Performed at [facility], [address] [place], [place] [address]    Report Status PENDING  Incomplete     Radiology Studies: MR BRAIN WO CONTRAST  Result Date: 11/28/2022 CLINICAL DATA:  Provided history: Neuro deficit, acute, stroke suspected. Altered mental status. EXAM: MRI HEAD WITHOUT CONTRAST TECHNIQUE: Multiplanar, multiecho pulse sequences of the brain and surrounding structures were obtained without intravenous contrast. COMPARISON:  Prior head CT examinations 11/27/2022 and earlier. Brain MRI 12/14/2003. FINDINGS: Brain: Moderate to advanced cerebral atrophy. Mild cerebellar atrophy. Multifocal T2 FLAIR hyperintense signal abnormality within the cerebral white matter, nonspecific but compatible with moderate chronic small vessel ischemic disease. Chronic lacunar infarcts within the bilateral deep gray nuclei and left internal capsule. Multiple tiny chronic infarcts within the bilateral cerebellar hemispheres. There is no acute infarct. No evidence of an intracranial mass. No chronic intracranial blood products. No extra-axial fluid collection. No midline shift. Vascular: Maintained flow voids within the proximal large arterial vessels. Skull and upper cervical spine: No focal suspicious marrow lesion. Incompletely assessed cervical spondylosis. Sinuses/Orbits: No mass or acute finding within the imaged orbits. Prior bilateral ocular lens  replacement. Left scleral buckle. Chronic depression deformities of the bilateral lamina papyracea. No significant paranasal sinus  disease. IMPRESSION: 1. No evidence of acute intracranial abnormality. 2. Moderate chronic small vessel ischemic changes within the cerebral white matter, progressed from the prior brain MRI of 12/14/2003. 3. Chronic lacunar infarcts within the bilateral deep gray nuclei and left internal capsule, some of which are new from the prior MRI. 4. Multiple tiny chronic infarcts in the bilateral cerebellar hemispheres, new from the prior MRI. 5. Moderate-to-advanced generalized cerebral atrophy, progressed. 6. Mild cerebellar atrophy. Electronically Signed   By: Kellie Simmering D.O.   On: 11/28/2022 16:22   DG Abd 1 View  Result Date: 11/28/2022 CLINICAL DATA:  Rule out foreign body for MRI clearance EXAM: ABDOMEN - 1 VIEW; PELVIS - 1-2 VIEW COMPARISON:  Abdominal radiograph October 01, 2021 FINDINGS: The bowel gas pattern is normal. Moderate colonic stool burden. Surgical clips projecting over the right upper quadrant. No metallic foreign body. Diffuse osseous demineralization. Overlying bowel gas partially obscures osseous detail of the sacrum. No acute osseous abnormality. Visualized bibasilar lungs clear. IMPRESSION: 1. No metallic foreign body identified. 2. Normal bowel-gas pattern with moderate colonic stool burden. Electronically Signed   By: Beryle Flock M.D.   On: 11/28/2022 14:56   DG Pelvis 1-2 Views  Result Date: 11/28/2022 CLINICAL DATA:  Rule out foreign body for MRI clearance EXAM: ABDOMEN - 1 VIEW; PELVIS - 1-2 VIEW COMPARISON:  Abdominal radiograph October 01, 2021 FINDINGS: The bowel gas pattern is normal. Moderate colonic stool burden. Surgical clips projecting over the right upper quadrant. No metallic foreign body. Diffuse osseous demineralization. Overlying bowel gas partially obscures osseous detail of the sacrum. No acute osseous abnormality. Visualized  bibasilar lungs clear. IMPRESSION: 1. No metallic foreign body identified. 2. Normal bowel-gas pattern with moderate colonic stool burden. Electronically Signed   By: Beryle Flock M.D.   On: 11/28/2022 14:56    Scheduled Meds:  ascorbic acid  500 mg Oral BID   atorvastatin  20 mg Oral Daily   divalproex  250 mg Oral TID   ferrous sulfate  325 mg Oral Q breakfast   heparin  5,000 Units Subcutaneous Q8H   pantoprazole  40 mg Oral Daily   QUEtiapine  25 mg Oral QHS   zinc sulfate  220 mg Oral Daily   Continuous Infusions:  cefTRIAXone (ROCEPHIN)  IV Stopped (11/29/22 2224)   dextrose 5 % 1,000 mL with potassium chloride 10 mEq infusion       LOS: 3 days   Darliss Cheney, MD [facility]  11/30/2022, 10:48 AM   *Please note that this is a verbal dictation therefore any spelling or grammatical errors are due to the "[place] [employer]" system interpretation.  Please page via [place] and do not message via secure chat for urgent patient care matters. Secure chat can be used for non urgent patient care matters.  How to contact the [facility] Attending or Consulting provider [place] or covering provider during after hours [place], for this patient?  Check the care team in [facility] and look for a) attending/consulting [facility] provider listed and b) the [facility] team listed. Page or secure chat [phone]. Log into [email or web address] and use [place]'s universal password to access. If you do not have the password, please contact the hospital operator. Locate the [facility] provider you are looking for under [facility] and page to a number that you can be directly reached. If you still have difficulty reaching the provider, please page the [facility] (Director on Call) for the Hospitalists listed on amion for assistance.

## 2022-12-01 DIAGNOSIS — G9341 Metabolic encephalopathy: Secondary | ICD-10-CM | POA: Diagnosis not present

## 2022-12-01 LAB — BASIC METABOLIC PANEL
Anion gap: 9 (ref 5–15)
Anion gap: 13 (ref 5–15)
BUN: 66 mg/dL — ABNORMAL HIGH (ref 8–23)
BUN: 59 mg/dL — ABNORMAL HIGH (ref 8–23)
CO2: 27 mmol/L (ref 22–32)
CO2: 21 mmol/L — ABNORMAL LOW (ref 22–32)
Calcium: 9.5 mg/dL (ref 8.9–10.3)
Calcium: 9.4 mg/dL (ref 8.9–10.3)
Chloride: 114 mmol/L — ABNORMAL HIGH (ref 98–111)
Chloride: 112 mmol/L — ABNORMAL HIGH (ref 98–111)
Creatinine, Ser: 1.36 mg/dL — ABNORMAL HIGH (ref 0.44–1.00)
Creatinine, Ser: 1.36 mg/dL — ABNORMAL HIGH (ref 0.44–1.00)
GFR, Estimated: 37 mL/min — ABNORMAL LOW (ref >60)
GFR, Estimated: 37 mL/min — ABNORMAL LOW (ref >60)
Glucose, Bld: 173 mg/dL — ABNORMAL HIGH (ref 70–99)
Glucose, Bld: 183 mg/dL — ABNORMAL HIGH (ref 70–99)
Potassium: 4.3 mmol/L (ref 3.5–5.1)
Potassium: 4.6 mmol/L (ref 3.5–5.1)
Sodium: 150 mmol/L — ABNORMAL HIGH (ref 135–145)
Sodium: 146 mmol/L — ABNORMAL HIGH (ref 135–145)

## 2022-12-01 MED ORDER — NYSTATIN 100000 UNIT/ML MT SUSP
5.0000 mL | Freq: Four times a day (QID) | OROMUCOSAL | Status: DC
  Administered 2022-12-01 – 2022-12-04 (×6): 500000 [IU] via ORAL
  Filled 2022-12-01 (×10): qty 5

## 2022-12-01 NOTE — Progress Notes (Signed)
[facility] Progress Note  Patient: [name] [name]     IAX:[id number]  DOA: 11/27/2022   PCP: Pcp, No       Brief hospital course: This is a 89 year old female with dementia who resides in a SNF.  She also has diabetes mellitus type 2, hypertension, hyperlipidemia, obesity, GERD, carotid artery stenosis and is followed by upper care hospice as outpatient. - She was sent from her nursing home for altered mental status and strong smelling urine and a suspicion for UTI.  EMS noted her temperature to be 102 degrees.  In the ED, she had tachypnea and tachycardia.  Temperature had improved to 99 degrees. Sodium was 147 and creatinine was 2.71.  WBC count was 15.3, lactic acid was 1.2 and procalcitonin later came back at 1.26. The UA showed many bacteria, moderate leukocytes and was cloudy. A UA was performed and ceftriaxone was given. Urine culture revealed multiple species.  Rocephin was continued. The patient was treated with LR but sodium continued to rise until it reached 156.  She was transitioned to a D5 infusion with 10 mEq of potassium and sodium has steadily been improving.  The patient's mental status has also been improving, reportedly.   Subjective:  She is only able to tell me her name and is unable to tell me if she has any complaints. Assessment and Plan: Principal Problem:   Acute metabolic encephalopathy -Felt to be secondary to UTI-possibly also secondary to AKI - She is awake and alert and is only able to state her name-she follows some commands  Active Problems:    Acute lower UTI with leukocytosis and fever-sepsis present on admission -Urine culture was contaminated - Will need to complete 7 days of antibiotics-she is currently on ceftriaxone while she is in the hospital - home medicines also include Macrodantin which is on hold  Hypernatremia - Continue D5 with 10 mEq of K-sodium is improving   Acute renal failure superimposed on stage 3b chronic kidney disease  (HCC) -Creatinine has improved to 1.36 and GFR is 37  Dementia - No behavioral disturbances noted thus far - Continue Seroquel, as needed Haldol and 3 times daily Depakote  Glucose intolerance - Follow without sliding scale for now-lab draw done this morning-morning CBG was 173 Hemoglobin A1C    Component Value Date/Time   HGBA1C 5.7 (H) 04/05/2022 0725    Palliative care patient Recommend discontinue Lipitor I have touch base with case manager and asked for a floor care to continue to follow this patient at the hospital.  - Discharge is planned when sodium drops below 145 as long as the patient has no other acute issues    Code Status: DNR Consultants: None Level of Care: Level of care: Med-Surg Total time on patient care: 35 minutes DVT prophylaxis:  heparin injection 5,000 Units Start: 11/27/22 1500     Objective:   Vitals:   11/30/22 1435 11/30/22 2300 11/30/22 2342 12/01/22 0616  BP: 118/72 (!) 143/44 (!) 143/44 (!) 149/78  Pulse: 88 67 71 71  Resp: 17     Temp: 97.9 F (36.6 C) 97.8 F (36.6 C) 97.8 F (36.6 C)   TempSrc:  Oral Oral   SpO2: 100% 100% 100% 100%  Weight:      Height:       Filed Weights   11/27/22 1109  Weight: 95 kg   Exam: General exam: Appears comfortable-oriented only to person-extremely hard of hearing food is sitting in front of her but she is not  feeding herself HEENT: oral mucosa dry  Respiratory system: Clear to auscultation.  Cardiovascular system: S1 & S2 heard  Gastrointestinal system: Abdomen soft, non-tender, nondistended. Normal bowel sounds   Extremities: No cyanosis, clubbing or edema Psychiatry:  Mood & affect appropriate.      CBC: Recent Labs  Lab 11/27/22 1100 11/28/22 0407  WBC 15.3* 16.0*  NEUTROABS 11.1*  --   HGB 10.4* 9.8*  HCT 34.3* 33.3*  MCV 102.7* 105.4*  PLT 325 170   Basic Metabolic Panel: Recent Labs  Lab 11/27/22 1100 11/27/22 1447 11/28/22 0407 11/28/22 1428 11/28/22 1711  11/29/22 0812 11/30/22 0853 11/30/22 1429 12/01/22 0401  NA 146*  --  149*   < > 149* 152* 156* 153* 150*  K 4.6  --  4.7  --   --  4.8 4.7  --  4.3  CL 107  --  112*  --   --  115* 116*  --  114*  CO2 27  --  27  --   --  27 29  --  27  GLUCOSE 166*  --  146*  --   --  132* 129*  --  173*  BUN 100*  --  100*  --   --  87* 77*  --  66*  CREATININE 2.71*  --  2.10*  --   --  1.61* 1.60*  --  1.36*  CALCIUM 9.2  --  9.1  --   --  9.6 9.8  --  9.5  MG 2.8* 2.8*  --   --   --   --   --   --   --    < > = values in this interval not displayed.   GFR: Estimated Creatinine Clearance: 33.7 mL/min (A) (by C-G formula based on SCr of 1.36 mg/dL (H)).  Scheduled Meds:  ascorbic acid  500 mg Oral BID   atorvastatin  20 mg Oral Daily   divalproex  250 mg Oral TID   ferrous sulfate  325 mg Oral Q breakfast   heparin  5,000 Units Subcutaneous Q8H   pantoprazole  40 mg Oral Daily   QUEtiapine  25 mg Oral QHS   zinc sulfate  220 mg Oral Daily   Continuous Infusions:  cefTRIAXone (ROCEPHIN)  IV 2 g (11/30/22 2349)   dextrose 5 % 1,000 mL with potassium chloride 10 mEq infusion 100 mL/hr at 12/01/22 1016   Imaging and lab data was personally reviewed No results found.  LOS: 4 days   Author: Debbe Odea  12/01/2022 12:55 PM  To contact [facility]>   Check the care team in [facility] and look for the attending/consulting [facility] provider listed  Log into [email or web address] and use [place]'s universal password   Go to> "[facility]"  and find provider  If you still have difficulty reaching the provider, please page the [facility] (Director on Call) for the Hospitalists listed on amion

## 2022-12-02 ENCOUNTER — Inpatient Hospital Stay (HOSPITAL_COMMUNITY): Payer: Medicare Other

## 2022-12-02 DIAGNOSIS — G9341 Metabolic encephalopathy: Secondary | ICD-10-CM | POA: Diagnosis not present

## 2022-12-02 LAB — BASIC METABOLIC PANEL
Anion gap: 12 (ref 5–15)
Anion gap: 8 (ref 5–15)
BUN: 49 mg/dL — ABNORMAL HIGH (ref 8–23)
BUN: 46 mg/dL — ABNORMAL HIGH (ref 8–23)
CO2: 22 mmol/L (ref 22–32)
CO2: 24 mmol/L (ref 22–32)
Calcium: 9.3 mg/dL (ref 8.9–10.3)
Calcium: 9.3 mg/dL (ref 8.9–10.3)
Chloride: 111 mmol/L (ref 98–111)
Chloride: 110 mmol/L (ref 98–111)
Creatinine, Ser: 1.31 mg/dL — ABNORMAL HIGH (ref 0.44–1.00)
Creatinine, Ser: 1.25 mg/dL — ABNORMAL HIGH (ref 0.44–1.00)
GFR, Estimated: 39 mL/min — ABNORMAL LOW (ref >60)
GFR, Estimated: 41 mL/min — ABNORMAL LOW (ref >60)
Glucose, Bld: 159 mg/dL — ABNORMAL HIGH (ref 70–99)
Glucose, Bld: 135 mg/dL — ABNORMAL HIGH (ref 70–99)
Potassium: 5.2 mmol/L — ABNORMAL HIGH (ref 3.5–5.1)
Potassium: 4.8 mmol/L (ref 3.5–5.1)
Sodium: 145 mmol/L (ref 135–145)
Sodium: 142 mmol/L (ref 135–145)

## 2022-12-02 LAB — GLUCOSE, CAPILLARY
Glucose-Capillary: 139 mg/dL — ABNORMAL HIGH (ref 70–99)
Glucose-Capillary: 132 mg/dL — ABNORMAL HIGH (ref 70–99)
Glucose-Capillary: 111 mg/dL — ABNORMAL HIGH (ref 70–99)

## 2022-12-02 LAB — CBC
HCT: 38.6 % (ref 36.0–46.0)
Hemoglobin: 10.8 g/dL — ABNORMAL LOW (ref 12.0–15.0)
MCH: 31.1 pg (ref 26.0–34.0)
MCHC: 28 g/dL — ABNORMAL LOW (ref 30.0–36.0)
MCV: 111.2 fL — ABNORMAL HIGH (ref 80.0–100.0)
Platelets: 195 10*3/uL (ref 150–400)
RBC: 3.47 MIL/uL — ABNORMAL LOW (ref 3.87–5.11)
RDW: 13.4 % (ref 11.5–15.5)
WBC: 13.8 10*3/uL — ABNORMAL HIGH (ref 4.0–10.5)
nRBC: 0 % (ref 0.0–0.2)

## 2022-12-02 MED ORDER — SODIUM CHLORIDE 0.45 % IV SOLN: INTRAVENOUS | Status: DC

## 2022-12-02 NOTE — Consult Note (Addendum)
[facility] Nurse Consult Note: Reason for Consult: Consult requested for right heel. Performed remotely after review of progress notes and bedside nursing wound care flow sheet. Pt is noted to have a dark red-purple Deep tissue pressure injury to the right heel, 2X2.5cm.  These are high risk to evolve into full thickness tissue loss within 7-10 days.   Pressure Injury POA: No Dressing procedure/placement/frequency: Float heel to reduce pressure.  Topical treatment orders provided for bedside nurses to perform as follows to assist with promoting healing: Apply Xeroform gauze to right heel Q day and cover with foam dressing.  (Change foam dressing Q 3 days or PRN soiling.) Please re-consult if further assistance is needed.  Thank-you,  Julien Girt MSN, [place], [place], [place], [place]

## 2022-12-02 NOTE — Progress Notes (Signed)
Patient sleeping through majority of this shift. Unable to give meds as patient has not been alert enough to take them, very little sip of fluid earlier, has not eaten anything this shift. She will wake up enough to tell me her name at times but goes right back to sleep, unable to follow commands at this time. Left sided facial droop noted and weakness to left arm. MD notified.

## 2022-12-02 NOTE — TOC Progression Note (Signed)
Transition of Care [facility]) - Progression Note    Patient Details  Name: [name] [name] MRN: [id number] Date of Birth: [birth year]  Transition of Care [facility]) CM/SW Contact  Boneta Lucks, RN Phone Number: 12/02/2022, 3:39 PM  Clinical Narrative:   CM spoke with Jackelyn Poling and [place] at [facility]. Patient is not active with any palliative or hospital agency. [facility] offered them services for her, but patient was in the hospital so they did not give them the referral. Patient is not eating or drinking today. Per Debbie at baseline she is sitting up in wheelchair and singing. TOC to follow.     Expected Discharge Plan: [place] Barriers to Discharge: Continued Medical Work up  Expected Discharge Plan and Services In-house Referral: Clinical Social Work Discharge Planning Services: CM Consult Post Acute Care Choice: Nursing Home Living arrangements for the past 2 months: [place]                      Social Determinants of Health (SDOH) Interventions SDOH Screenings   Tobacco Use: Low Risk  (11/27/2022)    Readmission Risk Interventions    11/28/2022    1:29 PM 04/17/2022   12:10 PM 09/12/2021    2:32 PM  Readmission Risk Prevention Plan  Transportation Screening Complete Complete Complete  HRI or Home Care Consult Complete    Social Work Consult for [place] Planning/Counseling Complete    Palliative Care Screening Not Applicable    Medication Review Press photographer) Complete  Complete  HRI or Home Care Consult  Complete   SW Recovery Care/Counseling Consult  Complete Complete  Palliative Care Screening  Complete Complete  Skilled Nursing Facility  Complete Complete

## 2022-12-02 NOTE — Progress Notes (Signed)
[facility] Progress Note  Patient: [name] [name]     UEK:[id number]  DOA: 11/27/2022   PCP: Pcp, No       Brief hospital course: This is a 89 year old female with dementia who resides in a SNF.  She also has diabetes mellitus type 2, hypertension, hyperlipidemia, obesity, GERD, carotid artery stenosis and is followed by upper care hospice as outpatient. - She was sent from her nursing home for altered mental status and strong smelling urine and a suspicion for UTI.  EMS noted her temperature to be 102 degrees.  In the ED, she had tachypnea and tachycardia.  Temperature had improved to 99 degrees. Sodium was 147 and creatinine was 2.71.  WBC count was 15.3, lactic acid was 1.2 and procalcitonin later came back at 1.26. The UA showed many bacteria, moderate leukocytes and was cloudy. A UA was performed and ceftriaxone was given. Urine culture revealed multiple species.  Rocephin was continued. The patient was treated with LR but sodium continued to rise until it reached 156.  She was transitioned to a D5 infusion with 10 mEq of potassium and sodium has steadily been improving.      Subjective:  She was able to tell me her name but was repeatedly stating "I need to get out of bed" and trying to get out of bed. Did not follow commands.  Assessment and Plan: Principal Problem:   Acute metabolic encephalopathy- severe dementia - baseline is alert, singing while sitting in wheelchair- goal was to get her back to her SNF - change in her baseline felt to be secondary to UTI-possibly also secondary to AKI- she was improving with treatment- - not eating/ drinking or taking meds today- has not taken Depakote- did not receive any PRN oral Haldol- did take her Seroquel last night - Cr stable at 1.31, K a little high at 5.2- cause for somnolence not clear  - Whitney Early, LPN who evaluated her noted that she did not take her Nystatin and left face was drooping - she has subsequently continued  to be sleepy and not taking orals or meds - obtain MRI brain - called granddaughter to update her- no answer- voicemail left - I have spoken with TOC to see if [facility] or any palliative care service is actually following the patient at [facility] - palliative care consulted- left message for granddaughter to update her.  Active Problems:    Acute lower UTI with leukocytosis and fever-sepsis present on admission -Urine culture was contaminated and showed multiple species - Will need to complete 7 days of antibiotics-she is currently on ceftriaxone while she is in the hospital - home medicines  include Macrodantin which is on hold - WBC improving, no recurrence of fevers  Hypernatremia - has been on D5 with 10 mEq of K - Na 145 today and D5 stopped - no oral intake this AM- resume IVF- this time 1/2 NS - Bmet was every 12 hrs  Hyperkalemia - very mild at 5.2- hold D5 with 10 meq KCL- Bet ordered but still not done or resulted    Acute renal failure superimposed on stage 3b chronic kidney disease (HCC) -Creatinine has improved to 1.31 and GFR is 39  Thrush - Nystatin ordered on 1/23- cannot take it right now - hold off on Fluconazole as she is at risk for QT prolongation   Glucose intolerance - Follow without sliding scale for now- morning CBG was 159- avoid tight control Hemoglobin A1C    Component Value  Date/Time   HGBA1C 5.7 (H) 04/05/2022 0725    H/o QT prolongation - recheck EKG today  Disposition: Poor prognosis unfortunately      Code Status: DNR Consultants: None Level of Care: Level of care: Med-Surg Total time on patient care: 45 minutes DVT prophylaxis:  heparin injection 5,000 Units Start: 11/27/22 1500     Objective:   Vitals:   12/01/22 2202 12/02/22 0523 12/02/22 0932 12/02/22 1348  BP: (!) 124/54 (!) 148/81 (!) 146/89 (!) 112/56  Pulse: 77 89 98 81  Resp: 16 18    Temp: 98.2 F (36.8 C) 97.8 F (36.6 C)    TempSrc:      SpO2: 100% 95%  100% 100%  Weight:      Height:       Filed Weights   11/27/22 1109  Weight: 95 kg   Exam: General exam: alert and restless on my exam, trying to get out of bed but far to weak to do so HEENT: oral mucosa moist Respiratory system: Clear to auscultation.  Cardiovascular system: S1 & S2 heard  Gastrointestinal system: Abdomen soft, non-tender, nondistended. Normal bowel sounds   Extremities: No cyanosis, clubbing or edema Psychiatry:   agitated      CBC: Recent Labs  Lab 11/27/22 1100 11/28/22 0407 12/02/22 0339  WBC 15.3* 16.0* 13.8*  NEUTROABS 11.1*  --   --   HGB 10.4* 9.8* 10.8*  HCT 34.3* 33.3* 38.6  MCV 102.7* 105.4* 111.2*  PLT 325 281 546    Basic Metabolic Panel: Recent Labs  Lab 11/27/22 1100 11/27/22 1447 11/28/22 0407 11/29/22 0812 11/30/22 0853 11/30/22 1429 12/01/22 0401 12/01/22 1511 12/02/22 0339  NA 146*  --    < > 152* 156* 153* 150* 146* 145  K 4.6  --    < > 4.8 4.7  --  4.3 4.6 5.2*  CL 107  --    < > 115* 116*  --  114* 112* 111  CO2 27  --    < > 27 29  --  27 21* 22  GLUCOSE 166*  --    < > 132* 129*  --  173* 183* 159*  BUN 100*  --    < > 87* 77*  --  66* 59* 49*  CREATININE 2.71*  --    < > 1.61* 1.60*  --  1.36* 1.36* 1.31*  CALCIUM 9.2  --    < > 9.6 9.8  --  9.5 9.4 9.3  MG 2.8* 2.8*  --   --   --   --   --   --   --    < > = values in this interval not displayed.    GFR: Estimated Creatinine Clearance: 35 mL/min (A) (by C-G formula based on SCr of 1.31 mg/dL (H)).  Scheduled Meds:  ascorbic acid  500 mg Oral BID   atorvastatin  20 mg Oral Daily   divalproex  250 mg Oral TID   ferrous sulfate  325 mg Oral Q breakfast   heparin  5,000 Units Subcutaneous Q8H   nystatin  5 mL Oral QID   pantoprazole  40 mg Oral Daily   QUEtiapine  25 mg Oral QHS   zinc sulfate  220 mg Oral Daily   Continuous Infusions:  cefTRIAXone (ROCEPHIN)  IV Stopped (12/01/22 2248)   Imaging and lab data was personally reviewed No results  found.  LOS: 5 days   Author: Debbe Odea  12/02/2022 3:05 PM  To contact [facility]>   Check the care team in [facility] and look for the attending/consulting [facility] provider listed  Log into [email or web address] and use [place]'s universal password   Go to> "[facility]"  and find provider  If you still have difficulty reaching the provider, please page the [facility] (Director on Call) for the Hospitalists listed on amion

## 2022-12-02 NOTE — Progress Notes (Signed)
MD Wynetta Emery in to evaluate pt due to concern for questionable increased R) facial drooping and inability to take oral meds this am. Pt currently awake, alert, denies c/o. Refused oral liquid. Minimal movement of right hand and continued facial droop from previous CVA but seems unchanged since admission.

## 2022-12-03 ENCOUNTER — Encounter (HOSPITAL_COMMUNITY): Payer: Self-pay | Admitting: Family Medicine

## 2022-12-03 DIAGNOSIS — Z515 Encounter for palliative care: Secondary | ICD-10-CM | POA: Diagnosis not present

## 2022-12-03 DIAGNOSIS — Z7189 Other specified counseling: Secondary | ICD-10-CM

## 2022-12-03 DIAGNOSIS — G309 Alzheimer's disease, unspecified: Secondary | ICD-10-CM | POA: Diagnosis not present

## 2022-12-03 DIAGNOSIS — G9341 Metabolic encephalopathy: Secondary | ICD-10-CM | POA: Diagnosis not present

## 2022-12-03 DIAGNOSIS — F0284 Dementia in other diseases classified elsewhere, unspecified severity, with anxiety: Secondary | ICD-10-CM

## 2022-12-03 LAB — CBC
HCT: 35 % — ABNORMAL LOW (ref 36.0–46.0)
Hemoglobin: 9.7 g/dL — ABNORMAL LOW (ref 12.0–15.0)
MCH: 30.2 pg (ref 26.0–34.0)
MCHC: 27.7 g/dL — ABNORMAL LOW (ref 30.0–36.0)
MCV: 109 fL — ABNORMAL HIGH (ref 80.0–100.0)
Platelets: 161 10*3/uL (ref 150–400)
RBC: 3.21 MIL/uL — ABNORMAL LOW (ref 3.87–5.11)
RDW: 13.2 % (ref 11.5–15.5)
WBC: 11.8 10*3/uL — ABNORMAL HIGH (ref 4.0–10.5)
nRBC: 0 % (ref 0.0–0.2)

## 2022-12-03 LAB — BASIC METABOLIC PANEL
Anion gap: 6 (ref 5–15)
BUN: 42 mg/dL — ABNORMAL HIGH (ref 8–23)
CO2: 27 mmol/L (ref 22–32)
Calcium: 9.4 mg/dL (ref 8.9–10.3)
Chloride: 113 mmol/L — ABNORMAL HIGH (ref 98–111)
Creatinine, Ser: 1.16 mg/dL — ABNORMAL HIGH (ref 0.44–1.00)
GFR, Estimated: 45 mL/min — ABNORMAL LOW (ref >60)
Glucose, Bld: 99 mg/dL (ref 70–99)
Potassium: 4.7 mmol/L (ref 3.5–5.1)
Sodium: 146 mmol/L — ABNORMAL HIGH (ref 135–145)

## 2022-12-03 LAB — CULTURE, BLOOD (ROUTINE X 2)
Culture: NO GROWTH
Culture: NO GROWTH
Special Requests: ADEQUATE

## 2022-12-03 MED ORDER — VALPROATE SODIUM 100 MG/ML IV SOLN
250.0000 mg | Freq: Three times a day (TID) | INTRAVENOUS | Status: DC
  Administered 2022-12-03 – 2022-12-04 (×3): 250 mg via INTRAVENOUS
  Filled 2022-12-03 (×7): qty 2.5

## 2022-12-03 MED ORDER — DEXTROSE-NACL 5-0.45 % IV SOLN: INTRAVENOUS | Status: DC

## 2022-12-03 NOTE — Evaluation (Signed)
Physical Therapy Evaluation Patient Details Name: [name] [name] MRN: [id number] DOB: 12/10/1929 Today's Date: 12/03/2022  History of Present Illness  [name] [name] is a 89 y.o. female with medical history significant of obesity, hypertension, hyperlipidemia, type 2 diabetes mellitus, GERD, constipation, carotid artery stenosis dementia and many other comorbidities was brought into the emergency department due to decreased responsiveness.  She lives at the nursing home and the staff noticed that she had reduced responsiveness/altered mental status today which is unusual for her.  Patient herself is unable to provide any history as she continues to talk without giving any break and her conversation is unrelated to the questions I asked.  Thus history was obtained from chart review and ED physician.  Reportedly, she was having altered mental status and staff noted strongly smelly urine so they were concerned about possible UTI.  When EMS arrived, her temperature was 102 F at the facility and she was tachycardic but normotensive.  She was brought into the emergency department here.   Clinical Impression  Patient demonstrates slow labored movement for sitting up at bedside, frequent leaning backwards having to hold onto bed rail to prevent loss of balance and unable to stand due to BLE weakness.  Patient will benefit from continued skilled physical therapy in hospital and recommended venue below to increase strength, balance, endurance for safe ADLs and gait.         Recommendations for follow up therapy are one component of a multi-disciplinary discharge planning process, led by the attending physician.  Recommendations may be updated based on patient status, additional functional criteria and insurance authorization.  Follow Up Recommendations Skilled nursing-short term rehab (<3 hours/day) Can patient physically be transported by private vehicle: No    Assistance Recommended at Discharge Intermittent  Supervision/Assistance  Patient can return home with the following  A lot of help with bathing/dressing/bathroom;A lot of help with walking and/or transfers;Help with stairs or ramp for entrance;Assistance with cooking/housework    Equipment Recommendations None recommended by PT  Recommendations for Other Services       Functional Status Assessment Patient has had a recent decline in their functional status and/or demonstrates limited ability to make significant improvements in function in a reasonable and predictable amount of time     Precautions / Restrictions Precautions Precautions: Fall Restrictions Weight Bearing Restrictions: No      Mobility  Bed Mobility Overal bed mobility: Needs Assistance Bed Mobility: Supine to Sit, Sit to Supine     Supine to sit: Max assist Sit to supine: Max assist   General bed mobility comments: slow labored movement, able to use hands to hold onto bed rail    Transfers                        Ambulation/Gait                  Stairs            Wheelchair Mobility    Modified Rankin (Stroke Patients Only)       Balance Overall balance assessment: Needs assistance Sitting-balance support: Feet supported, Bilateral upper extremity supported Sitting balance-Leahy Scale: Poor Sitting balance - Comments: seated at EOB Postural control: Posterior lean                                   Pertinent Vitals/Pain Pain Assessment Pain Assessment: Faces Faces Pain Scale:  Hurts little more Pain Location: BLE with pressure and movement Pain Descriptors / Indicators: Grimacing, Guarding, Sore Pain Intervention(s): Limited activity within patient's tolerance, Monitored during session, Repositioned    Home Living Family/patient expects to be discharged to:: Skilled nursing facility     Type of Home: [place]                  Prior Function Prior Level of Function : Needs  assist       Physical Assist : Mobility (physical);ADLs (physical) Mobility (physical): Bed mobility;Transfers;Gait;Stairs   Mobility Comments: Assisted for transfers to wheelchair at SNF ADLs Comments: assisted by SNF staff     Hand Dominance   Dominant Hand: Right    Extremity/Trunk Assessment   Upper Extremity Assessment Upper Extremity Assessment: Generalized weakness    Lower Extremity Assessment Lower Extremity Assessment: Generalized weakness    Cervical / Trunk Assessment Cervical / Trunk Assessment: Kyphotic  Communication   Communication: Expressive difficulties  Cognition Arousal/Alertness: Awake/alert Behavior During Therapy: Flat affect Overall Cognitive Status: History of cognitive impairments - at baseline                                          General Comments      Exercises     Assessment/Plan    PT Assessment Patient needs continued PT services  PT Problem List Decreased strength;Decreased activity tolerance;Decreased balance;Decreased mobility       PT Treatment Interventions DME instruction;Functional mobility training;Therapeutic activities;Therapeutic exercise;Patient/family education;Wheelchair mobility training;Balance training    PT Goals (Current goals can be found in the Care Plan section)  Acute Rehab PT Goals Patient Stated Goal: not stated PT Goal Formulation: With patient Time For Goal Achievement: 12/17/22 Potential to Achieve Goals: Fair    Frequency Min 2X/week     Co-evaluation               AM-PAC PT "6 Clicks" Mobility  Outcome Measure Help needed turning from your back to your side while in a flat bed without using bedrails?: A Lot Help needed moving from lying on your back to sitting on the side of a flat bed without using bedrails?: A Lot Help needed moving to and from a bed to a chair (including a wheelchair)?: Total Help needed standing up from a chair using your arms (e.g.,  wheelchair or bedside chair)?: Total Help needed to walk in hospital room?: Total Help needed climbing 3-5 steps with a railing? : Total 6 Click Score: 8    End of Session   Activity Tolerance: Patient limited by fatigue Patient left: in bed;with call bell/phone within reach Nurse Communication: Mobility status PT Visit Diagnosis: Unsteadiness on feet (R26.81);Other abnormalities of gait and mobility (R26.89);Muscle weakness (generalized) (M62.81)    Time: 2376-2831 PT Time Calculation (min) (ACUTE ONLY): 21 min   Charges:   PT Evaluation $PT Eval Moderate Complexity: 1 Mod PT Treatments $Therapeutic Activity: 8-22 mins        2:10 PM, 12/03/22 Lonell Grandchild, MPT Physical Therapist with [facility] [phone] office [phone] mobile phone

## 2022-12-03 NOTE — Progress Notes (Signed)
PROGRESS NOTE    [name] [name]  IOM:[id number] DOB: 11-05-1930 DOA: 11/27/2022 PCP: Pcp, No     Brief Narrative:  [name] [name] is a 89 year old female with dementia who resides in a SNF.  She also has diabetes mellitus type 2, hypertension, hyperlipidemia, obesity, GERD, carotid artery stenosis. She was sent from her nursing home for altered mental status and strong smelling urine and a suspicion for UTI.  EMS noted her temperature to be 102 degrees. In the ED, she had tachypnea and tachycardia.  Temperature had improved to 99 degrees. Sodium was 147 and creatinine was 2.71.  WBC count was 15.3, lactic acid was 1.2 and procalcitonin later came back at 1.26. The UA showed many bacteria, moderate leukocytes and was cloudy. Ceftriaxone was given.  New events last 24 hours / Subjective: Alert to voice, shakes head no when asked if she's in pain, does not verbalize    Assessment & Plan:   Principal Problem:   Acute metabolic encephalopathy Active Problems:   Diabetes mellitus type 2, diet-controlled (HCC)   Acute lower UTI   Sepsis ([place])   HTN (hypertension)   Acute renal failure superimposed on stage 4 chronic kidney disease (HCC)   Hyperglycemia due to type 2 diabetes mellitus ([place])   Dementia with anxiety ([place])   Acute metabolic encephalopathy with underlying advanced dementia -At her baseline, patient is alert, seems while sitting in wheelchair -Encephalopathy thought to be secondary to UTI -MRI brain negative -Poor oral intake, palliative care medicine consulted -Discussed with daughter my concerns of her encephalopathy, lack of improvement, poor PO intake. I encouraged her and her daughter (pt's granddaughter) to come see patient in the hospital today. We will need to re-visit discussion for palliative care and hospice if patient continues to exhibit decline.   Sepsis, present on admission, secondary to UTI -Urine culture showed multiple species -Blood culture  negative -Rocephin  Hypernatremia -Initially improved, but now worsened overnight.  Started on D5 half-normal saline  AKI on CKD stage IIIb -Improved  Dementia -Depakote change to IV. Seroquel.    In agreement with assessment of the pressure ulcer as below:  Pressure Injury 12/01/22 Heel Right Deep Tissue Pressure Injury - Purple or maroon localized area of discolored intact skin or blood-filled blister due to damage of underlying soft tissue from pressure and/or shear. (Active)  12/01/22 2115  Location: Heel  Location Orientation: Right  Staging: Deep Tissue Pressure Injury - Purple or maroon localized area of discolored intact skin or blood-filled blister due to damage of underlying soft tissue from pressure and/or shear.  Wound Description (Comments):   Present on Admission: No  Dressing Type Foam - Lift dressing to assess site every shift 12/02/22 1940         DVT prophylaxis:  heparin injection 5,000 Units Start: 11/27/22 1500  Code Status: DNR Family Communication: Discussed with daughter [name] over the phone  Disposition Plan:  Status is: Inpatient Remains inpatient appropriate because: encephalopathy, palliative care consulted   Consultants:  Palliative care   Procedures:  None   Antimicrobials:  Anti-infectives (From admission, onward)    Start     Dose/Rate Route Frequency Ordered Stop   11/27/22 2200  cefTRIAXone (ROCEPHIN) 2 g in sodium chloride 0.9 % 100 mL IVPB        2 g 200 mL/hr over 30 Minutes Intravenous Every 24 hours 11/27/22 1448 12/04/22 2159   11/27/22 1445  piperacillin-tazobactam (ZOSYN) IVPB 3.375 g        3.375 g  100 mL/hr over 30 Minutes Intravenous  Once 11/27/22 1438 11/27/22 1518        Objective: Vitals:   12/02/22 2017 12/02/22 2137 12/03/22 0115 12/03/22 0507  BP: 118/66 (!) 143/66 (!) 134/54 (!) 110/95  Pulse: 71 67 68 73  Resp: 20 18 17 18   Temp: (!) 97.4 F (36.3 C) (!) 96.7 F (35.9 C) 98.2 F (36.8 C) (!) 97.1  F (36.2 C)  TempSrc: Oral     SpO2: 100% 100% 100% 93%  Weight:      Height:        Intake/Output Summary (Last 24 hours) at 12/03/2022 1227 Last data filed at 12/03/2022 1212 Gross per 24 hour  Intake 1542.31 ml  Output 300 ml  Net 1242.31 ml   Filed Weights   11/27/22 1109  Weight: 95 kg    Examination:  General exam: Calm Respiratory system: Clear to auscultation. Respiratory effort normal.  Cardiovascular system: S1 & S2 heard, RRR.  Gastrointestinal system: Abdomen is nondistended, soft and nontender. Normal bowel sounds heard. Central nervous system: Alert to voice, tracks with eyes, remains nonverbal  Extremities: Symmetric in appearance   Data Reviewed: I have personally reviewed following labs and imaging studies  CBC: Recent Labs  Lab 11/27/22 1100 11/28/22 0407 12/02/22 0339 12/03/22 0423  WBC 15.3* 16.0* 13.8* 11.8*  NEUTROABS 11.1*  --   --   --   HGB 10.4* 9.8* 10.8* 9.7*  HCT 34.3* 33.3* 38.6 35.0*  MCV 102.7* 105.4* 111.2* 109.0*  PLT 325 281 195 277   Basic Metabolic Panel: Recent Labs  Lab 11/27/22 1100 11/27/22 1447 11/28/22 0407 12/01/22 0401 12/01/22 1511 12/02/22 0339 12/02/22 1551 12/03/22 0423  NA 146*  --    < > 150* 146* 145 142 146*  K 4.6  --    < > 4.3 4.6 5.2* 4.8 4.7  CL 107  --    < > 114* 112* 111 110 113*  CO2 27  --    < > 27 21* 22 24 27   GLUCOSE 166*  --    < > 173* 183* 159* 135* 99  BUN 100*  --    < > 66* 59* 49* 46* 42*  CREATININE 2.71*  --    < > 1.36* 1.36* 1.31* 1.25* 1.16*  CALCIUM 9.2  --    < > 9.5 9.4 9.3 9.3 9.4  MG 2.8* 2.8*  --   --   --   --   --   --    < > = values in this interval not displayed.   GFR: Estimated Creatinine Clearance: 39.5 mL/min (A) (by C-G formula based on SCr of 1.16 mg/dL (H)). Liver Function Tests: Recent Labs  Lab 11/27/22 1100 11/28/22 0407  AST 93* 47*  ALT 126* 93*  ALKPHOS 146* 117  BILITOT 1.9* 0.6  PROT 8.4* 7.6  ALBUMIN 2.9* 2.6*   No results for  input(s): "LIPASE", "AMYLASE" in the last 168 hours. No results for input(s): "AMMONIA" in the last 168 hours. Coagulation Profile: Recent Labs  Lab 11/28/22 0407  INR 1.3*   Cardiac Enzymes: No results for input(s): "CKTOTAL", "CKMB", "CKMBINDEX", "TROPONINI" in the last 168 hours. BNP (last 3 results) No results for input(s): "PROBNP" in the last 8760 hours. HbA1C: No results for input(s): "HGBA1C" in the last 72 hours. CBG: Recent Labs  Lab 11/27/22 1711 12/02/22 0923 12/02/22 1646 12/02/22 2020  GLUCAP 154* 139* 132* 111*   Lipid Profile: No results for  input(s): "CHOL", "HDL", "LDLCALC", "TRIG", "CHOLHDL", "LDLDIRECT" in the last 72 hours. Thyroid Function Tests: No results for input(s): "TSH", "T4TOTAL", "FREET4", "T3FREE", "THYROIDAB" in the last 72 hours. Anemia Panel: No results for input(s): "VITAMINB12", "FOLATE", "FERRITIN", "TIBC", "IRON", "RETICCTPCT" in the last 72 hours. Sepsis Labs: Recent Labs  Lab 11/27/22 1100 11/27/22 1319 11/28/22 0407  PROCALCITON  --   --  1.26  LATICACIDVEN 1.2 1.2  --     Recent Results (from the past 240 hour(s))  Blood culture (routine x 2)     Status: None (Preliminary result)   Collection Time: 11/27/22 11:00 AM   Specimen: BLOOD  Result Value Ref Range Status   Specimen Description BLOOD RIGHT ANTECUBITAL  Final   Special Requests   Final    BOTTLES DRAWN AEROBIC AND ANAEROBIC Blood Culture results may not be optimal due to an inadequate volume of blood received in culture bottles   Culture   Final    NO GROWTH 4 DAYS Performed at [facility], [address], [place], [place] [address]    Report Status PENDING  Incomplete  Resp panel by RT-PCR (RSV, Flu A&B, Covid) Anterior Nasal Swab     Status: None   Collection Time: 11/27/22 11:36 AM   Specimen: Anterior Nasal Swab  Result Value Ref Range Status   SARS Coronavirus 2 by RT PCR NEGATIVE NEGATIVE Final    Comment: (NOTE) SARS-CoV-2 target nucleic acids are NOT  DETECTED.  The SARS-CoV-2 RNA is generally detectable in upper respiratory specimens during the acute phase of infection. The lowest concentration of SARS-CoV-2 viral copies this assay can detect is 138 copies/mL. A negative result does not preclude SARS-Cov-2 infection and should not be used as the sole basis for treatment or other patient management decisions. A negative result may occur with  improper specimen collection/handling, submission of specimen other than nasopharyngeal swab, presence of viral mutation(s) within the areas targeted by this assay, and inadequate number of viral copies(<138 copies/mL). A negative result must be combined with clinical observations, patient history, and epidemiological information. The expected result is Negative.  Fact Sheet for Patients:  EntrepreneurPulse.com.au  Fact Sheet for Healthcare Providers:  IncredibleEmployment.be  This test is no t yet approved or cleared by the [place] FDA and  has been authorized for detection and/or diagnosis of SARS-CoV-2 by FDA under an Emergency Use Authorization (EUA). This EUA will remain  in effect (meaning this test can be used) for the duration of the COVID-19 declaration under Section 564(b)(1) of the Act, 21 U.S.C.section 360bbb-3(b)(1), unless the authorization is terminated  or revoked sooner.       Influenza A by PCR NEGATIVE NEGATIVE Final   Influenza B by PCR NEGATIVE NEGATIVE Final    Comment: (NOTE) The Xpert Xpress SARS-CoV-2/FLU/RSV plus assay is intended as an aid in the diagnosis of influenza from Nasopharyngeal swab specimens and should not be used as a sole basis for treatment. Nasal washings and aspirates are unacceptable for Xpert Xpress SARS-CoV-2/FLU/RSV testing.  Fact Sheet for Patients: EntrepreneurPulse.com.au  Fact Sheet for Healthcare Providers: IncredibleEmployment.be  This test is not yet  approved or cleared by the [place] FDA and has been authorized for detection and/or diagnosis of SARS-CoV-2 by FDA under an Emergency Use Authorization (EUA). This EUA will remain in effect (meaning this test can be used) for the duration of the COVID-19 declaration under Section 564(b)(1) of the Act, 21 U.S.C. section 360bbb-3(b)(1), unless the authorization is terminated or revoked.  Resp Syncytial Virus by PCR NEGATIVE NEGATIVE Final    Comment: (NOTE) Fact Sheet for Patients: EntrepreneurPulse.com.au  Fact Sheet for Healthcare Providers: IncredibleEmployment.be  This test is not yet approved or cleared by the [place] FDA and has been authorized for detection and/or diagnosis of SARS-CoV-2 by FDA under an Emergency Use Authorization (EUA). This EUA will remain in effect (meaning this test can be used) for the duration of the COVID-19 declaration under Section 564(b)(1) of the Act, 21 U.S.C. section 360bbb-3(b)(1), unless the authorization is terminated or revoked.  Performed at [facility], [address], [place], [place] [address]   Urine Culture     Status: Abnormal   Collection Time: 11/27/22  2:30 PM   Specimen: Urine, Clean Catch  Result Value Ref Range Status   Specimen Description   Final    URINE, CLEAN CATCH Performed at [facility], [address] [place], [place] [address]    Special Requests   Final    NONE Performed at [facility], [address] [place], [place] [address]    Culture MULTIPLE SPECIES PRESENT, SUGGEST RECOLLECTION (A)  Final   Report Status 11/28/2022 FINAL  Final  Blood culture (routine x 2)     Status: None (Preliminary result)   Collection Time: 11/27/22  3:22 PM   Specimen: BLOOD  Result Value Ref Range Status   Specimen Description BLOOD BLOOD RIGHT HAND  Final   Special Requests   Final    BOTTLES DRAWN AEROBIC AND ANAEROBIC Blood Culture adequate volume   Culture   Final    NO  GROWTH 4 DAYS Performed at [facility], [address] [place], [place] [address]    Report Status PENDING  Incomplete      Radiology Studies: MR BRAIN WO CONTRAST  Result Date: 12/02/2022 CLINICAL DATA:  Altered mental status EXAM: MRI HEAD WITHOUT CONTRAST TECHNIQUE: Multiplanar, multiecho pulse sequences of the brain and surrounding structures were obtained without intravenous contrast. COMPARISON:  11/28/2022 FINDINGS: Brain: No restricted diffusion to suggest acute or subacute infarct. No acute hemorrhage, mass, mass effect, or midline shift. No hydrocephalus or extra-axial collection. Normal pituitary and craniocervical junction. No hemosiderin deposition to suggest remote hemorrhage. T2 hyperintense signal in the periventricular white matter, likely the sequela of moderate chronic small vessel ischemic disease. Remote lacunar infarcts in the left greater than right basal ganglia and left thalamus. Vascular: Patent arterial flow voids. Skull and upper cervical spine: Normal marrow signal. Sinuses/Orbits: Clear paranasal sinuses. Remote bilateral lamina papyracea fractures. No acute finding in the orbits. Left scleral buckle. Status post bilateral lens replacements. Other: The mastoid air cells are well aerated. IMPRESSION: No acute intracranial process. No evidence of acute or subacute infarct. Electronically Signed   By: Merilyn Baba M.D.   On: 12/02/2022 17:58      Scheduled Meds:  ascorbic acid  500 mg Oral BID   atorvastatin  20 mg Oral Daily   ferrous sulfate  325 mg Oral Q breakfast   heparin  5,000 Units Subcutaneous Q8H   nystatin  5 mL Oral QID   pantoprazole  40 mg Oral Daily   QUEtiapine  25 mg Oral QHS   zinc sulfate  220 mg Oral Daily   Continuous Infusions:  cefTRIAXone (ROCEPHIN)  IV 200 mL/hr at 12/03/22 0401   dextrose 5 % and 0.45% NaCl 75 mL/hr at 12/03/22 1212   valproate sodium Stopped (12/03/22 1202)     LOS: 6 days   Time spent: 40  minutes   Dessa Phi, DO [facility] 12/03/2022, 12:27 PM   Available via Epic secure chat 7am-7pm After these hours, please refer to coverage provider listed on [email or web address]

## 2022-12-03 NOTE — Plan of Care (Signed)
  Problem: Acute Rehab PT Goals(only PT should resolve) Goal: Pt will Roll Supine to Side Outcome: Progressing Flowsheets (Taken 12/03/2022 1412) Pt will Roll Supine to Side:  with min assist  with mod assist Goal: Pt Will Go Supine/Side To Sit Outcome: Progressing Flowsheets (Taken 12/03/2022 1412) Pt will go Supine/Side to Sit: with moderate assist Goal: Pt Will Go Sit To Supine/Side Outcome: Progressing Flowsheets (Taken 12/03/2022 1412) Pt will go Sit to Supine/Side: with moderate assist Goal: Patient Will Perform Sitting Balance Outcome: Progressing Flowsheets (Taken 12/03/2022 1412) Patient will perform sitting balance: with min guard assist Goal: Patient Will Transfer Sit To/From Stand Outcome: Progressing Flowsheets (Taken 12/03/2022 1412) Patient will transfer sit to/from stand:  with moderate assist  with maximum assist Goal: Pt Will Transfer Bed To Chair/Chair To Bed Outcome: Progressing Flowsheets (Taken 12/03/2022 1412) Pt will Transfer Bed to Chair/Chair to Bed:  with max assist  with mod assist   2:13 PM, 12/03/22 [name] [name], MPT Physical Therapist with [facility] 336 [phone] office [phone] mobile phone

## 2022-12-03 NOTE — Consult Note (Signed)
Consultation Note Date: 12/03/2022   Patient Name: [name] [name]  DOB: [birth year]  MRN: [id number]  Age / Sex: 89 y.o., female  PCP: Pcp, No Referring Physician: Dessa Phi, DO  Reason for Consultation: Establishing goals of care  HPI/Patient Profile: 89 y.o. female  with past medical history of dementia, DM 2, HTN/HLD, obesity, GERD, carotid artery stenosis, residing in a long-term care [facility]) admitted on 11/27/2022 with acute metabolic encephalopathy thought to be secondary to UTI.   Clinical Assessment and Goals of Care: I have reviewed medical records including EPIC notes, labs and imaging, received report from RN, assessed the patient.  [name] is resting quietly in bed.  She wakes easily to voice and touch.  She is able to tell me her name is [name] [name], research (physical sciences).  She cannot tell me where we are.  She has no complaints at this time.  I offer something to drink, and she nods affirmatively.  I hold the cup for her, but she is unable to draw adequately on a straw.  Call to granddaughter, [place] Ouch, left voicemail message. Call to daughter, [name].  Unable to leave message.  Transition of care to is working diligently with patient's family and long-term care facility for return and hospice care discussions.    Conference with attending, bedside nursing staff, transition of care team related to patient condition, needs, goals of care, disposition.   HCPOA NEXT OF KIN    SUMMARY OF RECOMMENDATIONS   At this point continue to treat the treatable but no CPR or intubation Return to long-term care facility, [facility]. Would benefit from "treat the treatable" hospice care   Code Status/Advance Care Planning: DNR  Symptom Management:  Per hospitalist, no additional needs at this time.  Supportive  Palliative Prophylaxis:  Oral Care and Turn Reposition  Additional Recommendations (Limitations,  Scope, Preferences): Continue to treat the treatable but no CPR or intubation  Psycho-social/Spiritual:  Desire for further Chaplaincy support:no Additional Recommendations: Caregiving  Support/Resources and Education on Hospice  Prognosis:  < 6 months, or less would not not be surprising based on chronic illness burden, long-term care placement, expectation of continued functional decline.  Discharge Planning: Anticipate return to long-term care facility, [facility], would benefit from hospice care.      Primary Diagnoses: Present on Admission:  Acute metabolic encephalopathy  Sepsis ([place])  Acute lower UTI  Acute renal failure superimposed on stage 4 chronic kidney disease (HCC)  Dementia with anxiety (HCC)  Hyperglycemia due to type 2 diabetes mellitus ([place])  HTN (hypertension)   I have reviewed the medical record, interviewed the patient and family, and examined the patient. The following aspects are pertinent.  Past Medical History:  Diagnosis Date   Dementia ([place])    Diabetes mellitus    Hyperlipidemia    Hypertension    Social History   Socioeconomic History   Marital status: Widowed    Spouse name: Not on file   Number of children: Not on file   Years of education: Not on file  Highest education level: Not on file  Occupational History   Not on file  Tobacco Use   Smoking status: Never   Smokeless tobacco: Never  Vaping Use   Vaping Use: Never used  Substance and Sexual Activity   Alcohol use: No   Drug use: No   Sexual activity: Not on file  Other Topics Concern   Not on file  Social History Narrative   Not on file   Social Determinants of Health   Financial Resource Strain: Not on file  Food Insecurity: Not on file  Transportation Needs: Not on file  Physical Activity: Not on file  Stress: Not on file  Social Connections: Not on file   Family History  Family history unknown: Yes   Scheduled Meds:  ascorbic acid  500 mg Oral BID    atorvastatin  20 mg Oral Daily   ferrous sulfate  325 mg Oral Q breakfast   heparin  5,000 Units Subcutaneous Q8H   nystatin  5 mL Oral QID   pantoprazole  40 mg Oral Daily   QUEtiapine  25 mg Oral QHS   zinc sulfate  220 mg Oral Daily   Continuous Infusions:  cefTRIAXone (ROCEPHIN)  IV 200 mL/hr at 12/03/22 0401   dextrose 5 % and 0.45% NaCl 75 mL/hr at 12/03/22 1212   valproate sodium Stopped (12/03/22 1202)   PRN Meds:.acetaminophen, polyethylene glycol, polyvinyl alcohol Medications Prior to Admission:  Prior to Admission medications   Medication Sig Start Date End Date Taking? Authorizing Provider  acetaminophen (TYLENOL) 325 MG tablet Take 2 tablets (650 mg total) by mouth every 6 (six) hours as needed for mild pain (or Fever >/= 101). 09/20/21  Yes Nolberto Hanlon, MD  ascorbic acid (VITAMIN C) 500 MG tablet Take 500 mg by mouth 2 (two) times daily.   Yes [provider]  atorvastatin (LIPITOR) 20 MG tablet Take 20 mg by mouth daily. 06/13/21  Yes [provider]  divalproex (DEPAKOTE SPRINKLE) 125 MG capsule Take 250 mg by mouth 3 (three) times daily.   Yes [provider]  ferrous sulfate 325 (65 FE) MG tablet Take 325 mg by mouth daily with breakfast.   Yes [provider]  haloperidol (HALDOL) 0.5 MG tablet Take 1 tablet (0.5 mg total) by mouth 2 (two) times daily as needed for agitation. 05/22/22  Yes Sreenath, Sudheer B, MD  loperamide (IMODIUM) 2 MG capsule Take 1 capsule (2 mg total) by mouth as needed for diarrhea or loose stools (upto 3 times a day). 10/14/21  Yes Pokhrel, Laxman, MD  Multiple Vitamins-Minerals (MULTIVITAMIN WITH MINERALS) tablet Take 1 tablet by mouth daily.   Yes [provider]  nitrofurantoin, macrocrystal-monohydrate, (MACROBID) 100 MG capsule Take 100 mg by mouth daily. 11/18/22  Yes [provider]  pantoprazole (PROTONIX) 40 MG tablet Take 40 mg by mouth daily.   Yes [provider]   polyethylene glycol (MIRALAX / GLYCOLAX) 17 g packet Take 17 g by mouth daily as needed for moderate constipation. 04/18/22  Yes Mariel Aloe, MD  QUEtiapine (SEROQUEL) 25 MG tablet Take 25 mg by mouth at bedtime.   Yes [provider]  Vitamin D, Ergocalciferol, (DRISDOL) 1.25 MG (50000 UNIT) CAPS capsule Take 50,000 Units by mouth every Friday. 06/06/21  Yes [provider]  Zinc Sulfate 220 (50 Zn) MG TABS Take 1 tablet by mouth daily.   Yes [provider]   Allergies  Allergen Reactions   Alendronate  Other reaction(s): Other (See Comments) Poor renal function.   Review of Systems  Unable to perform ROS: Mental status change    Physical Exam Vitals and nursing note reviewed.  Constitutional:      General: She is not in acute distress.    Appearance: She is obese. She is ill-appearing.  Cardiovascular:     Rate and Rhythm: Normal rate.  Pulmonary:     Effort: Pulmonary effort is normal. No respiratory distress.  Skin:    General: Skin is warm and dry.  Neurological:     Mental Status: She is alert.     Vital Signs: BP (!) 110/95 (BP Location: Left Arm)   Pulse 73   Temp (!) 97.1 F (36.2 C)   Resp 18   Ht 5\' 9"  (1.753 m)   Wt 95 kg   SpO2 93%   BMI 30.93 kg/m  Pain Scale: 0-10   Pain Score: 0-No pain   SpO2: SpO2: 93 % O2 Device:SpO2: 93 % O2 Flow Rate: .O2 Flow Rate (L/min): 2 L/min  IO: Intake/output summary:  Intake/Output Summary (Last 24 hours) at 12/03/2022 1306 Last data filed at 12/03/2022 1212 Gross per 24 hour  Intake 1542.31 ml  Output 300 ml  Net 1242.31 ml    LBM: Last BM Date :  (utd) Baseline Weight: Weight: 95 kg Most recent weight: Weight: 95 kg     Palliative Assessment/Data:     Time In: 0820 Time Out: 0915 Time Total: 55 minutes  Greater than 50%  of this time was spent counseling and coordinating care related to the above assessment and plan.  Signed by: Drue Novel, NP   Please contact  Palliative Medicine Team phone at [phone] for questions and concerns.  For individual provider: See Shea Evans

## 2022-12-03 NOTE — TOC Progression Note (Signed)
Transition of Care [facility]) - Progression Note    Patient Details  Name: [name] [name] MRN: [id number] Date of Birth: June 26, 1930  Transition of Care [facility]) CM/SW Contact  Boneta Lucks, RN Phone Number: 12/03/2022, 1:38 PM  Clinical Narrative:   CM spoke with [facility] again today, Patient is LTC. [facility] prefers to use [facility] for palliative and Hospice and will accept her back to facility. Palliative consulted today, Aniceto Boss will continue discussion with family tomorrow. Daughter believes the patient is getting better.  Expected Discharge Plan: [place] Barriers to Discharge: Continued Medical Work up  Expected Discharge Plan and Services In-house Referral: Clinical Social Work Discharge Planning Services: CM Consult Post Acute Care Choice: Nursing Home Living arrangements for the past 2 months: [place]                   Social Determinants of Health (SDOH) Interventions SDOH Screenings   Tobacco Use: Low Risk  (12/03/2022)    Readmission Risk Interventions    11/28/2022    1:29 PM 04/17/2022   12:10 PM 09/12/2021    2:32 PM  Readmission Risk Prevention Plan  Transportation Screening Complete Complete Complete  HRI or Home Care Consult Complete    Social Work Consult for [place] Planning/Counseling Complete    Palliative Care Screening Not Applicable    Medication Review Press photographer) Complete  Complete  HRI or Home Care Consult  Complete   SW Recovery Care/Counseling Consult  Complete Complete  Palliative Care Screening  Complete Complete  Skilled Nursing Facility  Complete Complete

## 2022-12-04 DIAGNOSIS — Z7189 Other specified counseling: Secondary | ICD-10-CM | POA: Diagnosis not present

## 2022-12-04 DIAGNOSIS — G9341 Metabolic encephalopathy: Secondary | ICD-10-CM | POA: Diagnosis not present

## 2022-12-04 DIAGNOSIS — Z515 Encounter for palliative care: Secondary | ICD-10-CM | POA: Diagnosis not present

## 2022-12-04 LAB — CBC
HCT: 32 % — ABNORMAL LOW (ref 36.0–46.0)
Hemoglobin: 9.2 g/dL — ABNORMAL LOW (ref 12.0–15.0)
MCH: 30.8 pg (ref 26.0–34.0)
MCHC: 28.8 g/dL — ABNORMAL LOW (ref 30.0–36.0)
MCV: 107 fL — ABNORMAL HIGH (ref 80.0–100.0)
Platelets: 201 10*3/uL (ref 150–400)
RBC: 2.99 MIL/uL — ABNORMAL LOW (ref 3.87–5.11)
RDW: 13.1 % (ref 11.5–15.5)
WBC: 10.2 10*3/uL (ref 4.0–10.5)
nRBC: 0 % (ref 0.0–0.2)

## 2022-12-04 LAB — BASIC METABOLIC PANEL
Anion gap: 9 (ref 5–15)
BUN: 34 mg/dL — ABNORMAL HIGH (ref 8–23)
CO2: 23 mmol/L (ref 22–32)
Calcium: 9.1 mg/dL (ref 8.9–10.3)
Chloride: 111 mmol/L (ref 98–111)
Creatinine, Ser: 1.1 mg/dL — ABNORMAL HIGH (ref 0.44–1.00)
GFR, Estimated: 48 mL/min — ABNORMAL LOW (ref >60)
Glucose, Bld: 127 mg/dL — ABNORMAL HIGH (ref 70–99)
Potassium: 4.5 mmol/L (ref 3.5–5.1)
Sodium: 143 mmol/L (ref 135–145)

## 2022-12-04 NOTE — TOC Transition Note (Addendum)
Transition of Care [facility]) - CM/SW Discharge Note   Patient Details  Name: [name] [name] MRN: [id number] Date of Birth: Aug 24, 1930  Transition of Care [facility]) CM/SW Contact:  Salome Arnt, [place] Phone Number: 12/04/2022, 1:11 PM   Clinical Narrative:  Pt d/c today back to [facility]. Facility notified. Pt's daughter, [name] aware and agreeable. Requests EMS transport. TOC to arrange. D/C summary sent to SNF. RN given number to call report.   Palliative requested that SNF continue palliative conversations and make referral if family is agreeable. SNF updated.     Final next level of care: Skilled Nursing Facility Barriers to Discharge: Barriers Resolved   Patient Goals and CMS Choice CMS Medicare.gov Compare Post Acute Care list provided to:: Patient Represenative (must comment)    Discharge Placement                  Patient to be transferred to facility by: [facility] Name of family member notified: [name]- daughter Patient and family notified of of transfer: 12/04/22  Discharge Plan and Services Additional resources added to the After Visit Summary for   In-house Referral: Clinical Social Work Discharge Planning Services: CM Consult Post Acute Care Choice: Nursing Home                               Social Determinants of Health (SDOH) Interventions SDOH Screenings   Tobacco Use: Low Risk  (12/03/2022)     Readmission Risk Interventions    11/28/2022    1:29 PM 04/17/2022   12:10 PM 09/12/2021    2:32 PM  Readmission Risk Prevention Plan  Transportation Screening Complete Complete Complete  HRI or [place] Complete    Social Work Consult for [place] Planning/Counseling Complete    Palliative Care Screening Not Applicable    Medication Review Press photographer) Complete  Complete  HRI or Home Care Consult  Complete   SW Recovery Care/Counseling Consult  Complete Complete  Palliative Care Screening  Complete Complete  Skilled  Nursing Facility  Complete Complete

## 2022-12-04 NOTE — Care Management Important Message (Signed)
Important Message  Patient Details  Name: [name] [name] MRN: [id number] Date of Birth: 07-09-1930   Medicare Important Message Given:  Yes     Tommy Medal 12/04/2022, 11:49 AM

## 2022-12-04 NOTE — Progress Notes (Addendum)
Palliative: [name] [name] is resting quietly in bed.  She appears acutely/chronically ill and somewhat frail.  She is resting comfortably, but wakes easily she can make her basic needs known unless asked.  There is no family at bedside at this time.   Call to granddaughter, [place] Ouch.  Left voicemail message.   Call to daughter, [name].  No answer, unable to leave voicemail message.  Conference with attending, bedside nursing staff, transition of care team related to patient condition, needs, goals of care, disposition.  Plan:   Continue to treat the treatable but no CPR or intubation.  Return to [facility] under long-term care, may be skillable.  Would benefit from outpatient palliative/hospice care, long-term care to assist family.  25 minutes  Quinn Axe, NP Palliative medicine team Team phone [phone] Greater than 50% of this time was spent counseling and coordinating care related to the above assessment and plan.

## 2022-12-04 NOTE — Discharge Summary (Signed)
Physician Discharge Summary  Ayde Record KGY:[id number] DOB: 08-28-1930 DOA: 11/27/2022  PCP: Pcp, No  Admit date: 11/27/2022 Discharge date: 12/04/2022  Admitted From: SNF Disposition:  SNF  Recommendations for Outpatient Follow-up:  Recommend outpatient palliative care/hospice   Discharge Condition: Stable but overall poor prognosis  CODE STATUS: DNR  Diet recommendation:  Diet Orders (From admission, onward)     Start     Ordered   12/04/22 0000  Diet - low sodium heart healthy        12/04/22 1238   11/27/22 1448  Diet heart healthy/carb modified Room service appropriate? Yes; Fluid consistency: Thin  Diet effective now       Question Answer Comment  Diet-HS Snack? Nothing   Room service appropriate? Yes   Fluid consistency: Thin      11/27/22 1448           Brief/Interim Summary: [name] [name] is a 89 year old female with dementia who resides in a SNF.  She also has diabetes mellitus type 2, hypertension, hyperlipidemia, obesity, GERD, carotid artery stenosis. She was sent from her nursing home for altered mental status and strong smelling urine and a suspicion for UTI.  EMS noted her temperature to be 102 degrees. In the ED, she had tachypnea and tachycardia.  Temperature had improved to 99 degrees. Sodium was 147 and creatinine was 2.71.  WBC count was 15.3, lactic acid was 1.2 and procalcitonin later came back at 1.26. The UA showed many bacteria, moderate leukocytes and was cloudy. Ceftriaxone was given. Urine culture revealed multiple species. Due to continued encephalopathy, MRI brain was obtained which was negative for acute findings. She had general poor PO intake, palliative care was consulted. Recommended for palliative care hospice at her long-term care facility.    Discharge Diagnoses:   Principal Problem:   Acute metabolic encephalopathy Active Problems:   Diabetes mellitus type 2, diet-controlled (HCC)   Acute lower UTI   Sepsis ([place])   HTN (hypertension)    Acute renal failure superimposed on stage 4 chronic kidney disease (HCC)   Hyperglycemia due to type 2 diabetes mellitus ([place])   Dementia with anxiety ([place])   Acute metabolic encephalopathy with underlying advanced dementia -At her baseline, patient is alert, sings while sitting in wheelchair -Encephalopathy thought to be secondary to UTI -MRI brain negative -Poor oral intake, palliative care medicine consulted   Sepsis, present on admission, secondary to UTI -Urine culture showed multiple species -Blood culture negative -Completed 7 days of rocephin    Hypernatremia -Improved    AKI on CKD stage IIIb -Improved   Dementia -Depakote   In agreement with assessment of the pressure ulcer as below:  Pressure Injury 12/01/22 Heel Right Deep Tissue Pressure Injury - Purple or maroon localized area of discolored intact skin or blood-filled blister due to damage of underlying soft tissue from pressure and/or shear. (Active)  12/01/22 2115  Location: Heel  Location Orientation: Right  Staging: Deep Tissue Pressure Injury - Purple or maroon localized area of discolored intact skin or blood-filled blister due to damage of underlying soft tissue from pressure and/or shear.  Wound Description (Comments):   Present on Admission: No  Dressing Type Petroleum;Gauze (Comment);Foam - Lift dressing to assess site every shift 12/04/22 0955       Discharge Instructions  Discharge Instructions     Diet - low sodium heart healthy   Complete by: As directed    Discharge wound care:   Complete by: As directed    Right  heel: Apply Xeroform gauze to right heel Q day, then cover with foam dressing.  (Change foam dressing Q 3 days or PRN soiling.)   Increase activity slowly   Complete by: As directed       Allergies as of 12/04/2022       Reactions   Alendronate    Other reaction(s): Other (See Comments) Poor renal function.        Medication List     STOP taking these medications     nitrofurantoin (macrocrystal-monohydrate) 100 MG capsule Commonly known as: MACROBID       TAKE these medications    acetaminophen 325 MG tablet Commonly known as: TYLENOL Take 2 tablets (650 mg total) by mouth every 6 (six) hours as needed for mild pain (or Fever >/= 101).   ascorbic acid 500 MG tablet Commonly known as: VITAMIN C Take 500 mg by mouth 2 (two) times daily.   atorvastatin 20 MG tablet Commonly known as: LIPITOR Take 20 mg by mouth daily.   divalproex 125 MG capsule Commonly known as: DEPAKOTE SPRINKLE Take 250 mg by mouth 3 (three) times daily.   ferrous sulfate 325 (65 FE) MG tablet Take 325 mg by mouth daily with breakfast.   haloperidol 0.5 MG tablet Commonly known as: HALDOL Take 1 tablet (0.5 mg total) by mouth 2 (two) times daily as needed for agitation.   loperamide 2 MG capsule Commonly known as: IMODIUM Take 1 capsule (2 mg total) by mouth as needed for diarrhea or loose stools (upto 3 times a day).   multivitamin with minerals tablet Take 1 tablet by mouth daily.   pantoprazole 40 MG tablet Commonly known as: PROTONIX Take 40 mg by mouth daily.   polyethylene glycol 17 g packet Commonly known as: MIRALAX / GLYCOLAX Take 17 g by mouth daily as needed for moderate constipation.   QUEtiapine 25 MG tablet Commonly known as: SEROQUEL Take 25 mg by mouth at bedtime.   Vitamin D (Ergocalciferol) 1.25 MG (50000 UNIT) Caps capsule Commonly known as: DRISDOL Take 50,000 Units by mouth every Friday.   Zinc Sulfate 220 (50 Zn) MG Tabs Take 1 tablet by mouth daily.               Discharge Care Instructions  (From admission, onward)           Start     Ordered   12/04/22 0000  Discharge wound care:       Comments: Right heel: Apply Xeroform gauze to right heel Q day, then cover with foam dressing.  (Change foam dressing Q 3 days or PRN soiling.)   12/04/22 1238            Follow-up Information     pcp Follow up in 1  week(s).                 Allergies  Allergen Reactions   Alendronate     Other reaction(s): Other (See Comments) Poor renal function.      Procedures/Studies: MR BRAIN WO CONTRAST  Result Date: 12/02/2022 CLINICAL DATA:  Altered mental status EXAM: MRI HEAD WITHOUT CONTRAST TECHNIQUE: Multiplanar, multiecho pulse sequences of the brain and surrounding structures were obtained without intravenous contrast. COMPARISON:  11/28/2022 FINDINGS: Brain: No restricted diffusion to suggest acute or subacute infarct. No acute hemorrhage, mass, mass effect, or midline shift. No hydrocephalus or extra-axial collection. Normal pituitary and craniocervical junction. No hemosiderin deposition to suggest remote hemorrhage. T2 hyperintense signal in the periventricular  white matter, likely the sequela of moderate chronic small vessel ischemic disease. Remote lacunar infarcts in the left greater than right basal ganglia and left thalamus. Vascular: Patent arterial flow voids. Skull and upper cervical spine: Normal marrow signal. Sinuses/Orbits: Clear paranasal sinuses. Remote bilateral lamina papyracea fractures. No acute finding in the orbits. Left scleral buckle. Status post bilateral lens replacements. Other: The mastoid air cells are well aerated. IMPRESSION: No acute intracranial process. No evidence of acute or subacute infarct. Electronically Signed   By: Merilyn Baba M.D.   On: 12/02/2022 17:58   MR BRAIN WO CONTRAST  Result Date: 11/28/2022 CLINICAL DATA:  Provided history: Neuro deficit, acute, stroke suspected. Altered mental status. EXAM: MRI HEAD WITHOUT CONTRAST TECHNIQUE: Multiplanar, multiecho pulse sequences of the brain and surrounding structures were obtained without intravenous contrast. COMPARISON:  Prior head CT examinations 11/27/2022 and earlier. Brain MRI 12/14/2003. FINDINGS: Brain: Moderate to advanced cerebral atrophy. Mild cerebellar atrophy. Multifocal T2 FLAIR hyperintense  signal abnormality within the cerebral white matter, nonspecific but compatible with moderate chronic small vessel ischemic disease. Chronic lacunar infarcts within the bilateral deep gray nuclei and left internal capsule. Multiple tiny chronic infarcts within the bilateral cerebellar hemispheres. There is no acute infarct. No evidence of an intracranial mass. No chronic intracranial blood products. No extra-axial fluid collection. No midline shift. Vascular: Maintained flow voids within the proximal large arterial vessels. Skull and upper cervical spine: No focal suspicious marrow lesion. Incompletely assessed cervical spondylosis. Sinuses/Orbits: No mass or acute finding within the imaged orbits. Prior bilateral ocular lens replacement. Left scleral buckle. Chronic depression deformities of the bilateral lamina papyracea. No significant paranasal sinus disease. IMPRESSION: 1. No evidence of acute intracranial abnormality. 2. Moderate chronic small vessel ischemic changes within the cerebral white matter, progressed from the prior brain MRI of 12/14/2003. 3. Chronic lacunar infarcts within the bilateral deep gray nuclei and left internal capsule, some of which are new from the prior MRI. 4. Multiple tiny chronic infarcts in the bilateral cerebellar hemispheres, new from the prior MRI. 5. Moderate-to-advanced generalized cerebral atrophy, progressed. 6. Mild cerebellar atrophy. Electronically Signed   By: Kellie Simmering D.O.   On: 11/28/2022 16:22   DG Abd 1 View  Result Date: 11/28/2022 CLINICAL DATA:  Rule out foreign body for MRI clearance EXAM: ABDOMEN - 1 VIEW; PELVIS - 1-2 VIEW COMPARISON:  Abdominal radiograph October 01, 2021 FINDINGS: The bowel gas pattern is normal. Moderate colonic stool burden. Surgical clips projecting over the right upper quadrant. No metallic foreign body. Diffuse osseous demineralization. Overlying bowel gas partially obscures osseous detail of the sacrum. No acute osseous  abnormality. Visualized bibasilar lungs clear. IMPRESSION: 1. No metallic foreign body identified. 2. Normal bowel-gas pattern with moderate colonic stool burden. Electronically Signed   By: Beryle Flock M.D.   On: 11/28/2022 14:56   DG Pelvis 1-2 Views  Result Date: 11/28/2022 CLINICAL DATA:  Rule out foreign body for MRI clearance EXAM: ABDOMEN - 1 VIEW; PELVIS - 1-2 VIEW COMPARISON:  Abdominal radiograph October 01, 2021 FINDINGS: The bowel gas pattern is normal. Moderate colonic stool burden. Surgical clips projecting over the right upper quadrant. No metallic foreign body. Diffuse osseous demineralization. Overlying bowel gas partially obscures osseous detail of the sacrum. No acute osseous abnormality. Visualized bibasilar lungs clear. IMPRESSION: 1. No metallic foreign body identified. 2. Normal bowel-gas pattern with moderate colonic stool burden. Electronically Signed   By: Beryle Flock M.D.   On: 11/28/2022 14:56   CT Head Wo  Contrast  Result Date: 11/27/2022 CLINICAL DATA:  Neuro deficit, acute, stroke suspected Mental status change, unknown cause EXAM: CT HEAD WITHOUT CONTRAST TECHNIQUE: Contiguous axial images were obtained from the base of the skull through the vertex without intravenous contrast. RADIATION DOSE REDUCTION: This exam was performed according to the departmental dose-optimization program which includes automated exposure control, adjustment of the mA and/or kV according to patient size and/or use of iterative reconstruction technique. COMPARISON:  CT head Apr 05, 2022. FINDINGS: Brain: Remote bilateral basal ganglia lacunar infarcts. No evidence of acute large vascular territory infarct. Additional patchy white matter hypodensities, nonspecific but compatible with chronic microvascular ischemic disease. Cerebral atrophy. No evidence of acute hemorrhage, mass lesion or midline shift. Vascular: No hyperdense vessel identified. Skull: No acute fracture. Sinuses/Orbits:  Remote bilateral medial orbital wall fractures. No acute findings. Other: No mastoid effusions. IMPRESSION: 1. No evidence of acute intracranial abnormality. 2. Chronic microvascular ischemic disease and remote bilateral basal ganglia infarcts. Electronically Signed   By: Margaretha [place] M.D.   On: 11/27/2022 13:09   DG Chest 1 View  Result Date: 11/27/2022 CLINICAL DATA:  Altered mental status EXAM: CHEST  1 VIEW COMPARISON:  05/15/2022 FINDINGS: Upper normal heart size. Mediastinal contours and pulmonary vascularity normal. Atherosclerotic calcification aorta. Decreased lung volumes with bibasilar atelectasis. No definite infiltrate, pleural effusion, or pneumothorax. Osseous structures unremarkable. IMPRESSION: Bibasilar atelectasis without acute infiltrate. Aortic Atherosclerosis (ICD10-I70.0). Electronically Signed   By: Lavonia Dana M.D.   On: 11/27/2022 13:07       Discharge Exam: Vitals:   12/03/22 2150 12/04/22 0536  BP: (!) 129/59 107/63  Pulse: (!) 55 (!) 48  Resp: 16 20  Temp: (!) 96.6 F (35.9 C) 97.8 F (36.6 C)  SpO2: 100% 100%    General: Pt is alert, awake to voice, calm  Cardiovascular: Bradycardic, regular, S1/S2 +, no edema Respiratory: CTA bilaterally, no wheezing, no rhonchi, no respiratory distress Abdominal: Soft, NT, ND, bowel sounds + Extremities: no edema, no cyanosis CNS: Alert and oriented to person and "hospital" and "[place]" but not to year. Able to follow simple commands to wiggle both hands and feet but does not follow other commands or answer any questions    The results of significant diagnostics from this hospitalization (including imaging, microbiology, ancillary and laboratory) are listed below for reference.     Microbiology: Recent Results (from the past 240 hour(s))  Blood culture (routine x 2)     Status: None   Collection Time: 11/27/22 11:00 AM   Specimen: BLOOD  Result Value Ref Range Status   Specimen Description   Final     BLOOD RIGHT ANTECUBITAL Performed at [facility], [address], [place], [place] [address]    Special Requests   Final    BOTTLES DRAWN AEROBIC AND ANAEROBIC Blood Culture results may not be optimal due to an inadequate volume of blood received in culture bottles Performed at [facility], [address], [place], [place] [address]    Culture   Final    NO GROWTH 6 DAYS Performed at [facility], [place] [address], [place], [place] [address]    Report Status 12/03/2022 FINAL  Final  Resp panel by RT-PCR (RSV, Flu A&B, Covid) Anterior Nasal Swab     Status: None   Collection Time: 11/27/22 11:36 AM   Specimen: Anterior Nasal Swab  Result Value Ref Range Status   SARS Coronavirus 2 by RT PCR NEGATIVE NEGATIVE Final    Comment: (  NOTE) SARS-CoV-2 target nucleic acids are NOT DETECTED.  The SARS-CoV-2 RNA is generally detectable in upper respiratory specimens during the acute phase of infection. The lowest concentration of SARS-CoV-2 viral copies this assay can detect is 138 copies/mL. A negative result does not preclude SARS-Cov-2 infection and should not be used as the sole basis for treatment or other patient management decisions. A negative result may occur with  improper specimen collection/handling, submission of specimen other than nasopharyngeal swab, presence of viral mutation(s) within the areas targeted by this assay, and inadequate number of viral copies(<138 copies/mL). A negative result must be combined with clinical observations, patient history, and epidemiological information. The expected result is Negative.  Fact Sheet for Patients:  EntrepreneurPulse.com.au  Fact Sheet for Healthcare Providers:  IncredibleEmployment.be  This test is no t yet approved or cleared by the [place] FDA and  has been authorized for detection and/or diagnosis of SARS-CoV-2 by FDA under an Emergency Use Authorization (EUA). This EUA will  remain  in effect (meaning this test can be used) for the duration of the COVID-19 declaration under Section 564(b)(1) of the Act, 21 U.S.C.section 360bbb-3(b)(1), unless the authorization is terminated  or revoked sooner.       Influenza A by PCR NEGATIVE NEGATIVE Final   Influenza B by PCR NEGATIVE NEGATIVE Final    Comment: (NOTE) The Xpert Xpress SARS-CoV-2/FLU/RSV plus assay is intended as an aid in the diagnosis of influenza from Nasopharyngeal swab specimens and should not be used as a sole basis for treatment. Nasal washings and aspirates are unacceptable for Xpert Xpress SARS-CoV-2/FLU/RSV testing.  Fact Sheet for Patients: EntrepreneurPulse.com.au  Fact Sheet for Healthcare Providers: IncredibleEmployment.be  This test is not yet approved or cleared by the [place] FDA and has been authorized for detection and/or diagnosis of SARS-CoV-2 by FDA under an Emergency Use Authorization (EUA). This EUA will remain in effect (meaning this test can be used) for the duration of the COVID-19 declaration under Section 564(b)(1) of the Act, 21 U.S.C. section 360bbb-3(b)(1), unless the authorization is terminated or revoked.     Resp Syncytial Virus by PCR NEGATIVE NEGATIVE Final    Comment: (NOTE) Fact Sheet for Patients: EntrepreneurPulse.com.au  Fact Sheet for Healthcare Providers: IncredibleEmployment.be  This test is not yet approved or cleared by the [place] FDA and has been authorized for detection and/or diagnosis of SARS-CoV-2 by FDA under an Emergency Use Authorization (EUA). This EUA will remain in effect (meaning this test can be used) for the duration of the COVID-19 declaration under Section 564(b)(1) of the Act, 21 U.S.C. section 360bbb-3(b)(1), unless the authorization is terminated or revoked.  Performed at [facility], [address], [place], [place] [address]   Urine  Culture     Status: Abnormal   Collection Time: 11/27/22  2:30 PM   Specimen: Urine, Clean Catch  Result Value Ref Range Status   Specimen Description   Final    URINE, CLEAN CATCH Performed at [facility], [address], [place], [place] [address]    Special Requests   Final    NONE Performed at [facility], [address], [place],  [address]    Culture MULTIPLE SPECIES PRESENT, SUGGEST RECOLLECTION (A)  Final   Report Status 11/28/2022 FINAL  Final  Blood culture (routine x 2)     Status: None   Collection Time: 11/27/22  3:22 PM   Specimen: BLOOD  Result Value Ref Range Status   Specimen Description   Final  BLOOD BLOOD RIGHT HAND Performed at [facility], [address], [place], [place] [address]    Special Requests   Final    BOTTLES DRAWN AEROBIC AND ANAEROBIC Blood Culture adequate volume Performed at [facility], [address], [place], [place] [address]    Culture   Final    NO GROWTH 6 DAYS Performed at [facility], [place] [address] [place], [place] [address]    Report Status 12/03/2022 FINAL  Final     Labs: BNP (last 3 results) No results for input(s): "BNP" in the last 8760 hours. Basic Metabolic Panel: Recent Labs  Lab 11/27/22 1447 11/28/22 0407 12/01/22 1511 12/02/22 0339 12/02/22 1551 12/03/22 0423 12/04/22 0409  NA  --    < > 146* 145 142 146* 143  K  --    < > 4.6 5.2* 4.8 4.7 4.5  CL  --    < > 112* 111 110 113* 111  CO2  --    < > 21* 22 24 27 23   GLUCOSE  --    < > 183* 159* 135* 99 127*  BUN  --    < > 59* 49* 46* 42* 34*  CREATININE  --    < > 1.36* 1.31* 1.25* 1.16* 1.10*  CALCIUM  --    < > 9.4 9.3 9.3 9.4 9.1  MG 2.8*  --   --   --   --   --   --    < > = values in this interval not displayed.   Liver Function Tests: Recent Labs  Lab 11/28/22 0407  AST 47*  ALT 93*  ALKPHOS 117  BILITOT 0.6  PROT 7.6  ALBUMIN 2.6*   No results for input(s): "LIPASE", "AMYLASE" in the last 168 hours. No results for  input(s): "AMMONIA" in the last 168 hours. CBC: Recent Labs  Lab 11/28/22 0407 12/02/22 0339 12/03/22 0423 12/04/22 0409  WBC 16.0* 13.8* 11.8* 10.2  HGB 9.8* 10.8* 9.7* 9.2*  HCT 33.3* 38.6 35.0* 32.0*  MCV 105.4* 111.2* 109.0* 107.0*  PLT 281 195 161 201   Cardiac Enzymes: No results for input(s): "CKTOTAL", "CKMB", "CKMBINDEX", "TROPONINI" in the last 168 hours. BNP: Invalid input(s): "POCBNP" CBG: Recent Labs  Lab 11/27/22 1711 12/02/22 0923 12/02/22 1646 12/02/22 2020  GLUCAP 154* 139* 132* 111*   D-Dimer No results for input(s): "DDIMER" in the last 72 hours. Hgb A1c No results for input(s): "HGBA1C" in the last 72 hours. Lipid Profile No results for input(s): "CHOL", "HDL", "LDLCALC", "TRIG", "CHOLHDL", "LDLDIRECT" in the last 72 hours. Thyroid function studies No results for input(s): "TSH", "T4TOTAL", "T3FREE", "THYROIDAB" in the last 72 hours.  Invalid input(s): "FREET3" Anemia work up No results for input(s): "VITAMINB12", "FOLATE", "FERRITIN", "TIBC", "IRON", "RETICCTPCT" in the last 72 hours. Urinalysis    Component Value Date/Time   COLORURINE YELLOW 11/27/2022 1100   APPEARANCEUR CLOUDY (A) 11/27/2022 1100   LABSPEC 1.010 11/27/2022 1100   PHURINE 8.0 11/27/2022 1100   GLUCOSEU NEGATIVE 11/27/2022 1100   HGBUR NEGATIVE 11/27/2022 1100   BILIRUBINUR SMALL (A) 11/27/2022 1100   KETONESUR NEGATIVE 11/27/2022 1100   PROTEINUR 100 (A) 11/27/2022 1100   UROBILINOGEN 1.0 12/30/2014 1333   NITRITE NEGATIVE 11/27/2022 1100   LEUKOCYTESUR MODERATE (A) 11/27/2022 1100   Sepsis Labs Recent Labs  Lab 11/28/22 0407 12/02/22 0339 12/03/22 0423 12/04/22 0409  WBC 16.0* 13.8* 11.8* 10.2   Microbiology Recent Results (from the past 240 hour(s))  Blood culture (  routine x 2)     Status: None   Collection Time: 11/27/22 11:00 AM   Specimen: BLOOD  Result Value Ref Range Status   Specimen Description   Final    BLOOD RIGHT ANTECUBITAL Performed at  [facility], [address], [place], [place] [address]    Special Requests   Final    BOTTLES DRAWN AEROBIC AND ANAEROBIC Blood Culture results may not be optimal due to an inadequate volume of blood received in culture bottles Performed at [facility], [address], [place], [place] [address]    Culture   Final    NO GROWTH 6 DAYS Performed at [facility] [address] [place], [place] [address]    Report Status 12/03/2022 FINAL  Final  Resp panel by RT-PCR (RSV, Flu A&B, Covid) Anterior Nasal Swab     Status: None   Collection Time: 11/27/22 11:36 AM   Specimen: Anterior Nasal Swab  Result Value Ref Range Status   SARS Coronavirus 2 by RT PCR NEGATIVE NEGATIVE Final    Comment: (NOTE) SARS-CoV-2 target nucleic acids are NOT DETECTED.  The SARS-CoV-2 RNA is generally detectable in upper respiratory specimens during the acute phase of infection. The lowest concentration of SARS-CoV-2 viral copies this assay can detect is 138 copies/mL. A negative result does not preclude SARS-Cov-2 infection and should not be used as the sole basis for treatment or other patient management decisions. A negative result may occur with  improper specimen collection/handling, submission of specimen other than nasopharyngeal swab, presence of viral mutation(s) within the areas targeted by this assay, and inadequate number of viral copies(<138 copies/mL). A negative result must be combined with clinical observations, patient history, and epidemiological information. The expected result is Negative.  Fact Sheet for Patients:  EntrepreneurPulse.com.au  Fact Sheet for Healthcare Providers:  IncredibleEmployment.be  This test is no t yet approved or cleared by the [place] FDA and  has been authorized for detection and/or diagnosis of SARS-CoV-2 by FDA under an Emergency Use Authorization (EUA). This EUA will remain  in effect (meaning this test can  be used) for the duration of the COVID-19 declaration under Section 564(b)(1) of the Act, 21 U.S.C.section 360bbb-3(b)(1), unless the authorization is terminated  or revoked sooner.       Influenza A by PCR NEGATIVE NEGATIVE Final   Influenza B by PCR NEGATIVE NEGATIVE Final    Comment: (NOTE) The Xpert Xpress SARS-CoV-2/FLU/RSV plus assay is intended as an aid in the diagnosis of influenza from Nasopharyngeal swab specimens and should not be used as a sole basis for treatment. Nasal washings and aspirates are unacceptable for Xpert Xpress SARS-CoV-2/FLU/RSV testing.  Fact Sheet for Patients: EntrepreneurPulse.com.au  Fact Sheet for Healthcare Providers: IncredibleEmployment.be  This test is not yet approved or cleared by the [place] FDA and has been authorized for detection and/or diagnosis of SARS-CoV-2 by FDA under an Emergency Use Authorization (EUA). This EUA will remain in effect (meaning this test can be used) for the duration of the COVID-19 declaration under Section 564(b)(1) of the Act, 21 U.S.C. section 360bbb-3(b)(1), unless the authorization is terminated or revoked.     Resp Syncytial Virus by PCR NEGATIVE NEGATIVE Final    Comment: (NOTE) Fact Sheet for Patients: EntrepreneurPulse.com.au  Fact Sheet for Healthcare Providers: IncredibleEmployment.be  This test is not yet approved or cleared by the [place] FDA and has been authorized for detection and/or diagnosis of SARS-CoV-2 by FDA under an Emergency Use Authorization (EUA).  This EUA will remain in effect (meaning this test can be used) for the duration of the COVID-19 declaration under Section 564(b)(1) of the Act, 21 U.S.C. section 360bbb-3(b)(1), unless the authorization is terminated or revoked.  Performed at [facility], [address], [place], [place] [address]   Urine Culture     Status: Abnormal   Collection  Time: 11/27/22  2:30 PM   Specimen: Urine, Clean Catch  Result Value Ref Range Status   Specimen Description   Final    URINE, CLEAN CATCH Performed at [facility], [address], [place], [place] [address]    Special Requests   Final    NONE Performed at [facility], [address], [place], [place] [address]    Culture MULTIPLE SPECIES PRESENT, SUGGEST RECOLLECTION (A)  Final   Report Status 11/28/2022 FINAL  Final  Blood culture (routine x 2)     Status: None   Collection Time: 11/27/22  3:22 PM   Specimen: BLOOD  Result Value Ref Range Status   Specimen Description   Final    BLOOD BLOOD RIGHT HAND Performed at [facility], [address], [place], [place] [address]    Special Requests   Final    BOTTLES DRAWN AEROBIC AND ANAEROBIC Blood Culture adequate volume Performed at [facility], [address] [place], [place] [address]    Culture   Final    NO GROWTH 6 DAYS Performed at [facility], [place] [address], [place], [place] [address]    Report Status 12/03/2022 FINAL  Final     Patient was seen and examined on the day of discharge and was found to be in stable condition. Time coordinating discharge: 40 minutes including assessment and coordination of care, as well as examination of the patient.   SIGNED:  Dessa Phi, DO [facility] 12/04/2022, 12:39 PM
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
## 2022-12-04 NOTE — Progress Notes (Signed)
Patient slept this shift. She did take her pills last night in apple sauce. This am patient had not urinated since last night. Bladder scanned her, got 46mls. Notified Doctor Josephine Cables. When going in to do in and out cath patient began to urinate the more we moved her. Charge Nurse in room. 271mls obtained without foley. Informed to hold off on in and out at this time. One time order in chart if needed.

## 2023-01-09 DEATH — deceased

## 2023-05-30 IMAGING — CT CT CERVICAL SPINE W/O CM
3 of 4 series · 12 of 33 positions shown, 14 images · non-contrast
Comparison: 07/10/2021

CLINICAL DATA: Multiple falls this week. Small abrasion along the
head. Dementia.

EXAM:
CT CERVICAL SPINE WITHOUT CONTRAST
TECHNIQUE: Multidetector CT imaging of the cervical spine was performed without
intravenous contrast. Multiplanar CT image reconstructions were also
generated.

[Series 4: sagittal bone · sagittal · 0.30mm/px · 5 of 59 slices shown, 6 images]
[im 20/59  bone]
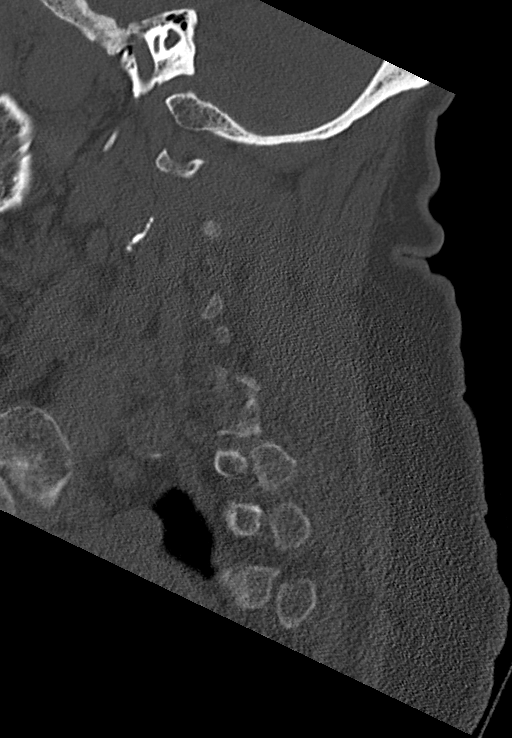
[im 25/59  bone]
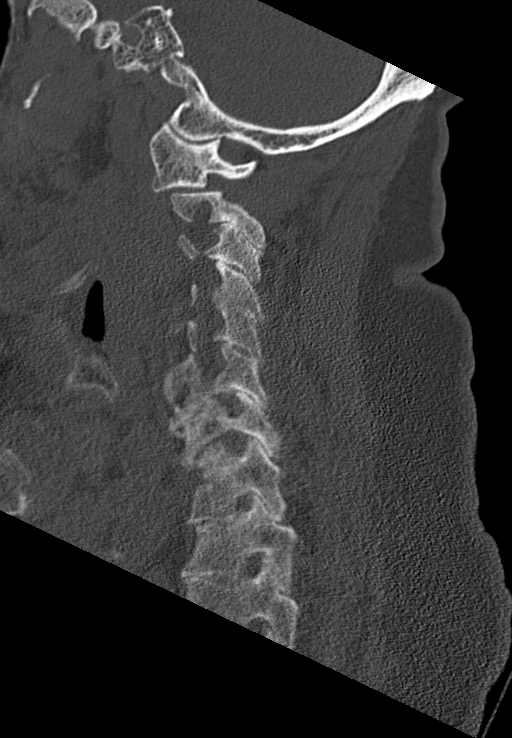
[im 30/59  soft-tissue]
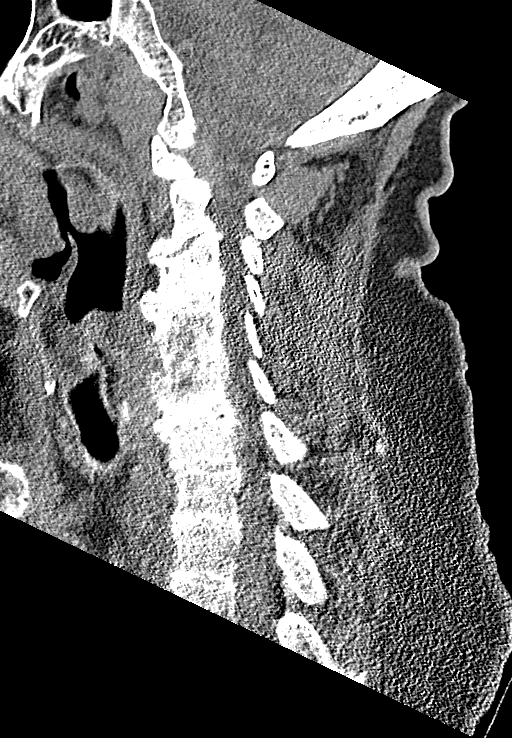
[im 30/59  bone]
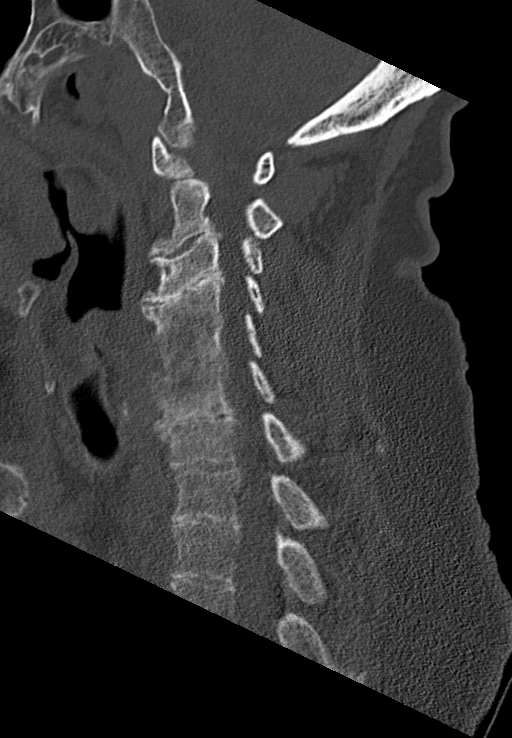
[im 34/59  bone]
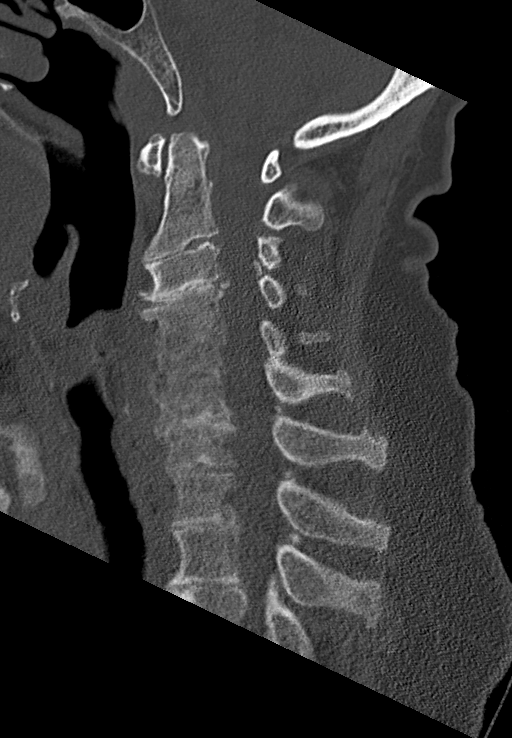
[im 39/59  bone]
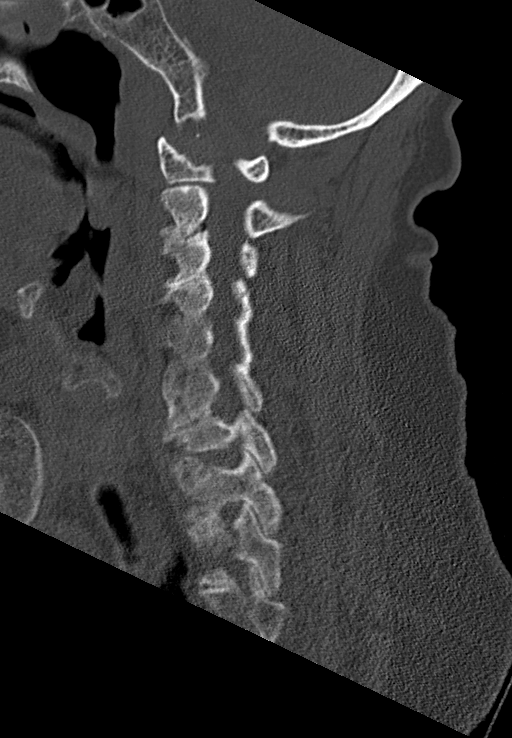

[Series 5: coronal bone · coronal · 0.28mm/px · 3 of 71 slices shown]
[im 21/71  bone]
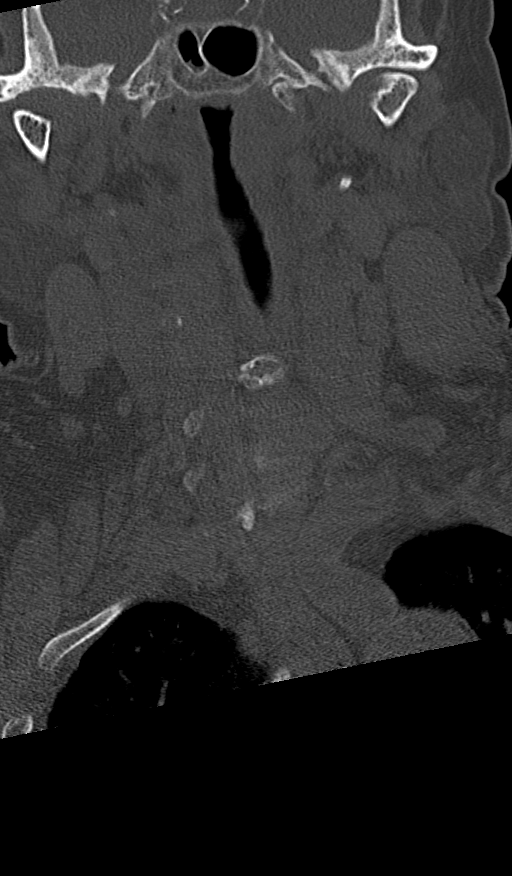
[im 31/71  bone]
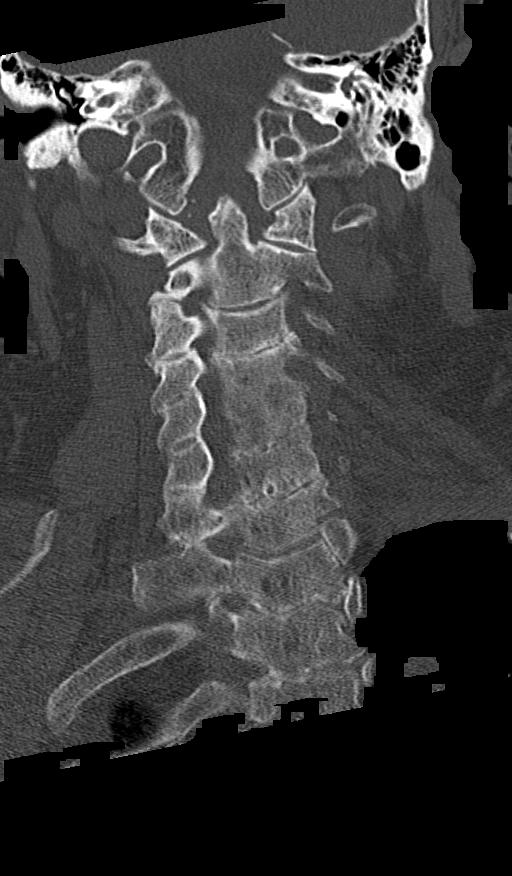
[im 40/71  bone]
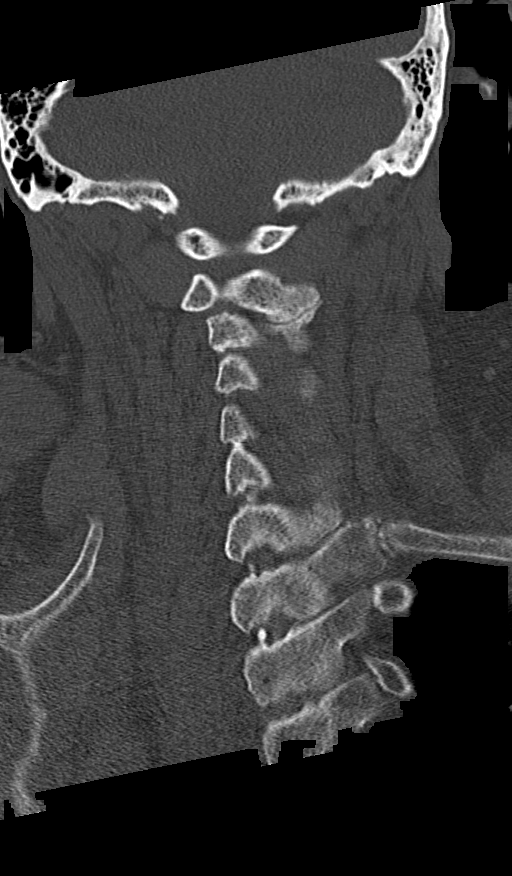

[Series 6: orthogonal bone · axial · 0.26mm/px · z∈[-301,-159]mm · 4 of 115 slices shown, 5 images]
[im 17/115  soft-tissue]
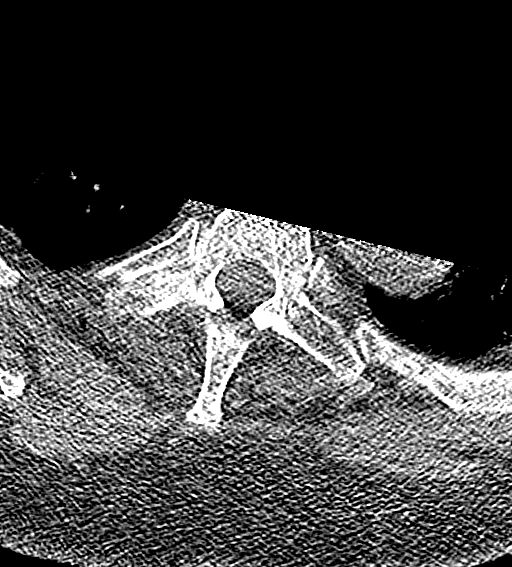
[im 17/115  bone]
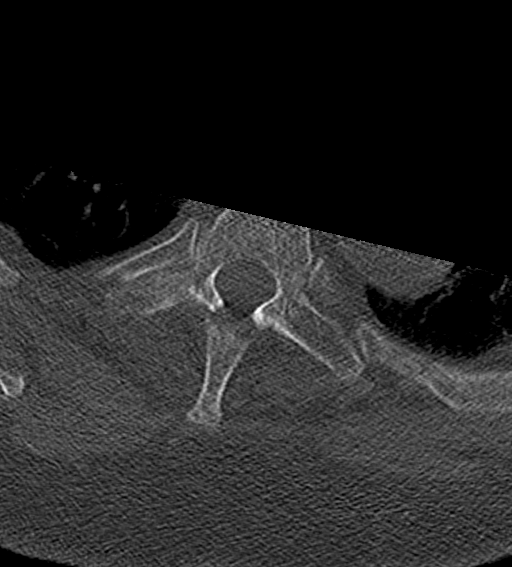
[im 49/115  bone]
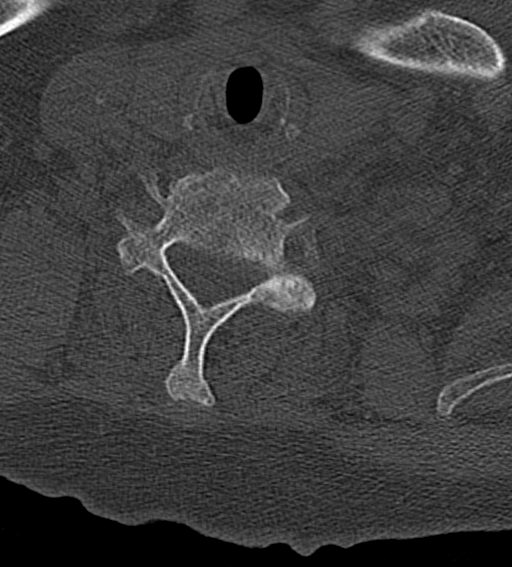
[im 66/115  bone]
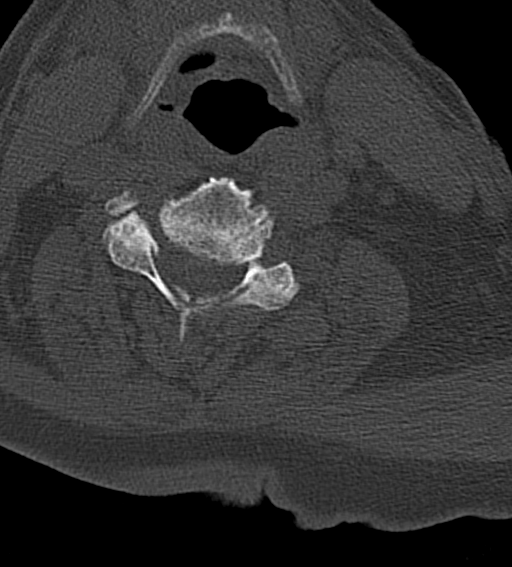
[im 98/115  bone]
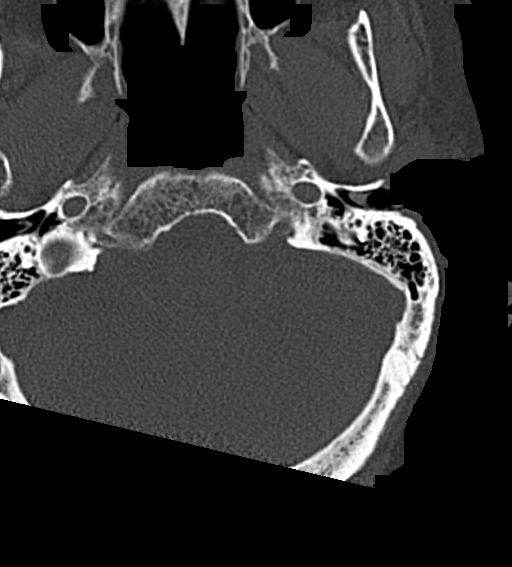

[12 of 33 positions shown; findings below may reference images not displayed]

FINDINGS: Alignment: Stable straightening of the cervical lordosis.

Skull base and vertebrae: Solid interbody fusion at C4-C5-C6
potentially with partial interbody fusion at C6-7. Notable loss of
intervertebral disc height at the remaining cervical vertebral
levels and in the upper thoracic spine. Fused facet joints at C4-5
bilaterally. I do not discern a cervical spine fracture or acute
bony abnormality.

Soft tissues and spinal canal: Atherosclerosis is present, including
the carotid arteries and aortic arch.

Disc levels: Uncinate and facet spurring contribute to osseous
foraminal stenosis on the left at C3-4, C4-5, C5-6, and C6-7; and on
the right at C3-4 and potentially T1-2. Posterior osseous ridging at
C[DATE] be contributing to mild central narrowing of the thecal
sac, unchanged.

Upper chest: 3 mm left apical pulmonary nodule on image 79 series 2,
no change from 07/10/2021 where this was part of scattered regional
nodularity both upper lobes.

Other: Degenerative sternoclavicular arthropathy bilaterally.
IMPRESSION: 1. No acute cervical spine findings.
2. Stable appearance of multilevel interbody fusion and multilevel
osseous foraminal narrowing due to spurring.
3.  Aortic Atherosclerosis ([id number]).

## 2023-05-30 IMAGING — CT CT HEAD W/O CM
3 series · 16 of 47 positions shown, 19 images · non-contrast
Comparison: 07/10/2021

CLINICAL DATA: Head trauma, multiple falls

EXAM:
CT HEAD WITHOUT CONTRAST
TECHNIQUE: Contiguous axial images were obtained from the base of the skull
through the vertex without intravenous contrast.

[Series 2: head wo · axial · 0.42mm/px · z∈[-128,-3]mm · 10 of 30 slices shown, 13 images]
[im 3/30  brain]
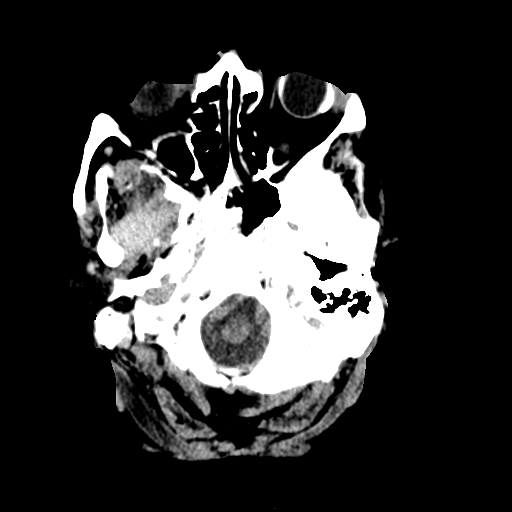
[im 3/30  bone]
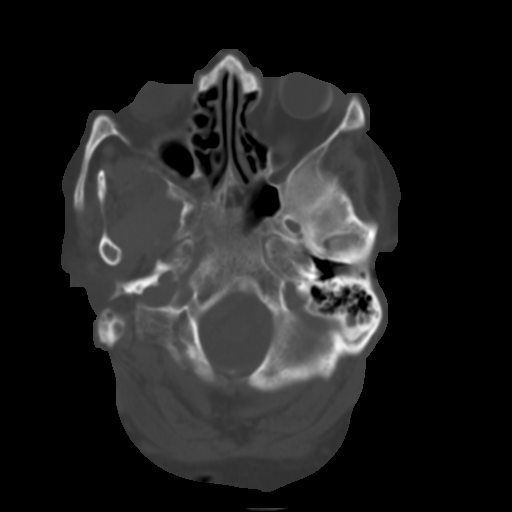
[im 6/30  brain]
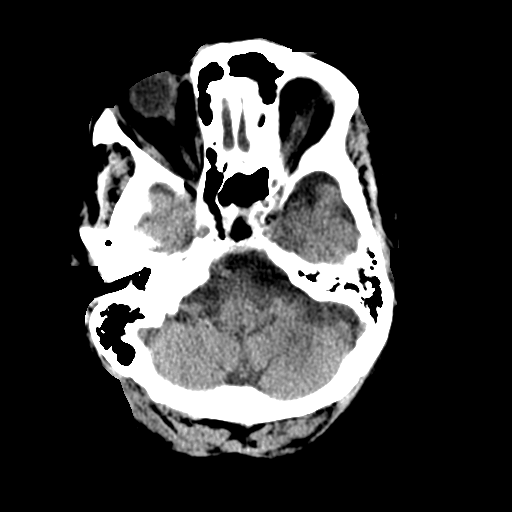
[im 9/30  brain]
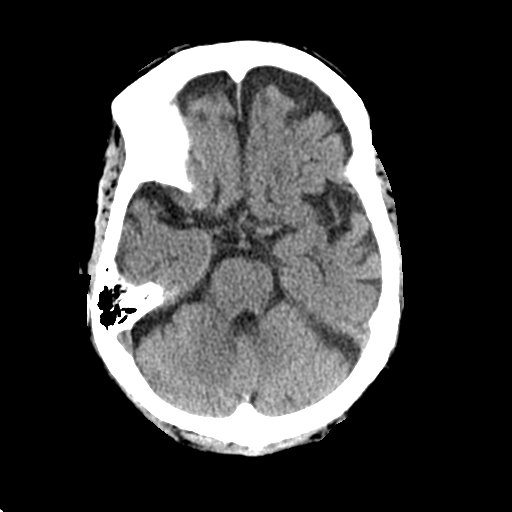
[im 11/30  brain]
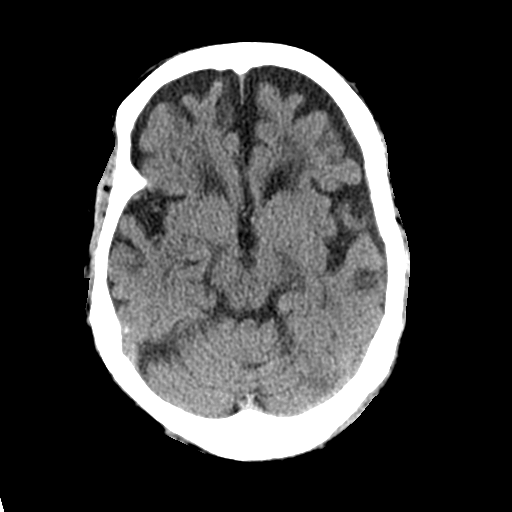
[im 14/30  brain]
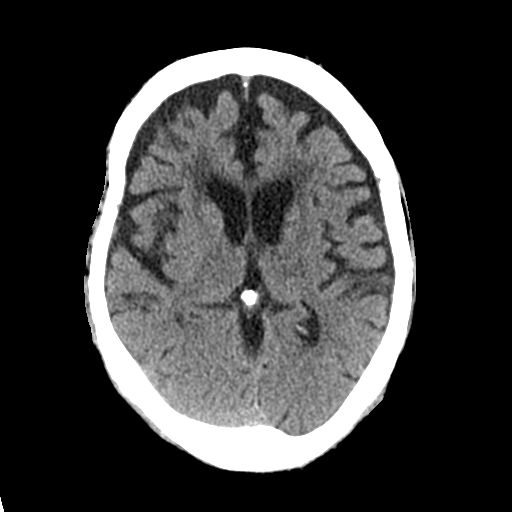
[im 14/30  bone]
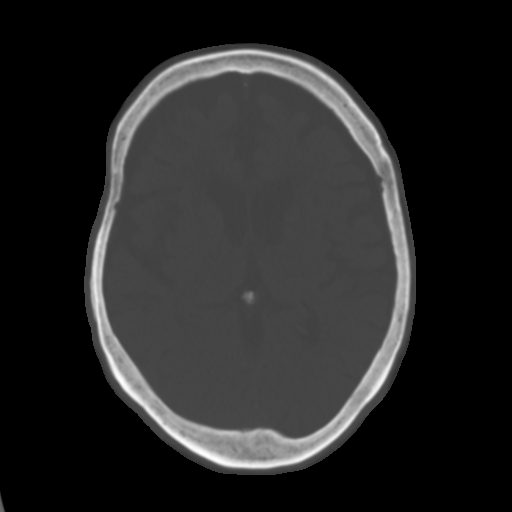
[im 17/30  brain]
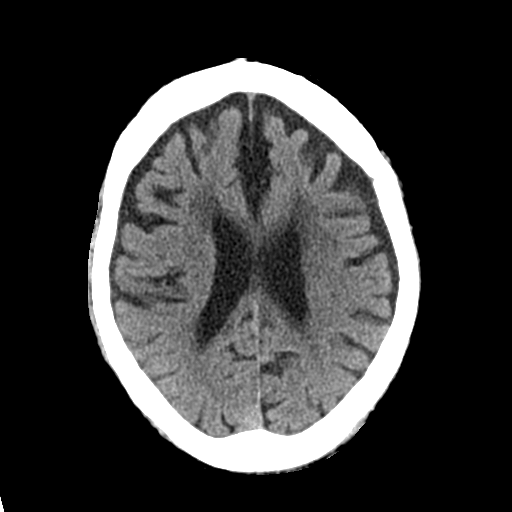
[im 20/30  brain]
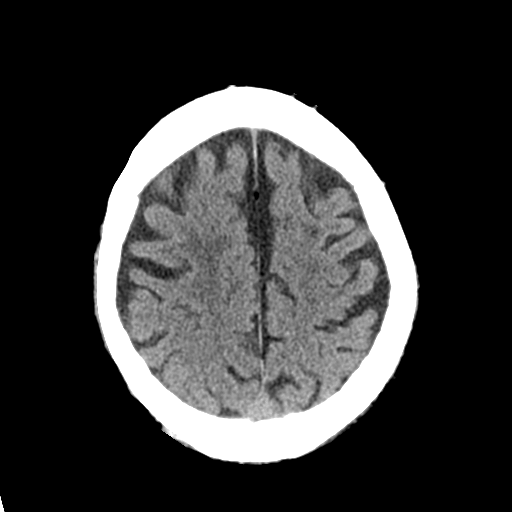
[im 23/30  brain]
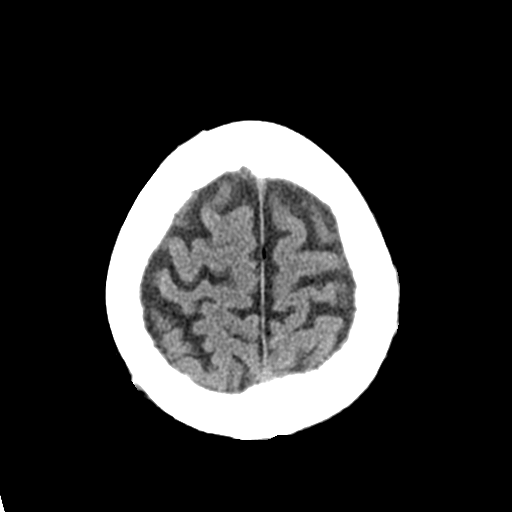
[im 25/30  brain]
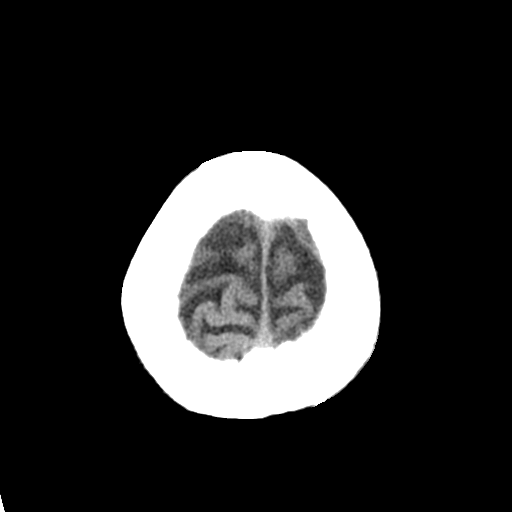
[im 25/30  bone]
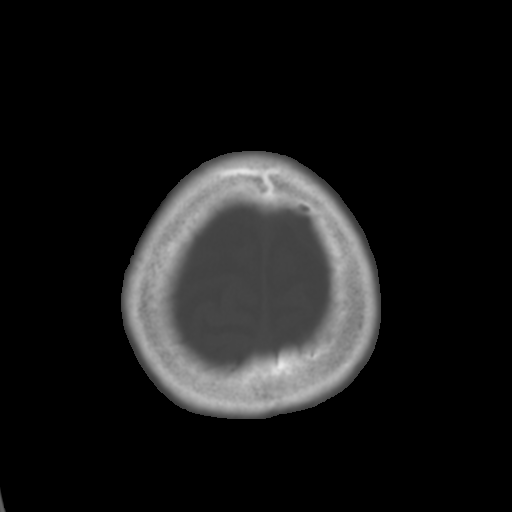
[im 28/30  brain]
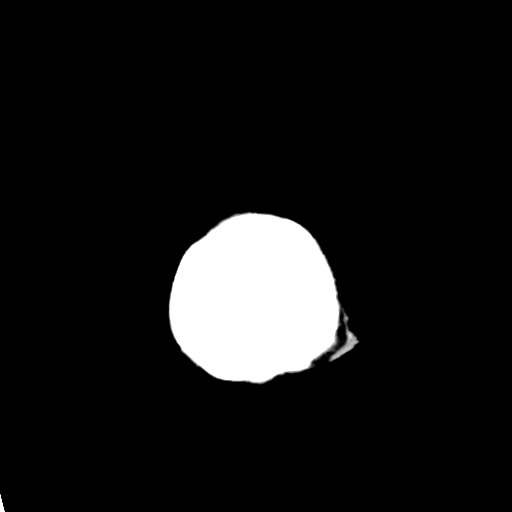

[Series 4: coronal soft tissue · coronal · 0.31mm/px · 3 of 60 slices shown]
[im 20/60  brain]
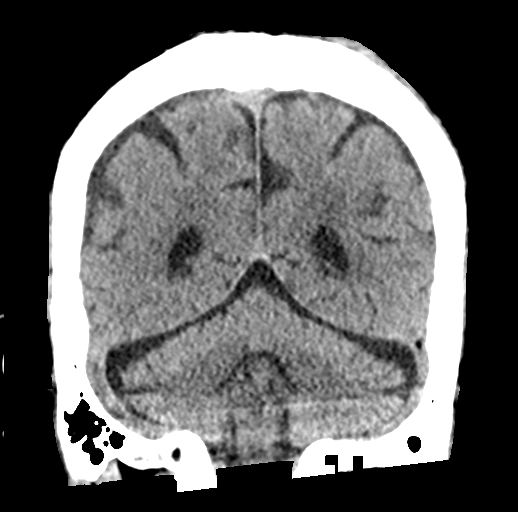
[im 27/60  brain]
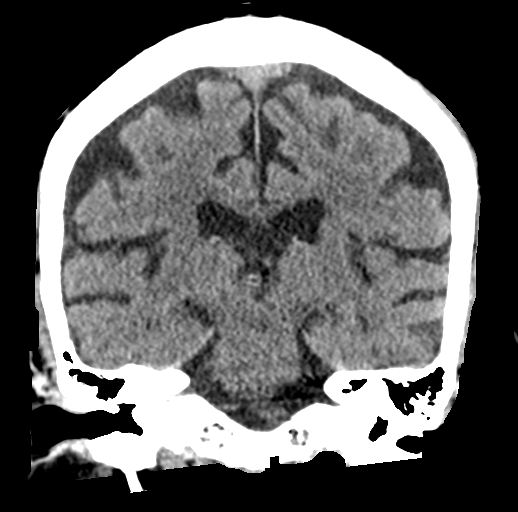
[im 33/60  brain]
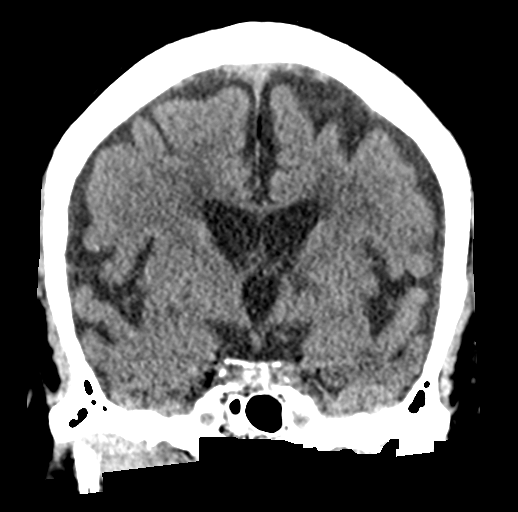

[Series 5: sagittal soft tissue · sagittal · 0.32mm/px · 3 of 49 slices shown]
[im 17/49  brain]
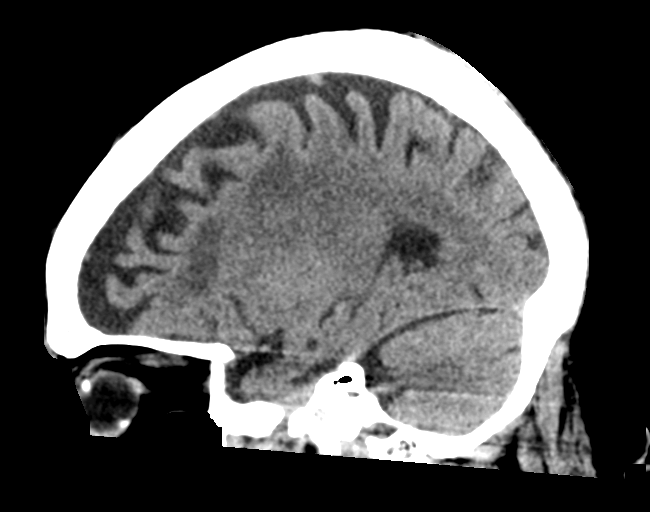
[im 25/49  brain]
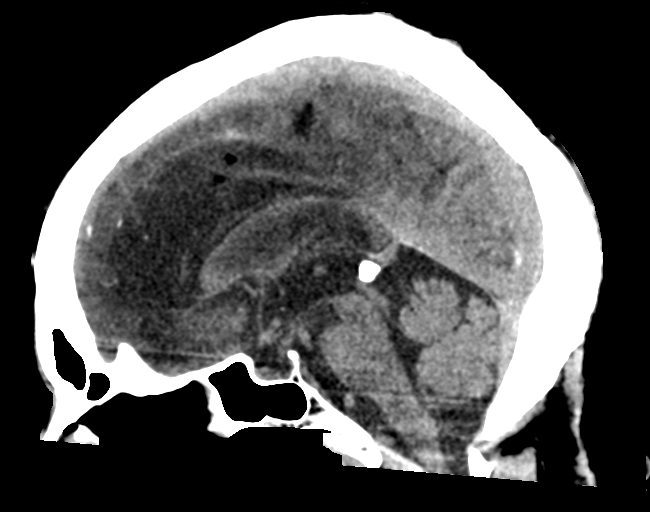
[im 33/49  brain]
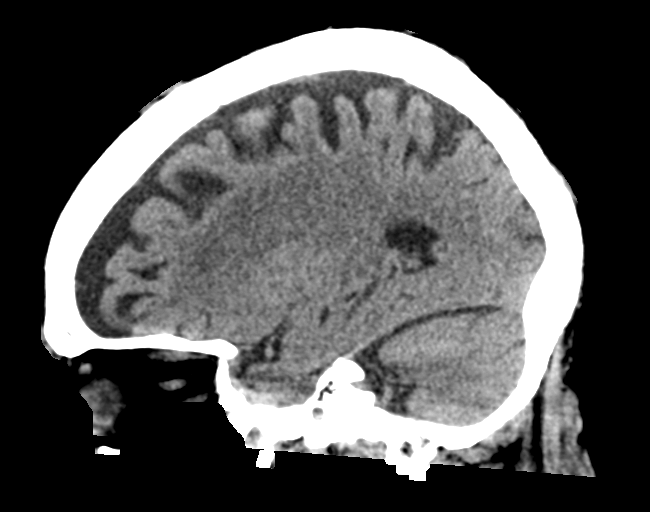

[16 of 47 positions shown; findings below may reference images not displayed]

FINDINGS: Brain: No evidence of acute infarction, hemorrhage, cerebral edema,
mass, mass effect, or midline shift. Ventricles and sulci are
concordant. No extra-axial fluid collection. Periventricular white
matter changes, likely the sequela of chronic small vessel ischemic
disease.

Vascular: No hyperdense vessel or unexpected calcification.

Skull: Remote bilateral lamina papyracea fractures. Negative for
acute fracture or focal lesion.

Sinuses/Orbits: Mucosal thickening in the ethmoid air cells. Status
post bilateral lens replacements. Left scleral band.

Other: The mastoid air cells are well aerated. Edema in the right
frontal scalp.
IMPRESSION: No acute intracranial process.
